# Patient Record
Sex: Female | Born: 1980 | Race: White | Hispanic: No | Marital: Single | Smoking: Never smoker
Health system: Western US, Academic
[De-identification: ages and names within clinical notes are randomized; demographics above are authoritative.]

## PROBLEM LIST (undated history)

## (undated) ENCOUNTER — Ambulatory Visit (HOSPITAL_BASED_OUTPATIENT_CLINIC_OR_DEPARTMENT_OTHER): Payer: Medicaid Other | Admitting: Anesthesiology

## (undated) ENCOUNTER — Ambulatory Visit (AMBULATORY_SURGERY_CENTER): Payer: Self-pay | Admitting: Physical Medicine and Rehab

## (undated) ENCOUNTER — Encounter (HOSPITAL_BASED_OUTPATIENT_CLINIC_OR_DEPARTMENT_OTHER): Payer: Self-pay

## (undated) ENCOUNTER — Inpatient Hospital Stay (HOSPITAL_COMMUNITY): Payer: Self-pay | Admitting: Orthopedics

## (undated) ENCOUNTER — Encounter (HOSPITAL_COMMUNITY): Payer: Self-pay

## (undated) DIAGNOSIS — S42009A Fracture of unspecified part of unspecified clavicle, initial encounter for closed fracture: Secondary | ICD-10-CM

## (undated) DIAGNOSIS — F988 Other specified behavioral and emotional disorders with onset usually occurring in childhood and adolescence: Secondary | ICD-10-CM

## (undated) HISTORY — DX: Fracture of unspecified part of unspecified clavicle, initial encounter for closed fracture: S42.009A

## (undated) HISTORY — PX: SPINE SURGERY: SHX786

## (undated) HISTORY — DX: Other specified behavioral and emotional disorders with onset usually occurring in childhood and adolescence: F98.8

## (undated) SURGERY — PAIN INJECTION, SACROILIAC JOINT
Laterality: Bilateral

## (undated) SURGERY — REMOVAL, HARDWARE, BACK
Anesthesia: General | Site: Spine Lumbar

## (undated) MED ORDER — LACTATED RINGERS IV SOLN
INTRAVENOUS | Status: AC
Start: 2019-10-26 — End: ?

## (undated) MED ORDER — HYDROCODONE-ACETAMINOPHEN 5-325 MG OR TABS
1.00 | ORAL_TABLET | Freq: Four times a day (QID) | ORAL | 0 refills | Status: AC | PRN
Start: 2018-04-14 — End: ?

## (undated) MED ORDER — OXYCODONE HCL 5 MG OR TABS
5.0000 mg | ORAL_TABLET | ORAL | Status: AC | PRN
Start: 2019-11-03 — End: ?

## (undated) MED ORDER — ACETAMINOPHEN 325 MG PO TABS
975.00 mg | ORAL_TABLET | Freq: Once | ORAL | Status: AC
Start: 2018-12-30 — End: 2018-12-30

## (undated) MED ORDER — HYDROMORPHONE HCL 1 MG/ML IJ SOLN
1.0000 mg | INTRAMUSCULAR | Status: AC | PRN
Start: 2019-11-03 — End: 2019-11-03

## (undated) MED ORDER — LIDOCAINE HCL (PF) 1 % IJ SOLN
0.1000 mL | INTRAMUSCULAR | Status: AC | PRN
Start: 2019-10-26 — End: ?

## (undated) MED ORDER — LIDOCAINE HCL (PF) 1 % IJ SOLN
0.10 mL | INTRAMUSCULAR | Status: AC | PRN
Start: 2018-12-30 — End: ?

## (undated) MED ORDER — OXYCODONE-ACETAMINOPHEN 5-325 MG OR TABS
1.00 | ORAL_TABLET | Freq: Four times a day (QID) | ORAL | 0 refills | Status: AC | PRN
Start: 2018-08-16 — End: ?

## (undated) MED ORDER — BUPROPION XL (DAILY) 150 MG OR TB24
ORAL_TABLET | ORAL | 3 refills | Status: AC
Start: 2019-10-24 — End: ?

## (undated) MED ORDER — MUPIROCIN 2 % EX OINT
1.0000 | TOPICAL_OINTMENT | Freq: Two times a day (BID) | CUTANEOUS | 0 refills | Status: AC
Start: 2019-10-31 — End: ?

## (undated) MED ORDER — CHLORHEXIDINE GLUCONATE 4 % EX LIQD (CUSTOM)
CUTANEOUS | 0 refills | Status: AC
Start: 2019-10-31 — End: ?

## (undated) MED ORDER — HYDROMORPHONE HCL 1 MG/ML IJ SOLN
0.5000 mg | INTRAMUSCULAR | Status: AC | PRN
Start: 2019-11-03 — End: 2019-11-03

## (undated) MED ORDER — OXYCODONE HCL 5 MG OR TABS
10.0000 mg | ORAL_TABLET | ORAL | Status: AC | PRN
Start: 2019-11-03 — End: ?

## (undated) MED ORDER — MELOXICAM 15 MG OR TABS
15.00 mg | ORAL_TABLET | Freq: Every day | ORAL | 0 refills | Status: AC
Start: 2018-05-18 — End: ?

## (undated) MED ORDER — TRIAMCINOLONE ACETONIDE 40 MG/ML IJ SUSP
4.00 mg | Freq: Once | INTRAMUSCULAR | Status: AC
Start: 2018-03-28 — End: 2018-03-28

## (undated) MED ORDER — TRAMADOL HCL 50 MG OR TABS
50.00 mg | ORAL_TABLET | Freq: Four times a day (QID) | ORAL | 0 refills | Status: AC | PRN
Start: 2018-04-12 — End: ?

## (undated) MED ORDER — BUPIVACAINE HCL (PF) 0.5 % IJ SOLN
1.00 mL | Freq: Once | INTRAMUSCULAR | Status: AC
Start: 2018-03-28 — End: 2018-03-28

## (undated) MED ORDER — ACETAMINOPHEN 325 MG PO TABS
975.0000 mg | ORAL_TABLET | Freq: Once | ORAL | Status: AC
Start: 2019-10-26 — End: 2019-10-26

## (undated) MED ORDER — TRAMADOL HCL 50 MG OR TABS
50.00 mg | ORAL_TABLET | Freq: Four times a day (QID) | ORAL | 0 refills | Status: AC | PRN
Start: 2018-03-31 — End: ?

## (undated) MED ORDER — OXYCODONE-ACETAMINOPHEN 5-325 MG OR TABS
1.00 | ORAL_TABLET | Freq: Four times a day (QID) | ORAL | 0 refills | Status: AC | PRN
Start: 2018-08-15 — End: ?

---
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
## 2013-04-06 ENCOUNTER — Encounter (INDEPENDENT_AMBULATORY_CARE_PROVIDER_SITE_OTHER): Payer: Self-pay | Admitting: Family Practice

## 2013-04-06 ENCOUNTER — Ambulatory Visit (INDEPENDENT_AMBULATORY_CARE_PROVIDER_SITE_OTHER): Payer: BLUE CROSS/BLUE SHIELD | Admitting: Family Practice

## 2013-04-06 VITALS — BP 112/75 | HR 73 | Temp 97.5°F | Resp 20 | Wt 164.0 lb

## 2013-04-06 MED ORDER — DROSPIRENONE-ETHINYL ESTRADIOL 3-0.02 MG OR TABS
1.0000 | ORAL_TABLET | Freq: Every day | ORAL | Status: DC
Start: 2013-04-06 — End: 2013-04-07

## 2013-04-06 NOTE — Progress Notes (Signed)
Chief Complaint   Patient presents with   . Refill Request     discuss birth control       Subjective:   32 yo female here for refill of OCP currently on yaz.     No blood clotting d/o, no hx of blood clots, no HA, no htn.      Has been on yaz for years, tried to switched but had side effects aware of increased risk of blood clots with yaz.       HCM:  Pap 2 years ago, no abnormals  tdap 2 years ago      PMH  ADD- started on Adderall      Review of Systems -   General - no fevers, chills, unintentional wt loss  Kidney - no changes in urination, blood in urine, pain with urination  Lungs - no cough, shortness of breath, bloody cough  Stomach - no bloody or black stools, abdominal pain, vomiting, diarrhea  Neurologic - no weakness, tingling, problems walking, fainting  Vascular - no leg pain with walking, painful hands/digits  Oral - no bleeding from gums, open sores  Heart - no chest pain, irregular heart beat  Psychiatric - no depression, behavior changes  Muscle - no joint pain, leg swelling  Ear/Nose/Throat - no nose bleeds  Skin - no rashes  Blood - no bleeding, bruising  Endocrine - no feeling hot or cold    No future appointments.    Past Medical History   Diagnosis Date   . Clavicular fracture    . ADD (attention deficit disorder)      History     Social History   . Marital Status: Single     Spouse Name: N/A     Number of Children: N/A   . Years of Education: N/A     Social History Main Topics   . Smoking status: Never Smoker    . Smokeless tobacco: Never Used   . Alcohol Use: Not on file   . Drug Use: Not on file   . Sexual Activity: Not Currently     Partners: Male     Other Topics Concern   . Not on file     Social History Narrative    Single    School fulltime-psychotherapy degree     Tour guide-food and history tours     Hobbies: beach volleyball, horse back riding, exercise      Family History   Problem Relation Age of Onset   . Diabetes Paternal Uncle    . Colon Cancer Neg Hx    . Breast Cancer  Paternal Aunt    . Breast Cancer Mother 28   . Hypertension Neg Hx    . Cholesterol/Lipid Disorder Neg Hx      Current Outpatient Prescriptions   Medication Sig   . drospirenone-ethinyl estradiol (YAZ) 3-0.02 MG per tablet Take 1 tablet by mouth daily.     No current facility-administered medications for this visit.       There is no immunization history on file for this patient.  No Known Allergies    OBJECTIVE  Filed Vitals:    04/06/13 0958   BP: 112/75   Pulse: 73   Temp: 97.5 F (36.4 C)   TempSrc: Oral   Resp: 20   Weight: 74.39 kg (164 lb)     There is no height on file to calculate BMI.  Physical Exam: General Appearance: healthy, alert, no distress, pleasant affect, cooperative.  Heart:  normal rate and regular rhythm, no murmurs, clicks, or gallops.  Lungs: normal respiratory rate and rhythm, lungs clear to auscultation.  Extremities:  no cyanosis, clubbing, or edema.      Makinze was seen today for refill request.    Diagnoses and associated orders for this visit:    Birth control-reviewed increased risk of blood clots with yaz, patient wishes to continue, Pt is a non-smoker, does not have elevated blood pressure, or personal/family history of coagulopathy, and is an appropriate candidate for hormonal contraception. Risks of OCP reviewed with patient - including HTN, stroke, and blood clot.   - Mychart Access Code Procedure  - drospirenone-ethinyl estradiol (YAZ) 3-0.02 MG per tablet; Take 1 tablet by mouth daily.    ADD (attention deficit disorder)-started on adderall, managed by outside dr.           Napoleon Form, MD    Barriers to learning none    Patient verbalizes understanding and agrees to plan

## 2013-04-06 NOTE — Interdisciplinary (Signed)
Pre-visit chart review and huddle completed with staff and physician.    Outstanding labs, imaging and consults reviewed and identified.    Health maintanence issues identified and addressed:    Health Maintenance   Topic Date Due    Imm_td/tdap=>32 Yo  12/29/1991    Cervical Cancer Screening  12/28/2001    Influenza Vaccine  06/07/2013

## 2013-04-07 ENCOUNTER — Encounter (INDEPENDENT_AMBULATORY_CARE_PROVIDER_SITE_OTHER): Payer: Self-pay | Admitting: Family Practice

## 2013-04-07 MED ORDER — DROSPIRENONE-ETHINYL ESTRADIOL 3-0.02 MG OR TABS
1.0000 | ORAL_TABLET | Freq: Every day | ORAL | Status: DC
Start: 2013-04-07 — End: 2015-12-17

## 2013-04-07 NOTE — Telephone Encounter (Signed)
From: [name]  To: Campbell Lerner, MD  Sent: 04/07/2013 10:28 AM PDT  Subject: 1-Non Urgent Medical Advice    Hi,    I do need to have my birth control pill as the generic otherwise it's very expensive. Could you change my prescription to allow for the generic?    Thanks!  Haley Barrett

## 2013-04-07 NOTE — Telephone Encounter (Signed)
Script re-pended with note to Pharmacy to dispense generic.

## 2013-04-12 ENCOUNTER — Encounter (INDEPENDENT_AMBULATORY_CARE_PROVIDER_SITE_OTHER): Payer: Self-pay | Admitting: Family Practice

## 2013-05-24 ENCOUNTER — Encounter (INDEPENDENT_AMBULATORY_CARE_PROVIDER_SITE_OTHER): Payer: BLUE CROSS/BLUE SHIELD | Admitting: Family Medicine

## 2013-06-15 ENCOUNTER — Other Ambulatory Visit: Payer: BLUE CROSS/BLUE SHIELD | Attending: Family Practice | Admitting: Family Medicine

## 2013-06-15 ENCOUNTER — Encounter (INDEPENDENT_AMBULATORY_CARE_PROVIDER_SITE_OTHER): Payer: Self-pay | Admitting: Family Medicine

## 2013-06-15 VITALS — BP 131/78 | HR 71 | Temp 97.3°F | Resp 14 | Ht 69.5 in | Wt 159.8 lb

## 2013-06-15 DIAGNOSIS — Z23 Encounter for immunization: Secondary | ICD-10-CM | POA: Insufficient documentation

## 2013-06-15 DIAGNOSIS — Z124 Encounter for screening for malignant neoplasm of cervix: Secondary | ICD-10-CM | POA: Insufficient documentation

## 2013-06-15 DIAGNOSIS — Z113 Encounter for screening for infections with a predominantly sexual mode of transmission: Secondary | ICD-10-CM | POA: Insufficient documentation

## 2013-06-15 NOTE — Progress Notes (Signed)
 Haley Barrett is a 32 year old female presents for WWE    HPI:  LMP: No LMP recorded. Patient is not currently having periods (Reason: Oral contraception). Spots during placebo week.  Menstrual history includes menarche 12 years. Has been on OCPs since age 65, hasn't had a normal period in several years due to OCPs.  Last pap smear: 2 year(s), and it was normal, she has had no procedures.  She is sexually active.    She is in a heterosexual relationship; with current partner for 2-3 months.  Number of lifetime sexual partners 10-15.    Past history of any  STDs yes - chlamydia--dx'd 1 month ago, she and partner were treated. Present form of contraception is oral contraceptives.    Health Concerns:  Was recently diagnosed with chlamydia about a month ago at [facility], was treated (and so was her partner) but she thinks she had sex earlier than she was told it was OK. Would like to be retested today.     Diet: eats chicken and [place] but mostly vegetarian, plenty of fruits/veggies, avoid sweets/soda/fatty snacks  Exercise: does zumba and weightlifting several times per week    Review of Systems -   Denies fever/sweats/chills, visual changes, headache, unexplained weight changes, chest pain, shortness of breath, nausea/vomiting/diarrhea/constipation, abdominal pain, dysuria, hematuria, vaginal discharge, numbness/paresthesias in limbs, hot flashes, headaches, peripheral edema, personal or family history of blood clots.     I did review available medical, surgical, obstetrical and social history:    Outpatient Prescriptions Prior to Visit   Medication Sig Dispense Refill   . drospirenone -ethinyl estradiol  (YAZ) 3-0.02 MG per tablet Take 1 tablet by mouth daily.  84 tablet  4     No facility-administered medications prior to visit.     History:  Patient Active Problem List   Diagnosis   . ADD (attention deficit disorder)   . Birth control     Current Outpatient Prescriptions on File Prior to Visit   Medication  Sig Dispense Refill   . drospirenone -ethinyl estradiol  (YAZ) 3-0.02 MG per tablet Take 1 tablet by mouth daily.  84 tablet  4     No current facility-administered medications on file prior to visit.     No Known Allergies  Family History   Problem Relation Age of Onset   . Diabetes Paternal Uncle    . Colon Cancer Neg Hx    . Breast Cancer Paternal Aunt    . Breast Cancer Mother 42   . Hypertension Neg Hx    . Cholesterol/Lipid Disorder Neg Hx      History     Social History   . Marital Status: Single     Spouse Name: N/A     Number of Children: N/A   . Years of Education: N/A     Occupational History   . Not on file.     Social History Main Topics   . Smoking status: Never Smoker    . Smokeless tobacco: Never Used   . Alcohol Use: Not on file   . Drug Use: Not on file   . Sexual Activity: Not Currently     Partners: Male     Other Topics Concern   . Not on file     Social History Narrative    Single    School fulltime-psychotherapy degree     Tour guide-food and history tours     Hobbies: beach volleyball, horse back riding, exercise  Objective:    BP 131/78  Pulse 71  Temp(Src) 97.3 F (36.3 C) (Oral)  Resp 14  Ht 5' 9.5" (1.765 m)  Wt 72.485 kg (159 lb 12.8 oz)  BMI 23.27 kg/m2    Body mass index is 23.27 kg/(m^2).    General: well Developed, well nourished female in no acute distress, mood & affect are normal  Head: normocephalic, atraumatic  Eyes: anicteric, conj and lids normal OU  Ears: External Canals normal and TM's normal AU  Nose: Mucus membranes normal  Oro: Uvula midline, tongue, gums, posterior pharynx & tonsils normal  Neck : supple, no anterior or posterior adenopathy, thyroid  exam without nodules or asymmetry, not enlarged  CV: S1S2 rate noted, regular, no murmur, no S3 or S4 gallop noted  Lungs: RR noted, able to speak in complete sentences, no respiratory distress, breath sounds symmetrical and normal bilaterally with good air exchange  Breasts: No Axillary, supra- or infraclavicular  adenopathy bilaterally, breasts, nipples & areolas without dominant masses, nipple discharge or skin changes. UOQ's with fibrocystic features  Abd:  Normoactive bowel sounds, soft, non-tender to palpation, no organomegaly, no guarding, no rebound  Pelvic: pap obtained, cultures done  Vulva/Vagina: normal  Cervix: normal in appearance  Uterus: normal sized  Adnexa: no adnexal masses or tenderness  Rectal Exam: not performed  MSkel:   Back: No spinal tenderness, no flank, or CVA tenderness  Neurological exam reveals normal without focal findings, mental status, speech normal, alert and oriented x iii, PERLA.  Skin: exposed areas free of rash and no significant lesions  Extr: no cyanosis, clubbing, or edema    Assessment/Plan:   Haley Barrett was seen today for physical.    Diagnoses and associated orders for this visit:    Screening for cervical cancer  - Cytopath Gyn (Pap Smear)    Routine screening for STI (sexually transmitted infection)  - Chlamydia/GC PCR, Genital Culturette    Influenza vaccine needed  - Flu Vaccine, IM >=3 Years      Patient was seen by and discussed with attending, Dr. Mirian Barrett    Return to clinic 1 year for [facility] or sooner prn.     Haley Brink, DO   [facility] PGY2

## 2013-06-15 NOTE — Interdisciplinary (Signed)
 Pre-visit chart review and huddle completed with staff and physician.    Outstanding labs, imaging and consults reviewed and identified.    Health maintanence issues identified and addressed:    Health Maintenance   Topic Date Due   . Cervical Cancer Screening  12/28/2001   . Influenza Vaccine  05/08/2013   . Imm_td/tdap=>32 Yo  03/07/2021       Patient denies any previous medication or vaccine reactions. After 10 minutes monitoring from medication administration, patient tolerated Flu vaccine well and left without incident. Vaccine information sheet provided to patient.

## 2013-06-15 NOTE — Progress Notes (Signed)
 Attending Note:    Subjective:  I reviewed the history.  Patient interviewed and examined.  History of present illness (HPI):  Physical     Review of Systems (ROS): As per  the resident's note.  Past Medical, Family, Social History:  As per  the resident's  note.    Objective:   I have examined the patient and I concur with the resident's exam.    Assessment and plan reviewed with the resident physician.  I agree with the resident's plan as documented.    See the resident's note for further details.

## 2013-06-17 NOTE — Procedures (Signed)
 SPECIMENS SUBMITTED:  Cervical (Pap) Smear;Thin-Prep Vial Received    CLINICAL INFORMATION:   No LMP Given; Other Clinical Findings: Screening  FINAL CYTOLOGIC INTERPRETATION:  No Atypical or Malignant Cells    COMMENT:  HPV Subtyping will be performed on excess Thin-Prep collection fluid from  the specimen vial.  Result will be reported in EPIC separately.    SPECIMEN ADEQUACY:  Satisfactory for evaluation  The Pap smear is a screening test with an inherent low error rate. Although  a normal [negative] result is highly predictive of the absence of cervical  cancer and its precursor lesions, it does not exclude the presence of  significant disease. Results must be evaluated within the context of the  individual patient.  CONFIDENTIAL HEALTH INFORMATION: Health Care information is personal and  sensitive information. If it is being faxed to you it is done so under  appropriate authorization from the patient or under circumstances that do  not require patient authorization. You, the recipient, are obligated to  maintain it in a safe, secure and confidential manner. Re-disclosure  without additional patient consent or as permitted by law is prohibited.  Unauthorized re-disclosure or failure to maintain confidentiality could  subject you to penalties described in federal and state law.  If you have  received this report or facsimile in error, please notify the [facility] immediately and destroy the received document(s).    Material reviewed and Interpreted and  Report Electronically Signed by:  Lawernce Presto CT (ASCP)  06/17/13 08:48  Electronic Signature derived from a single  controlled access password

## 2013-07-28 ENCOUNTER — Encounter (INDEPENDENT_AMBULATORY_CARE_PROVIDER_SITE_OTHER): Payer: Self-pay | Admitting: Family Practice

## 2014-03-28 ENCOUNTER — Encounter (INDEPENDENT_AMBULATORY_CARE_PROVIDER_SITE_OTHER): Payer: Self-pay | Admitting: Family Practice

## 2014-03-28 DIAGNOSIS — Z0000 Encounter for general adult medical examination without abnormal findings: Principal | ICD-10-CM

## 2015-12-02 ENCOUNTER — Other Ambulatory Visit: Payer: Self-pay

## 2015-12-05 ENCOUNTER — Telehealth (INDEPENDENT_AMBULATORY_CARE_PROVIDER_SITE_OTHER): Payer: Self-pay

## 2015-12-05 NOTE — Telephone Encounter (Signed)
Verbally confirmed name of Primary Care Provider: No PCP    New Referral request is to what department? Orthopedics  What is the reason for referral request? Right pinky finger broken, [facility], radiology report to be updated by [facility] Synetta Fail(Anita)  Last visit Date: none  Next visit Date: 12/17/15    Patient to establish care with Dr. Threasa BeardsYang on 12/17/15. Please advise.    If patient has not been seen in over a year please secure an appointment before forwarding message to nurse

## 2015-12-06 NOTE — Telephone Encounter (Signed)
[place], can you see if pt. Can be seen sooner for broken finger to be assessed for Referral to Orthopedics.

## 2015-12-10 NOTE — Telephone Encounter (Signed)
Called patient to offer sooner appointment on 12/12/15 at 1:40 PM with Dr. Glade LloydPandey.  Patient was unavailable at the time of call, detailed voice message was left with call back number for scheduling purposes.

## 2015-12-11 NOTE — Telephone Encounter (Signed)
Called patient to inform that sooner appointment on 12/12/15 at 1:40 PM with Dr. Glade LloydPandey was no longer available. Patient was unavailable at the time of call, detailed voice message was left with call back number for any questions she may have. Patient is currently scheduled with Dr. Threasa BeardsYang for 12/17/15 (patient aware).

## 2015-12-12 ENCOUNTER — Ambulatory Visit (INDEPENDENT_AMBULATORY_CARE_PROVIDER_SITE_OTHER): Payer: Medicaid Other | Admitting: Hospitalist

## 2015-12-17 ENCOUNTER — Encounter (INDEPENDENT_AMBULATORY_CARE_PROVIDER_SITE_OTHER): Payer: Self-pay | Admitting: Internal Medicine

## 2015-12-17 ENCOUNTER — Ambulatory Visit (INDEPENDENT_AMBULATORY_CARE_PROVIDER_SITE_OTHER): Payer: Medicaid Other | Admitting: Internal Medicine

## 2015-12-17 ENCOUNTER — Other Ambulatory Visit: Payer: Medicaid Other | Attending: Internal Medicine | Admitting: Internal Medicine

## 2015-12-17 VITALS — BP 121/71 | HR 74 | Temp 97.3°F | Ht 70.0 in | Wt 162.2 lb

## 2015-12-17 DIAGNOSIS — Z113 Encounter for screening for infections with a predominantly sexual mode of transmission: Principal | ICD-10-CM

## 2015-12-17 DIAGNOSIS — S62609A Fracture of unspecified phalanx of unspecified finger, initial encounter for closed fracture: Secondary | ICD-10-CM

## 2015-12-17 DIAGNOSIS — Z0000 Encounter for general adult medical examination without abnormal findings: Secondary | ICD-10-CM

## 2015-12-17 DIAGNOSIS — Z0189 Encounter for other specified special examinations: Secondary | ICD-10-CM

## 2015-12-17 DIAGNOSIS — Z309 Encounter for contraceptive management, unspecified: Secondary | ICD-10-CM

## 2015-12-17 LAB — LIPID(CHOL FRACT) PANEL, BLOOD
Cholesterol: 225 mg/dL — ABNORMAL HIGH (ref <200)
HDL-Cholesterol: 104 mg/dL
LDL-Chol (Calc): 99 mg/dL (ref <160)
Non-HDL Cholesterol: 121 mg/dL
Triglycerides: 112 mg/dL (ref 10–170)

## 2015-12-17 LAB — COMPREHENSIVE METABOLIC PANEL, BLOOD
ALT (SGPT): 30 U/L (ref 0–33)
AST (SGOT): 29 U/L (ref 0–32)
Albumin: 4.4 g/dL (ref 3.5–5.2)
Alkaline Phos: 62 U/L (ref 35–140)
Anion Gap: 19 mmol/L — ABNORMAL HIGH (ref 7–15)
BUN: 13 mg/dL (ref 6–20)
Bicarbonate: 21 mmol/L — ABNORMAL LOW (ref 22–29)
Bilirubin, Tot: 0.28 mg/dL (ref <1.20)
Calcium: 9.4 mg/dL (ref 8.5–10.6)
Chloride: 99 mmol/L (ref 98–107)
Creatinine: 0.83 mg/dL (ref 0.51–0.95)
GFR: >60 mL/min
Glucose: 109 mg/dL — ABNORMAL HIGH (ref 70–99)
Potassium: 3.9 mmol/L (ref 3.5–5.1)
Sodium: 139 mmol/L (ref 136–145)
Total Protein: 7.7 g/dL (ref 6.0–8.0)

## 2015-12-17 LAB — CBC WITH DIFF, BLOOD
ANC-Automated: 4.4 10*3/uL (ref 1.6–7.0)
Abs Eosinophils: 0.1 10*3/uL (ref 0.1–0.5)
Abs Lymphs: 3.1 10*3/uL (ref 0.8–3.1)
Abs Monos: 0.4 10*3/uL (ref 0.2–0.8)
Eosinophils: 1 %
Hct: 41.7 % (ref 34.0–45.0)
Hgb: 13.9 gm/dL (ref 11.2–15.7)
Lymphocytes: 39 %
MCH: 30.5 pg (ref 26.0–32.0)
MCHC: 33.3 % (ref 32.0–36.0)
MCV: 91.4 um3 (ref 79.0–95.0)
MPV: 9.2 fL — ABNORMAL LOW (ref 9.4–12.4)
Monocytes: 5 %
Plt Count: 316 10*3/uL (ref 140–370)
RBC: 4.56 10*6/uL (ref 3.90–5.20)
RDW: 12.4 % (ref 12.0–14.0)
Segs: 55 %
WBC: 8 10*3/uL (ref 4.0–10.0)

## 2015-12-17 LAB — GLYCOSYLATED HGB(A1C), BLOOD: Glyco Hgb (A1C): 5.1 % (ref 4.8–5.9)

## 2015-12-17 MED ORDER — ADDERALL PO
ORAL | Status: DC | PRN
Start: ? — End: 2019-10-26

## 2015-12-17 MED ORDER — AMBIEN PO
ORAL | Status: DC | PRN
Start: ? — End: 2017-03-01

## 2015-12-17 MED ORDER — DROSPIRENONE-ETHINYL ESTRADIOL 3-0.02 MG OR TABS
1.0000 | ORAL_TABLET | Freq: Every day | ORAL | 4 refills | Status: DC
Start: 2015-12-17 — End: 2016-05-05

## 2015-12-17 NOTE — Progress Notes (Signed)
ATTENDING NOTE:    Chief Complaint   Patient presents with   . New Patient   . Fracture     Right pinky       SUBJECTIVE:   I reviewed the history and interviewed the patient.    History of present illness:  Jackelyn HoehnMegan Vandervliet is a 35 year old female who presents to establish care. Recent right pinky finger injury during volleyball with resultant volar place fx involving right 5th middle phalanx.      Review of Systems: As per resident's note.    Past Medical, Family, Social History: Reviewed and updated as per resident note.    OBJECTIVE: BP 121/71  Pulse 74  Temp 97.3 F (36.3 C) (Oral)  Ht 5\' 10"  (1.778 m)  Wt 73.6 kg (162 lb 4 oz)  LMP 12/14/2015 (Approximate)  SpO2 100%  BMI 23.28 kg/m2    I have examined the patient and I concur with the resident's exam.    MEDICAL PLAN of CARE:   Assessment and plan reviewed with the resident physician.  I agree with the resident's plans as documented.    Ortho referral placed.   Screening labs ordered.   Pap due 2019.   Pt to check on Tdap status.     See the resident's note for further details.

## 2015-12-17 NOTE — Progress Notes (Signed)
Chief Complaint   Patient presents with   . New Patient   . Fracture     Right pinky         SUBJECTIVE::Haley Barrett is a 35 year old female with prior history of ADD here to establish care.      Was playing volleyball two weeks ago and injured her right pinky finger. Went to [facility] ED and had x-rays done which showed volar plate fracture with displacement involving the base of the fifth middle phalanx. She was told she would need to follow up with orthopedics and was referred to [place], but had trouble with her insurance getting an appointment, and needed to establish PCP first. Has been wearing splint for past two weeks. Initially, was very bruised, swollen and painful. Has improved over the last two weeks but still slightly discolored and painful.     No other complaints.     She has history of 11 broken bones, mostly from horse riding injuries. She previously was riding horses frequently and now only rides once a week. Currently, has not been riding horses after she fractured her pinky.     She would like STD testing today. Last had unprotected sex in February but has since broken up with that partner. Likes to be tested for STDs prior to getting a new partner. No symptoms. Currently on the pill.     [facility]:  Pap - last pap 2014. No history of abnormal.   tdap - 2012    Patient Active Problem List    Diagnosis Date Noted   . ADD (attention deficit disorder) 04/06/2013   . Birth control 04/06/2013       Past medical history, surgical history, and family history reviewed and updated today as below.  Allergies and medications reviewed and updated as below.    Past Medical History:   Diagnosis Date   . ADD (attention deficit disorder)    . Clavicular fracture        Past Surgical History:   Procedure Laterality Date   . ------------OTHER-------------      humerus ORIF        Family History   Problem Relation Age of Onset   . Diabetes Paternal Uncle    . Breast Cancer Paternal Aunt    . Breast Cancer Mother  61   . Breast Cancer Paternal Grandmother    . Colon Cancer Neg Hx    . Hypertension Neg Hx    . Cholesterol/Lipid Disorder Neg Hx        Social History   Substance Use Topics   . Smoking status: Never Smoker   . Smokeless tobacco: Never Used   . 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    Born in [place] and moved to [place] 6 years ago    Tour guide-food and history tours     Hobbies: beach volleyball, horse back riding, exercise      Obstetric History    G0   P0   T0   P0   A0   TAB0   SAB0   E0   M0   L0        No Known Allergies    Current Outpatient Prescriptions   Medication Sig   .  Amphetamine-Dextroamphetamine (ADDERALL PO) as needed.   . drospirenone-ethinyl estradiol (YAZ) 3-0.02 MG tablet Take 1 tablet by mouth daily.   . Zolpidem Tartrate (AMBIEN PO) as needed.     No current facility-administered medications for this visit.        ROS: Written review of systems reviewed with patient. Positive for findings noted below in bold:  Constitutional: negative for: fatigue, night sweats, weight loss, loss of energy, anorexia, fever.   Eyes: negative for: blurry vision, visual loss, red eyes, eye pain.   ENMT: negative for: hearing loss, ear pain, tinnitus, dental problems, odynophagia, change in voice or hoarseness  CV: negative for: palpitations, chest pain, paroxysmal nocturnal dyspnea, orthopnea, lower extremity edema, pain in calves with walking   Resp: negative for: cough, shortness of breath, pleuritic pain, wheezing, DOE  GI: negative for: heartburn, nausea, vomiting, abdominal pain, constipation, diarrhea, jaundice, change in stool caliber, rectal bleeding or black stools  Endo: negative for: cold intolerance, heat intolerance, polyphagia, polydipsia, polyuria.  GU: negative for: nocturia, frequency, dysuria, urgency, hesitancy,  hematuria, incontinence   Breast/GYN: negative for: vaginal discharge, pain with sex, loss of sexual desire, spotting between periods, excessive menstruation, nipple bleeding or discharge, tenderness in breast, menopausal symptoms  Musculoskeletal: negative for: back pain, muscle pain, joint pain, joint swelling, joint redness   Neuro: negative for: confusion, headaches, seizures, lightheadedness, memory loss, vertigo, paralysis/weakness, clumsiness, tremor, speech impairment.   Psych: negative for: anhedonia, depressed mood, crying spells, anxiety, trouble sleeping, hallucinations, impaired concentration, stress  Allergy/Immun: negative for: stuffy nose and sinuses, itchy/red/tearing eyes  Skin: no new skin rashes or lesions    OBJECTIVE:: BP 121/71  Pulse 74  Temp 97.3 F (36.3 C) (Oral)  Ht 5\' 10"  (1.778 m)  Wt 73.6 kg (162 lb 4 oz)  SpO2 100%  BMI 23.28 kg/m2  Physical Exam:   Gen: NAD, A&Ox3, tan, thin  HEENT: EOMI, PERRL, MMM, OP clear without erythema or exudates, no cervical LAD  CV: RRR, no m/r/g  Pulm: CTAB, no w/r/r  Abd: Soft, ND, NT, +BS, no masses, no organomegaly  Ext: WWP, no edema. Scar on L arm from prior surgery. R pinky finger in splint, TTP along medial edge, bluish discoloration at base of pinky on palmar side  Skin: No obvious lesions, rash  Neuro: CN II-XII grossly intact, strength/sensation grossly intact      Lab and imaging data:  Right hand x-ray 12/02/15 - [facility]  Mild comminuted and mildly displaced volar plate fracture involving the base of the fifth middle phalanx. No additional fracture or subluxation.       A/P: Haley MilletMegan was seen today for new patient, fracture and collect specimen.    Diagnoses and all orders for this visit:    Finger fracture, right, closed, initial encounter - Has mildly displaced volar plate fracture involving base of fifth middle phalanx. Has been improving with splinting/immobilization of finger, but will refer to orthopedics for further  evaluation. Will need repeat x-rays to ensure proper healing.   -     Orthopedics Clinic    Routine screening for STI (sexually transmitted infection)  -     Chlamydia/GC PCR, Urine Roche PCR Collection SystemTube; Future  -     HIV 1/2 Antibody Yellow serum separator tube; Future  -     Syphilis Screen, Blood Yellow serum separator tube; Future    Encounter for routine laboratory testing  -     CBC w/Diff Lavender; Future  -  Comprehensive Metabolic Panel Green Plasma Separator Tube; Future  -     Lipid Panel Green Plasma Separator Tube; Future  -     Glycosylated Hgb(A1C), Blood Lavender; Future    Encounter for contraceptive management, unspecified contraceptive encounter type  -     drospirenone-ethinyl estradiol (YAZ) 3-0.02 MG tablet; Take 1 tablet by mouth daily.      [facility]   -Flu shot: n/a  -Tetanus vaccine: 2012  -Pneumonia vaccine: n/a  -Shingles vaccine:n/a  -Pap smear: due 2019  -Mammogram: n/a  -colon cancer screening: n/a  -DEXA: n/a  -HPV vaccine: n/a  -Lung cancer screening:n/a    Pt was seen and discussed with attending, Dr. Renaldo Reel.  Follow-up in 1 year.    Deno Lunger MD, PGY2

## 2015-12-17 NOTE — Patient Instructions (Signed)
It was a pleasure meeting you today! Please contact me if you have any questions. The clinic number is [phone].    Here are a few of the topics that we discussed:    1. Get labs done  2. Schedule appointment with orthopedics    Please have your labs done on the 4th floor.  The lab is open:   Monday, Tuesday, Friday 7:45-4:45 PM   Wednesday, Thursday 7:30-4:45 PM  *For cholesterol panel, please be fasting (no food at least 8 hours prior, but water is ok).      Radiology is open weekdays until 4:30 PM.    ** If you are concerned about insurance coverage, costs and/or deductible, prior to doing the labs and/or tests please contact your insurance company and/or the lab. If you have high deductible or self-pay, then consider outside [facility] [place] labs like [facility] and [facility] to compare costs.    I look forward to working with you again.    Take care,  Deno LungerJenny Javell Blackburn, MD

## 2015-12-18 LAB — SYPHILIS EIA SCREEN, BLOOD: Syphilis EIA Screen: NEGATIVE

## 2015-12-18 LAB — HIV 1/2 ANTIBODY & P24 ANTIGEN ASSAY, BLOOD: HIV 1/2 Antibody & P24 Antigen Assay: NONREACTIVE

## 2015-12-22 ENCOUNTER — Encounter (INDEPENDENT_AMBULATORY_CARE_PROVIDER_SITE_OTHER): Payer: Self-pay | Admitting: Orthopaedic Surgery

## 2015-12-22 DIAGNOSIS — M79644 Pain in right finger(s): Principal | ICD-10-CM

## 2015-12-23 ENCOUNTER — Ambulatory Visit (INDEPENDENT_AMBULATORY_CARE_PROVIDER_SITE_OTHER): Payer: Medicaid Other | Admitting: Orthopaedic Surgery

## 2015-12-23 ENCOUNTER — Inpatient Hospital Stay (INDEPENDENT_AMBULATORY_CARE_PROVIDER_SITE_OTHER): Admit: 2015-12-23 | Discharge: 2015-12-23 | Disposition: A | Discharge status: Home / Self Care | Payer: Medicaid Other

## 2015-12-23 ENCOUNTER — Encounter (INDEPENDENT_AMBULATORY_CARE_PROVIDER_SITE_OTHER): Payer: Self-pay | Admitting: Orthopaedic Surgery

## 2015-12-23 VITALS — BP 118/76 | HR 72 | Temp 97.5°F

## 2015-12-23 DIAGNOSIS — S62626D Displaced fracture of medial phalanx of right little finger, subsequent encounter for fracture with routine healing: Secondary | ICD-10-CM

## 2015-12-23 DIAGNOSIS — S63259D Unspecified dislocation of unspecified finger, subsequent encounter: Principal | ICD-10-CM

## 2015-12-23 NOTE — Progress Notes (Signed)
Department of Orthopaedic Surgery  Division of Hand and Microvascular Surgery     HISTORY OF PRESENT ILLNESS  Haley Barrett is a 35 year old right-handed  woman who 3 weeks ago was playing volleyball and jammed her right small finger sustaining a PIP joint dislocation.  She was initially seen at [facility].  X-rays demonstrated a volar plate injury off of the proximal volar base of the middle phalanx of the right small finger.  She was splinted.  She is here today for her 1st follow-up and has worn her splint until today. She complains of swelling stiffness and pain.      PAST MEDICAL HISTORY  Patient Active Problem List    Diagnosis Date Noted   . Finger dislocation, subsequent encounter 12/23/2015   . ADD (attention deficit disorder) 04/06/2013   . Birth control 04/06/2013       PAST SURGICAL HISTORY  Past Surgical History:   Procedure Laterality Date   . ------------OTHER-------------  35 yo    humerus ORIF        MEDICATIONS    Amphetamine-Dextroamphetamine (ADDERALL PO) as needed.   drospirenone-ethinyl estradiol (YAZ) 3-0.02 MG tablet Take 1 tablet by mouth daily.   Zolpidem Tartrate (AMBIEN PO) as needed.     ALLERGIES  No Known Allergies    SOCIAL HISTORY  The patient has no children.  She lives alone.  She exercises daily.  She is not on a special diet, does not do drugs and is a nonsmoker.  She drinks alcohol 1-2 times per week.      FAMILY HISTORY  Family History   Problem Relation Age of Onset   . Diabetes Paternal Uncle    . Breast Cancer Paternal Aunt    . Breast Cancer Mother 67   . Breast Cancer Paternal Grandmother    . Colon Cancer Neg Hx    . Hypertension Neg Hx    . Cholesterol/Lipid Disorder Neg Hx         REVIEW OF SYSTEMS  A comprehensive review of systems questionnaire completed by the patient is negative except for the history of present illness.    PHYSICAL EXAMINATION  VITALS: BP 118/76  Pulse 72  Temp 97.5 F (36.4 C)  LMP 12/14/2015 (Approximate), There is no height or  weight on file to calculate BMI., Data Unavailable/10  GENERAL: A+Ox3.  Well appearing and well groomed.   MENTAL STATUS: Pleasant and cooperative.  UPPER EXTREMITY:  The examination is focused on the right small finger.  The right small finger is stiff in extension. It is swollen.  The PIP joint is globally tender.  The PIP joint is stable.  The central slip and terminal tendon as well as FDP are all intact. Skin and neurovascular examinations are grossly intact.    IMAGING STUDIES  Radiographs of the right small finger demonstrate the PIP joint is reduced and that there is a small volar plate avulsion fracture not involving the joint.    IMPRESSION AND PLAN  Three weeks status post right small finger PIP joint dislocation associated with a volar plate avulsion fracture of the proximal base of the middle phalanx.  The bony injury is structurally trivial.  The patient is swollen and stiff.  She was educated with regard to home exercise program and precautions.  She was given a prescription for therapy.  She will continue working on range of motion. She will follow up with her primary care physician.  Unfortunately due to the soft  tissue injury that occurs with PIP joint dislocations, these injuries, though seemingly trivial, can frequently take as long as 6-8 months to be pain-free and fully functional.

## 2015-12-24 ENCOUNTER — Telehealth (INDEPENDENT_AMBULATORY_CARE_PROVIDER_SITE_OTHER): Payer: Self-pay

## 2015-12-24 LAB — CHLAMYDIA/GONORRHEA PCR, URINE
Chlamydia trachomatis PCR, Urine: NEGATIVE
Neisseria gonorrhoeae PCR, Urine: NEGATIVE

## 2015-12-24 NOTE — Telephone Encounter (Signed)
I have attempted to reach the patient and schedule a therapy appointment. I was able to leave a message and provide our main line [phone], so the patient may call us back to schedule at their earliest convenience.

## 2016-01-20 ENCOUNTER — Telehealth (HOSPITAL_BASED_OUTPATIENT_CLINIC_OR_DEPARTMENT_OTHER): Payer: Self-pay

## 2016-01-20 NOTE — Telephone Encounter (Signed)
Left vm for patient to conf appt for OT Therapy tomorrow Tue 01/21/16. Left a detailed msj with appt's date/time/location. If any questions, or need to cxl this apapt to please contact us at ph [phone].

## 2016-01-21 ENCOUNTER — Ambulatory Visit
Admission: RE | Admit: 2016-01-21 | Discharge: 2016-01-21 | Disposition: A | Discharge status: Home / Self Care | Payer: Medicaid Other | Attending: Orthopaedic Surgery | Admitting: Orthopaedic Surgery

## 2016-01-21 DIAGNOSIS — M25641 Stiffness of right hand, not elsewhere classified: Principal | ICD-10-CM | POA: Insufficient documentation

## 2016-01-21 DIAGNOSIS — S63259D Unspecified dislocation of unspecified finger, subsequent encounter: Secondary | ICD-10-CM | POA: Insufficient documentation

## 2016-01-21 NOTE — Interdisciplinary (Signed)
Occupational Therapy Hand Therapy Evaluation        Preferred Language:English    History  History  Start of Care: 01/21/16  Mechanism of Injury : Trauma  Reason for Referral: Evaluate and Treat  History of Current Condition: pt is a RHD 35 y.o. female who sustained a volar plate avulsion fracture R SF while playing volley ball in March 2017. Pt was initially splinted post injury. Pt now with swelling and stiffness R SF. Pt was referred to OT Hand Therapy for eval and ROM  Results of Diagnostic Tests: volar plate avulsion fx base of middle phalanx R SF  Social History: single  Prior Level of Function: Active    Objective  Pain Score (0-10): 3  Pain Location: R SF   Type of Eval: Evaluation- low complexity ([id number])  ADLs: Independent DIfficulty using her R hand to grip and twist objects. Pt also reports catching her R SF while reaching into pant pockets.    Sensation: light touch intact R digits    Edema  R SF PIP=5.5cm/ L SF PIP=5.0cm    AROM  R SF MP= 0-85 PIP= 15-45 DIP=0-25 DPC=4.5cm    Treatment Today   pt seen after eval for MHP; STM; P/AA/ARom ; IP blocking; tendon gliding and soft theraplast exercises. Pt was instructed on a HEP. R SF fitted with a digi sleeve at end of tx session.          Plan  The goals listed below are achievable and realistic within the designated time frame and the treatments listed below, and referred to in the treatment plan, are necessary to achieve these goals. The functional goals were created based on the patient's prior level of function.    Treatment Plan  Diagnosis: s/p PIP dislocation R SF  Assessment: pt with limited ROM R SF due to swelling; joint stiff and intrinsic tightness. Recommend OT Hand Therapy to maxmimize ROM; increase strength and promote full functional use of pt's R dominant hand  Problems: Pain;Decreased ROM;Decreased strength (edema R SF)  Patient Stated Goal: increase ROM and R hand strength  Long Term Goal: pt to report full fucntional use of her R dominant  hand  Projected Completion date: 8 visits  Short Term Goal #1: pt to be independent in a HEP  # Visits: 1-3  Short Term Goal #2: pt to increase active DPC R SF to 1.0cm   # Visits: 5-7  Interventions: HEP instruction;Therapeutic exercises;Functional activities  Duration of Treatment: 6-8 weeks  Patient/Family Teaching: Completed with patient/family/ caregiver  Response to Therapy : Good    Total Treatment Time (min): 45  Total Timed Treatment (min) : 45

## 2016-02-07 ENCOUNTER — Ambulatory Visit
Admission: RE | Admit: 2016-02-07 | Discharge: 2016-02-09 | Disposition: A | Discharge status: Home / Self Care | Payer: Medicaid Other | Attending: Orthopaedic Surgery | Admitting: Orthopaedic Surgery

## 2016-02-07 DIAGNOSIS — M25641 Stiffness of right hand, not elsewhere classified: Principal | ICD-10-CM | POA: Insufficient documentation

## 2016-02-09 NOTE — Interdisciplinary (Signed)
Occupational Therapy Hand Treatment Note        Preferred Language:English    Subjective  Status Since Last Visit: Pt reports she has resumed playing volleyball, is taping her SF to her RF to try to avoid re-injury. Pt also reports continued SF stiffness.  Pain Score (0-10): 3  Pain Location: R SF     Treatment Today     Medi-Cal  Medi-Cal Treatment [x3908]: Initial 30 minutes       Objective     Objective Findings: Paraffin plus MHP R hand. Tendon glides, instrinsic stretching, place and hold, lumbrical press. Joint blocking, A/AA/PROM to facilitate R SF PIP AROM. Post tx, active DPC 3 CM,.     Assessment  Assessment : Improved R SF AROM post treatment, however moderate SF stiffness remains.     Plan  Patient to continue therapy for     ICD-10-CM ICD-9-CM    1. Stiffness of finger joint, right M25.641 719.54      Focus for Next Treatment: Manual therapy;Therapeutic exercises;Skilled ROM;Modalities;HEP instruction  Frequency of Treatment: 1x wk    Total Treatment Time (min): 30  Total Timed Treatment (min) : 30

## 2016-02-14 ENCOUNTER — Telehealth (INDEPENDENT_AMBULATORY_CARE_PROVIDER_SITE_OTHER): Payer: Self-pay | Admitting: Internal Medicine

## 2016-02-14 ENCOUNTER — Ambulatory Visit (HOSPITAL_BASED_OUTPATIENT_CLINIC_OR_DEPARTMENT_OTHER)
Admit: 2016-02-14 | Discharge: 2016-02-15 | Payer: Medicaid Other | Admitting: Rehabilitative and Restorative Service Providers"

## 2016-02-14 DIAGNOSIS — M25641 Stiffness of right hand, not elsewhere classified: Principal | ICD-10-CM

## 2016-02-14 NOTE — Telephone Encounter (Signed)
[facility] Summer for [facility].

## 2016-02-14 NOTE — Telephone Encounter (Signed)
Symptom Triage        Name of PCP Provider: Leane PlattYang, Jenny Zhu   Insurance Coverage Verified: Active  Last office visit: 12/17/2015  Next office visit:  Visit date not found    Who is reporting the symptoms? Patient  What symptom is the patient experiencing? Back pain rates pain (sometimes 8-9/10), currently 3 or 4/10 at this time    Is this a new or ongoing symptom? new  Estimated time since experiencing symptom(s)? At lease 4-5 months    Best way to contact patient: [phone] home and [phone] (mobile)   Alternative communication method: [phone] (mobile)     Has been advised this message with symptoms will be transmitted to triage nurse. And to please let us know if symptoms worsen or change, or proceed to the nearest emergency department or call 911.

## 2016-02-15 NOTE — Interdisciplinary (Signed)
Occupational Therapy Hand Treatment Note        Preferred Language:English    Subjective  Status Since Last Visit: Pt reports pain in her R SF continues to  limit her ability to complete her ROM exercises.   Pain Score (0-10): 4  Pain Location: R SF     Treatment Today     Medi-Cal  Medi-Cal Treatment [x3908]: Initial 30 minutes       Objective     Objective Findings: Paraffin plus MHP R hand. Tendon glides, instrinsic stretching, place and hold, lumbrical press. Joint blocking, A/AA/PROM to facilitate R SF PIP AROM. Post tx AROM R SF MP 0-95*, PIP 5-*60*, DIP 0*-30*.  Post tx active DPC 3 cm, passive DPC 2 cm.    Assessment  Assessment : Moderate R SF stiffness and mild swelling remain, unable to progress PROM 2/2 pain.    Plan  Patient to continue therapy for     ICD-10-CM ICD-9-CM    1. Stiffness of finger joint, right M25.641 719.54      Focus for Next Treatment: Therapeutic exercises;Modalities;Skilled ROM  Frequency of Treatment: 1x wk    Total Treatment Time (min): 30  Total Timed Treatment (min) : 30

## 2016-02-17 NOTE — Telephone Encounter (Signed)
Returned call to pt. Left voice message for patient with a call back number for the clinic.

## 2016-02-18 ENCOUNTER — Ambulatory Visit (HOSPITAL_BASED_OUTPATIENT_CLINIC_OR_DEPARTMENT_OTHER)
Admit: 2016-02-18 | Discharge: 2016-02-18 | Payer: Medicaid Other | Admitting: Rehabilitative and Restorative Service Providers"

## 2016-02-18 DIAGNOSIS — M25641 Stiffness of right hand, not elsewhere classified: Principal | ICD-10-CM

## 2016-02-18 NOTE — Telephone Encounter (Signed)
Second attempt to contact pt. Left voice message for patient with a call back number for the clinic. Informed pt to call if patient would like to schedule an appointment or talk to the triage nurse.

## 2016-02-18 NOTE — Telephone Encounter (Signed)
Appointment noted. Routing to Dr. Joslyn HyPrakash for FYI.

## 2016-02-18 NOTE — Interdisciplinary (Signed)
Occupational Therapy Hand Treatment Note        Preferred Language:English    Subjective  Status Since Last Visit: Pt reports continued pain in her R SF which hinders her ability to complete P/AROM exercises.   Pain Score (0-10): 6  Pain Location: R SF with PIP ROM.    Treatment Today     Medi-Cal  Medi-Cal Treatment [x3908]: Initial 30 minutes       Objective     Objective Findings: Paraffin plus MHP R hand. Tendon glides, instrinsic stretching, place and hold, lumbrical press. Joint blocking, A/AA/PROM to facilitate R SF PIP AROM. Oval 8 size 7 issued for use HS. Post tx AROM R SF MP 0-95*, PIP 5-*65*, DIP 0*-35*.  Post tx active DPC 3 cm, passive DPC 2 cm.    Assessment  Assessment : Slight improvement with PIP/DIP AROM, active DPC remains unchanged. Pain continues to limit her ROM HEP.    Plan  Patient to continue therapy for     ICD-10-CM ICD-9-CM    1. Stiffness of finger joint, right M25.641 719.54      Focus for Next Treatment: Therapeutic exercises;Manual therapy;Skilled ROM;Modalities  Frequency of Treatment: 1x wk    Total Treatment Time (min): 30  Total Timed Treatment (min) : 30

## 2016-02-18 NOTE — Telephone Encounter (Signed)
Pt called in and made an apt with Dr Orvan FalconerPrakash Tues 06/20 at 330 pm

## 2016-02-25 ENCOUNTER — Ambulatory Visit (HOSPITAL_BASED_OUTPATIENT_CLINIC_OR_DEPARTMENT_OTHER)
Admit: 2016-02-25 | Discharge: 2016-02-26 | Payer: Medicaid Other | Admitting: Rehabilitative and Restorative Service Providers"

## 2016-02-25 ENCOUNTER — Ambulatory Visit (INDEPENDENT_AMBULATORY_CARE_PROVIDER_SITE_OTHER): Payer: Medicaid Other | Admitting: Infectious Disease

## 2016-02-25 VITALS — BP 110/64 | HR 58 | Temp 98.9°F | Resp 14 | Ht 70.0 in | Wt 166.0 lb

## 2016-02-25 DIAGNOSIS — M25641 Stiffness of right hand, not elsewhere classified: Principal | ICD-10-CM

## 2016-02-25 DIAGNOSIS — G8929 Other chronic pain: Secondary | ICD-10-CM

## 2016-02-25 DIAGNOSIS — S63259A Unspecified dislocation of unspecified finger, initial encounter: Secondary | ICD-10-CM

## 2016-02-25 DIAGNOSIS — M545 Low back pain, unspecified: Secondary | ICD-10-CM

## 2016-02-25 NOTE — Progress Notes (Signed)
ATTENDING NOTE:    Chief complaint: Back pain    SUBJECTIVE:  I reviewed the history and interviewed the patient.    History of present illness: Haley Barrett is a 35 year old woman with ADD who presents with back pain.    Endorses 6 months of intermittent lower back pain.  Denies any obvious inciting trauma.  Is normally quite active at baseline.  Pain seems to be most localized at SI joints.  Worse after running but sometimes better after activity.  Denies associated rashes, joint pain, morning stiffness, etc.  Has tried ibuprofen PRN and ice packs, which both help.    Review of Systems: As per resident's note.    Past Medical, Family, Social History: Reviewed and updated as per resident note.    OBJECTIVE: BP 110/64 (BP cuff size: Regular)  Pulse 58  Temp 98.9 F (37.2 C) (Oral)  Resp 14  Ht 5\' 10"  (1.778 m)  Wt 75.3 kg (166 lb)  SpO2 100%  BMI 23.82 kg/m2    Well-appearing, non-toxic, NAD  TTP lumbar paraspinal area  Negative bilateral straight leg raise    I have examined the patient and I concur with the resident's exam.    MEDICAL PLAN of CARE:  Assessment and plan reviewed with the resident physician.  I agree with the resident's plans as documented.    #Back pain: Seems most likely musculoskeletal in etiology.  Denies any inciting trauma or radiculopathy.      - X-ray lumbar spine   - PT referral   - Continue NSAIDs  - Alternating ice and heat    See the resident's note for further details.    Gertie BaronJeffrey Flara Storti, MD, MPH  Internal Medicine Attending

## 2016-02-25 NOTE — Progress Notes (Signed)
Chief Complaint   Patient presents with   . Back Pain     x 6 months increasing.         SUBJECTIVE::Haley Barrett is a 35 year old female who presents to the clinic today for ACUTE VISIT    1. Back Pain - ongoing for about 6 months .   Bilateral back pain, worse when standing or more active, laying flat on back makes pain worse  Improved when she lies on her side. Ice makes the pain a little better. Massages helps the same. Advil helps symptoms.   Play voleyball, riding works out and lifts weights once a week  No vision, no joint pains, no rashes, no diarrhea/constipation    Family History   Mother - OA 58(60s)  No autoimmune disorders in the family    ROS    Patient Active Problem List    Diagnosis Date Noted   . Finger dislocation, subsequent encounter 12/23/2015   . ADD (attention deficit disorder) 04/06/2013   . Birth control 04/06/2013       Medications:  Current Outpatient Prescriptions   Medication Sig   . Amphetamine-Dextroamphetamine (ADDERALL PO) as needed.   . drospirenone-ethinyl estradiol (YAZ) 3-0.02 MG tablet Take 1 tablet by mouth daily.   . Zolpidem Tartrate (AMBIEN PO) as needed.     No current facility-administered medications for this visit.        Allergies:  No Known Allergies    Physical Exam:   OBJECTIVE: BP 110/64 (BP cuff size: Regular)  Pulse 58  Temp 98.9 F (37.2 C) (Oral)  Resp 14  Ht 5\' 10"  (1.778 m)  Wt 75.3 kg (166 lb)  SpO2 100%  BMI 23.82 kg/m2     Physical Exam     Gen: well nourished, well groomed F in NAD, AAOX3  Back: pain on flexion midline L2-L4. Otherwise no pain on extension of side flexion. On palpation pain over L2-L4       Assessment/Plan:  Haley Barrett was seen today for back pain.    Diagnoses and all orders for this visit:    Finger dislocation, initial encounter  -     Physical Therapy - Internal ([place]); Future    Chronic midline low back pain without sciatica, some mild paraspinal tendreness bilaterally. Recommend consistent daily exercise, low impact. Ice/heat packs  and 1 week of scheduled antiinflammatories. Will also recommend physical therapy to assist with pain given ongoing for 6 months  -     X-Ray Lumbosacral Spine 2 Or 3 Views; Future  -     Physical Therapy - Internal ([place]); Future    Follow-up if pain does not improve    Pt was seen and discussed with Dr. Lauralyn PrimesJenks, who is in agreement with my plan    Hampton AbbotKatya Shayona Hibbitts M.D  Internal Medicine PGY3  [phone]

## 2016-02-25 NOTE — Patient Instructions (Signed)
1. Use ibuprofen 600mg  3-4 times daily or naproxen 500mg  twice daily for 1 week  2. Use ice packs when sore, heat packs every time and massages is possible  3. Try to keep active, lower impact spots  4. Get xrays of your lower back

## 2016-02-26 ENCOUNTER — Telehealth (INDEPENDENT_AMBULATORY_CARE_PROVIDER_SITE_OTHER): Payer: Self-pay

## 2016-02-26 ENCOUNTER — Telehealth (INDEPENDENT_AMBULATORY_CARE_PROVIDER_SITE_OTHER): Payer: Self-pay | Admitting: Infectious Disease

## 2016-02-26 DIAGNOSIS — S63259D Unspecified dislocation of unspecified finger, subsequent encounter: Secondary | ICD-10-CM

## 2016-02-26 NOTE — Interdisciplinary (Signed)
Occupational Therapy Hand Treatment Note        Preferred Language:English    Subjective  Status Since Last Visit: Pt reports improved ability to stretch her R SF.  Pain Score (0-10): 5  Pain Location: R SF with PIP ROM.    Treatment Today     Medi-Cal  Medi-Cal Treatment [x3908]: Initial 30 minutes       Objective     Objective Findings: MHP R hand. Gentle PROM to R SF PIP with hold, gentle composite SF flexion. Grade I-II PIP mobs. Place and hold, intrinsic stretching, A/AA/PROM to facilitate R SF PIP AROM. Post tx AROM R SF MP 0-95*, PIP 5*-75*, DIP 0*-35*.  Active DPC L SF 2.5 cm, passive DPC 2 cm.    Assessment  Assessment : Improved active and passive DPC of R SF, slight decrease in pain.     Plan  Patient to continue therapy for     ICD-10-CM ICD-9-CM    1. Stiffness of finger joint, right M25.641 719.54      Focus for Next Treatment: Therapeutic exercises;Modalities;Skilled ROM  Frequency of Treatment: 1x wk    Total Treatment Time (min): 30  Total Timed Treatment (min) : 30

## 2016-02-26 NOTE — Telephone Encounter (Signed)
I have attempted to reach the patient and schedule a therapy appointment. I was able to leave a message and provide our main line [phone], so the patient may call us back to schedule at their earliest convenience.

## 2016-02-27 ENCOUNTER — Ambulatory Visit
Admission: RE | Admit: 2016-02-27 | Discharge: 2016-02-27 | Disposition: A | Discharge status: Home / Self Care | Payer: Medicaid Other | Attending: Diagnostic Radiology | Admitting: Diagnostic Radiology

## 2016-02-27 DIAGNOSIS — M47897 Other spondylosis, lumbosacral region: Principal | ICD-10-CM | POA: Insufficient documentation

## 2016-03-03 ENCOUNTER — Ambulatory Visit (HOSPITAL_BASED_OUTPATIENT_CLINIC_OR_DEPARTMENT_OTHER)
Admit: 2016-03-03 | Discharge: 2016-03-04 | Payer: Medicaid Other | Admitting: Rehabilitative and Restorative Service Providers"

## 2016-03-03 DIAGNOSIS — M25641 Stiffness of right hand, not elsewhere classified: Principal | ICD-10-CM

## 2016-03-04 NOTE — Interdisciplinary (Signed)
Occupational Therapy Hand Treatment Note        Preferred Language:English    Subjective  Status Since Last Visit: Pt reports she was out of town this past weekend and has not has a lot of time to complete her HEP, also reports she played one game of volleyball.  Pain Score (0-10): 4  Pain Location: R SF with PIP ROM.    Treatment Today     Medi-Cal  Medi-Cal Treatment [x3908]: Initial 30 minutes  Medi-Cal treatment 15 min addtl [X3910]            : x1       Objective     Objective Findings: Pt fabricated R SF gutter splint to facilitate PIP extension. Pt will wear splint HS. Gentle R SF PIP mobs, PROM MP/IP flexion. Tendon glides, joint blocking, place and hold. Active DPC R SF 2.0cm.     Assessment  R SF active DPC improved by 0.5 cm since last visit, PIP joint remains tender and pt with a 20* PIP extensor lag. Overall functional use of her R hand is improving.     Plan  Patient to continue therapy for     ICD-10-CM ICD-9-CM    1. Stiffness of finger joint, right M25.641 719.54      Focus for Next Treatment: Skilled ROM;Modalities;Therapeutic exercises;Manual therapy  Frequency of Treatment: 1x wk    Total Treatment Time (min): 45  Total Timed Treatment (min) : 45

## 2016-03-12 ENCOUNTER — Ambulatory Visit (HOSPITAL_BASED_OUTPATIENT_CLINIC_OR_DEPARTMENT_OTHER): Payer: Medicaid Other | Admitting: Rehabilitative and Restorative Service Providers"

## 2016-03-12 ENCOUNTER — Ambulatory Visit
Admission: RE | Admit: 2016-03-12 | Discharge: 2016-03-12 | Disposition: A | Discharge status: Home / Self Care | Payer: Medicaid Other | Attending: Orthopaedic Surgery | Admitting: Orthopaedic Surgery

## 2016-03-12 DIAGNOSIS — M25641 Stiffness of right hand, not elsewhere classified: Principal | ICD-10-CM | POA: Insufficient documentation

## 2016-03-12 NOTE — Interdisciplinary (Signed)
Occupational Therapy Hand Treatment Note        Preferred Language:English    Subjective  Status Since Last Visit: Pt reports she has not had a lot of time to complete her exercises, wore her R SF gutter splint 2 nights in the past 10 days. Pt also reports decreased pain in her SF, but continued stiffness.  Pain Score (0-10): 2  Pain Location: R SF with PIP ROM.    Treatment Today     Medi-Cal  Medi-Cal Treatment [x3908]: Initial 30 minutes       Objective     Objective Findings: Paraffin plus MHP R SF. STM to R SF, gentle PIP mobs, tendon glides, hooking, place and hold, joint blocking. Pt issued light resistive foam sponge for light gripping and to assist with A/PROM of her SF. Active DPC R SF 1.75 cm, R SF PIP -20* from full extension.    Assessment  Assessment : Decreased pain and improved tolerance for PROM of her R SF. [facility].     Plan  Patient to continue therapy for     ICD-10-CM ICD-9-CM    1. Stiffness of finger joint, right M25.641 719.54      Focus for Next Treatment: Therapeutic exercises;Modalities;Skilled ROM  Frequency of Treatment: 1x wk    Total Treatment Time (min): 30  Total Timed Treatment (min) : 30

## 2016-03-19 ENCOUNTER — Ambulatory Visit (HOSPITAL_BASED_OUTPATIENT_CLINIC_OR_DEPARTMENT_OTHER): Payer: Medicaid Other | Admitting: Rehabilitative and Restorative Service Providers"

## 2016-03-30 ENCOUNTER — Ambulatory Visit
Admission: RE | Admit: 2016-03-30 | Discharge: 2016-04-01 | Disposition: A | Discharge status: Home / Self Care | Payer: Medicaid Other | Attending: Internal Medicine | Admitting: Internal Medicine

## 2016-03-30 ENCOUNTER — Ambulatory Visit (HOSPITAL_BASED_OUTPATIENT_CLINIC_OR_DEPARTMENT_OTHER)
Admit: 2016-03-30 | Discharge: 2016-03-30 | Payer: Medicaid Other | Admitting: Rehabilitative and Restorative Service Providers"

## 2016-03-30 DIAGNOSIS — M25641 Stiffness of right hand, not elsewhere classified: Principal | ICD-10-CM

## 2016-03-30 DIAGNOSIS — G8929 Other chronic pain: Secondary | ICD-10-CM | POA: Insufficient documentation

## 2016-03-30 DIAGNOSIS — M545 Low back pain, unspecified: Secondary | ICD-10-CM

## 2016-03-30 NOTE — Interdisciplinary (Signed)
Occupational Therapy Hand Treatment Note        Preferred Language:English    Subjective  Status Since Last Visit: Pt reports she continues to have difficulty opening tight jars but feels like her R hand is "almost normal".  Pain Score (0-10): 2  Pain Location: R SF with end range PIP ROM.    Treatment Today     Medi-Cal  Medi-Cal Treatment [x3908]: Initial 30 minutes       Objective     Objective Findings: MHP R hand. PIP mobs R SF to facilitate finger ROM. Intrinsic stretching, place and hold into composite fist, passive R SF into composite flexion.Dynamic R SF PIP extension splint (short small) issued for patient to wear 4x per day, 15-20 minutes per rep. [place] provided to patient to assist with intrinsic stretchting of R SF. R grip strength 55#, L grip strength 70#. Post tx: active DPC 1.5 cm R SF,  PIP AROM 10*-80*, PIP 0-*55*.     Assessment  Assessment : Improved active and passive motion of her R SF, continued mild intrinsic tightness.     Plan  Patient to continue therapy for     ICD-10-CM ICD-9-CM    1. Stiffness of finger joint, right M25.641 719.54      Focus for Next Treatment: Therapeutic exercises  Frequency of Treatment: 1x wk    Total Treatment Time (min): 30  Total Timed Treatment (min) : 30

## 2016-04-01 NOTE — Interdisciplinary (Signed)
Physical Therapy Evaluation      Referring Physician: Fulton Mole           Outpatient      ICD-10-CM ICD-9-CM    1. Chronic midline low back pain without sciatica M54.5 724.2 CONSULT/REFERRAL TO PHYSICAL THERAPY-INTERNAL ([place])    G89.29 338.29 CONSULT/REFERRAL TO PHYSICAL THERAPY-INTERNAL ([place])       Start of Care: 03/30/16  Onset date : > 6 months  Reason for referral: Pain     Preferred Language:English        Assessment   Assessment : Pt is a 35 y/o F diagnosed with lower back pain for 6 months. Pt with x-rays done, see report for details. Pt with decrease ROM in extension. Pt with TTP to B lumbar region. Pt with some tightness to L psoas muscle. Pt with decrease symptoms with lumbar flexion exercises and sacral extension. Pt with negative B SLR and SI special test.  Pt was instructed on a HEP. Pt would benefit from PT to help increase her strength to her core, pelvis and lumbar region to help decrease her symptoms.     Plan  The plan of care was developed in conjunction with the patient's goals. It was reviewed with the patient, including a review of the physical findings, proposed treatment, frequency and duration of treatment sessions, precautions, limitations and expected outcomes. The patient acknowledged understanding of all of the above and agreed to the treatment plan as stated.    Therapy Goals                                                                                        Current Level Goals for Episode of Care    Functional Deficit     Long term Functional Goal  Goal #3 (Long Term) : Pt will be able to stand for > 45 minutes without pain to be able to cook and wash dishes.   No. visits: 10-14  Goal status: New   Impairment #1   Short Term Impairment Goal #1 Custom goal: Pt will be Ind in HEP.   No. visits: 1-3  Goal status: New   Impairment #2   Short Term Impairment Goal #2 Custom goal: Pt will report a decrease in her symptoms by 15%.   No. visits: 3-5  Goal status: New   Functional  Limitation Reporting             Treatment Plan and Rationale  Manual Therapy: to improve joint and soft tissue alignment;to improve joint and soft tissue integrety;to improve joint and soft tissue mobility;to decrease tissue tension and restriction;to decrease pain;to improve segmental joint mobility  Patient Education: to improve compliance with independent home exercise program;to increase knowledge of purpose of home exercise program  Theraputic Exercise: to improve activity tolerance;to increase flexibility;to increase range of motion;to increase strength  Heat/Cold : to relax soft tissue post treatment;to prepare soft tissue for therapeutic intervention;to decrease pain;to decrease spasticity and normalize tone  Treatment Plan  Include in My Healthcare: Myself  Treatment Plan Discussion & Agreement: Patient  Patient/Family Questions: Yes - All questions asked & answered  Patient/Family Teaching: Initiated  Preferred Learning Method: Printed material;Verbal instructions;Demonstration  Focus for Next Treatment: Manual therapy;Patient exercise program instruction;Skilled techniques to improve muscle and tissue flexibility;Skilled techniques to improve range of motion;Strengthening;Therapeutic exercise  OP Treatment Frequency: 1 time per week  OP Treatment Duration: 8 to 12 weeks       Patient History   Medical History  History of presenting condition:   Pt reports she fels it all the time. She says she will sit on ground and will feel a pop in her back. She says standing is the worse. She says she has had low back pain as a teenager. She had some radiating  shooting pain to her L hip region. She denies any numbness, decrease strength or bowel or bladder. She  does not wake up due to pain. She denies having any kids. She had been seen from chiropractor. She does sit ups and plays volleyball for exercises.   Previous treatment for condition: None  Mechanism of Injury: Insidious onset  Diagnostic Tests:  X-Ray  Comments : mild osteoarthropathy L4-S1, minimal degnerative SI  Outpatient Precautions / Contraindications: None  Past Medical History:   Diagnosis Date   . ADD (attention deficit disorder)    . Clavicular fracture      Current Outpatient Prescriptions   Medication Sig   . Amphetamine-Dextroamphetamine (ADDERALL PO) as needed.   . drospirenone-ethinyl estradiol (YAZ) 3-0.02 MG tablet Take 1 tablet by mouth daily.   . Zolpidem Tartrate (AMBIEN PO) as needed.     No current facility-administered medications for this encounter.             Functional History  Prior Level of Function: No deficits         Social History  Living Situation: Lives alone  Occupation: Other (Comment) (pyscotherapist)     Subjective    OUTPATIENT PAIN SCALES    Numeric Pain Rating Scale (must address all rows)  Pain Intensity - rating at present: 1  Pain Intensity - rating at worst : 9  Frequency : Intermittent  Description : Aching;Throbbing                Objective    Lumbar Spine Exam    Lumbar Spine       Most Recent Value    Lumbar Spine Observation    Tenderness  Lumbar spine paraspinals bilaterally, Posterior superior iliac spine bilaterally    Lumbar Spine Flexion    AROM deficit None    Pain Painful at end of range    Lumbar Spine Extension    AROM deficit 25%    Pain  Painful throughout range    Lumbar Spine Bending Right    PROM deficit None    Pain  Pain free motion    Lumbar Spine Bending Left    AROM deficit None    Pain  Painful at end of range [feels it on the L sidce. ]    Lumbar Spine Rotation Right    AROM deficit None    Pain  Painful throughout range, Painful at end of range    Lumbar Spine Rotation Left    AROM deficit None    Pain  Painful at end of range, Painful throughout range    Lumbar Spine Joint Play    Lumbar Spine Repetitive Movements    Lumbar Spine Special Tests Right    Sacroiliac compression - Right Negative    Sacroiliacdistraction - Right Negative    Straight leg raise - Right Negative  Lumbar  Spine Special Tests Left    Sacroiliac compression - Left Negative    Sacroiliacdistraction - Left Negative    Straight leg raise - Left Negative    Straight leg raise - Right Negative                                    Treatment Today          MediCal Evaluation  Medi-Cal Eval Initial 30 minutes (3920): Completed  Medi-Cal eval 15 min addtl (M5784)          : 1          Treatment Time   Total TIMED Treatment  (min): 45  Total Treatment Time (min): 45

## 2016-04-10 ENCOUNTER — Ambulatory Visit
Admission: RE | Admit: 2016-04-10 | Discharge: 2016-04-10 | Disposition: A | Discharge status: Home / Self Care | Payer: Medicaid Other | Attending: Orthopaedic Surgery | Admitting: Orthopaedic Surgery

## 2016-04-10 ENCOUNTER — Ambulatory Visit
Admission: RE | Admit: 2016-04-10 | Discharge: 2016-04-10 | Disposition: A | Discharge status: Home / Self Care | Payer: Medicaid Other | Attending: Internal Medicine | Admitting: Internal Medicine

## 2016-04-10 ENCOUNTER — Ambulatory Visit (HOSPITAL_BASED_OUTPATIENT_CLINIC_OR_DEPARTMENT_OTHER): Payer: Medicaid Other | Admitting: Rehabilitative and Restorative Service Providers"

## 2016-04-10 DIAGNOSIS — G8929 Other chronic pain: Secondary | ICD-10-CM | POA: Insufficient documentation

## 2016-04-10 DIAGNOSIS — M545 Low back pain, unspecified: Secondary | ICD-10-CM

## 2016-04-10 DIAGNOSIS — M25641 Stiffness of right hand, not elsewhere classified: Principal | ICD-10-CM | POA: Insufficient documentation

## 2016-04-10 NOTE — Interdisciplinary (Signed)
Physical Therapy Treatment Note        Referring Physician: Fulton Mole            Interpreter Services - Rehab: In person  Preferred Language:English    Assessment              Assessment : After manual therapy, patient able to gain mobility in lower back when doing quadraped lateral trunk rotations.     Plan  Patient to continue therapy for     ICD-10-CM ICD-9-CM    1. Chronic midline low back pain without sciatica M54.5 724.2     G89.29 338.29      Treatment Plan  Include in My Healthcare: Myself  Treatment Plan Discussion & Agreement: Patient  Patient/Family Questions: Yes - All questions asked & answered  Patient/Family Teaching: Initiated  Preferred Learning Method: Printed material;Verbal instructions;Demonstration  Focus for Next Treatment: Manual therapy;Patient exercise program instruction;Skilled techniques to improve muscle and tissue flexibility;Skilled techniques to improve range of motion;Strengthening;Therapeutic exercise     Outpatient Treatment Plan  OP Treatment Frequency: 1 time per week  OP Treatment Duration: 8 to 12 weeks    Subjective   Subjective  Status since last treatment : Patient stated that she worked out and played volleyball this weekend and that increased her lower back pain.        Pain Assessment  OUTPATIENT PAIN SCALES    Numeric Pain Rating Scale (must address all rows)  Pain Intensity - rating at present: 8  Pain Intensity - rating at worst : 9  Pain Intensity- rating after treatment: 6  Frequency : Intermittent  Description : Aching;Throbbing              Precautions  Precautions/Contraindications  Outpatient Precautions / Contraindications: None             MediCal Treatment completed today  MediCal Treatment initial 30 minutes (Z0017): Completed  MediCAL Treatment today  Manual therapy : Joint mobilization;Soft tissue mobilization    Manual therapy : L SI joint mobe PA grade 4, MFR L erector spinae, L illiosoas release, T9 -L1 PA grade 3  Therapeutic  exercise  : Strengthening exercises;UBE    Therapeutic exercise  : supine H/s stretch, piriformis stretch, opposite rotation arms and legs in prone, quadraped lateral trunk rotations, childs pose stretch,     Treatment Time   Total TIMED Treatment  (min): 30  Total Treatment Time (min): 30

## 2016-04-10 NOTE — Interdisciplinary (Signed)
Occupational Therapy Hand Treatment Note        Preferred Language:English    Subjective  Status Since Last Visit: Pt reports decreased pain in he R RF and is able to sleep without dificulty.  Pain Score (0-10): 2  Pain Location: R SF with end range PIP ROM.    Treatment Today     Medi-Cal  Medi-Cal Treatment [x3908]: Initial 30 minutes       Objective     Objective Findings: Paraffin plus MHP R hand. Grade IV PIP joint mobs, intrinsic stretching, place and hold into composite fist. Yellow power web for light hooking with SF and strengthening of FDS and FDP.  Isometric wrist strengthening with yellow power web. R SF active DPC 1.25 cm, MP 0*-85*, PIP 10*-95*, DIP 0*-55*.    Assessment  Assessment : ImproveD composite flexion of her R SF, mild intrinsic tightnes remains. Anticipate 2 additional visits and then d/c.    Plan  Patient to continue therapy for     ICD-10-CM ICD-9-CM    1. Stiffness of finger joint, right M25.641 719.54      Focus for Next Treatment: Therapeutic exercises;Skilled ROM;Manual therapy  Frequency of Treatment: 1x wk    Total Treatment Time (min): 35  Total Timed Treatment (min) : 35

## 2016-04-17 ENCOUNTER — Ambulatory Visit (HOSPITAL_BASED_OUTPATIENT_CLINIC_OR_DEPARTMENT_OTHER)
Admit: 2016-04-17 | Discharge: 2016-04-17 | Payer: Medicaid Other | Admitting: Rehabilitative and Restorative Service Providers"

## 2016-04-17 DIAGNOSIS — M25641 Stiffness of right hand, not elsewhere classified: Principal | ICD-10-CM

## 2016-04-17 NOTE — Interdisciplinary (Signed)
Occupational Therapy Hand Treatment Note        Preferred Language:English    Subjective  Status Since Last Visit: Pt reports continued overall improvement with her R SF ROM and overall strength.   Pain Score (0-10): 1  Pain Location: R SF with end range PIP ROM.    Treatment Today     Medi-Cal  Medi-Cal Treatment [x3908]: Initial 30 minutes       Objective     Objective Findings: Paraffin plus MHP R hand. Grade IV PIP joint mobs, place and hold, joint blocking, instrinsic stretching. R grip strengthening with deluxe gripper and dowel pick-up. Yellow power web for light strengthening of FDS and FDP. Pt issued medium resistive theraputty for light grip and pinch strengthening. Active DPC R SF 1 cm, AROM R SF MP 0-90*, PIP 10*-95*, DIP 0-60*.     Assessment  Assessment : Pt with excellent improvement with her R SF AROM and functional use. Anticipate one additional visit and then d/c.     Plan  Patient to continue therapy for     ICD-10-CM ICD-9-CM    1. Stiffness of finger joint, right M25.641 719.54      Focus for Next Treatment: Therapeutic exercises;Manual therapy  Frequency of Treatment: 1x wk    Total Treatment Time (min): 30  Total Timed Treatment (min) : 30

## 2016-04-21 ENCOUNTER — Ambulatory Visit (HOSPITAL_BASED_OUTPATIENT_CLINIC_OR_DEPARTMENT_OTHER)
Admit: 2016-04-21 | Discharge: 2016-04-21 | Payer: Medicaid Other | Admitting: Rehabilitative and Restorative Service Providers"

## 2016-04-21 DIAGNOSIS — G8929 Other chronic pain: Principal | ICD-10-CM

## 2016-04-21 DIAGNOSIS — M25641 Stiffness of right hand, not elsewhere classified: Principal | ICD-10-CM

## 2016-04-21 DIAGNOSIS — M545 Low back pain, unspecified: Secondary | ICD-10-CM

## 2016-04-21 NOTE — Interdisciplinary (Signed)
Physical Therapy Treatment Note        Referring Physician: Fulton MoleHill, Deanna L            Interpreter Services - Rehab: In person  Preferred Language:English    Assessment              Assessment : Iproved SI mobility after Rx session and decrease stiffness Pt making good progress with back rehab  Rehab Potential: Good    Plan  Patient to continue therapy for     ICD-10-CM ICD-9-CM    1. Chronic midline low back pain without sciatica M54.5 724.2     G89.29 338.29      Treatment Plan  Include in My Healthcare: Myself  Treatment Plan Discussion & Agreement: Patient  Patient/Family Questions: Yes - All questions asked & answered  Patient/Family Teaching: Initiated  Preferred Learning Method: Printed material;Verbal instructions;Demonstration  Focus for Next Treatment: Manual therapy;Patient exercise program instruction;Skilled techniques to improve muscle and tissue flexibility;Skilled techniques to improve range of motion;Strengthening;Therapeutic exercise     Outpatient Treatment Plan  OP Treatment Frequency: 1 time per week  OP Treatment Duration: 8 to 12 weeks    Subjective   Subjective  Status since last treatment : Pt states playing a lot of volleyball and feeling ok this morning ,just sore       Pain Assessment  OUTPATIENT PAIN SCALES    Numeric Pain Rating Scale (must address all rows)  Pain Intensity - rating at present: 4  Pain Intensity - rating at worst : 9  Pain Intensity- rating after treatment: 2  Frequency : Intermittent  Description : Aching;Throbbing              Precautions  Precautions/Contraindications  Outpatient Precautions / Contraindications: None             MediCal Treatment completed today  MediCal Treatment initial 30 minutes (Z6109(x3908): Completed  MediCAL Treatment today  Manual therapy : Joint mobilization;Soft tissue mobilization    Manual therapy : L SI joint mobe PA grade 4, MFR L erector spinae, L illiosoas release, T9 -L1 PA grade 3  Therapeutic exercise  : Strengthening  exercises;UBE    Therapeutic exercise  : swiss ball with med ball,trunk rotation,bridge,kicks,all x 20 HS str,QL str ITB str knee press ups  with med ball  x 25,heat x 10 min    Treatment Time   Total TIMED Treatment  (min): 30  Total Treatment Time (min): 30

## 2016-04-21 NOTE — Interdisciplinary (Signed)
Occupational Therapy Discharge Summary    Treatment Start Date: 01/21/16  Treatment End Date: 04/21/16  Total Visits: 11       Preferred [place] Today     Medi-Cal  Medi-Cal Treatment [W1027]: Initial 30 minutes       Subjective  Pain Score (0-10): 1  Pain Location: R SF with end range PIP ROM.  Subjective: Pt reports excellent functional use of her R hand and only intermittenet pain.    Objective  ADLs: Independent  Functional Mobility: Independent  Interventions Today : Manual therapy;Modalities;Therapeutic exercises;Skilled ROM;HEP instruction  Paraffin plus MHP R hand. Grade IV PIP joint mobs, tendon glides, place and hold, joint blocking. A/AA/PROM R SF into composite flexion. Review of HEP. R SF AROM MP  0-90*, PIP 5*-95*, DIP 0-75*. R grip strength 55#. Active DPC 1 cm.      Response of Treatment: Excellent    Discharge Summary  Goals Met: All goals met. Pt with excellent improvement with her R SF AROM and R grip strength, also with decreased pain. No further skilled OT needs at this time.   Disposition: Remain in current living situation  Follow-up Care: None recommended  HEP Summary: Independent    Total Treatment Time (min): 30  Total Timed Treatment (min) : 30

## 2016-04-30 ENCOUNTER — Ambulatory Visit (HOSPITAL_BASED_OUTPATIENT_CLINIC_OR_DEPARTMENT_OTHER)
Admit: 2016-04-30 | Discharge: 2016-05-01 | Payer: Medicaid Other | Admitting: Rehabilitative and Restorative Service Providers"

## 2016-04-30 DIAGNOSIS — G8929 Other chronic pain: Principal | ICD-10-CM

## 2016-04-30 DIAGNOSIS — M545 Low back pain, unspecified: Secondary | ICD-10-CM

## 2016-05-01 NOTE — Interdisciplinary (Signed)
Physical Therapy Treatment Note        Referring Physician: Fulton MoleHill, Deanna L               Preferred Language:English    Assessment              Assessment : Pt returns reporting a decrease in symptoms. Pt with equal leg length with minimal TTP to L psoas and L lumbar region. Pt with no pain during treatment. Pt needs to continue builiding strength to core, lumbar and SI region to help decrease her pain. Pt improving with treatment.   Rehab Potential: Good    Plan  Patient to continue therapy for     ICD-10-CM ICD-9-CM    1. Chronic midline low back pain without sciatica M54.5 724.2     G89.29 338.29      Treatment Plan  Include in My Healthcare: Myself  Treatment Plan Discussion & Agreement: Patient  Patient/Family Questions: Yes - All questions asked & answered  Patient/Family Teaching: Ongoing  Preferred Learning Method: Printed material;Verbal instructions;Demonstration  Focus for Next Treatment: Manual therapy;Patient exercise program instruction;Skilled techniques to improve muscle and tissue flexibility;Skilled techniques to improve range of motion;Strengthening;Therapeutic exercise     Outpatient Treatment Plan  OP Treatment Frequency: 1 time per week  OP Treatment Duration: 8 to 12 weeks    Subjective   Subjective  Status since last treatment : Pt reports that overall better, stiff sometime. She played volleyball this weekend , was pretty good afterwards.        Pain Assessment  OUTPATIENT PAIN SCALES    Numeric Pain Rating Scale (must address all rows)  Pain Intensity - rating at present: 0              Precautions       Objective           MediCal Treatment completed today  MediCal Treatment initial 30 minutes (Z6109(x3908): Completed  MediCAL Treatment today  Therapeutic exercise  : Strengthening exercises;UBE    Therapeutic exercise  : L psoas release, L hip flexor stretch, single leg bridge 3x10, hip ER isometrics x 30, supine hip extension with swiss ball w/ SLR x 20, prone hip extension x  20.,     Treatment Time   Total TIMED Treatment  (min): 30  Total Treatment Time (min): 30

## 2016-05-04 ENCOUNTER — Ambulatory Visit (HOSPITAL_BASED_OUTPATIENT_CLINIC_OR_DEPARTMENT_OTHER)
Admit: 2016-05-04 | Discharge: 2016-05-04 | Payer: Medicaid Other | Admitting: Rehabilitative and Restorative Service Providers"

## 2016-05-04 ENCOUNTER — Other Ambulatory Visit (INDEPENDENT_AMBULATORY_CARE_PROVIDER_SITE_OTHER): Payer: Self-pay | Admitting: Internal Medicine

## 2016-05-04 DIAGNOSIS — M545 Low back pain, unspecified: Secondary | ICD-10-CM

## 2016-05-04 DIAGNOSIS — Z309 Encounter for contraceptive management, unspecified: Principal | ICD-10-CM

## 2016-05-04 DIAGNOSIS — G8929 Other chronic pain: Principal | ICD-10-CM

## 2016-05-04 NOTE — Interdisciplinary (Signed)
Physical Therapy Treatment Note        Referring Physician: Fulton MoleHill, Deanna L               Preferred Language:English    Assessment              Assessment : Pt working out at gym with no increased back pain only soreness. Pt progressing well with back rehab, and good understanding of HEP and how to manage back pain flare ups.   Rehab Potential: Good    Plan  Patient to continue therapy for     ICD-10-CM ICD-9-CM    1. Chronic midline low back pain without sciatica M54.5 724.2     G89.29 338.29      Treatment Plan  Include in My Healthcare: Myself  Treatment Plan Discussion & Agreement: Patient  Patient/Family Questions: Yes - All questions asked & answered  Patient/Family Teaching: Ongoing  Preferred Learning Method: Printed material;Verbal instructions;Demonstration  Focus for Next Treatment: Manual therapy;Patient exercise program instruction;Skilled techniques to improve muscle and tissue flexibility;Skilled techniques to improve range of motion;Strengthening;Therapeutic exercise     Outpatient Treatment Plan  OP Treatment Frequency: 1 time per week  OP Treatment Duration: 8 to 12 weeks    Subjective   Subjective  Status since last treatment : Pt reports feeling better, does not hurt when she walks her dog and is able to play voleyball. Does not feel pain, just soreness.        Pain Assessment  OUTPATIENT PAIN SCALES    Numeric Pain Rating Scale (must address all rows)  Pain Intensity - rating at worst : 9  Pain Intensity- rating after treatment: 2  Frequency : Intermittent  Description : Aching;Throbbing                   MediCal Treatment completed today  MediCal Treatment initial 30 minutes (Z6109(x3908): Completed  MediCAL Treatment today  Manual therapy : Joint mobilization;Soft tissue mobilization    Manual therapy : L SI joint mobe PA grade 4, MFR L erector spinae, L illiosoas release, T9 -L1 PA grade 3  Therapeutic exercise  : Strengthening exercises;UBE    Therapeutic exercise  :single leg  bridge 3x10, hip ER isometrics x 30, supine hip extension with swiss ball w/ SLR x 20, prone hip extension x 20., HS stretch, piriformis stretch, quad bird dog, side/prone planks X 30 sec, childs pose,     Treatment Time   Total TIMED Treatment  (min): 30  Total Treatment Time (min): 30

## 2016-05-04 NOTE — Telephone Encounter (Signed)
Prescription Refill Request     Incoming call from patient requesting refill   Name of PCP Provider: Leane PlattYang, Jenny Zhu   Last office visit: 12/17/2015  Next office visit: Visit date not found    Has encounter regarding same issue ?   No    (If yes, please document in previous encounter and close this encounter).      Requested Medications: (remove non-applicable meds)    Current Outpatient Prescriptions   Medication Sig Dispense Refill    drospirenone-ethinyl estradiol (YAZ) 3-0.02 MG tablet Take 1 tablet by mouth daily. 84 tablet 4       Send to:    [id number]- [place], [place] - [address]  [address]  [place] [id number]  Phone: [phone] Fax: [phone]      Has been advised this message will be transmitted to office and if any issues can expect a response within the next 24-72 hours, otherwise pharmacy should be contacting patient when refills are ready.

## 2016-05-05 MED ORDER — DROSPIRENONE-ETHINYL ESTRADIOL 3-0.02 MG OR TABS
1.0000 | ORAL_TABLET | Freq: Every day | ORAL | 4 refills | Status: DC
Start: 2016-05-05 — End: 2017-02-16

## 2016-05-05 NOTE — Telephone Encounter (Signed)
refilled 

## 2016-05-05 NOTE — Telephone Encounter (Signed)
Patient calling to check status of message. Please advise

## 2016-05-05 NOTE — Telephone Encounter (Signed)
Routing to Dr. Threasa BeardsYang and cross cover Dr. Waylan Bogahun for review.   Lab Results   Component Value Date    NA 139 12/17/2015    K 3.9 12/17/2015    CL 99 12/17/2015    BICARB 21 12/17/2015    BUN 13 12/17/2015    CREAT 0.83 12/17/2015    GLU 109 12/17/2015    Aten 9.4 12/17/2015

## 2016-05-06 NOTE — Telephone Encounter (Signed)
Pt. Advised via MyChart.

## 2016-05-12 ENCOUNTER — Ambulatory Visit
Admission: RE | Admit: 2016-05-12 | Discharge: 2016-05-12 | Disposition: A | Discharge status: Home / Self Care | Payer: Medicaid Other | Attending: Internal Medicine | Admitting: Internal Medicine

## 2016-05-12 DIAGNOSIS — M545 Low back pain: Principal | ICD-10-CM | POA: Insufficient documentation

## 2016-05-12 DIAGNOSIS — G8929 Other chronic pain: Secondary | ICD-10-CM | POA: Insufficient documentation

## 2016-05-12 NOTE — Interdisciplinary (Signed)
Physical Therapy Treatment Note        Referring Physician: Fulton MoleHill, Deanna L            Interpreter Services - Rehab: In person  Preferred Language:English    Assessment              Assessment : Pt states back feeling a little stiff 2/2 camping and not stretching as much  Rehab Potential: Good    Plan  Patient to continue therapy for     ICD-10-CM ICD-9-CM    1. Chronic midline low back pain without sciatica M54.5 724.2     G89.29 338.29      Treatment Plan  Include in My Healthcare: Myself  Treatment Plan Discussion & Agreement: Patient  Patient/Family Questions: Yes - All questions asked & answered  Patient/Family Teaching: Ongoing  Preferred Learning Method: Printed material;Verbal instructions;Demonstration  Focus for Next Treatment: Manual therapy;Patient exercise program instruction;Skilled techniques to improve muscle and tissue flexibility;Skilled techniques to improve range of motion;Strengthening;Therapeutic exercise     Outpatient Treatment Plan  OP Treatment Frequency: 1 time per week  OP Treatment Duration: 8 to 12 weeks    Subjective   Subjective  Status since last treatment : Pt states minimal pain only stiffness       Pain Assessment  OUTPATIENT PAIN SCALES    Numeric Pain Rating Scale (must address all rows)  Pain Intensity - rating at present: 3  Pain Intensity - rating at worst : 9  Pain Intensity- rating after treatment: 1  Frequency : Intermittent  Description : Aching;Throbbing              Precautions  Precautions/Contraindications  Outpatient Precautions / Contraindications: None             MediCal Treatment completed today  MediCal Treatment initial 30 minutes (W1191(x3908): Completed  MediCal Treatment Add 15 min minutes (Y7829(x3910): 1  MediCAL Treatment today  Manual therapy : Joint mobilization;Soft tissue mobilization    Manual therapy : L SI joint mobe PA grade 4, MFR L erector spinae, L illiosoas release, T9 -L1 PA grade 3  Therapeutic exercise  : Strengthening exercises;UBE   Therapeutic exercise  : L psoas release, L hip flexor stretch, single leg bridge 3x10, hip ER isometrics x 30, supine hip extension with swiss ball w/ SLR x 20, prone hip extension x 20.,     Treatment Time   Total TIMED Treatment  (min): 45  Total Treatment Time (min): 45

## 2016-05-22 ENCOUNTER — Ambulatory Visit (HOSPITAL_BASED_OUTPATIENT_CLINIC_OR_DEPARTMENT_OTHER)
Admit: 2016-05-22 | Discharge: 2016-05-25 | Payer: Medicaid Other | Admitting: Rehabilitative and Restorative Service Providers"

## 2016-05-22 DIAGNOSIS — M545 Low back pain, unspecified: Secondary | ICD-10-CM

## 2016-05-22 DIAGNOSIS — G8929 Other chronic pain: Principal | ICD-10-CM

## 2016-05-25 NOTE — Interdisciplinary (Signed)
Physical Therapy Treatment Note        Referring Physician: Fulton MoleHill, Deanna L               Preferred Language:English    Assessment              Assessment : Pt reporting that overall is she better with less debiliating pain. Pt with equal length leg, no pelvic rotation. Pt is still experiencing some pain/discomfort. Pt able to perform exercises without an increase in pain with treatment. Pt needs to continue building strength and stability to core, lumbar and pelvis region.   Rehab Potential: Good    Plan  Patient to continue therapy for     ICD-10-CM ICD-9-CM    1. Chronic midline low back pain without sciatica M54.5 724.2     G89.29 338.29      Treatment Plan  Include in My Healthcare: Myself  Treatment Plan Discussion & Agreement: Patient  Patient/Family Questions: Yes - All questions asked & answered  Patient/Family Teaching: Ongoing  Preferred Learning Method: Printed material;Verbal instructions;Demonstration  Focus for Next Treatment: Manual therapy;Patient exercise program instruction;Skilled techniques to improve muscle and tissue flexibility;Skilled techniques to improve range of motion;Strengthening;Therapeutic exercise     Outpatient Treatment Plan  OP Treatment Frequency: 1 time per week  OP Treatment Duration: 8 to 12 weeks    Subjective   Subjective  Status since last treatment : Pt reports that she still feels it but less than it was. She says at least 50% improvement. She says her pain comes and goes. She says she is not in so much pain. She can walk her dog with less pain.        Pain Assessment  OUTPATIENT PAIN SCALES    Numeric Pain Rating Scale (must address all rows)  Pain Intensity - rating at present: 3              Precautions       Objective           MediCal Treatment completed today  MediCal Treatment initial 30 minutes (N8295(x3908): Completed  MediCAL Treatment today  Therapeutic exercise  : Lumbar stabilization exercises;Strengthening exercises    Therapeutic exercise  : hip  ER/IR isometrics, bridges 4x10, prone hip extension x 20 each leg, prone hip abduction with hip extension x30, supine B hip extension with swiss ball w/ SLR x 20, Qped bird dog hip extension and shoulder flexion x 20    Treatment Time   Total TIMED Treatment  (min): 30  Total Treatment Time (min): 30

## 2016-06-18 ENCOUNTER — Telehealth (INDEPENDENT_AMBULATORY_CARE_PROVIDER_SITE_OTHER): Payer: Self-pay | Admitting: Internal Medicine

## 2016-06-18 DIAGNOSIS — M545 Low back pain, unspecified: Secondary | ICD-10-CM

## 2016-06-18 DIAGNOSIS — G8929 Other chronic pain: Principal | ICD-10-CM

## 2016-06-18 NOTE — Telephone Encounter (Signed)
Returned call to pt. Pt states she started birthcontrol while on antibiotics.   States has been taking birthcontrol since 35 yrs old.   Started taking abx for skin issue about 6 weeks ago.   States rash looks like little pimples.  States has been taking benadryl at night, antihistamine in the morning. States does seem to help.   Pt scheduled with Dr. Raoul PitchSierra on 10/19    rash on chest    Protocol Used: Rash or Redness - Widespread    Positive Triage Questions:  * Severe itching  * Mild widespread rash    Care Advice Discussed:  * Reassurance      - There are many causes of widespread rashes and most of the time they are not serious. Common causes include viral illness (e.g., cold viruses) and allergic reactions (to a food, medicine, or environmental exposure).      - Here is some care advice that should help.  * For Itchy Rashes      - Wash the skin once with gentle non-perfumed soap to remove any irritants. Rinse the soap off thoroughly.      - You may also take an oatmeal (Aveeno) bath or take an antihistamine medication by mouth to help reduce the itching.  * Oral Antihistamine Medication for Itching      - Take an antihistamine like diphenhydramine (Benadryl) for widespread rashes that itch. The adult dosage of Benadryl is 25-50 mg by mouth 4 times daily.      - An over-the-counter antihistamine that causes less sleepiness is loratadine (e.g., Alavert or Claritin).      - Read the package instructions thoroughly on all medications that you take.  * Reasons To Call Back      - You become worse

## 2016-06-18 NOTE — Telephone Encounter (Signed)
Symptom Triage          Name of PCP Provider: Leane PlattYang, Jenny Zhu   Insurance Coverage Verified: Active  Last office visit: 02/25/16  Next office visit:  none    Who is reporting the symptoms? Patient  What symptom is the patient experiencing? breaking out all over chest, upper part of back, like a rash, painful, itchy. Denies fever.    Is this a new or ongoing symptom? new  Estimated time since experiencing symptom(s)? About two months    Best way to contact patient: [phone] home   Alternative communication method: [phone] home     Has been advised this message with symptoms will be transmitted to triage nurse.

## 2016-06-18 NOTE — Telephone Encounter (Signed)
Patient is calling to have more visits with PT. Pt states it is for the same reason for her back. Please advise.

## 2016-06-19 NOTE — Telephone Encounter (Signed)
tee'd up re-certification of Physical Therapy for chronic low back pain.    Routing to Dr. Threasa BeardsYang for review.

## 2016-06-22 NOTE — Telephone Encounter (Signed)
Noted. Thanks.

## 2016-06-25 ENCOUNTER — Ambulatory Visit (INDEPENDENT_AMBULATORY_CARE_PROVIDER_SITE_OTHER): Payer: Medicaid Other | Admitting: Internal Medicine

## 2016-06-25 VITALS — BP 105/72 | HR 60 | Temp 97.8°F | Ht 70.0 in | Wt 158.0 lb

## 2016-06-25 DIAGNOSIS — R21 Rash and other nonspecific skin eruption: Principal | ICD-10-CM

## 2016-06-25 NOTE — Progress Notes (Signed)
ID/CC:     Chief Complaint   Patient presents with   . Rash     back and chest    . Referral/authorization     PT       SUBJECTIVE: Haley Barrett is a 35 year old female here for regular acute visit.   Has been taking oral contraceptives for years, started initially for acne.  A few months ago, started taking minocycline.  Has had rash on chest, neck, arms, upper back.  Can be itchy, sometimes even painful.  Gets cystic acne at times, has large lesion on L side of neck.  Minocycline prescribed at outside clinic.  Pt plays beach volleyball.  Advised pt of photosensitivity reaction with this antibiotic class.  OCP was doing a moderately good job of controlling patient's acne.    Patient Active Problem List    Diagnosis Date Noted   . Finger dislocation, subsequent encounter 12/23/2015   . ADD (attention deficit disorder) 04/06/2013   . Birth control 04/06/2013       Past Medical History:   Diagnosis Date   . ADD (attention deficit disorder)    . Clavicular fracture        Social History:  Social History   Substance Use Topics   . Smoking status: Never Smoker   . Smokeless tobacco: Never Used   . Alcohol use Yes      Comment: socially, couple drinks/week      Family History   Problem Relation Age of Onset   . Diabetes Paternal Uncle    . Breast Cancer Paternal Aunt    . Breast Cancer Mother 960   . Breast Cancer Paternal Grandmother    . Colon Cancer Neg Hx    . Hypertension Neg Hx    . Cholesterol/Lipid Disorder Neg Hx      Past Surgical History:   Procedure Laterality Date   . ------------OTHER-------------  35 yo    humerus ORIF      Allergies:  No Known Allergies    Current Outpatient Prescriptions   Medication Sig   . Amphetamine-Dextroamphetamine (ADDERALL PO) as needed.   . drospirenone-ethinyl estradiol (YAZ) 3-0.02 MG tablet Take 1 tablet by mouth daily.   . Zolpidem Tartrate (AMBIEN PO) as needed.     No current facility-administered medications for this visit.      ROS:    Constitutional: negative for fever,  chills  CV: negative for chest pain, palpitations  Respiratory: negative for SOB  GI: negative for nausea  Skin: rash in sun exposed areas    Labs and chart reviewed.    Physical Exam:  Gen: no apparent distress  OBJECTIVE:: BP 105/72 (BP Location: Left arm, BP Patient Position: Sitting, BP cuff size: Regular)  Pulse 60  Temp 97.8 F (36.6 C) (Oral)  Ht 5\' 10"  (1.778 m)  Wt 71.7 kg (158 lb)  LMP 06/07/2016 (Approximate)  SpO2 99%  BMI 22.67 kg/m2  General Appearance: healthy, alert, no distress, pleasant affect, cooperative.  Neck: subcutaneous mass/LN, mildly tender to palpation, anterior cervical chain on L  Skin: maculopapular rash on chest, back, neck.     A/P:  Haley MilletMegan was seen today for rash and referral/authorization.    Diagnoses and all orders for this visit:    Rash  -     CBC w/Auto Diff Lavender; Future    Swollen area on neck may be acne lesion vs resolving lymphadenopathy.  Will check CBC.  Pt agrees to stop antibiotic altogether, will  continue OCP.    Patient Instructions   Stop minocycline - your rash is likely from photosensitvity (sun exposure while on this medication)  Continue birth control pill  Labs today - CBC - if abnormal we will contact you  If the bumps on your neck and chest don't go away within the next few weeks, let us know  Use sunscreen regularly      Follow-up as scheduled  Barriers to learning assessed: None.  Patient verbalizes understanding and is agreeable to above plan.

## 2016-06-25 NOTE — Patient Instructions (Signed)
Stop minocycline - your rash is likely from photosensitvity (sun exposure while on this medication)  Continue birth control pill  Labs today - CBC - if abnormal we will contact you  If the bumps on your neck and chest don't go away within the next few weeks, let us know  Use sunscreen regularly

## 2016-07-03 ENCOUNTER — Telehealth (INDEPENDENT_AMBULATORY_CARE_PROVIDER_SITE_OTHER): Payer: Self-pay

## 2016-07-03 NOTE — Telephone Encounter (Signed)
I have attempted to reach the patient and schedule a therapy appointment. I was able to leave a message and provide our main line [phone], so the patient may call us back to schedule at their earliest convenience.       Thank you,  Rehabilitation Services

## 2016-08-06 ENCOUNTER — Ambulatory Visit (HOSPITAL_BASED_OUTPATIENT_CLINIC_OR_DEPARTMENT_OTHER): Payer: Medicaid Other | Admitting: Rehabilitative and Restorative Service Providers"

## 2016-08-06 ENCOUNTER — Telehealth: Payer: Self-pay

## 2016-08-06 NOTE — Telephone Encounter (Signed)
cxl appt for PT Therapy for today per pt's request, she's not feeling well. R/s for 12/14. Informed therapist.

## 2016-08-20 ENCOUNTER — Ambulatory Visit
Admission: RE | Admit: 2016-08-20 | Discharge: 2016-08-21 | Disposition: A | Discharge status: Home / Self Care | Payer: Medicaid Other | Attending: Internal Medicine | Admitting: Internal Medicine

## 2016-08-20 DIAGNOSIS — G8929 Other chronic pain: Secondary | ICD-10-CM | POA: Insufficient documentation

## 2016-08-20 DIAGNOSIS — M545 Low back pain, unspecified: Secondary | ICD-10-CM

## 2016-08-21 NOTE — Interdisciplinary (Signed)
Physical Therapy Progress Assessment Note    Referring Physician: Leane PlattYang, Jenny Zhu               Preferred Language:English    Plan  Patient to continue therapy for     ICD-10-CM ICD-9-CM    1. Chronic midline low back pain without sciatica M54.5 724.2 Physical Therapy - Internal ([place])    G89.29 338.29 Physical Therapy - Internal ()          Therapy Goals                                                                                        Current Level Goals for Episode of Care    Functional Deficit     Long term Functional Goal  Goal (Long Term) : Pt will be able to stand for > 45 minutes without pain to be able to cook and wash dishes.   No. visits: 10-14  Goal status: Continue   Impairment #1   Short Term Goal #1 Custom goal: Pt will be Ind in HEP.   No. visits: 1-3  Goal status: Continue   Impairment #2   Short Term Goal #2 Custom goal: Pt will report a decrease in her symptoms by 15%.   No. visits: 3-5  Goal status: Continue   Impairment #3   Short Term Goal #3     Impairment #4   Short Term Goal #4     Functional Limitation Reporting                                                      PT Progress Report       08/21/16 1500    Assessment     Assessment  Pt returns to PT after getting another referral for back. She returns with an increase in pain. She has been ill and has not been doing HEP. Pt presenting with TTP to lower lumbar region and PSIS. Pt with negative SI compression/distraction and SLR. Pt with equal leg length. Pt was encouraged to get back into doing her HEP. Pt may benefit from PT to help decrease her pain.     Rehab Potential Good      08/21/16 1500    Outpatient Treatment Plan    OP Treatment Frequency 1 time per week    OP Treatment Duration 12 weeks    OP Status of treatment Patient evaluated and will benefit from ongoing skilled therapy      08/21/16 1500    Treatment Plan Discussion    Treatment Plan Discussion & Agreement Patient    Patient/Family Questions Yes -  All questions asked & answered      08/21/16 1500    Patient/Family Education    Learner(s) Patient    Patient/family training in appropriate therapeutic interventions Initiated    Education Topic(s) Exercise Program      08/21/16 1500    Subjective    Status since last treatment  Pt reports her  symptoms changed a bit, feels more and sometimes getting into her B hip,  L is worse. She can't think of anything that might have triggered pain. She says walking increase her pain. She takes ibuprofen.  She hasn't been doing her HEP.       08/21/16 1500    Outpatient Pain Assessment    OP Pain Numeric Pain Rating Scale      08/21/16 1500    Numeric Pain Rating Scale     Pain Intensity - rating at present 7      08/21/16 1500    Treatment provided today    Payor  MediCal      08/21/16 1500    MediCal Treatment completed today    MediCal Treatment initial 30 minutes (x3908) Completed    MediCal Treatment Add 15 min minutes (Z6109(x3910) 1      08/21/16 1500    MediCAL Treatment today    Therapeutic exercise   Lumbar stabilization exercises;Strengthening exercises      Therapeutic exercise   hip ER/IR isometrics, bridges 4x10, prone hip extension x 20 each leg, prone hip abduction with hip extension x30, supine B hip extension with swiss ball w/ SLR x 20, Qped bird dog hip extension and shoulder flexion x 20      08/21/16 1500    Focus for Next Treatment    Focus for Next Treatment Manual therapy;Patient exercise program instruction;Skilled techniques to improve muscle and tissue flexibility;Skilled techniques to improve range of motion;Strengthening;Therapeutic exercise      08/21/16 1500    Treatment Time     Total TIMED Treatment  (min) 45    Total Treatment Time (min) 45            PT Discharge with Treatment Report       08/21/16 1500    Progress report date    Progress report date 08/20/16      08/21/16 1500    Episode Information    Type of Progress Report  Update of Treatment Plan and Goals    Has there been a change in  Diagnosis No    Has there been a change in support system  No      08/21/16 1500    Outpatient Progress report    Patient attendance Good, patient has attended at least 75% of scheduled appointments    Patient compliance with therapy program Good    Symptom progression  Worsened    Response to therapy Good

## 2016-09-11 ENCOUNTER — Ambulatory Visit (HOSPITAL_BASED_OUTPATIENT_CLINIC_OR_DEPARTMENT_OTHER): Payer: Medicaid Other | Admitting: Rehabilitative and Restorative Service Providers"

## 2016-09-15 ENCOUNTER — Ambulatory Visit (HOSPITAL_BASED_OUTPATIENT_CLINIC_OR_DEPARTMENT_OTHER): Payer: Medicaid Other | Admitting: Rehabilitative and Restorative Service Providers"

## 2016-09-18 ENCOUNTER — Telehealth (HOSPITAL_BASED_OUTPATIENT_CLINIC_OR_DEPARTMENT_OTHER): Payer: Self-pay | Admitting: Rehabilitative and Restorative Service Providers"

## 2016-09-18 ENCOUNTER — Ambulatory Visit
Admission: RE | Admit: 2016-09-18 | Discharge: 2016-09-18 | Disposition: A | Discharge status: Home / Self Care | Payer: Medicaid Other | Attending: Internal Medicine | Admitting: Internal Medicine

## 2016-09-18 DIAGNOSIS — G8929 Other chronic pain: Secondary | ICD-10-CM | POA: Insufficient documentation

## 2016-09-18 DIAGNOSIS — M545 Low back pain, unspecified: Secondary | ICD-10-CM

## 2016-09-18 NOTE — Telephone Encounter (Signed)
Pt aware unable to schedule f/u PT appt, insurance auth expires tomorrow 1/13.     Msg sent to Rehab Auth Coordinators to extend [place], cc'd PT provider.     Pt aware we will call her when ready to schedule and to f/u with Rehab if she does not receive a call.

## 2016-09-18 NOTE — Interdisciplinary (Signed)
Physical Therapy Progress Assessment Note    Referring Physician: Beckey RutterLunde, Ottar Viker            Interpreter Services - Rehab: In person  Preferred Language:English    Plan  Patient to continue therapy for     ICD-10-CM ICD-9-CM    1. Chronic midline low back pain without sciatica M54.5 724.2     G89.29 338.29           Therapy Goals                                                                                        Current Level Goals for Episode of Care    Functional Deficit     Long term Functional Goal      Impairment #1   Short Term Goal #1     Impairment #2   Short Term Goal #2     Impairment #3   Short Term Goal #3     Impairment #4   Short Term Goal #4     Functional Limitation Reporting                                                      PT Progress Report       09/18/16 1400    Assessment     Assessment  Pt performed all lumbar stabs with #6 med ball and no C/O increase low back pain    Rehab Potential Good      09/18/16 1400    Outpatient Treatment Plan    OP Treatment Frequency 1 time per week    OP Treatment Duration 12 weeks    OP Status of treatment Patient evaluated and will benefit from ongoing skilled therapy      09/18/16 1400    Treatment Plan Discussion    Treatment Plan Discussion & Agreement Patient    Patient/Family Questions Yes - All questions asked & answered      09/18/16 1400    Patient/Family Education    Learner(s) Patient      09/18/16 1400    Subjective    Status since last treatment  Pt states she has new job and has been siotting a lot      09/18/16 1400    Outpatient Pain Assessment    OP Pain Numeric Pain Rating Scale      09/18/16 1400    Numeric Pain Rating Scale     Pain Intensity - rating at present 4    Pain Intensity - rating at worst  9    Pain Intensity- rating after treatment 1    Frequency  Intermittent    Description  Aching;Throbbing      09/18/16 1400    Treatment provided today    Payor  MediCal      09/18/16 1400    MediCal Treatment completed  today    MediCal Treatment initial 30 minutes (V4098(x3908) Completed    MediCal Treatment Add 15 min minutes (  J1914) 1      09/18/16 1400    MediCAL Treatment today    Therapeutic exercise   Lumbar stabilization exercises;Strengthening exercises      Therapeutic exercise   hip ER/IR isometrics, bridges 4x10, prone hip extension x 20 each leg, prone hip abduction with hip extension x30, supine B hip extension with swiss ball w/ SLR x 20,LTR,bridge,alt kicks, Qped bird dog hip extension and shoulder x 20,heat x 5 min      09/18/16 1400    Focus for Next Treatment    Focus for Next Treatment Manual therapy;Patient exercise program instruction;Skilled techniques to improve muscle and tissue flexibility;Skilled techniques to improve range of motion;Strengthening;Therapeutic exercise      09/18/16 1400    Treatment Time     Total TIMED Treatment  (min) 45    Total Treatment Time (min) 45

## 2016-09-22 ENCOUNTER — Telehealth (HOSPITAL_BASED_OUTPATIENT_CLINIC_OR_DEPARTMENT_OTHER): Payer: Self-pay | Admitting: Rehabilitative and Restorative Service Providers"

## 2016-09-22 NOTE — Telephone Encounter (Signed)
PLEASE BOOK WITH JOHN FOR NEXT VISIT. LAST VISIT WITH JOHN WAS ON 08/20/16    I have attempted to reach the patient and schedule a therapy appointment. I was able to leave a message and provide our main line [phone], so the patient may call us back to schedule at their earliest convenience.      Thank you,  Rehabilitation Services

## 2016-10-02 ENCOUNTER — Ambulatory Visit (HOSPITAL_BASED_OUTPATIENT_CLINIC_OR_DEPARTMENT_OTHER): Payer: Medicaid Other | Admitting: Rehabilitative and Restorative Service Providers"

## 2016-10-05 ENCOUNTER — Ambulatory Visit (HOSPITAL_BASED_OUTPATIENT_CLINIC_OR_DEPARTMENT_OTHER): Payer: Medicaid Other | Admitting: Rehabilitative and Restorative Service Providers"

## 2016-10-09 ENCOUNTER — Ambulatory Visit
Admission: RE | Admit: 2016-10-09 | Discharge: 2016-10-09 | Disposition: A | Discharge status: Home / Self Care | Payer: Medicaid Other | Attending: Internal Medicine | Admitting: Internal Medicine

## 2016-10-09 DIAGNOSIS — M545 Low back pain: Principal | ICD-10-CM | POA: Insufficient documentation

## 2016-10-09 DIAGNOSIS — G8929 Other chronic pain: Secondary | ICD-10-CM | POA: Insufficient documentation

## 2016-10-09 NOTE — Interdisciplinary (Signed)
Physical Therapy Daily Follow up Note    Referring Physician: Beckey RutterLunde, Ottar Viker               Preferred Language:English    Plan  Patient to continue therapy for     ICD-10-CM ICD-9-CM    1. Chronic midline low back pain without sciatica M54.5 724.2     G89.29 338.29                                      PT Progress Report       10/09/16 1100    Assessment     Assessment  Pt returns with overall decrease in pain but pain is still there. Pt is having pain and TTP to L IT band. Pt with some TTP to L PSIS region. Pt with equal leg length. Pt was encouraged to use stationary bike to help decrease tightness to IT band. Pt needs to continue building strength and stability to lumbar and pelvic region.     Rehab Potential Good      10/09/16 1400    Outpatient Treatment Plan    OP Treatment Frequency 1 time per week    OP Treatment Duration 12 weeks    OP Status of treatment Patient evaluated and will benefit from ongoing skilled therapy      10/09/16 1400    Treatment Plan Discussion    Treatment Plan Discussion & Agreement Patient    Patient/Family Questions Yes - All questions asked & answered      10/09/16 1100    Patient/Family Education    Learner(s) Patient    Patient/family training in appropriate therapeutic interventions Ongoing    Education Topic(s) Exercise Program      10/09/16 1100    Subjective    Status since last treatment  Pt reports it;'s ache pain in teh morning. She says she has been getting massages. She began trying to run. She says it's still there. She pain is now localized more to the L PSIS.       10/09/16 1100    Outpatient Pain Assessment    OP Pain Numeric Pain Rating Scale      10/09/16 1100    Numeric Pain Rating Scale     Pain Intensity - rating at present 4      10/09/16 1100    Treatment provided today    Payor  MediCal      10/09/16 1400    Treatment provided today    Payor  MediCal      10/09/16 1100    MediCal Treatment completed today    MediCal Treatment initial 30  minutes (Z6109(x3908) Completed    MediCal Treatment Add 15 min minutes (x3910) 1      10/09/16 1400    MediCal Treatment completed today    MediCal Treatment initial 30 minutes (U0454(x3908) Completed      10/09/16 1100    MediCAL Treatment today    Therapeutic exercise   Lumbar stabilization exercises;Strengthening exercises      10/09/16 1400    MediCAL Treatment today    Therapeutic exercise   Flexibility exercises;Patient education;Strengthening exercises      Therapeutic exercise   long axis distraction to L LE, massage stick to L IT band, IT band stretch, prone hip extension 3x10, upright bike x 10 minutes       10/09/16 1400    Focus  for Next Treatment    Focus for Next Treatment Manual therapy;Patient exercise program instruction;Skilled techniques to improve muscle and tissue flexibility;Skilled techniques to improve range of motion;Strengthening;Therapeutic exercise      10/09/16 1400    Treatment Time     Total TIMED Treatment  (min) 45    Total Treatment Time (min) 45

## 2016-10-13 ENCOUNTER — Ambulatory Visit (HOSPITAL_BASED_OUTPATIENT_CLINIC_OR_DEPARTMENT_OTHER)
Admit: 2016-10-13 | Discharge: 2016-10-14 | Payer: Medicaid Other | Admitting: Rehabilitative and Restorative Service Providers"

## 2016-10-13 DIAGNOSIS — M545 Low back pain, unspecified: Secondary | ICD-10-CM

## 2016-10-13 DIAGNOSIS — G8929 Other chronic pain: Principal | ICD-10-CM

## 2016-10-14 NOTE — Interdisciplinary (Signed)
Physical Therapy Daily Follow up Note    Referring Physician: Beckey RutterLunde, Ottar Viker               Preferred Language:English    Plan  Patient to continue therapy for     ICD-10-CM ICD-9-CM    1. Chronic midline low back pain without sciatica M54.5 724.2     G89.29 338.29                                                      PT Progress Report       10/14/16 1500    Assessment     Assessment  Pt with minimal pain to her lumbar region. Pt with some continued TTP and tightness to her L IT band. Pt was able to perform new exercises without an increase in pain. Pt was instructed to add new exercises only if she does not develop any increase in back pain. Pt with good understanding of instructions.     Rehab Potential Good      10/14/16 1500    Outpatient Treatment Plan    OP Treatment Frequency 1 time per week    OP Treatment Duration 12 weeks    OP Status of treatment Patient evaluated and will benefit from ongoing skilled therapy      10/14/16 1500    Treatment Plan Discussion    Treatment Plan Discussion & Agreement Patient    Patient/Family Questions Yes - All questions asked & answered      10/14/16 1500    Patient/Family Education    Learner(s) Patient    Patient/family training in appropriate therapeutic interventions Ongoing    Education Topic(s) Exercise Program      10/14/16 1500    Subjective    Status since last treatment  Pt reports she hasn't been feeling her back pain as much the last couple of days.       10/14/16 1500    Outpatient Pain Assessment    OP Pain Numeric Pain Rating Scale      10/14/16 1500    Treatment provided today    Payor  MediCal      10/14/16 1500    MediCal Treatment completed today    MediCal Treatment initial 30 minutes (Z6109(x3908) Completed      10/14/16 1500    MediCAL Treatment today    Therapeutic exercise   Flexibility exercises;Patient education;Strengthening exercises      Therapeutic exercise   massage stick to L IT band, IT band stretch, rolling from supine<>prone,  prone over swiss ball lumbar extension 3x10, prone over swiss ball hip extension 3x10      10/14/16 1500    Focus for Next Treatment    Focus for Next Treatment Manual therapy;Patient exercise program instruction;Skilled techniques to improve muscle and tissue flexibility;Skilled techniques to improve range of motion;Strengthening;Therapeutic exercise      10/14/16 1500    Treatment Time     Total TIMED Treatment  (min) 30    Total Treatment Time (min) 30

## 2016-10-27 ENCOUNTER — Ambulatory Visit (HOSPITAL_BASED_OUTPATIENT_CLINIC_OR_DEPARTMENT_OTHER)
Admit: 2016-10-27 | Discharge: 2016-10-28 | Payer: Medicaid Other | Admitting: Rehabilitative and Restorative Service Providers"

## 2016-10-27 DIAGNOSIS — M545 Low back pain, unspecified: Secondary | ICD-10-CM

## 2016-10-27 DIAGNOSIS — G8929 Other chronic pain: Principal | ICD-10-CM

## 2016-10-28 NOTE — Interdisciplinary (Signed)
Physical Therapy Daily Follow up Note    Referring Physician: Beckey RutterLunde, Ottar Viker               Preferred Language:English    Plan  Patient to continue therapy for     ICD-10-CM ICD-9-CM    1. Chronic midline low back pain without sciatica M54.5 724.2     G89.29 338.29                      PT Progress Report       10/28/16 0800    Assessment     Assessment  Pt returns with an increase in pain due to playing volleyball for a long period of time. Pt with continued TTP to L IT band. Pt was able to perform exercises without an increase in pain and pt reported feeling better after treatment. Pt was instructed to avoid playing volleyball for long period of time.     Rehab Potential Good      10/28/16 0800    Outpatient Treatment Plan    OP Treatment Frequency 1 time per week    OP Treatment Duration 12 weeks    OP Status of treatment Patient evaluated and will benefit from ongoing skilled therapy      10/28/16 0800    Treatment Plan Discussion    Treatment Plan Discussion & Agreement Patient    Patient/Family Questions Yes - All questions asked & answered      10/28/16 0800    Subjective    Status since last treatment  Pt reports a couple of saturday ago, played a lot of volleyball and aggravated her back. She says she did not have an increase in pain after last visit.       10/28/16 0800    Outpatient Pain Assessment    OP Pain Numeric Pain Rating Scale      10/28/16 0800    Treatment provided today    Payor  MediCal      10/28/16 0800    MediCal Treatment completed today    MediCal Treatment initial 30 minutes (Z6109(x3908) Completed      10/28/16 0800    MediCAL Treatment today    Therapeutic exercise   Flexibility exercises;Patient education;Strengthening exercises      Therapeutic exercise   LTR w/ swiss ball, bridge with swiss ball 3x10, rolling supine<>sit 2x10, massage stick to L IT band, IT band stretching, bird dog 3x10, prone over swiss ball B hip extension 3x10, prone over swiss ball trunk extension  3x10, TRX B shoulder extension for core activation 3x10,       10/28/16 0800    Focus for Next Treatment    Focus for Next Treatment Manual therapy;Patient exercise program instruction;Skilled techniques to improve muscle and tissue flexibility;Skilled techniques to improve range of motion;Strengthening;Therapeutic exercise      10/28/16 0800    Treatment Time     Total TIMED Treatment  (min) 30    Total Treatment Time (min) 30

## 2016-11-03 ENCOUNTER — Ambulatory Visit (HOSPITAL_BASED_OUTPATIENT_CLINIC_OR_DEPARTMENT_OTHER)
Admit: 2016-11-03 | Discharge: 2016-11-04 | Payer: Medicaid Other | Admitting: Rehabilitative and Restorative Service Providers"

## 2016-11-03 DIAGNOSIS — G8929 Other chronic pain: Principal | ICD-10-CM

## 2016-11-03 DIAGNOSIS — M545 Low back pain, unspecified: Secondary | ICD-10-CM

## 2016-11-04 NOTE — Interdisciplinary (Signed)
Physical Therapy Daily Follow up Note    Referring Physician: Beckey RutterLunde, Ottar Viker               Preferred Language:English    Plan  Patient to continue therapy for     ICD-10-CM ICD-9-CM    1. Chronic midline low back pain without sciatica M54.5 724.2     G89.29 338.29                     PT Progress Report       11/04/16 1400    Assessment     Assessment  Pt with continued pain to her lumbar region. Pt was reminded on the importance of not over doing it with playing volleyball. Pt with a slight leg length discrepancy corrected with long axis distraction. Pt with no increase in pain during treatment      11/04/16 1500    Outpatient Treatment Plan    OP Treatment Frequency 1 time per week    OP Treatment Duration 12 weeks    OP Status of treatment Patient evaluated and will benefit from ongoing skilled therapy      11/04/16 1500    Treatment Plan Discussion    Treatment Plan Discussion & Agreement Patient    Patient/Family Questions Yes - All questions asked & answered      11/04/16 1400    Subjective    Status since last treatment  Pt reports she played 6 volleyball games. She still continues to have back pain.       11/04/16 1500    Treatment provided today    Payor  MediCal      11/04/16 1500    MediCal Treatment completed today    MediCal Treatment initial 30 minutes (Z6109(x3908) Completed      11/04/16 1500    MediCAL Treatment today      Manual therapy  long axis distraction to R LE    Therapeutic exercise   Flexibility exercises;Patient education;Strengthening exercises      Therapeutic exercise   STM to L IT band, power tower B shoulder flexion 13 degrees core stabilization 3x10, bird dog 3 x10, prone over swiss ball B hip extension  3x10, anti rotation L65lbs 3x10    Hot/Cold Packs Moist hot pack application      11/04/16 1500    Focus for Next Treatment    Focus for Next Treatment Manual therapy;Patient exercise program instruction;Skilled techniques to improve muscle and tissue  flexibility;Skilled techniques to improve range of motion;Strengthening;Therapeutic exercise      11/04/16 1500    Treatment Time     Total TIMED Treatment  (min) 30    Total Treatment Time (min) 45

## 2016-11-24 ENCOUNTER — Telehealth: Payer: Self-pay

## 2016-11-24 NOTE — Telephone Encounter (Signed)
Patient calling in requesting if Jonny RuizJohn can pend an order for additional visits [facility]. Patients referral expires on 11/28/16.    Routing to [employer].

## 2016-11-25 ENCOUNTER — Encounter (INDEPENDENT_AMBULATORY_CARE_PROVIDER_SITE_OTHER): Payer: Self-pay | Admitting: Internal Medicine

## 2016-11-25 DIAGNOSIS — M545 Low back pain: Principal | ICD-10-CM

## 2016-11-25 DIAGNOSIS — G8929 Other chronic pain: Principal | ICD-10-CM

## 2016-11-26 NOTE — Progress Notes (Signed)
Cont Physical Therapy

## 2016-11-28 ENCOUNTER — Telehealth (HOSPITAL_BASED_OUTPATIENT_CLINIC_OR_DEPARTMENT_OTHER): Payer: Self-pay

## 2016-11-28 NOTE — Telephone Encounter (Signed)
I have attempted to reach the patient and schedule a therapy appointment. I was able to leave a message and provide our main line [phone], so the patient may call us back to schedule at their earliest convenience.

## 2016-12-29 ENCOUNTER — Ambulatory Visit
Admission: RE | Admit: 2016-12-29 | Discharge: 2016-12-31 | Disposition: A | Discharge status: Home / Self Care | Payer: Medicaid Other | Attending: Internal Medicine | Admitting: Internal Medicine

## 2016-12-29 DIAGNOSIS — G8929 Other chronic pain: Secondary | ICD-10-CM | POA: Insufficient documentation

## 2016-12-29 DIAGNOSIS — M545 Low back pain: Principal | ICD-10-CM | POA: Insufficient documentation

## 2016-12-29 NOTE — Interdisciplinary (Signed)
Physical Therapy Progress Note    Referring Physician: Tera Mater           Preferred [place]    Patient to continue therapy for     ICD-10-CM ICD-9-CM    1. Chronic bilateral low back pain without sciatica M54.5 724.2     G89.29 338.29              SUBJECTIVE AND OBJECTIVE       12/29/16 1500       Subjective    Status since last treatment  Pt reports she has not been keeping up with her HEP other than stretching while playing volleyball due to being on vacation for the past couple of weeks. Pt reports she has been able to play volleyball with less aggravation of symptoms and has been stretching more between games. Pt reports pain in her left hip around greater trochanter.        12/29/16 1500       Outpatient Pain Assessment    OP Pain Numeric Pain Rating Scale       12/29/16 1500       Numeric Pain Rating Scale     Pain Intensity - rating at present 0     Pain Intensity - rating at worst  9     Frequency  Intermittent     Description  Aching;Throbbing                                             ASSESSMENT AND PLAN       12/29/16 1500       Assessment     Assessment  Pt presents with increased symptoms during lumbar extension. Pt c/o pain in her left hip/low back during treatment today and had difficulty doing march test. Pt presented with posteriorly rotated left innominate during examination and was corrected with MET and isometric adductions. Pt felt a decrease in symptoms with march test after treatment. Pt also with   TTP to IT band.  Pt was instructed to start her HEP again. Pt will benefit from PT to help decrease symptoms and improve functional activity tolerance. Pt's treatment was limited due to patient having to leave early. Continue with previous goals.        12/29/16 1500       Treatment provided today    Payor  MediCal       12/29/16 1500       MediCal Treatment completed today    MediCal Treatment initial 30 minutes (T5176) Completed       12/29/16 1500       MediCAL Treatment today    Manual therapy  Soft tissue mobilization;Muscle energy techniques       12/29/16 1500       Treatment Time     Total TIMED Treatment  (min) 30     Total Treatment Time (min) 30

## 2017-01-05 ENCOUNTER — Ambulatory Visit
Admission: RE | Admit: 2017-01-05 | Discharge: 2017-01-05 | Disposition: A | Discharge status: Home / Self Care | Payer: Medicaid Other | Attending: Internal Medicine | Admitting: Internal Medicine

## 2017-01-05 DIAGNOSIS — M545 Low back pain: Principal | ICD-10-CM | POA: Insufficient documentation

## 2017-01-05 DIAGNOSIS — G8929 Other chronic pain: Secondary | ICD-10-CM | POA: Insufficient documentation

## 2017-01-05 NOTE — Interdisciplinary (Signed)
Physical Therapy Daily Follow up Note    Referring Physician: Tera Mater           Preferred [place]    Patient to continue therapy for     ICD-10-CM ICD-9-CM    1. Chronic bilateral low back pain without sciatica M54.5 724.2     G89.29 338.29              SUBJECTIVE AND OBJECTIVE       01/05/17 1300       Subjective    Status since last treatment  Pt states L lateral hip is bothering her more than low back today,Pt states intermitten compliance with HEP,and feels  like yoga irritated back this morning       01/05/17 1300       Outpatient Pain Assessment    OP Pain Numeric Pain Rating Scale       01/05/17 1300       Numeric Pain Rating Scale     Pain Intensity - rating at present 0     Pain Intensity - rating at worst  9     Pain Intensity- rating after treatment 1     Frequency  Intermittent     Description  Aching;Throbbing       01/05/17 1300       Objective Findings    Objective Findings L up slip ASIS                                             ASSESSMENT AND PLAN       01/05/17 1300       Assessment     Assessment  Pt cont with intermitten pain of zero at rest to max pain with unexpected movements,slight relief after Rx session and heat x 10 min     Rehab Potential Good       01/05/17 1300       Outpatient Treatment Plan    OP Treatment Frequency 1 time per week     OP Treatment Duration 12 weeks     OP Status of treatment Patient evaluated and will benefit from ongoing skilled therapy       01/05/17 1300       Focus for Next Treatment    Focus for Next Treatment Manual therapy;Patient exercise program instruction;Skilled techniques to improve muscle and tissue flexibility;Skilled techniques to improve range of motion;Strengthening;Therapeutic exercise       01/05/17 1300       Treatment Plan Discussion    Treatment Plan Discussion & Agreement Patient     Patient/Family Questions Yes - All questions asked & answered       01/05/17 1300       Treatment provided today    Payor   MediCal       01/05/17 1300       MediCal Treatment completed today    MediCal Treatment initial 30 minutes (C5852) Completed       01/05/17 1300       MediCAL Treatment today    Therapeutic exercise   Flexibility exercises;Patient education;Strengthening exercises       Therapeutic exercise   SB LTR,bridge,kicks,curls,HS str Piriformis str QL str,hip flexore str MET ASIS.hear x 10 min       01/05/17 1300       Treatment Time     Total  TIMED Treatment  (min) 30     Total Treatment Time (min) 30

## 2017-01-08 ENCOUNTER — Telehealth (HOSPITAL_BASED_OUTPATIENT_CLINIC_OR_DEPARTMENT_OTHER): Payer: Self-pay

## 2017-01-08 NOTE — Telephone Encounter (Signed)
Called to cancel PT appointment on 5/8 due to the strike.  LM on phone number [phone] using rescheduling script with call center number to call and reschedule

## 2017-01-12 ENCOUNTER — Ambulatory Visit (HOSPITAL_BASED_OUTPATIENT_CLINIC_OR_DEPARTMENT_OTHER): Payer: Medicaid Other | Admitting: Rehabilitative and Restorative Service Providers"

## 2017-01-19 ENCOUNTER — Ambulatory Visit (HOSPITAL_BASED_OUTPATIENT_CLINIC_OR_DEPARTMENT_OTHER)
Admit: 2017-01-19 | Discharge: 2017-01-20 | Payer: Medicaid Other | Admitting: Rehabilitative and Restorative Service Providers"

## 2017-01-19 DIAGNOSIS — M545 Low back pain: Principal | ICD-10-CM

## 2017-01-19 DIAGNOSIS — G8929 Other chronic pain: Principal | ICD-10-CM

## 2017-01-19 NOTE — Interdisciplinary (Signed)
Physical Therapy Daily Follow up Note    Referring Physician: Tera Mater           Preferred [place]    Patient to continue therapy for     ICD-10-CM ICD-9-CM    1. Chronic midline low back pain without sciatica M54.5 724.2     G89.29 338.29              SUBJECTIVE AND OBJECTIVE       01/19/17 1500       Subjective    Status since last treatment  Pt reports her overall pain is better especially in the mornings but still gets pain throughout the day. Pt states her pain is worst while playing volleyball. Pt reports she has cut back to 2x/wk. Pt states she has been doing her stretches regularly but has not been doing her strengthening exercises.        01/19/17 1500       Outpatient Pain Assessment    OP Pain Numeric Pain Rating Scale       01/19/17 1500       Numeric Pain Rating Scale     Pain Intensity - rating at present 0     Pain Intensity - rating at worst  4     Frequency  Intermittent     Description  Aching;Throbbing     Location  Left low back                                             ASSESSMENT AND PLAN       01/19/17 1500       Assessment     Assessment  Pt presents with left innominate post rotation. Pt felt better with MET and isometric adduction. Pt reported pain on her left with PA's to L5 and sacrum but felt no pain after grade 1 PA's. Pt was educated on continuing her stretching and strengthening for her core and low back.Pt will benefit from PT to improve strength of core/ low back to improve functional acitivty tolerance.        01/19/17 1500       Treatment provided today    Payor  MediCal       01/19/17 1500       MediCal Treatment completed today    MediCal Treatment initial 30 minutes (x3908) Completed     MediCal Treatment Add 15 min minutes (Y8502) 1       01/19/17 1500       MediCAL Treatment today      Manual therapy  Roller to It band, MET 6x6secs, iso add 4x4secs, long axis distraction, PA's L5/sacrum     Therapeutic exercise   Strengthening exercises          Therapeutic exercise   Dead bugs 3x10, bridge 3x10, single leg bridge 3x10, biodex stepper x9mns.        01/19/17 1500       Treatment Time     Total TIMED Treatment  (min) 45     Total Treatment Time (min) 45

## 2017-01-26 ENCOUNTER — Ambulatory Visit (HOSPITAL_BASED_OUTPATIENT_CLINIC_OR_DEPARTMENT_OTHER)
Admit: 2017-01-26 | Discharge: 2017-01-26 | Payer: Medicaid Other | Admitting: Rehabilitative and Restorative Service Providers"

## 2017-01-26 DIAGNOSIS — M545 Low back pain: Principal | ICD-10-CM

## 2017-01-26 DIAGNOSIS — G8929 Other chronic pain: Principal | ICD-10-CM

## 2017-01-26 NOTE — Interdisciplinary (Signed)
Physical Therapy Daily Follow up Note    Referring Physician: Tera Mater           Preferred [place]    Patient to continue therapy for     ICD-10-CM ICD-9-CM    1. Chronic midline low back pain without sciatica M54.5 724.2     G89.29 338.29              SUBJECTIVE AND OBJECTIVE       01/26/17 1300       Subjective    Status since last treatment  Pt reports compliance with HEP and feeling a little less pain in low back       01/26/17 1300       Outpatient Pain Assessment    OP Pain Numeric Pain Rating Scale       01/26/17 1300       Numeric Pain Rating Scale     Pain Intensity - rating at present 4     Pain Intensity - rating at worst  4     Pain Intensity- rating after treatment 1     Frequency  Intermittent     Description  Aching;Throbbing     Location  Left low back       01/26/17 1300       Objective Findings    Objective Findings L up slip ASIS                                             ASSESSMENT AND PLAN       01/26/17 1300       Assessment     Assessment  Pt performed all therex and stretches with moderate L hip relief ,no SI pain only lateral L hip discomfort     Rehab Potential Good       01/26/17 1300       Outpatient Treatment Plan    OP Treatment Frequency 1 time per week     OP Treatment Duration 12 weeks     OP Status of treatment Patient evaluated and will benefit from ongoing skilled therapy       01/26/17 1300       Focus for Next Treatment    Focus for Next Treatment Manual therapy;Patient exercise program instruction;Skilled techniques to improve muscle and tissue flexibility;Skilled techniques to improve range of motion;Strengthening;Therapeutic exercise       01/26/17 1300       Treatment Plan Discussion    Treatment Plan Discussion & Agreement Patient     Patient/Family Questions Yes - All questions asked & answered       01/26/17 1300       Treatment provided today    Payor  MediCal       01/26/17 1300       MediCal Treatment completed today    MediCal  Treatment initial 30 minutes (x3908) Completed       01/26/17 1300       MediCAL Treatment today    Manual therapy  Soft tissue mobilization;Muscle energy techniques       Manual therapy  MET L Up slip ,roller ITB     Therapeutic exercise   Strengthening exercises       Therapeutic exercise   SB  with 7lbs med ball lumbar stabs,LTR,bridge,alt kicks,crutches,long axis str       01/26/17  1300       Treatment Time     Total TIMED Treatment  (min) 30     Total Treatment Time (min) 30

## 2017-02-02 ENCOUNTER — Ambulatory Visit (HOSPITAL_BASED_OUTPATIENT_CLINIC_OR_DEPARTMENT_OTHER)
Admit: 2017-02-02 | Discharge: 2017-02-02 | Payer: Medicaid Other | Admitting: Rehabilitative and Restorative Service Providers"

## 2017-02-02 DIAGNOSIS — M545 Low back pain, unspecified: Secondary | ICD-10-CM

## 2017-02-02 DIAGNOSIS — G8929 Other chronic pain: Principal | ICD-10-CM

## 2017-02-02 NOTE — Interdisciplinary (Signed)
Physical Therapy Daily Follow up Note    Referring Physician: Tera Mater           Preferred [place]    Patient to continue therapy for     ICD-10-CM ICD-9-CM    1. Chronic midline low back pain without sciatica M54.5 724.2     G89.29 338.29              SUBJECTIVE AND OBJECTIVE       02/02/17 1400       Subjective    Status since last treatment  Pt states feeling a little stiff 2/2 playing volleyball yesterday       02/02/17 1400       Outpatient Pain Assessment    OP Pain Numeric Pain Rating Scale       02/02/17 1400       Numeric Pain Rating Scale     Pain Intensity - rating at present 4     Pain Intensity - rating at worst  4     Pain Intensity- rating after treatment 1     Frequency  Intermittent     Description  Aching;Throbbing     Location  Left low back       02/02/17 1400       Objective Findings    Objective Findings ASIS even today                                             ASSESSMENT AND PLAN       02/02/17 1400       Assessment     Assessment  Pt cont with lumbar stabs and core strengthening ,no need for MET today,decrease back stiffnes after Rx session     Rehab Potential Good       02/02/17 1400       Outpatient Treatment Plan    OP Treatment Frequency 1 time per week     OP Treatment Duration 12 weeks     OP Status of treatment Patient evaluated and will benefit from ongoing skilled therapy       02/02/17 1400       Focus for Next Treatment    Focus for Next Treatment Manual therapy;Patient exercise program instruction;Skilled techniques to improve muscle and tissue flexibility;Skilled techniques to improve range of motion;Strengthening;Therapeutic exercise       02/02/17 1400       Treatment Plan Discussion    Treatment Plan Discussion & Agreement Patient     Patient/Family Questions Yes - All questions asked & answered       02/02/17 1400       Treatment provided today    Payor  MediCal       02/02/17 1400       MediCal Treatment completed today    MediCal  Treatment initial 30 minutes (U2025) Completed       02/02/17 1400       MediCAL Treatment today    Therapeutic exercise   Strengthening exercises       Therapeutic exercise   SB  with 7lbs med ball lumbar stabs,LTR,bridge,alt kicks,crutches,long axis str,all therex per last session       02/02/17 1400       Treatment Time

## 2017-02-16 ENCOUNTER — Ambulatory Visit
Admission: RE | Admit: 2017-02-16 | Discharge: 2017-02-16 | Disposition: A | Discharge status: Home / Self Care | Payer: Medicaid Other | Attending: Internal Medicine | Admitting: Internal Medicine

## 2017-02-16 ENCOUNTER — Other Ambulatory Visit (INDEPENDENT_AMBULATORY_CARE_PROVIDER_SITE_OTHER): Payer: Self-pay | Admitting: Internal Medicine

## 2017-02-16 DIAGNOSIS — IMO0001 Reserved for inherently not codable concepts without codable children: Principal | ICD-10-CM

## 2017-02-16 DIAGNOSIS — M545 Low back pain, unspecified: Secondary | ICD-10-CM

## 2017-02-16 DIAGNOSIS — G8929 Other chronic pain: Secondary | ICD-10-CM | POA: Insufficient documentation

## 2017-02-16 NOTE — Interdisciplinary (Signed)
Physical Therapy Daily Follow up Note    Referring Physician: Beckey RutterLunde, Ottar Viker           Preferred Language:English    Patient to continue therapy for     ICD-10-CM ICD-9-CM    1. Chronic midline low back pain without sciatica M54.5 724.2     G89.29 338.29              SUBJECTIVE AND OBJECTIVE       02/16/17 1300       Subjective    Status since last treatment  pt states back stiff 2/2 long flight over weekend but back felt better after playing volleyball        02/16/17 1300       Outpatient Pain Assessment    OP Pain Numeric Pain Rating Scale       02/16/17 1300       Numeric Pain Rating Scale     Pain Intensity - rating at present 4     Pain Intensity - rating at worst  4     Pain Intensity- rating after treatment 1     Frequency  Intermittent     Description  Aching;Throbbing     Location  Left low back                                             ASSESSMENT AND PLAN       02/16/17 1300       Assessment     Assessment  Pt performed all lumbar stabs and core threx with out pain increase  and improved lumbar/QL mobility Pt progerssing well with back rehab     Rehab Potential Good       02/16/17 1300       Outpatient Treatment Plan    OP Treatment Frequency 1 time per week     OP Treatment Duration 12 weeks     OP Status of treatment Patient evaluated and will benefit from ongoing skilled therapy       02/16/17 1300       Focus for Next Treatment    Focus for Next Treatment Manual therapy;Patient exercise program instruction;Skilled techniques to improve muscle and tissue flexibility;Skilled techniques to improve range of motion;Strengthening;Therapeutic exercise       02/16/17 1300       Treatment Plan Discussion    Treatment Plan Discussion & Agreement Patient     Patient/Family Questions Yes - All questions asked & answered       02/16/17 1300       Treatment provided today    Payor  MediCal       02/16/17 1300       MediCal Treatment completed today    MediCal Treatment initial 30 minutes  (Z6109(x3908) Completed       02/16/17 1300       MediCAL Treatment today    Therapeutic exercise   Strengthening exercises       Therapeutic exercise   SB  with 7lbs med ball lumbar stabs,LTR,bridge,alt kicks,crutches,long axis str,prone alt UE/LE ex t x 30 prone hip ext x 10 B,quad trunk rotation x 20       02/16/17 1300       Treatment Time     Total TIMED Treatment  (min) 30     Total Treatment Time (min) 30

## 2017-02-17 MED ORDER — DROSPIRENONE-ETHINYL ESTRADIOL 3-0.02 MG OR TABS
1.0000 | ORAL_TABLET | Freq: Every day | ORAL | 1 refills | Status: DC
Start: 2017-02-17 — End: 2017-07-13

## 2017-02-17 NOTE — Telephone Encounter (Signed)
Haley Barrett FileShannon Christine Swedeen, CPhT  ([facility])      Oral Contraceptive Refill Protocol    Last visit: 06/25/16 rash     Last pertinent visit: 06/25/16  Next f/u appt due:  none  Next scheduled appointment: 02/23/17 f/u    Per ov 06/25/16  Has been taking oral contraceptives for years, started initially for acne  Swollen area on neck may be acne lesion vs resolving lymphadenopathy.  Will check CBC.  Pt agrees to stop antibiotic altogether, will continue OCP.  Continue birth control pill    Needs labs   LABS required:  (Q year Lipid Panel - if hx HLD ONLY)    Lab Results   Component Value Date    CHOL 225 (H) 12/17/2015    TRIG 112 12/17/2015    HDL 104 12/17/2015    NHDLV 121 12/17/2015    LDLCALC 99 12/17/2015          Monitoring required:   (Q year BP)    (Q year pap smear - varies by age, see below)    (BP range: Systolic=90-150  ZOXWRUEAV=40-98Diastolic=50-90)  Blood Pressure   06/25/16 105/72   02/25/16 110/64   12/23/15 118/76       (Pap smear guidelines):   Age < 21 --  none required   Age > 21 --  Q year until three normal, then Q 3 years until age 36   Age > 1165  --  none required    Last pap smear: 06/15/13   Result:  normal  Next due per HM:  06/15/16

## 2017-02-17 NOTE — Telephone Encounter (Signed)
From: Haley Barrett  To: Beckey RutterLunde, Ottar Viker, MD  Sent: 02/16/2017 11:18 PM PDT  Subject: Medication Renewal Request    Original authorizing provider: Beckey Rutterttar Viker Lunde, MD    Haley HoehnMegan Barrett would like a refill of the following medications:  drospirenone-ethinyl estradiol (YAZ) 3-0.02 MG tablet Haley Barrett[Ottar Faythe DingwallViker Lunde, MD]    Preferred pharmacy: [id number] - [place], [place] - [address]    Comment:

## 2017-02-23 ENCOUNTER — Encounter (INDEPENDENT_AMBULATORY_CARE_PROVIDER_SITE_OTHER): Payer: Medicaid Other | Admitting: Internal Medicine

## 2017-03-01 ENCOUNTER — Ambulatory Visit (INDEPENDENT_AMBULATORY_CARE_PROVIDER_SITE_OTHER): Payer: Medicaid Other | Admitting: Internal Medicine

## 2017-03-01 ENCOUNTER — Encounter (INDEPENDENT_AMBULATORY_CARE_PROVIDER_SITE_OTHER): Payer: Self-pay | Admitting: Internal Medicine

## 2017-03-01 ENCOUNTER — Telehealth (INDEPENDENT_AMBULATORY_CARE_PROVIDER_SITE_OTHER): Payer: Self-pay | Admitting: Internal Medicine

## 2017-03-01 VITALS — BP 114/76 | HR 66 | Temp 97.9°F | Resp 14 | Ht 70.0 in | Wt 167.0 lb

## 2017-03-01 DIAGNOSIS — Z309 Encounter for contraceptive management, unspecified: Secondary | ICD-10-CM

## 2017-03-01 DIAGNOSIS — F988 Other specified behavioral and emotional disorders with onset usually occurring in childhood and adolescence: Secondary | ICD-10-CM

## 2017-03-01 DIAGNOSIS — M545 Low back pain: Principal | ICD-10-CM

## 2017-03-01 DIAGNOSIS — G4700 Insomnia, unspecified: Secondary | ICD-10-CM

## 2017-03-01 DIAGNOSIS — G8929 Other chronic pain: Principal | ICD-10-CM

## 2017-03-01 DIAGNOSIS — Z0000 Encounter for general adult medical examination without abnormal findings: Secondary | ICD-10-CM

## 2017-03-01 DIAGNOSIS — Z113 Encounter for screening for infections with a predominantly sexual mode of transmission: Secondary | ICD-10-CM

## 2017-03-01 MED ORDER — ZOLPIDEM TARTRATE 10 MG OR TABS
10.0000 mg | ORAL_TABLET | Freq: Every evening | ORAL | 0 refills | Status: DC | PRN
Start: 2017-03-01 — End: 2018-05-17

## 2017-03-01 NOTE — Telephone Encounter (Signed)
Candace from [facility] calling to get verbal authorization for zolpidem (AMBIEN) 10 MG tablet. Please call [phone] to authorize. Please advise.

## 2017-03-01 NOTE — Progress Notes (Signed)
Internal Medicine Clinic - Progress Note    Chief Complaint   Patient presents with    Physical    Back Pain     chronic lower back pain     Referral/authorization     Physical Therapy for lower back pain        HPI: Haley Barrett is a 36 year old female with history of ADD, insomnia and lower back pain who presents for a physical examination.     Patient states that her health has been overall "good" throughout the year. She reports worsening acne for which she sees a dermatologist. She had good relief with minocycline but was developing a sun related rash as she frequently plays beach volleyball. She states that she has been getting comedones on her chest, arms and face. This same issue happened with her mother until she went through menopause. She is currently taking an ocp which helps somewhat with her symptoms.     Has chronic left sided lower back pain. Worsened with activities like volleyball. She states that Pt has helped in the past. No numbness, weakness or shooting pains. She states that sometimes it feels like her skin on her hip is burning, but that lasts only seconds.     Seeing an outside psychiatrist for her ADD. She takes aderoll as needed only needs it 1-2x a week. Symptoms are well controlled currently. Would like to get her medications through her current psychiatrist.     Insomnia: takes a quarter of a pill nightly of ambien. States that she needs it to fall asleep. If she does not take it she will take 45 minutes to fall asleep. No weird dreams, no sleep walking, no nightmares. Tries her best to use good sleep hyegine. Used benadryl in the past with no effect. Is open to seeing a sleep specialist in the future as an alterative to [place].     Has a new sexual partner. No dysuria, fever or discharge. Has not used condoms during intercourse. Would like to be screened for STIs.       Past Medical History:   Diagnosis Date    ADD (attention deficit disorder)     Clavicular fracture        Past  Surgical History:   Procedure Laterality Date    ------------OTHER-------------  36 yo    humerus ORIF        Current Outpatient Prescriptions   Medication Sig    Amphetamine-Dextroamphetamine (ADDERALL PO) as needed.    drospirenone-ethinyl estradiol (YAZ) 3-0.02 MG tablet Take 1 tablet by mouth daily.    Zolpidem Tartrate (AMBIEN PO) as needed.     No current facility-administered medications for this visit.        No Known Allergies    ROS:      General: No fever, chills, night sweats or weight loss  Eyes:No vision changes   ENT: No hearing changes, sore throat  Cardiovascular: No chest pain or palpitations  Pulmonary: No shortness of breath, cough,  Gastrointestinal: No nausea, vomiting, diarrhea, constipation, abdominal pain  Genitourinary: No dysuria or hematuria   Neurological: No headache or confusion    Physical Exam:  BP 114/76 (BP Location: Left arm, BP Patient Position: Sitting, BP cuff size: Regular)   Pulse 66   Temp 97.9 F (36.6 C) (Oral)   Resp 14   Ht _0  (1.778 m)   Wt 75.8 kg (167 lb)   SpO2 99%   BMI 23.96 kg/m2  Gen: Alert, oriented, well appearing, in NAD  HEENT: MMM, EOMI, PERRL, oropharynx clear  Neck: supple, no thyromegaly  Cardiac: regular rate and rhythm, no M/R/G  Respiratory: CTAB, no wheezes  Abdomen: Soft, NTND, NABS, no rebound or guarding, no masses  Lymph: No cervical, submandibular lymphadenopathy  Skin: No rashes or lesions  Extremities: warm and well perfused, no edema. No back tenderness on palpation, no bony deformations along spine, ROM intact, straight leg test negative bilaterally.     Labs:    Lab Results   Component Value Date    WBC 8.0 12/17/2015    RBC 4.56 12/17/2015    HGB 13.9 12/17/2015    HCT 41.7 12/17/2015    MCV 91.4 12/17/2015    MCHC 33.3 12/17/2015    RDW 12.4 12/17/2015    PLT 316 12/17/2015    MPV 9.2 (L) 12/17/2015       Lab Results   Component Value Date    NA 139 12/17/2015    K 3.9 12/17/2015    CL 99 12/17/2015    BICARB 21 (L) 12/17/2015     BUN 13 12/17/2015    CREAT 0.83 12/17/2015    GLU 109 (H) 12/17/2015    Ravine 9.4 12/17/2015       Lab Results   Component Value Date    AST 29 12/17/2015    ALT 30 12/17/2015    ALK 62 12/17/2015    TP 7.7 12/17/2015    ALB 4.4 12/17/2015    TBILI 0.28 12/17/2015       No results found for: INR, PTT      A/P: Haley Barrett is a 36 year old female with history of ADD, insomnia and lower back pain who presents for a physical examination.     Chronic left-sided low back pain without sciatica: Patient has a chronic lower back pain that is worsened with exercise. She has good relief with tylenol and Aleve but is careful to not use it to regularly. Will refer to PT and continue symptomatic treatment  -     Physical Therapy - Internal ([place]); Future  - Tylenol, NSAIDs, Ice and heat packs as needed    Insomnia, unspecified type: Patient primarily has difficulty falling asleep but is able to stay asleep through the night. Discussed risks and benefits fo ambien for long term use and patient encouraged to try other sleep aids. Offered patient referral to sleep medicine and she will evaluate how she feels over the next month. Will refill for one month and have her try melatonin and other sleep  practices.   -     zolpidem (AMBIEN) 10 MG tablet; Take 1 tablet (10 mg) by mouth nightly as needed for Insomnia.  - Melatonin OTC nightly  - Counseled about good sleep practices    Attention deficit disorder, unspecified hyperactivity presence: managed by outside psychiatrist. Symptoms well controlled with medications. Only needs to take occassionally    Routine screening for STI (sexually transmitted infection): Denies any dysuria, increased frequency or discharge. Will order screening tests  -     HIV-Antibody Rapid Test, Blood Lavender & Yellow Serum Seperator Tube; Future  -     Chlamydia/GC PCR, Urine Roche PCR Collection SystemTube; Future    Encounter for contraceptive management, unspecified type: Patient to continue OCP for  birth control as well as for management of her acne. Encouraged her to continue dermatology recommended topical treatment for her acne.     Laboratory exam ordered as  part of routine general medical examination: History of elevated cholesterol levels. Order routine screening to continue monitoring. Counseled on healthy diet and lifestyle.   -     Lipid Panel Green Plasma Separator Tube; Future    Patient is due for her PAP exam but requests a female provider. Will have her return in 1 month to reestablish care.     General Health Maintenance    Follow-up in 4 weeks to establish with new provider and for PAP exam.    Pt was seen and discussed with Dr. Penelope Galas, who is in agreement with my plan    Lauro Franklin, MD  PGY 1, Internal Medicine Resident  Pager: [phone]

## 2017-03-01 NOTE — Progress Notes (Signed)
ATTENDING NOTE    Subjective:   I discussed the case with the resident at the time of the clinical session.  I reviewed the entire history.  History of present illness (HPI):  Back pain and insomnia      Objective:   BP 114/76 (BP Location: Left arm, BP Patient Position: Sitting, BP cuff size: Regular)   Pulse 66   Temp 97.9 F (36.6 C) (Oral)   Resp 14   Ht 5\' 10"  (1.778 m)   Wt 75.8 kg (167 lb)   SpO2 99%   BMI 23.96 kg/m2     I saw and examined the patient and reviewed the resident's examination findings. The test results were reviewed with the resident.       Medical Plan of Care:   Assessment and Plan reviewed, discussed and finalized with the resident.   Chronic low back pain.  Cont Physical Therapy and cont NSAID's as needed  Insomnia.  Counseled about sleep hygiene.  And sparingly use of Ambien.    I agree with the resident's plan as documented.      ICD-10-CM ICD-9-CM    1. Chronic left-sided low back pain without sciatica M54.5 724.2 Physical Therapy - Internal ([place])    G89.29 338.29    2. Insomnia, unspecified type G47.00 780.52 zolpidem (AMBIEN) 10 MG tablet   3. Attention deficit disorder, unspecified hyperactivity presence F98.8 314.00    4. Routine screening for STI (sexually transmitted infection) Z11.3 V74.5 HIV-Antibody Rapid Test, Blood Lavender & Yellow Serum Seperator Tube      Chlamydia/GC PCR, Urine Roche PCR Collection SystemTube   5. Encounter for contraceptive management, unspecified type Z30.9 V25.9    6. Laboratory exam ordered as part of routine general medical examination Z00.00 V72.62 Lipid Panel Green Plasma Separator Tube      Co-morbid medical problem was taken into account as part of the thought process of making a medical decision regarding the active/acute issue(s).    See the resident physician's note for further details.

## 2017-03-01 NOTE — Patient Instructions (Addendum)
1. Please have labs drawn on the 4th floor prior to leaving  2. I have referred you to physical therapy. Please call to schedule an appointment.   3. When scheduling an appointment request a "new patient slot" with the provider of your choice.   4. If you would like a referral to women's medicine please call the clinic and I am happy to put in the referral.       Pain medication:  Ibuprofen 400 - 600  mg three times daily with food:    Or   Naproxen 250 - 500 mg twice daily with food       Stop Ibuprofen / Naproxen if acid reflux or stomach pain.      Tylenol/Acetaminophen around 325 - 500 or 1000 mg three times daily for pain as needed      May combine the two pain medications.    Max daily dose of Tylenol/Acetaminophen is 3000 mg per 24 hours        Clarification:  NSAID's (all types such as Motrin, Advil, Naproxen, Ibuprofen and others)      There are many simple steps you can take to help you fall asleep faster and stay asleep. Here are some tips:   1) Minimize noise with earplugs and minimize light with window blinds, heavy curtains, or an eye mask. Do not turn on bright lights if you need to get up at night. Use a small night-light instead.   2) Avoid large meals within two hours of bedtime. If you are hungry, a glass of milk or a light snack is a good choice. Milk contains the amino acid L-tryptophan, which has been shown in research to help people go to sleep.   3) Get aerobic exercise during the day to reduce stress hormones, but avoid anything too strenuous within three hours of bedtime. Regular exercise may promote deeper sleep.   4) Go to bed at a regular time and avoid napping late in the afternoon. If you need to nap, take a brief nap for 10-15 minutes about eight hours after you awake.   5) Stop working at any task an hour before bedtime to calm mental activity.   6) At bedtime, keep your mind off worries or things that upset you; avoid discussing emotional issues in bed.   7) Consider having pets  stay outside of your sleeping area. Having a pet in bed with you may cause you to wake if you have allergies or if the pet moves around on the bed.   8) Make sure your bedroom is well ventilated and a comfortable temperature (below 52F and above 60F).   9) Keep your bedroom for sex and sleeping. If you can't sleep or if you wake up in the middle of the night, go into another room and read a book or watch television until you feel sleepy.   10) Learn a relaxation technique such as progressive muscle relaxation and practice it in bed.   11) Nicotine is a stimulant and should be avoided particularly near bedtime and upon night awakenings. Stimulants may interfere with sleep.   12) Do NOT look at the clock during night time     Caffeine should be discontinued at least eight or more hours before bedtime. Caffeine is a stimulant that is present in coffee, cola, tea, chocolate, and various over-the-counter medications. Consider gradually reducing the amount of caffeine you consume to avoid withdrawal symptoms like headaches.   Adapted from https://www.west.net/   Additional  tips for difficulty sleeping:   Go to bed every night at same time.   Do wind down activities every night: reading relaxing book, magazine, light snack, warm bath.   Avoid reading and watching TV in the bedroom.   Get up at same time every day.   Make sure your bed and pillows are comfortable.   If you are still having trouble using all of these measures please set up a follow-up appointment to additional strategiese discuss.

## 2017-03-02 ENCOUNTER — Encounter (INDEPENDENT_AMBULATORY_CARE_PROVIDER_SITE_OTHER): Payer: Self-pay

## 2017-03-22 ENCOUNTER — Other Ambulatory Visit: Payer: Medicaid Other | Attending: Internal Medicine

## 2017-03-22 DIAGNOSIS — Z113 Encounter for screening for infections with a predominantly sexual mode of transmission: Principal | ICD-10-CM | POA: Insufficient documentation

## 2017-03-22 DIAGNOSIS — Z0000 Encounter for general adult medical examination without abnormal findings: Secondary | ICD-10-CM | POA: Insufficient documentation

## 2017-03-22 LAB — LIPID(CHOL FRACT) PANEL, BLOOD
Cholesterol: 211 mg/dL (ref <200)
HDL-Cholesterol: 119 mg/dL
LDL-Chol (Calc): 78 mg/dL (ref <160)
Non-HDL Cholesterol: 92 mg/dL
Triglycerides: 70 mg/dL (ref 10–170)

## 2017-03-22 LAB — HIV-ANTIBODY RAPID TEST, BLOOD: HIV 1/2 Rapid Antibody Test: NONREACTIVE

## 2017-03-22 NOTE — Interdisciplinary (Signed)
Blood drawn from left arm with 21 gauge needle. 3 tubes taken.   Patient identity authenticated by [facility].

## 2017-03-23 LAB — CHLAMYDIA/GONORRHEA PCR, URINE
Chlamydia trachomatis PCR, Urine: NOT DETECTED
Neisseria gonorrhoeae PCR, Urine: NOT DETECTED

## 2017-06-04 ENCOUNTER — Telehealth (INDEPENDENT_AMBULATORY_CARE_PROVIDER_SITE_OTHER): Payer: Self-pay | Admitting: Internal Medicine

## 2017-06-04 ENCOUNTER — Encounter (INDEPENDENT_AMBULATORY_CARE_PROVIDER_SITE_OTHER): Payer: Self-pay | Admitting: Internal Medicine

## 2017-06-04 DIAGNOSIS — M545 Low back pain: Principal | ICD-10-CM

## 2017-06-04 DIAGNOSIS — G8929 Other chronic pain: Principal | ICD-10-CM

## 2017-06-04 NOTE — Telephone Encounter (Signed)
A user error has taken place: encounter opened in error, closed for administrative reasons.

## 2017-06-04 NOTE — Telephone Encounter (Signed)
Patient called requesting referral order below for continuation of treatment. Please advise.    Referral Request    Name of PCP Provider: Leane Platt (no longer with [facility])  Insurance Coverage Verified: yes  Who is requesting the referral? Patient    What type of referral is being requested? Physical therapy  Reason for request? Patient completed previous PT order and need continuation of treatment.   Previous order: Physical Therapy - Internal ([place]) (Order # [id number]) on 03/01/2017    Has this issue been discussed with provider?   Last office visit: 03/01/2017 with Dr. Zenda Alpers  Next office visit: Visit date not found   Best way to contact patient:  [phone]   Alternative communication method:     Full name of external provider or vendor:  internal  Patient's insurance is accepted? yes    Has been advised this message will be transmitted to office and can expect a response within the next 24-72 hours.

## 2017-06-07 NOTE — Telephone Encounter (Signed)
Order signed.

## 2017-06-07 NOTE — Telephone Encounter (Signed)
Pended.

## 2017-06-07 NOTE — Telephone Encounter (Signed)
Would you be able to extend PT for pt? Or do you advise to place another referral ? Pls let me know

## 2017-06-09 ENCOUNTER — Telehealth (HOSPITAL_BASED_OUTPATIENT_CLINIC_OR_DEPARTMENT_OTHER): Payer: Self-pay

## 2017-06-09 NOTE — Telephone Encounter (Signed)
I have attempted to reach the patient and schedule a therapy appointment. I was able to leave a message and provide our main line [phone], so the patient may call us back to schedule at their earliest convenience.

## 2017-06-18 ENCOUNTER — Encounter (INDEPENDENT_AMBULATORY_CARE_PROVIDER_SITE_OTHER): Payer: Medicaid Other | Admitting: Internal Medicine

## 2017-07-09 ENCOUNTER — Ambulatory Visit (INDEPENDENT_AMBULATORY_CARE_PROVIDER_SITE_OTHER): Payer: Medicaid Other | Admitting: Rehabilitative and Restorative Service Providers"

## 2017-07-09 DIAGNOSIS — M545 Low back pain, unspecified: Secondary | ICD-10-CM

## 2017-07-09 DIAGNOSIS — G8929 Other chronic pain: Secondary | ICD-10-CM

## 2017-07-12 NOTE — Interdisciplinary (Signed)
Referring Physician Lunde, Manley Mason Viker    Diagnosis     ICD-10-CM ICD-9-CM    1. Low back pain potentially associated with radiculopathy M54.5 724.2    2. Chronic bilateral low back pain without sciatica M54.5 724.2     G89.29 338.29        Preferred Language:English                        Past Medical History:   Diagnosis Date    ADD (attention deficit disorder)     Clavicular fracture       Past Surgical History:   Procedure Laterality Date    ------------OTHER-------------  36 yo    humerus ORIF              Physical Therapy Evaluation       07/09/17 1500 07/12/17 1300    Pain Assessment    Pain Asssessment Tool Numeric Pain Rating Scale Numeric Pain Rating Scale      07/09/17 1500 07/12/17 1300    Numeric Pain Rating Scale     Pain Intensity - rating at present 1 1    Pain Intensity - rating at worst  9 9    Pain Intensity- rating after treatment 1 1    Frequency  Constant Constant    Description  Sharp;Aching;Radiating;Other (comment)   stiffness, shooting Sharp;Aching;Radiating;Other (comment)   stiffness, shooting    Location  L low back, lateral hip radiating to L thigh L low back, lateral hip radiating to L thigh    Pain Description --       07/09/17 1500 07/12/17 1300    Objective    Objective Findings Observation: hypertrophy lumbar paraspinals L>R, long torso. Alignment: fw head and shoulders, lumbar extension, increased extension moment L4-5, L anterior innominate. Lumbar Movement: (+) pain with EXT, L SB. MMT: Low Abs <1/5 with proper palpable muscle contraction. Special Tests: (+) pain with standing march test, L SLS. Observation: hypertrophy lumbar paraspinals L>R, long torso. Alignment: fw head and shoulders, lumbar extension, increased extension moment L4-5, L anterior innominate. Lumbar Movement: (+) pain with EXT, L SB. MMT: Low Abs <1/5 with proper palpable muscle contraction. Special Tests: (+) pain with standing march test, L SLS.                 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 2017 x-ray indicated mild osteoarthropathy L4-S1, minimal degeneration SI and pubic symphysis. Sx's related to mechanical low back pain, overdominance of lumbar extensors vs lower abdominals with functional movements (reaching forward, extension, SLS). Pt benefits from PT to  help her manage her sx's through proper movement and exercise. 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 2017 x-ray indicated mild osteoarthropathy L4-S1, minimal degeneration SI and pubic symphysis. Sx's related to mechanical low back pain, overdominance of lumbar extensors vs lower abdominals with functional movements (reaching forward, extension, SLS). Pt benefits from PT to help her manage her sx's through proper movement and exercise.    Rehab Potential Good Good      07/09/17 1500 07/09/17 1600    Functional Limitation Reporting     Rationale Clinical judgement     Assessment: Back Index - Current functional level impairment 20 to 39 20 to 39    Visit type Initial ongoing     Impairment Category Mobility     Mobility: Walking and Moving Around Current Status (Q6761) CJ     Mobility: Walking and Moving Around Goal Status [phone]) [facility]       07/12/17  1300       Functional Limitation Reporting     Rationale Clinical judgement     Assessment: Back Index - Current functional level impairment 20 to 39     Visit type Initial ongoing     Impairment Category Mobility     Mobility: Walking and Moving Around Current Status (O6712) CJ     Mobility: Walking and Moving Around Goal Status [phone]) [facility]       07/09/17 1500 07/12/17 1300    Patient stated Goal    Patient stated goal painfree and return to work, leisure and ADL's without deficits painfree and return to work, leisure and ADL's without deficits      07/09/17 1500 07/12/17 [place]    Treatment Plan Discussion & Agreement Patient Patient    Patient/Family Questions Yes - All questions asked & answered Yes - All questions asked & answered      07/09/17 1500 07/12/17 1300    MediCal Treatment completed today    MediCal Treatment initial 30 minutes ([id number]) Completed Completed    MediCal Treatment Add 15 min minutes (X8338) 2 2      07/09/17 1500 07/12/17 1300    MediCAL Treatment today    Manual therapy  Mobilization grade/direction;Joint mobilization;Myofascial release;Muscle energy techniques Mobilization grade/direction;Joint mobilization;Myofascial release;Muscle energy techniques    Mobilization grade/direction Other (comment)   L lat glide w/active L SB 3 reps Other (comment)   L lat glide w/active L SB 3 reps    Neuromuscular re-education  Patient education;Postural control activities in relation to gravity and surfaces Patient education;Postural control activities in relation to gravity and surfaces      Neuromuscular re-education   low ab iso, SIJ ant innominate MET low ab iso, SIJ ant innominate MET      07/09/17 1500 07/12/17 1300    Treatment Time     Total TIMED Treatment  (min) 30 30    Total Treatment Time (min) 60 60          The physical therapist of record is endorsed by evaluating physical therapist.

## 2017-07-13 ENCOUNTER — Encounter (INDEPENDENT_AMBULATORY_CARE_PROVIDER_SITE_OTHER): Payer: Self-pay | Admitting: Student in an Organized Health Care Education/Training Program

## 2017-07-13 ENCOUNTER — Other Ambulatory Visit
Payer: Medicaid Other | Attending: Internal Medicine | Admitting: Student in an Organized Health Care Education/Training Program

## 2017-07-13 VITALS — BP 97/63 | HR 65 | Temp 97.1°F | Resp 18 | Ht 70.0 in | Wt 163.0 lb

## 2017-07-13 DIAGNOSIS — Z0000 Encounter for general adult medical examination without abnormal findings: Secondary | ICD-10-CM

## 2017-07-13 DIAGNOSIS — Z789 Other specified health status: Secondary | ICD-10-CM

## 2017-07-13 DIAGNOSIS — Z124 Encounter for screening for malignant neoplasm of cervix: Principal | ICD-10-CM | POA: Insufficient documentation

## 2017-07-13 DIAGNOSIS — Z3041 Encounter for surveillance of contraceptive pills: Secondary | ICD-10-CM

## 2017-07-13 DIAGNOSIS — G8929 Other chronic pain: Secondary | ICD-10-CM

## 2017-07-13 DIAGNOSIS — M545 Low back pain: Secondary | ICD-10-CM

## 2017-07-13 DIAGNOSIS — Z01419 Encounter for gynecological examination (general) (routine) without abnormal findings: Secondary | ICD-10-CM

## 2017-07-13 DIAGNOSIS — Z1389 Encounter for screening for other disorder: Secondary | ICD-10-CM

## 2017-07-13 DIAGNOSIS — Z1339 Encounter for screening examination for other mental health and behavioral disorders: Secondary | ICD-10-CM

## 2017-07-13 DIAGNOSIS — G4700 Insomnia, unspecified: Secondary | ICD-10-CM

## 2017-07-13 MED ORDER — DROSPIRENONE-ETHINYL ESTRADIOL 3-0.02 MG OR TABS
1.0000 | ORAL_TABLET | Freq: Every day | ORAL | 3 refills | Status: DC
Start: 2017-07-13 — End: 2018-07-19

## 2017-07-13 NOTE — Progress Notes (Signed)
ATTENDING NOTE:    Chief Complaint   Patient presents with    Gyn Exam       SUBJECTIVE:  I reviewed the history, chart, and resident's note and agree with the documentation, with the following additions/changes:    History of present illness (HPI):  Haley Barrett is a 36 year old female who presents for gyn exam.  No complaints, no concerns.    STI testing few months ago was negative, no new partners.    Chronic LBP, doing PT and really helping.    OBJECTIVE: I have seen and examined the patient and I concur with the resident's exam.     MEDICAL PLAN of CARE:   Assessment and plan reviewed with the resident physician, and I agree with the resident's plan as documented.  See the resident's note for further details.    Annual exam today.

## 2017-07-13 NOTE — Patient Instructions (Addendum)
It was a pleasure seeing you today! Please contact me if you have any questions. The clinic number is [phone]. After hours contact number is [phone].    Here are a few of the topics that we discussed:    - we will do the pap smear this time! Your next one, if it is normal, will be in 5 years!  - please let me know if there is anything we can do; I will be taking over as your primary care physician since Dr. Threasa BeardsYang has graduated! I can easily be reached via MyChart for any concerns.   - if you continue to have issues with getting the Yaz covered, please give us a call; it should be covered by [name] and we could sign any paperwork as needed     Please have your labs done on the 4th floor.    ** If you are concerned about insurance coverage, costs and/or deductible, prior to doing the labs and/or tests please contact your insurance company and/or the lab. If you have high deductible or self-pay, then consider outside [place] labs like [facility] and [facility] to compare costs.    I look forward to working with you again.    Take care,  Pilar GrammesAnna Brycen Bean, MD

## 2017-07-13 NOTE — Progress Notes (Signed)
Chief Complaint   Patient presents with    Gyn Exam     SUBJECTIVE::Haley Barrett is a 36 year old female with a hx of ADD, insomnia and LBP presents for cervical cancer screening.     Her last cervical cancer screen was 4 years prior, which was normal. She noted she previously received screening yearly and has never had abnormal results. Has had two sexual partners in last four years. Has been screened for gc/chlamydia 03/2017 without abnormalities.    Denies pruritis, vaginal discharge, or vaginal discomfort. Does not have periods as she takes her birth control continuously. Feels that it controls her acne well. Has been using same birth control for the past 10 years.     ROS:    Constitutional: negative for fever, chills, weight loss or weight gain  CV: negative for chest pain, palpitations, LEE  Respiratory: negative for SOB, DOE, cough  GI: negative for nausea, vomiting, abdominal pain    Patient Active Problem List   Diagnosis    ADD (attention deficit disorder)    Birth control    Finger dislocation, subsequent encounter     Past medical history and family history reviewed and updated today as below.    Allergies and medications reviewed and updated as below.    Past Medical History:   Diagnosis Date    ADD (attention deficit disorder)     Clavicular fracture        Social History   Substance Use Topics    Smoking status: Never Smoker    Smokeless tobacco: Never Used    Alcohol use Yes      Comment: socially, couple drinks/week      Social History     Social History Narrative    Single    Psychotherapist - just beginning, in [employer]. [place]    [place] 6 [place] ago    Tour guide-food and history tours     Hobbies: beach volleyball, horse back riding, exercise      No Known Allergies    Current Outpatient Prescriptions   Medication Sig    Amphetamine-Dextroamphetamine (ADDERALL PO) as needed.    drospirenone-ethinyl estradiol (YAZ) 3-0.02  MG tablet Take 1 tablet by mouth daily.    zolpidem (AMBIEN) 10 MG tablet Take 1 tablet (10 mg) by mouth nightly as needed for Insomnia.     No current facility-administered medications for this visit.      OBJECTIVE:: BP 97/63 (BP Location: Left arm, BP Patient Position: Sitting, BP cuff size: Regular)   Pulse 65   Temp 97.1 F (36.2 C) (Oral)   Resp 18   Ht 5\' 10"  (1.778 m)   Wt 73.9 kg (163 lb)   SpO2 99%   Breastfeeding? No   BMI 23.39 kg/m2   Gen: alert, no acute distress  ENMT: OP clear without erythema or exudates  Neck: supple, no LAD  Heart:  RRR, no murmurs, rubs, or gallups  Lungs: clear to auscultation, normal respiratory effort  Abdomen: soft, non-tender.  Normoactive bowel sounds.  No masses or hepatosplenomegaly.  Genital: scant white watery discharge. Pink and healthy vaginal mucosa. No friability of the cervix.   M/S:  no edema.  Normal gait.    Labs and chart reviewed.    A/P: Ms. Haley Barrett is a 36 yr old woman w/ previous negative pap screens and negative HPV in 2014 presents for cervical cancer screening.     #  Cervical cancer screening - no history of abnormal pap smears. Last pap in 2014 normal with negative HPV. Will continue pap smears every 5 years pending negative HPV and no atypical cells.   -     Cytopath Gyn (Pap Smear)  -     Reflex to HPV     #Uses birth control - uses Yaz for past 10 years. Continuous and avoids menstrual cycles. Insurance has not been covering prescription; will likely need prior authorization as it is not on formulary, but appropriate to continue as has been patients medication for past 10 years.   -     drospirenone-ethinyl estradiol (YAZ) 3-0.02 MG tablet; Take 1 tablet by mouth daily.    #Insomnia - chronic per patient. Takes 1/4 of zolpidem prn when difficulty initiating sleep. Does not get refilled through psychiatrist.    -Will f/u dose and CURES check    Follow-up prn.  Patient verbalizes understanding and is agreeable to above plan.

## 2017-07-16 ENCOUNTER — Ambulatory Visit (INDEPENDENT_AMBULATORY_CARE_PROVIDER_SITE_OTHER): Payer: Medicaid Other | Admitting: Rehabilitative and Restorative Service Providers"

## 2017-07-16 DIAGNOSIS — G8929 Other chronic pain: Secondary | ICD-10-CM

## 2017-07-16 DIAGNOSIS — M545 Low back pain, unspecified: Secondary | ICD-10-CM

## 2017-07-16 NOTE — Interdisciplinary (Signed)
Physical Therapy Daily Follow up Note    Referring Physician: Beckey RutterLunde, Ottar Viker           Preferred Language:English    Patient to continue therapy for     ICD-10-CM ICD-9-CM    1. Low back pain potentially associated with radiculopathy M54.5 724.2    2. Chronic left-sided low back pain without sciatica M54.5 724.2     G89.29 338.29              SUBJECTIVE AND OBJECTIVE       07/16/17 1400       Subjective    Status since last treatment  Pt reports feeling much better since last PT session. Compliance with low ab iso with functional tasks and volleyball has reduced pain.       07/16/17 1400       Pain Assessment    Pain Asssessment Tool Numeric Pain Rating Scale       07/16/17 1400       Numeric Pain Rating Scale     Pain Intensity - rating at present 1     Pain Intensity - rating at worst  7     Frequency  Constant     Description  Sharp;Aching;Radiating;Other (comment)     Location  L low back, lateral hip radiating to L thigh       07/16/17 1400       Pain Characteristics    Relieving Factors - better with --   posterior pelvic tilt, flat lumbar spine       07/16/17 1400       Objective Findings    Objective Findings Stiff and short lats. L shoulder hypomobility due to old clavicular fracture. EO iso with cuing. Cues for PPT.                                           ASSESSMENT AND PLAN       07/16/17 1400       Assessment     Assessment  Pt is gaining better understanding of PT DX (lumbar extension) that increases sx's. SIJ good with HEP.     Rehab Potential Good       07/16/17 1400       Outpatient Treatment Plan    OP Treatment Frequency 1 time per week     OP Treatment Duration 11 weeks       07/16/17 1400       Focus for Next Treatment    Focus for Next Treatment --   assess L sh contributing factors to L SIJ pn       07/16/17 1400       Treatment Plan Discussion    Treatment Plan Discussion & Agreement Patient     Patient/Family Questions Yes - All questions asked & answered       07/16/17  1400       Treatment provided today    Payor  MediCal       07/16/17 1400       MediCal Treatment completed today    MediCal Treatment initial 30 minutes (x3908) Completed       07/16/17 1400       MediCAL Treatment today    Neuromuscular re-education  Patient education;Postural control activities in relation to gravity and surfaces       Neuromuscular re-education  EO iso w/PPT supine H-L & wall, Active lat str (L sh pain)     Therapeutic exercise   Patient education;Spinal stabilization exercises;Flexibility exercises;Lumbar stabilization exercises       Therapeutic exercise   HEP review and progression       07/16/17 1400       Treatment Time     Total TIMED Treatment  (min) 30     Total Treatment Time (min) 30

## 2017-07-17 LAB — HPV HIGH RISK DNA PROBE, FEMALE
HPV High Risk Genotype 16: NOT DETECTED
HPV High Risk Genotype 18: NOT DETECTED
HPV Other High Risk Genotypes (Not type 16 or 18): NOT DETECTED

## 2017-07-19 ENCOUNTER — Telehealth (INDEPENDENT_AMBULATORY_CARE_PROVIDER_SITE_OTHER): Payer: Self-pay | Admitting: Student in an Organized Health Care Education/Training Program

## 2017-07-19 NOTE — Telephone Encounter (Signed)
Please let Haley Barrett know that she was HPV negative and had a normal pap smear - she will not need a repeat until 2023.

## 2017-07-23 ENCOUNTER — Ambulatory Visit (INDEPENDENT_AMBULATORY_CARE_PROVIDER_SITE_OTHER): Payer: Medicaid Other | Admitting: Rehabilitative and Restorative Service Providers"

## 2017-07-23 DIAGNOSIS — M545 Low back pain, unspecified: Secondary | ICD-10-CM

## 2017-07-23 DIAGNOSIS — G8929 Other chronic pain: Principal | ICD-10-CM

## 2017-07-23 NOTE — Interdisciplinary (Signed)
Physical Therapy Daily Follow up Note    Referring Physician: Beckey RutterLunde, Ottar Viker           Preferred Language:English    Patient to continue therapy for     ICD-10-CM ICD-9-CM    1. Chronic left-sided low back pain without sciatica M54.5 724.2     G89.29 338.29    2. Low back pain potentially associated with radiculopathy M54.5 724.2    3. Chronic bilateral low back pain without sciatica M54.5 724.2     G89.29 338.29    4. Chronic midline low back pain without sciatica M54.5 724.2     G89.29 338.29              SUBJECTIVE AND OBJECTIVE       07/23/17 1600       Subjective    Status since last treatment  "I feel better than I have in a while." Continues low ab isometric with ADL's. H/O L shoulder instability, pain and weakness. Pain to low back with standing.       07/23/17 1600       Pain Assessment    Pain Asssessment Tool Numeric Pain Rating Scale       07/23/17 1600       Numeric Pain Rating Scale     Pain Intensity - rating at present 4     Location  L low back, L shoulder, radiating sx's to lateral thigh       07/23/17 1600       Objective Findings    Objective Findings L humeral excessive inferior glide with OH reaching, ABD/FLEX, pain! Scapular winging with return > initiation of overhead movement. Tight to upper arm. MFR to scap-thoracic muscles.                                           ASSESSMENT AND PLAN       07/23/17 1600       Assessment     Assessment  L shoulder instability and disuse affects myofascial plains related to L SIJ pain. Need to treat shoulder to affect L SIJ pain and radiculopathy.     Rehab Potential Good       07/23/17 1600       Outpatient Treatment Plan    OP Treatment Frequency 1 time per week     OP Treatment Duration 4 weeks       07/23/17 1600       Focus for Next Treatment    Focus for Next Treatment --   Address scap winging, gentle progression of movement L sh       07/23/17 1600       Treatment provided today    Payor  Medicare/Private       07/23/17 1600       Therapeutic Procedures    Manual therapy (97140)  Myofascial release   scap-thoracic L        Total TIMED Treatment (min)  5     Neuromuscular re-education (30865(97112)  Patient education;Postural control activities in relation to gravity and surgaces;Trunk alighment/stability activities        Total TIMED Treatment (min)  20     Therapeutic exercise  (97110)  Closed kinetic chain training;Core stabilization exercises;Home Exercise Program (HEP) demonstration and performance;Isometric exercises;Patient education;Spinal stabilization exercises        Total TIMED Treatment (min)  5

## 2017-07-28 ENCOUNTER — Ambulatory Visit (INDEPENDENT_AMBULATORY_CARE_PROVIDER_SITE_OTHER): Payer: Medicaid Other | Admitting: Rehabilitative and Restorative Service Providers"

## 2017-07-28 DIAGNOSIS — M545 Low back pain, unspecified: Secondary | ICD-10-CM

## 2017-07-28 DIAGNOSIS — G8929 Other chronic pain: Principal | ICD-10-CM

## 2017-07-28 NOTE — Interdisciplinary (Signed)
Physical Therapy Daily Follow up Note    Referring Physician: Beckey RutterLunde, Ottar Viker           Preferred Language:English    Patient to continue therapy for     ICD-10-CM ICD-9-CM    1. Chronic left-sided low back pain without sciatica M54.5 724.2     G89.29 338.29    2. Low back pain potentially associated with radiculopathy M54.5 724.2    3. Chronic bilateral low back pain without sciatica M54.5 724.2     G89.29 338.29    4. Chronic midline low back pain without sciatica M54.5 724.2     G89.29 338.29              SUBJECTIVE AND OBJECTIVE       07/28/17 1100       Subjective    Status since last treatment  20' late but accommodated by PT. Pt reports feeling better since she started PT. Able to work out without pain.       07/28/17 1100       Pain Assessment    Pain Asssessment Tool Numeric Pain Rating Scale       07/28/17 1100       Numeric Pain Rating Scale     Pain Intensity - rating at present 3     Location  L SIJ, shoulder, hip                                           ASSESSMENT AND PLAN       07/28/17 1100       Assessment     Assessment  MFR restrictions to L HF and parascapular muscles is limiting functional movement. L trunk and hip weakness noted.     Rehab Potential Good       07/28/17 1100       Patient/Family Education    Learner(s) Patient     Patient/family training in appropriate therapeutic interventions Teaching reinforced;Verbalized understanding;Needs reinforcement     Education Topic(s) Exercise Program;Activity restrictions and precautions       07/28/17 1100       Treatment provided today    Payor  MediCal       07/28/17 1100       MediCal Treatment completed today    MediCal Treatment initial 30 minutes (x3908) Completed       07/28/17 1100       MediCAL Treatment today    Manual therapy  Myofascial release       Manual therapy  L HF, Parascapular     Neuromuscular re-education  Patient education;Isolated movement/techniques to gain selective motor control;Postural control  activities in relation to gravity and surfaces;Trunk alignment/stability activities       Neuromuscular re-education   R SKTC w/L HS, Prone Hip Rot, Scapular Depress/Diagonal down + back     Therapeutic exercise   Core stabilization exercises;Patient education;Spinal stabilization exercises       Therapeutic exercise   HEP progression       07/28/17 1100       Treatment Time     Total TIMED Treatment  (min) 30     Total Treatment Time (min) 30

## 2017-08-02 ENCOUNTER — Telehealth (INDEPENDENT_AMBULATORY_CARE_PROVIDER_SITE_OTHER): Payer: Self-pay | Admitting: Student in an Organized Health Care Education/Training Program

## 2017-08-02 DIAGNOSIS — M545 Low back pain: Principal | ICD-10-CM

## 2017-08-02 DIAGNOSIS — G8929 Other chronic pain: Principal | ICD-10-CM

## 2017-08-02 NOTE — Telephone Encounter (Signed)
Referral Request    Name of PCP Provider: Cyndia Diverstashchanka, Anna pcp  Insurance Coverage Verified: Verified- Active and in network  Who is requesting the referral? [facility] [place] call center   What type of referral is being requested? Physical Therapy  Reason for request? Continuing therapy   Has this issue been discussed with provider? yes    Last office visit: 07/28/2017   Next office visit: N/A  Best way to contact patient: [phone] home   Alternative communication method:  work    Has been advised this message will be transmitted to office and can expect a response within the next 24-72 hours.

## 2017-08-02 NOTE — Telephone Encounter (Signed)
Incorrect routing of pool.This is a Resident patient.

## 2017-08-04 NOTE — Telephone Encounter (Signed)
Pended.

## 2017-08-04 NOTE — Telephone Encounter (Signed)
signed

## 2017-08-13 ENCOUNTER — Ambulatory Visit (INDEPENDENT_AMBULATORY_CARE_PROVIDER_SITE_OTHER): Payer: Medicaid Other | Admitting: Rehabilitative and Restorative Service Providers"

## 2017-08-13 DIAGNOSIS — M545 Low back pain, unspecified: Secondary | ICD-10-CM

## 2017-08-13 DIAGNOSIS — G8929 Other chronic pain: Secondary | ICD-10-CM

## 2017-08-13 NOTE — Interdisciplinary (Signed)
Physical Therapy Daily Follow up Note    Referring Physician: Tera Mater           Preferred [place]    Patient to continue therapy for     ICD-10-CM ICD-9-CM    1. Low back pain potentially associated with radiculopathy M54.5 724.2    2. Chronic left-sided low back pain without sciatica M54.5 724.2     G89.29 338.29    3. Chronic bilateral low back pain without sciatica M54.5 724.2     G89.29 338.29    4. Chronic midline low back pain without sciatica M54.5 724.2     G89.29 338.29              SUBJECTIVE AND OBJECTIVE       08/13/17 1400       Patient stated Goal    Patient stated goal painfree and return to work, leisure and ADL's without deficits       08/13/17 1400       Subjective    Status since last treatment  Pt reports feeling increased pain to back after ambulating down back drop at beach then playing volleyball. Pt non-compliant with HEP. Unable to describe pain well.       08/13/17 1400       Pain Assessment    Pain Asssessment Tool Numeric Pain Rating Scale       08/13/17 1400       Numeric Pain Rating Scale     Pain Intensity - rating at present 6     Pain Intensity - rating at worst  7     Frequency  Constant     Description  Sharp;Aching;Radiating;Other (comment)     Location  L SIJ with radiating sx's to anterolateral hip, shoulder, hip, mid-thoracic sx's.       08/13/17 1400       Pain Characteristics    Exacerbating Factors Time of day - worse in the morning     Exacerbating Factors Activity - worse while   standing;twisting;walking on uneven surfaces;walking on level surfaces;driving;changing positions;weight bearing;sitting     Exacerbating Factors Position - worse with   extension       08/13/17 1400       Objective Findings    Objective Findings Restrictions noted to L HF, parascapular muscles, thoracic lower paraspinals. Reviewed HEP and positioning to alleviate sx's.                                           ASSESSMENT AND PLAN       08/13/17 1400       Assessment     Assessment  Pt's lack of improvement due to continuing movements that aggravate sx's without compliance with HEP. Pt requires constant reminders to maintain spinal neutral.  Weak and long scapular adductors causes poor posture. Did not progress HEP due to non-compliance.       08/13/17 1400       Outpatient Treatment Plan    OP Treatment Frequency 1 time per week     OP Treatment Duration 7 weeks       08/13/17 1400       Focus for Next Treatment    Focus for Next Treatment --   Review HEP, progress low abs, scap stab ER, prone scap pinch       08/13/17 1400  Treatment Plan Discussion    Treatment Plan Discussion & Agreement Patient     Patient/Family Questions Yes - All questions asked & answered       08/13/17 1400       Patient/Family Education    Learner(s) Patient     Patient/family training in appropriate therapeutic interventions Ongoing;Teaching reinforced;Needs reinforcement       08/13/17 1400       GOAL 1 (Short Term)    Impairment Education     Education To eliminate or reduce symptoms, patient able to return demonstrate the use of correct body mechanics and posture independently when performing     Custom goal Pt will be Ind in HEP.      No. visits 3-5     Goal status Continue       08/13/17 1400       GOAL 2 (Short Term)     Impairment  Activity tolerance     Activity tolerance Patient able to stand without return of pain or symptoms for     Custom goal to wash dishes, perform work duties     No. visits 5-7     Goal status Partially Met       08/13/17 1400       GOAL 3 (Short Term)     Impairment  Activity tolerance     Custom goal Pt will be able to sit/drive for 60' to perform work duties without deficits or pain     No. visits 5-7     Goal status Partially Met       08/13/17 1400       GOAL 4 (Short Term)     Impairment  Functional Mobility     Custom goal Pt will demo proper squat in flat lumbar spine to aide with sit-stand, digging volleyball with proper mechanics and no pain       No. visits 7-10     Goal status Continue       08/13/17 1400       Goal  (Long Term)     Functional deficit summary Self-Manage Sx's     Goal (Long Term)  Pt will be able to manage sx's independently through HEP     No. visits 10-14     Goal status Continue       08/13/17 1400       Treatment provided today    Payor  MediCal       08/13/17 1400       MediCal Treatment completed today    MediCal Treatment initial 30 minutes (x3908) Completed     MediCal Treatment Add 15 min minutes (B1517) 2       08/13/17 1400       MediCAL Treatment today    Manual therapy  Myofascial release       Manual therapy  L HF, parascapular muscles, low t/s paraspinals     Neuromuscular re-education  Patient education;Isolated movement/techniques to gain selective motor control;Postural control activities in relation to gravity and surfaces;Trunk alignment/stability activities       Neuromuscular re-education   R SKTC w/L HS, Prone scap stab/w/ER, Prone Hip Rot, Scapular Depress/Diagonal down + back     Therapeutic exercise   Core stabilization exercises;Patient education;Spinal stabilization exercises       Therapeutic exercise   HEP review     Electrical stimulation, unattended   Interferential   L SIJ       08/13/17 1400  Treatment Time     Total TIMED Treatment  (min) 60     Total Treatment Time (min) 70

## 2017-08-19 ENCOUNTER — Telehealth (INDEPENDENT_AMBULATORY_CARE_PROVIDER_SITE_OTHER): Payer: Self-pay

## 2017-08-19 ENCOUNTER — Ambulatory Visit (INDEPENDENT_AMBULATORY_CARE_PROVIDER_SITE_OTHER): Payer: Medicaid Other | Admitting: Rehabilitative and Restorative Service Providers"

## 2017-08-19 NOTE — Telephone Encounter (Signed)
cxl appt for PT Therapy for today 12/13 (appt at 10:30 a.m.) per pt's VM left on 12/12 @ 9:15 p.m. Informed therapist.

## 2017-08-27 ENCOUNTER — Ambulatory Visit (INDEPENDENT_AMBULATORY_CARE_PROVIDER_SITE_OTHER): Payer: Medicaid Other | Admitting: Rehabilitative and Restorative Service Providers"

## 2017-08-27 DIAGNOSIS — G8929 Other chronic pain: Principal | ICD-10-CM

## 2017-08-27 DIAGNOSIS — M545 Low back pain, unspecified: Secondary | ICD-10-CM

## 2017-08-27 NOTE — Interdisciplinary (Signed)
Physical Therapy Daily Follow up Note    Referring Physician: Beckey RutterLunde, Ottar Viker           Preferred Language:English    Patient to continue therapy for     ICD-10-CM ICD-9-CM    1. Chronic left-sided low back pain without sciatica M54.5 724.2     G89.29 338.29    2. Low back pain potentially associated with radiculopathy M54.5 724.2    3. Chronic bilateral low back pain without sciatica M54.5 724.2     G89.29 338.29    4. Chronic midline low back pain without sciatica M54.5 724.2     G89.29 338.29        SUBJECTIVE AND OBJECTIVE     Row Name 08/27/17 1400          Patient stated Goal    Patient stated goal  painfree and return to work, leisure and ADL's without deficits     Row Name 08/27/17 1400          Subjective    Status since last treatment   Pt reports semi-compliance with HEP. Pt has taken a jog with decreased pain. Pt was ok with volleyball.     Row Name 08/27/17 1400          Pain Assessment    Pain Asssessment Tool  Numeric Pain Rating Scale     Row Name 08/27/17 1400          Numeric Pain Rating Scale     Pain Intensity - rating at present  6     Pain Intensity - rating at worst   8     Frequency   Constant     Description   Sharp;Aching;Radiating;Other (comment)     Location   L back feels locked up     Row Name 08/27/17 1400          Pain Characteristics    Exacerbating Factors Time of day - worse  in the morning     Exacerbating Factors Activity - worse while    standing;twisting;walking on uneven surfaces;walking on level surfaces;driving;changing positions;weight bearing;sitting     Exacerbating Factors Position - worse with    extension     Row Name 08/27/17 1400          Objective Findings    Objective Findings  Pelvic rotation with BKFO.                                     ASSESSMENT AND PLAN     Row Name 08/27/17 1400          Assessment     Assessment   Cuing to correct low ab stabilization with exercises. Pt improving lumbopelvic stabilization.     Row Name 08/27/17 1400           Outpatient Treatment Plan    OP Treatment Frequency  1 time per week     OP Treatment Duration  6 weeks     Row Name 08/27/17 1400          Focus for Next Treatment    Focus for Next Treatment  -- review HEP, posture at wall     Row Name 08/27/17 1400          Treatment Plan Discussion    Treatment Plan Discussion & Agreement  Patient     Patient/Family Questions  Yes - All questions asked &  answered     Row Name 08/27/17 1400          Patient/Family Education    Learner(s)  Patient     Patient/family training in appropriate therapeutic interventions  Teaching reinforced;Needs reinforcement;Able to return demonstrate teaching;Verbalized understanding     Row Name 08/27/17 1400          Treatment provided today    Payor   MediCal     Row Name 08/27/17 1400          Therapeutic Procedures    Neuromuscular re-education [phone](97112)   --       Neuromuscular re-education    --     Row Name 08/27/17 1400          MediCal Treatment completed today    MediCal Treatment initial 30 minutes (x3908)  Completed     MediCal Treatment Add 15 min minutes (U0454(x3910)  1     Row Name 08/27/17 1400          MediCAL Treatment today    Neuromuscular re-education   Isolated movement/techniques to gain selective motor control;Postural control activities in relation to gravity and surfaces;Trunk alignment/stability activities;Patient education       Neuromuscular re-education    low abs BKFO's, HS, prone low abs hip rot, prone scap pinch, prone scap stab, posture re-ed     Therapeutic exercise    Core stabilization exercises;Patient education;Spinal stabilization exercises       Therapeutic exercise    HEP, POC reiterated!!!     Row Name 08/27/17 1400          Treatment Time     Total TIMED Treatment  (min)  45     Total Treatment Time (min)  50

## 2017-09-10 ENCOUNTER — Ambulatory Visit (INDEPENDENT_AMBULATORY_CARE_PROVIDER_SITE_OTHER): Payer: Medicaid Other | Admitting: Rehabilitative and Restorative Service Providers"

## 2017-09-10 DIAGNOSIS — M545 Low back pain, unspecified: Secondary | ICD-10-CM

## 2017-09-10 DIAGNOSIS — G8929 Other chronic pain: Principal | ICD-10-CM

## 2017-09-10 NOTE — Interdisciplinary (Signed)
Physical Therapy Daily Follow up Note    Referring Physician: Beckey Rutter           Preferred Language:English    Patient to continue therapy for     ICD-10-CM ICD-9-CM    1. Chronic left-sided low back pain without sciatica M54.5 724.2     G89.29 338.29    2. Chronic bilateral low back pain without sciatica M54.5 724.2     G89.29 338.29    3. Chronic midline low back pain without sciatica M54.5 724.2     G89.29 338.29    4. Low back pain potentially associated with radiculopathy M54.5 724.2        SUBJECTIVE AND OBJECTIVE     Row Name 09/10/17 1400          Patient stated Goal    Patient stated goal  painfree and return to work, leisure and ADL's without deficits     Row Name 09/10/17 1400          Subjective    Status since last treatment   Compliant with HEP. Pt feels pretty good this date.      Row Name 09/10/17 1400          Pain Assessment    Pain Asssessment Tool  Numeric Pain Rating Scale     Row Name 09/10/17 1400          Numeric Pain Rating Scale     Pain Intensity - rating at present  3     Pain Intensity - rating at worst   8     Frequency   Constant     Description   Sharp;Aching;Radiating;Other (comment)     Location   L back feels locked up     Row Name 09/10/17 1400          Pain Characteristics    Exacerbating Factors Time of day - worse  in the morning     Exacerbating Factors Activity - worse while    standing;twisting;walking on uneven surfaces;walking on level surfaces;driving;changing positions;weight bearing;sitting     Exacerbating Factors Position - worse with    extension     Exacerbating Factors Other (comments)  Aggravating Factors: standing, upon waking in A.M., hip flexor compensation, digging in volleyball     Relieving Factors - better with  ice     Impact on function - interferes with  self care activities;mobility;work duties;leisure activities     Row Name 09/10/17 1400          Objective Findings    Objective Findings  Able to keep level pelvis with supine  low abs. Better recruitment of scapular stabilizers.                                     ASSESSMENT AND PLAN     Row Name 09/10/17 1400          Assessment     Assessment   Pt is demo better understanding of lumbar neutral with flat spine with lower abdominal recuitment. Pt benefits from strict strengthening in lumbar neutral in varying positions. Encourage scapular mobility and L shoulder ROM gently to decrease locking up of L SIJ.     Rehab Potential  Good     Row Name 09/10/17 1400          Outpatient Treatment Plan    OP Treatment Frequency  1 time per  week     OP Treatment Duration  5 weeks     Row Name 09/10/17 1400          Focus for Next Treatment    Focus for Next Treatment  -- review scap stab/MT L1/W, bridge roll, towel wipes <>v^     Row Name 09/10/17 1400          Treatment Plan Discussion    Treatment Plan Discussion & Agreement  Patient     Patient/Family Questions  Yes - All questions asked & answered     Row Name 09/10/17 1400          Patient/Family Education    Learner(s)  Patient     Patient/family training in appropriate therapeutic interventions  Ongoing;Teaching reinforced;Able to return demonstrate teaching;Verbalized understanding;Written Home exercise program/instructions provided     Row Name 09/10/17 1400          Treatment provided today    Payor   MediCal     Row Name 09/10/17 1400          MediCal Treatment completed today    MediCal Treatment initial 30 minutes [phone]([id number])  Completed     Row Name 09/10/17 1400          MediCAL Treatment today    Manual therapy   Myofascial release       Manual therapy   L scapulothoracic     Neuromuscular re-education   --       Neuromuscular re-education    --     Therapeutic exercise    Core stabilization exercises;Isometric exercises;Lumbar stabilization exercises;Patient education;Spinal stabilization exercises       Therapeutic exercise    low abs BKFO's, HS, prone scap pinch/W/MT L1, prone scap stab w+w/o ER, posture re-ed, HEP     Row Name 09/10/17  1400          Treatment Time     Total TIMED Treatment  (min)  30     Total Treatment Time (min)  30

## 2017-09-17 ENCOUNTER — Ambulatory Visit (INDEPENDENT_AMBULATORY_CARE_PROVIDER_SITE_OTHER): Payer: Medicaid Other | Admitting: Rehabilitative and Restorative Service Providers"

## 2017-09-17 DIAGNOSIS — M545 Low back pain, unspecified: Secondary | ICD-10-CM

## 2017-09-17 DIAGNOSIS — G8929 Other chronic pain: Principal | ICD-10-CM

## 2017-09-20 NOTE — Interdisciplinary (Signed)
Physical Therapy Daily Follow up Note    Referring Physician: Tera Mater           Preferred [place]    Patient to continue therapy for     ICD-10-CM ICD-9-CM    1. Chronic left-sided low back pain without sciatica M54.5 724.2     G89.29 338.29    2. Chronic bilateral low back pain without sciatica M54.5 724.2     G89.29 338.29    3. Chronic midline low back pain without sciatica M54.5 724.2     G89.29 338.29    4. Low back pain potentially associated with radiculopathy M54.5 724.2        [place] Name 09/20/17 0800          Patient stated Goal    Patient stated goal  painfree and return to work, leisure and ADL's without deficits     Row Name 09/20/17 0800          Subjective    Status since last treatment   Pt reports she has been feeling increased clicking and pain to L side of low back. Has been prepping for apt move. Lots of bending over and lifting. Pt also sick and has been doing HEP.     Row Name 09/20/17 0800          Pain Assessment    Pain Asssessment Tool  Numeric Pain Rating Scale     Row Name 09/20/17 0800          Numeric Pain Rating Scale     Pain Intensity - rating at present  3     Pain Intensity - rating at worst   8     Frequency   Constant     Description   Sharp;Aching;Radiating;Other (comment)     Location   L back feels locked up     [place] Name 09/20/17 0800          Pain Characteristics    Exacerbating Factors Time of day - worse  in the morning     Exacerbating Factors Activity - worse while    standing;twisting;walking on uneven surfaces;walking on level surfaces;driving;changing positions;weight bearing;sitting     Exacerbating Factors Position - worse with    extension     Row Name 09/20/17 0800          Objective Findings    Objective Findings  L anterior innominate which decreased with MET and L Leg pull. Painful and hypomobile with L L4-5 PA's. STM to [place] Name  09/20/17 0800          Assessment     Assessment   Pt presents with guarding to L low back. Unable to address shoulder hypomobility due to pain complaints. Better alignment with treatment. Good neutral spine in flat position. Pt still benefits from mobility to L shoulder to decrease L guarding.      Rehab Potential  Good     Row Name 09/20/17 0800          Outpatient Treatment Plan    OP Treatment Frequency  1 time per week     OP Treatment Duration  4 weeks     Row  Name 09/20/17 0800          Focus for Next Treatment    Focus for Next Treatment  -- scap stab, MT L1-2/W, Towel wipes     Row Name 09/20/17 0800          Treatment Plan Discussion    Treatment Plan Discussion & Agreement  Patient     Patient/Family Questions  Yes - All questions asked & answered     [place] Name 09/20/17 0800          Treatment provided today    [place] Name 09/20/17 0800          MediCal Treatment completed today    MediCal Treatment initial 30 minutes ([id number])  Completed     Row Name 09/20/17 0800          MediCAL Treatment today    Manual therapy   Myofascial release;Joint mobilization       Manual therapy   L SIJ ant MET, Leg pull, MFR L QL     Therapeutic exercise    Core stabilization exercises;Isometric exercises;Lumbar stabilization exercises;Patient education;Spinal stabilization exercises;Strengthening exercises       Therapeutic exercise    hip hikes, hip rot, bridge roll up and down, low abs BKFO's, HS, prone scap pinch/W/MT L1, prone scap stab w+w/o ER, posture re-ed, HEP     Row Name 09/20/17 0800          Treatment Time     Total TIMED Treatment  (min)  30     Total Treatment Time (min)  30

## 2017-09-21 ENCOUNTER — Ambulatory Visit (INDEPENDENT_AMBULATORY_CARE_PROVIDER_SITE_OTHER): Payer: Medicaid Other | Admitting: Rehabilitative and Restorative Service Providers"

## 2017-09-21 DIAGNOSIS — M545 Low back pain, unspecified: Secondary | ICD-10-CM

## 2017-09-21 DIAGNOSIS — G8929 Other chronic pain: Secondary | ICD-10-CM

## 2017-09-21 NOTE — Interdisciplinary (Signed)
Physical Therapy Daily Follow up Note    Referring Physician: Beckey RutterLunde, Ottar Viker           Preferred Language:English    Patient to continue therapy for     ICD-10-CM ICD-9-CM    1. Chronic left-sided low back pain without sciatica M54.5 724.2     G89.29 338.29    2. Chronic bilateral low back pain without sciatica M54.5 724.2     G89.29 338.29    3. Chronic midline low back pain without sciatica M54.5 724.2     G89.29 338.29    4. Low back pain potentially associated with radiculopathy M54.5 724.2        SUBJECTIVE AND OBJECTIVE     Row Name 09/21/17 1300          Patient stated Goal    Patient stated goal  painfree and return to work, leisure and ADL's without deficits     Row Name 09/21/17 1300          Subjective    Status since last treatment   Pt reports tailbone pain but not as much to L low back and hip. Pt does not know if she can attribute it to moving boxes. "I feel more and more how when I move my left arm up, I feel more pain to my back."     Row Name 09/21/17 1300          Pain Assessment    Pain Asssessment Tool  Numeric Pain Rating Scale     Row Name 09/21/17 1300          Numeric Pain Rating Scale     Pain Intensity - rating at present  6     Pain Intensity - rating at worst   8     Frequency   Constant     Description   Sharp;Aching;Radiating;Other (comment)     Location   tailbone     Row Name 09/21/17 1300          Objective Findings    Objective Findings  Progressed ROM with WB through L UE. POsture corrections. SIJ aligned. Slight hypomobility to L4-S1 with PA's L. POstural cues.                                     ASSESSMENT AND PLAN     Row Name 09/21/17 1300          Assessment     Assessment   Pt is progressing with postural correction in standing. thoracolumbar fascia L affected by self-restricted ROM to L shoulder. Pt's SIJ was aligned and pt not as painful this date. Continue to progress ROM to L shoulder with WB exercises. Pt benefits from core in lumbar neutral to flat  position.     Row Name 09/21/17 1300          Outpatient Treatment Plan    OP Treatment Frequency  1 time per week     OP Treatment Duration  3 weeks     Row Name 09/21/17 1300          Focus for Next Treatment    Focus for Next Treatment  -- Abd Wall wipes, UE Lavon PaganiniWallslides     Row Name 09/21/17 1300          Treatment Plan Discussion    Treatment Plan Discussion & Agreement  Patient  Patient/Family Questions  Yes - All questions asked & answered     Row Name 09/21/17 1300          Patient/Family Education    Learner(s)  Patient     Patient/family training in appropriate therapeutic interventions  Ongoing;Teaching reinforced;Able to return demonstrate teaching;Verbalized understanding;Written Home exercise program/instructions provided     Row Name 09/21/17 1300          Treatment provided today    Payor   MediCal     Row Name 09/21/17 1300          MediCal Treatment completed today    MediCal Treatment initial 30 minutes [phone])  Completed     Row Name 09/21/17 1300          MediCAL Treatment today    Manual therapy   Myofascial release;Joint mobilization       Manual therapy   L PA L4-S1, MFR L QL     Therapeutic exercise    Core stabilization exercises;Isometric exercises;Lumbar stabilization exercises;Patient education;Spinal stabilization exercises;Strengthening exercises;Assisted exercises with tactile cues/facilitation to increase muscle recruitment;Range of motion exercises       Therapeutic exercise    hip hikes, prone MT L1-2, prone scap stab w+w/o ER, No $, Towel wipes: table, wall, posture re-ed, HEP     Row Name 09/21/17 1300          Treatment Time     Total TIMED Treatment  (min)  30     Total Treatment Time (min)  30

## 2017-10-01 ENCOUNTER — Ambulatory Visit (INDEPENDENT_AMBULATORY_CARE_PROVIDER_SITE_OTHER): Payer: Medicaid Other | Admitting: Rehabilitative and Restorative Service Providers"

## 2017-10-01 DIAGNOSIS — M545 Low back pain, unspecified: Secondary | ICD-10-CM

## 2017-10-01 DIAGNOSIS — G8929 Other chronic pain: Principal | ICD-10-CM

## 2017-10-01 NOTE — Interdisciplinary (Signed)
Physical Therapy Progress Assessment Note    Referring Physician: Lunde, Ottar Viker           Preferred Language:English    Patient to continue therapy for     ICD-10-CM ICD-9-CM    1. Chronic left-sided low back pain without sciatica M54.5 724.2     G89.29 338.29    2. Chronic bilateral low back pain without sciatica M54.5 724.2     G89.29 338.29    3. Chronic midline low back pain without sciatica M54.5 724.2     G89.29 338.29    4. Low back pain potentially associated with radiculopathy M54.5 724.2        SUBJECTIVE AND OBJECTIVE     Row Name 10/01/17 1400          Patient stated Goal    Patient stated goal  painfree and return to work, leisure and ADL's without deficits     Row Name 10/01/17 1400          Subjective    Status since last treatment   70% Functional Improvement with PT treatment, as pt reports the following: decreased pain, able to sleep better, managing sx's better, getting out of bed. Functional Deficits: volleyball, standing. Pt feels as though she would benefit from more PT treatment to help her progress to full independence with HEP and sx management. Pt is afraid that when she returns to normal PA level, her pain will return. Pt played volleyball and had residual R glut med soreness but usual L sided LBP, SIJ and back pain was reduced. L shoulder movement increases and reproduces sx's to L SIJ. Pt asks that PT address this issue. Overall, pt notes she feels better since starting PT.     Row Name 10/01/17 1400          Pain Assessment    Pain Asssessment Tool  Numeric Pain Rating Scale     Row Name 10/01/17 1400          Numeric Pain Rating Scale     Pain Intensity - rating at present  3     Pain Intensity - rating at worst   9     Frequency   Constant     Description   Sharp;Aching;Radiating;Other (comment)     Location   L SIJ     Row Name 10/01/17 1400          Pain Characteristics    Exacerbating Factors Time of day - worse  in the morning     Exacerbating Factors  Activity - worse while    standing;twisting;walking on uneven surfaces;walking on level surfaces;driving;changing positions;weight bearing;sitting     Exacerbating Factors Position - worse with    extension     Exacerbating Factors Other (comments)  Aggravating Factors: standing, upon waking in A.M., hip flexor compensation, digging in volleyball     Relieving Factors - better with  ice     Row Name 10/01/17 1400          Objective Findings    Objective Findings  alignment: L SIJ discrepancy but able to fix this with SIJ MET. Lumbar Movement Tests: (+) pain reproduction with Extension > return from forward flexion, Sidebending L, Rotation L. Special Tests: (+) pain with Standing March Test (previously, painful with SLS). SLS Trendelenburg B. Overhead reaching: L SIJ pain reproduced, painful and movement impairments into excessive humeral inferior glide noted. Pt self limits to shoulder height reaching L due to instability and pain.             PT Tool Box     Row Name 10/01/17 1400          Modified Oswestry Disability Questionnaire    Back Index Total Score  40     Assessment: Back Index - Current functional level impairment  40 to 59                                 ASSESSMENT AND PLAN     Row Name 10/01/17 1400          Assessment     Assessment   Ms. Beier has made functional gains with PT treatment, as she demonstrates better ability to attain neutral lumbar spine in flat posture to reduce sx's, is more compliant with HEP and has a better understanding of how to manage sx's. However, pt presents with deficits of strength and lumbar neutral with movement in standing CKC loading and normal physical activity level. Pt also presents with L shoulder hypomobility due to h/o inferior GHJ instability, subsequent guarding and disuse to L UE, and L thoracolumbar myofascial restrictions that reproduce L SIJ with overhead reaching. As a result, pt's rehab would be more effective if, L shoulder was addressed because of its  affect on L SIJ pain. Pt remains a strong candidate for skilled PT treatment for a few more sessions to progress strength, L shoulder mobility, body mechanics and wean to HEP.      Rehab Potential  Good     Row Name 10/01/17 1400          Outpatient Treatment Plan    OP Treatment Frequency  1 time per week     OP Treatment Duration  2 weeks     Row Name 10/01/17 1400          Focus for Next Treatment    Focus for Next Treatment  -- ABD wall wipes, UE Wallslides, Doggy     Row Name 10/01/17 1400          Treatment Plan Discussion    Treatment Plan Discussion & Agreement  Patient     Patient/Family Questions  Yes - All questions asked & answered     Row Name 10/01/17 1400          Patient/Family Education    Learner(s)  Patient     Patient/family training in appropriate therapeutic interventions  Ongoing;Teaching reinforced;Able to return demonstrate teaching;Verbalized understanding;Needs reinforcement;Written Home exercise program/instructions provided     Row Name 10/01/17 1400          Functional Limitation Reporting     Assessment: Back Index - Current functional level impairment  40 to 59     Row Name 10/01/17 1400          Treatment provided today    Payor   MediCal     Row Name 10/01/17 1400          MediCAL Treatment today    Therapeutic exercise    Core stabilization exercises;Isometric exercises;Lumbar stabilization exercises;Patient education;Spinal stabilization exercises;Strengthening exercises;Assisted exercises with tactile cues/facilitation to increase muscle recruitment;Range of motion exercises;Closed kinetic chain training       Therapeutic exercise    HEP, hip hikes w/flat spine, SIJ MET, Low ab HS, POC/PN     Row Name 10/01/17 1400          Treatment Time     Total TIMED Treatment  (min)  30       Total Treatment Time (min)  30

## 2017-10-04 ENCOUNTER — Encounter (INDEPENDENT_AMBULATORY_CARE_PROVIDER_SITE_OTHER): Payer: Self-pay | Admitting: Rehabilitative and Restorative Service Providers"

## 2017-10-04 DIAGNOSIS — M7918 Myalgia, other site: Secondary | ICD-10-CM

## 2017-10-04 DIAGNOSIS — M545 Low back pain: Principal | ICD-10-CM

## 2017-10-04 DIAGNOSIS — M25612 Stiffness of left shoulder, not elsewhere classified: Secondary | ICD-10-CM

## 2017-10-04 DIAGNOSIS — G8929 Other chronic pain: Principal | ICD-10-CM

## 2017-10-04 DIAGNOSIS — M5416 Radiculopathy, lumbar region: Secondary | ICD-10-CM

## 2017-10-08 ENCOUNTER — Ambulatory Visit (INDEPENDENT_AMBULATORY_CARE_PROVIDER_SITE_OTHER): Payer: Medicaid Other | Admitting: Rehabilitative and Restorative Service Providers"

## 2017-10-08 ENCOUNTER — Encounter (INDEPENDENT_AMBULATORY_CARE_PROVIDER_SITE_OTHER): Payer: Self-pay | Admitting: Rehabilitative and Restorative Service Providers"

## 2017-10-08 DIAGNOSIS — M7918 Myalgia, other site: Secondary | ICD-10-CM

## 2017-10-08 DIAGNOSIS — M5416 Radiculopathy, lumbar region: Principal | ICD-10-CM

## 2017-10-08 DIAGNOSIS — M25612 Stiffness of left shoulder, not elsewhere classified: Secondary | ICD-10-CM

## 2017-10-08 NOTE — Interdisciplinary (Signed)
Physical Therapy Daily Follow up Note    Referring Physician: Beckey Rutter           Preferred Language:English    Patient to continue therapy for     ICD-10-CM ICD-9-CM    1. Lumbar radiculopathy M54.16 724.4    2. Shoulder stiffness, left M25.612 719.51    3. Myofascial pain on left side M79.18 729.1        SUBJECTIVE AND OBJECTIVE     Row Name 10/08/17 1300          Patient stated Goal    Patient stated goal  painfree and return to work, leisure and ADL's without deficits     Row Name 10/08/17 1300          Subjective    Status since last treatment   When pt looks down and moves shoulders, she has pain to L hip. SIJ has not been painful. Pt feels L shoulder is locking up back and causing pain.     Row Name 10/08/17 1300          Pain Assessment    Pain Asssessment Tool  Numeric Pain Rating Scale     Row Name 10/08/17 1300          Numeric Pain Rating Scale     Pain Intensity - rating at present  3     Pain Intensity - rating at worst   9     Frequency   Constant     Description   Aching;Other (comment) stiffness     Location   L SIJ < hip, shoulder stiffness     Row Name 10/08/17 1300          Pain Characteristics    Exacerbating Factors Time of day - worse  in the morning     Exacerbating Factors Activity - worse while    standing;twisting;walking on uneven surfaces;walking on level surfaces;driving;changing positions;weight bearing;sitting     Exacerbating Factors Position - worse with    extension     Relieving Factors - better with  ice     Row Name 10/08/17 1300          Objective Findings    Objective Findings  L SIJ anterior. Level SIJ with L leg pull. Restrictions noted to thoracolumbar myofascial planes, L scapulothoracic area, subscapularis. Improved scapular mobility that was painfree after manual therapy.                                     ASSESSMENT AND PLAN     Row Name 10/08/17 1300          Assessment     Assessment   Pt presents with myofascial restrictions to L SIJ as a  result of disuse of L shoulder with overhead mobility. Pt gained more active assistive mobility to shoulder after treatment.     Rehab Potential  Good     Row Name 10/08/17 1300          Outpatient Treatment Plan    OP Treatment Frequency  1 time per week     OP Treatment Duration  1 week     Row Name 10/08/17 1300          Focus for Next Treatment    Focus for Next Treatment  -- Rev HEP, [employer], Scap [place]     Row Name 10/08/17 1300  Treatment Plan Discussion    Treatment Plan Discussion & Agreement  Patient     Patient/Family Questions  Yes - All questions asked & answered     Row Name 10/08/17 1300          Patient/Family Education    Learner(s)  Patient     Patient/family training in appropriate therapeutic interventions  Ongoing;Teaching reinforced;Able to return demonstrate teaching;Verbalized understanding;Needs reinforcement;Written Home exercise program/instructions provided     Written Home exercise program/instructions provided  -- View at "[email or web address]" using code: [email or web address]     Row Name 10/08/17 1300          Treatment provided today    Payor   MediCal     Row Name 10/08/17 1300          MediCal Treatment completed today    MediCal Treatment initial 30 minutes [phone])  Completed     Row Name 10/08/17 1300          MediCAL Treatment today    Manual therapy   Myofascial release;Patient education       Manual therapy   MFR: T-L, subscap, parascap     Therapeutic exercise    Isometric exercises;Patient education;Lumbar stabilization exercises;Range of motion exercises;Assisted exercises with tactile cues/facilitation to increase muscle recruitment       Therapeutic exercise    HEP, S-L Scap x3, ER 90 abd, UE wallslide with forearms     Hot/Cold Packs  Ice pack application     Row Name 10/08/17 1300          Treatment Time     Total TIMED Treatment  (min)  30     Total Treatment Time (min)  40

## 2017-10-15 ENCOUNTER — Ambulatory Visit (INDEPENDENT_AMBULATORY_CARE_PROVIDER_SITE_OTHER): Payer: Medicaid Other | Admitting: Rehabilitative and Restorative Service Providers"

## 2017-10-15 DIAGNOSIS — M25612 Stiffness of left shoulder, not elsewhere classified: Secondary | ICD-10-CM

## 2017-10-15 DIAGNOSIS — M5416 Radiculopathy, lumbar region: Secondary | ICD-10-CM

## 2017-10-15 DIAGNOSIS — M7918 Myalgia, other site: Secondary | ICD-10-CM

## 2017-10-15 NOTE — Interdisciplinary (Signed)
Physical Therapy Daily Follow up Note    Referring Physician: Beckey RutterLunde, Ottar Viker           Preferred Language:English    Patient to continue therapy for     ICD-10-CM ICD-9-CM    1. Lumbar radiculopathy M54.16 724.4    2. Shoulder stiffness, left M25.612 719.51    3. Myofascial pain on left side M79.18 729.1        SUBJECTIVE AND OBJECTIVE     Row Name 10/15/17 1300          Patient stated Goal    Patient stated goal  painfree and return to work, leisure and ADL's without deficits     Row Name 10/15/17 1300          Subjective    Status since last treatment   L abdominal pain that feels like muscle. Non-compliant with HEP. Does sometimes has pain to pelvic floor.     Row Name 10/15/17 1300          Pain Assessment    Pain Asssessment Tool  Numeric Pain Rating Scale     Row Name 10/15/17 1300          Numeric Pain Rating Scale     Pain Intensity - rating at present  3     Pain Intensity - rating at worst   9     Frequency   Constant     Description   Aching     Location   L SIJ < hip, shoulder stiffness, L hip flexor > R.     Row Name 10/15/17 1300          Pain Characteristics    Exacerbating Factors Time of day - worse  in the morning     Exacerbating Factors Activity - worse while    standing;twisting;walking on uneven surfaces;walking on level surfaces;driving;changing positions;weight bearing;sitting     Exacerbating Factors Position - worse with    extension     Relieving Factors - better with  ice     Row Name 10/15/17 1300          Objective Findings    Objective Findings  MFR to psoas, iliacus, L subscap, thoracolumbar. Able to inhibit lumbar paraspinals with hip extensor training.                                     ASSESSMENT AND PLAN     Row Name 10/15/17 1300          Assessment     Assessment   Pt demo hip flexor spasming and overuse that is causing anterior shear forces at lumbar spine and bringing her into excessive extension which causes increased pain.     Rehab Potential  Good      Row Name 10/15/17 1300          Outpatient Treatment Plan    OP Treatment Frequency  1 time per week     OP Treatment Duration  7 weeks     Row Name 10/15/17 1300          Focus for Next Treatment    Focus for Next Treatment  -- rev HEP, quad rock back, scap wallslide     Row Name 10/15/17 1300          Treatment Plan Discussion    Treatment Plan Discussion & Agreement  Patient  Patient/Family Questions  Yes - All questions asked & answered     Row Name 10/15/17 1300          Patient/Family Education    Learner(s)  Patient     Patient/family training in appropriate therapeutic interventions  Ongoing;Teaching reinforced;Able to return demonstrate teaching;Verbalized understanding;Needs reinforcement;Written Home exercise program/instructions provided     Patient/family training comments  View at "[email or web address]" using code: [id number]     Row Name 10/15/17 1300          Treatment provided today    Payor   MediCal     Row Name 10/15/17 1300          MediCal Treatment completed today    MediCal Treatment initial 30 minutes [phone])  Completed     Row Name 10/15/17 1300          MediCAL Treatment today    Manual therapy   Myofascial release;Patient education       Manual therapy   B psoas, iliacus, subscap, T-L fascia     Therapeutic exercise    Open kinetic chain training;Patient education;Strengthening exercises       Therapeutic exercise    Prone and over table hip ext, glut max. Pt Ed/POC/importance of HEP.     Hot/Cold Packs  Ice pack application     Row Name 10/15/17 1300          Treatment Time     Total TIMED Treatment  (min)  30     Total Treatment Time (min)  40

## 2017-10-27 ENCOUNTER — Ambulatory Visit (INDEPENDENT_AMBULATORY_CARE_PROVIDER_SITE_OTHER): Payer: Medicaid Other | Admitting: Rehabilitative and Restorative Service Providers"

## 2017-10-27 DIAGNOSIS — M5416 Radiculopathy, lumbar region: Secondary | ICD-10-CM

## 2017-10-27 DIAGNOSIS — M25612 Stiffness of left shoulder, not elsewhere classified: Secondary | ICD-10-CM

## 2017-10-27 DIAGNOSIS — M7918 Myalgia, other site: Secondary | ICD-10-CM

## 2017-10-28 NOTE — Interdisciplinary (Signed)
Physical Therapy Daily Follow up Note    Referring Physician: Tera Mater           Preferred [place]    Patient to continue therapy for     ICD-10-CM ICD-9-CM    1. Lumbar radiculopathy M54.16 724.4    2. Shoulder stiffness, left M25.612 719.51    3. Myofascial pain on left side M79.18 729.1        SUBJECTIVE AND OBJECTIVE     Row Name 10/27/17 0900          Patient stated Goal    Patient stated goal  painfree and return to work, leisure and ADL's without deficits     Row Name 10/27/17 0900          Subjective    Status since last treatment   Did a back bend in yoga and then felt L LBP with radiating sx's to lateral thigh. Attempted old and current HEP to help alleviate sx's. Feels like she needs help with this.     [place] Name 10/27/17 0900          Pain Assessment    Pain Asssessment Tool  Numeric Pain Rating Scale     Row Name 10/27/17 0900          Numeric Pain Rating Scale     Pain Intensity - rating at present  2     Pain Intensity - rating at worst   9     Frequency   Constant     Description   Aching     Location   L SIJ radiating along L4 lateral thigh, shoulder stiffness     Row Name 10/27/17 0900          Pain Characteristics    Exacerbating Factors Time of day - worse  in the morning     Exacerbating Factors Activity - worse while    standing;twisting;walking on uneven surfaces;walking on level surfaces;driving;changing positions;weight bearing;sitting     Exacerbating Factors Position - worse with    extension     Relieving Factors - better with  ice     Row Name 10/27/17 0900          Objective Findings    Objective Findings  L SIJ anterior. Decreased with self-MET. L shoulder ER @ 90 abd is limited. Shoulder abduction stretching to anterior shoulder, slightly decreased ROM. Facilitated supraspinatus and with ER carefully.                                     ASSESSMENT AND PLAN     Row Name 10/27/17 0900          Assessment     Assessment   Despite pt describing an  increase in her sx's with yoga pose into lumbar extension. Pt was able to manage her sx's relatively well. However, she was performing incorrect SIJ MET which is why pain did not decrease. Pt is appropriate for PT to address L shoulder hypomobility which is affecting L low back pain myofascially, HEP for pt's self-management of sx's. Pt will be weaned from PT as she is able to manage sx's independently.     Rehab Potential  Good     Row Name 10/27/17 0900          Outpatient  Treatment Plan    OP Treatment Frequency  1 time per week     OP Treatment Duration  6 weeks pt will be weaned from [place] Name 10/27/17 0900          Focus for Next Treatment    Focus for Next Treatment  -- Quad rock back w/LT, scap wallslides     Row Name 10/27/17 0900          Treatment Plan Discussion    Treatment Plan Discussion & Agreement  Patient     Patient/Family Questions  Yes - All questions asked & answered     [place] Name 10/27/17 0900          Patient/Family Education    Learner(s)  Patient     Patient/family training in appropriate therapeutic interventions  Ongoing;Teaching reinforced;Able to return demonstrate teaching;Verbalized understanding;Written Home exercise program/instructions provided     [place] Name 10/27/17 0900          Treatment provided today    [place] Name 10/27/17 0900          MediCal Treatment completed today    MediCal Treatment initial 30 minutes [phone])  Completed     Row Name 10/27/17 0900          MediCAL Treatment today    Therapeutic exercise    Assisted exercises with tactile cues/facilitation to increase muscle recruitment;Isometric exercises;Patient education       Therapeutic exercise    L SIJ MET supine/sit/stand, Low Abs HS, HEP/pain management review, Sh ER at 90 abd at wall, Supraspinatus facilitation sitting with ER.     [place] Name 10/27/17 0900          Treatment Time     Total TIMED Treatment  (min)  30     Total Treatment Time (min)  30

## 2017-11-01 ENCOUNTER — Telehealth (INDEPENDENT_AMBULATORY_CARE_PROVIDER_SITE_OTHER): Payer: Self-pay | Admitting: Student in an Organized Health Care Education/Training Program

## 2017-11-01 NOTE — Telephone Encounter (Signed)
PA request for Haley Barrett  has been placed in queue for processing. Please allow up to 72 business hours for initiation. If this is an urgent request due to immediate therapy please re-route as high priority.     Thank you so much!     [facility]

## 2017-11-01 NOTE — Telephone Encounter (Signed)
Prior auth requested for Gianvi 3-0.02MG   TABS

## 2017-11-04 NOTE — Telephone Encounter (Signed)
PAR Submitted for GIANVI on 11/04/17  via CMM Key # [id number]    Pt paid for feb ocp pak.     Insurance Plan MOLINA  BIN [id number]  PCN ADV  Group HNET  RX ID [id number]     Diagnosis Uses birth control (Z30.9)  Justification: Plan wants 3/0.03mg  (generic yasmin) - pt has been stable on current dose for over 1 year.     Will monitor status,   M. Moses MannersMegan Brymer, CPhT  ([facility])  Phone: [phone]  Ext: [phone]

## 2017-11-05 NOTE — Telephone Encounter (Signed)
PAR APPROVED for LORYNA OCP        Valid until 11/05/2027  Pa # [id number]     Pharmacy has been notified and will contact patient when ready to be picked up.     No further action needed, closing encounter.     Thank You!  M. Moses MannersMegan Brymer, CPhT  ([facility])  Phone: [phone]  Ext: [phone]

## 2017-11-10 ENCOUNTER — Telehealth: Payer: Self-pay

## 2017-11-10 ENCOUNTER — Ambulatory Visit (INDEPENDENT_AMBULATORY_CARE_PROVIDER_SITE_OTHER): Payer: Medicaid Other | Admitting: Rehabilitative and Restorative Service Providers"

## 2017-11-10 DIAGNOSIS — M5416 Radiculopathy, lumbar region: Principal | ICD-10-CM

## 2017-11-10 DIAGNOSIS — M7918 Myalgia, other site: Secondary | ICD-10-CM

## 2017-11-10 DIAGNOSIS — M25612 Stiffness of left shoulder, not elsewhere classified: Secondary | ICD-10-CM

## 2017-11-10 NOTE — Telephone Encounter (Signed)
Patient is calling in and would like to know if referral date an be extended to complete the rest of her authorized visits. Please advise.

## 2017-11-10 NOTE — Interdisciplinary (Signed)
Physical Therapy Daily Follow up Note    Referring Physician: Beckey Rutter           Preferred Language:English    Patient to continue therapy for     ICD-10-CM ICD-9-CM    1. Lumbar radiculopathy M54.16 724.4    2. Shoulder stiffness, left M25.612 719.51    3. Myofascial pain on left side M79.18 729.1        SUBJECTIVE AND OBJECTIVE     Row Name 11/10/17 1500          Patient stated Goal    Patient stated goal  painfree and return to work, leisure and ADL's without deficits     Row Name 11/10/17 1500          Subjective    Status since last treatment   pt reports compliance with HEP. L shoulder is much better and has no L SIJ pain. L lateral hip pain is persistent this time.     Row Name 11/10/17 1500          Pain Assessment    Pain Asssessment Tool  Numeric Pain Rating Scale     Row Name 11/10/17 1500          Numeric Pain Rating Scale     Pain Intensity - rating at present  2     Pain Intensity - rating at worst   9     Frequency   Constant     Description   Aching     Location   L lateral hip     Row Name 11/10/17 1500          Pain Characteristics    Exacerbating Factors Time of day - worse  in the morning     Exacerbating Factors Activity - worse while    standing;twisting;walking on uneven surfaces;walking on level surfaces;driving;changing positions;weight bearing;sitting     Exacerbating Factors Position - worse with    extension     Relieving Factors - better with  ice     Row Name 11/10/17 1500          Objective Findings    Objective Findings  L glut med weak and painful.                                     ASSESSMENT AND PLAN     Row Name 11/10/17 1500          Assessment     Assessment   Pt has been managing sx's well with HEP. L shoulder is moving better and allowing less compression to L SIJ. However, L glut med inhibition is now more apparent. Pt benefits from PT to address this deficit and continue to progress pt to HEP independently by weaning from PT.     Rehab Potential   Good     Row Name 11/10/17 1500          Outpatient Treatment Plan    OP Treatment Frequency  -- 1x in 2 wks     OP Treatment Duration  4 weeks wean from PT     Row Name 11/10/17 1500          Focus for Next Treatment    Focus for Next Treatment  -- Scap Wallslides w/lift-off, SLS rot, plank w/clams     Row Name 11/10/17 1500  Treatment Plan Discussion    Treatment Plan Discussion & Agreement  Patient     Patient/Family Questions  Yes - All questions asked & answered     Row Name 11/10/17 1500          Patient/Family Education    Learner(s)  Patient     Patient/family training in appropriate therapeutic interventions  Ongoing;Teaching reinforced     Row Name 11/10/17 1500          Treatment provided today    Payor   MediCal     Row Name 11/10/17 1500          Therapeutic Procedures      Therapeutic exercise    --     Row Name 11/10/17 1500          MediCal Treatment completed today    MediCal Treatment initial 30 minutes (x3908)  Completed     MediCal Treatment Add 15 min minutes ([id number](x3910)  1     Row Name 11/10/17 1500          MediCAL Treatment today    Manual therapy   Myofascial release;Deep tissue mobilization       Manual therapy   L glut med, facilitation x2, k-tape abdomen     Therapeutic exercise    Assisted exercises with tactile cues/facilitation to increase muscle recruitment;Isometric exercises;Range of motion exercises;Patient education;Strengthening exercises       Therapeutic exercise    supra w/ER, Towel slides at wall: flex, abd. hip hike, PPT w/hip hike     Row Name 11/10/17 1500          Treatment Time     Total TIMED Treatment  (min)  45     Total Treatment Time (min)  45

## 2017-11-12 ENCOUNTER — Telehealth (INDEPENDENT_AMBULATORY_CARE_PROVIDER_SITE_OTHER): Payer: Self-pay | Admitting: Internal Medicine

## 2017-11-12 ENCOUNTER — Encounter (INDEPENDENT_AMBULATORY_CARE_PROVIDER_SITE_OTHER): Payer: Self-pay | Admitting: Rehabilitative and Restorative Service Providers"

## 2017-11-12 DIAGNOSIS — M25612 Stiffness of left shoulder, not elsewhere classified: Secondary | ICD-10-CM

## 2017-11-12 DIAGNOSIS — M5416 Radiculopathy, lumbar region: Principal | ICD-10-CM

## 2017-11-12 NOTE — Telephone Encounter (Signed)
Nurse will tee up new Physical Therapy order

## 2017-11-12 NOTE — Telephone Encounter (Signed)
-----   Message from Reather ConverseAudrine Alanna Felix Yu, PT sent at 11/12/2017  4:59 PM PST -----  Regarding: pended order  Dr. Elenora GammaLunde,    [facility]eg[facility] order expired and I pended a new one for just 2 more visits. She is doing great and just needs a little bit of a hand hold to get her on her way.    Some MD's have been having trouble viewing and signing our PT orders in Epic. It think it is a glitch that is in the process of getting resolved. If this is the case with [facility], could you please create one?    Thanks for your help!  Wendi SnipesAudrine

## 2017-12-02 ENCOUNTER — Encounter (INDEPENDENT_AMBULATORY_CARE_PROVIDER_SITE_OTHER): Payer: Self-pay | Admitting: Rehabilitative and Restorative Service Providers"

## 2017-12-05 ENCOUNTER — Encounter (INDEPENDENT_AMBULATORY_CARE_PROVIDER_SITE_OTHER): Payer: Self-pay | Admitting: Student in an Organized Health Care Education/Training Program

## 2017-12-05 DIAGNOSIS — M545 Low back pain, unspecified: Secondary | ICD-10-CM

## 2017-12-05 DIAGNOSIS — G8929 Other chronic pain: Principal | ICD-10-CM

## 2017-12-07 ENCOUNTER — Telehealth (HOSPITAL_BASED_OUTPATIENT_CLINIC_OR_DEPARTMENT_OTHER): Payer: Self-pay

## 2017-12-07 NOTE — Telephone Encounter (Signed)
I have attempted to reach the patient and schedule a therapy appointment. I was able to leave a message and provide our main line [phone], so the patient may call us back to schedule at their earliest convenience.

## 2017-12-07 NOTE — Telephone Encounter (Signed)
From: Jackelyn HoehnMegan Vaquerano  To: Pilar GrammesAstashchanka, Cailie Bosshart, MD  Sent: 12/05/2017 2:17 PM PDT  Subject: 3-Referral Request Status    Hi there,    I guess they tried to give me a few more PT sessions, but didn't extend the date. I was waiting to hear on the status.     Thanks!  [name]

## 2017-12-10 ENCOUNTER — Ambulatory Visit (INDEPENDENT_AMBULATORY_CARE_PROVIDER_SITE_OTHER): Payer: Medicaid Other | Admitting: Rehabilitative and Restorative Service Providers"

## 2017-12-10 DIAGNOSIS — M545 Low back pain, unspecified: Secondary | ICD-10-CM

## 2017-12-10 DIAGNOSIS — R2689 Other abnormalities of gait and mobility: Secondary | ICD-10-CM

## 2017-12-10 DIAGNOSIS — M25552 Pain in left hip: Principal | ICD-10-CM

## 2017-12-10 DIAGNOSIS — G8929 Other chronic pain: Secondary | ICD-10-CM

## 2017-12-10 DIAGNOSIS — M25612 Stiffness of left shoulder, not elsewhere classified: Secondary | ICD-10-CM

## 2017-12-10 NOTE — Interdisciplinary (Signed)
Physical Therapy Progress Assessment Note    Referring Physician: Jennings Books           Preferred [place]    Patient to continue therapy for     ICD-10-CM ICD-9-CM    1. Left hip pain M25.552 719.45    2. Chronic bilateral low back pain without sciatica M54.5 724.2     G89.29 338.29    3. Balance problem R26.89 781.99    4. Shoulder stiffness, left M25.612 719.51        SUBJECTIVE AND OBJECTIVE     Row Name 12/10/17 1300          Patient stated Goal    Patient stated goal  painfree and return to work, leisure and ADL's without deficits     Row Name 12/10/17 1300          Subjective    Status since last treatment   Sacral sx's with looking down at neck in morning. this is primary concern this date. Able to use L shoulder more. L hip pain. R ankle injury is limiting standing     Row Name 12/10/17 1300          Pain Assessment    Pain Asssessment Tool  Numeric Pain Rating Scale     Row Name 12/10/17 1300          Numeric Pain Rating Scale     Pain Intensity - rating at present  3     Pain Intensity - rating at worst   9     Frequency   Constant     Description   Aching     Location   sacral pain, L posterolateral hip.     [place] Name 12/10/17 1300          Pain Characteristics    Exacerbating Factors Time of day - worse  in the morning     Exacerbating Factors Activity - worse while    standing;twisting;walking on uneven surfaces;walking on level surfaces;driving;changing positions;weight bearing;sitting     Exacerbating Factors Position - worse with    extension     Relieving Factors - better with  ice     Row Name 12/10/17 1300 12/10/17 1400       Objective Findings    Objective Findings  (L)(+) hip impingement. Glut Med 4+/5 B. SLS L glut med weakness functionally. Slump Sit (+) for reproduction of sx's to sacrum. Myofascial restrictions noted to posterior trunk.  --                                    ASSESSMENT AND PLAN     Row Name 12/10/17 1300          Assessment     Assessment    Pt presents with signs and sx's mimicking hip impingement L. Functional glut med weakness may also be driving these sx's. Overall, pt is managing pain better. However, she continues to require guidance from PT to address functional deficits. These include: body mechanics with reaching, balance issues, trunk stability, myofascial concerns. PT unable to rule out dural component to pain with positive slump sit test (previously, negative). Pt is a strong candidate for skilled PT.     Rehab Potential  Good     Row Name 12/10/17 1300          Outpatient Treatment Plan    OP Treatment Frequency  1  time per week     OP Treatment Duration  4 weeks     Row Name 12/10/17 1300          Focus for Next Treatment    Focus for Next Treatment  -- ankle driver, L doggy, scap wallslides w/lift, plank clam     Row Name 12/10/17 1300          Treatment Plan Discussion    Treatment Plan Discussion & Agreement  Patient     Patient/Family Questions  Yes - All questions asked & answered     [place] Name 12/10/17 1300          Patient/Family Education    Learner(s)  Patient     Patient/family training in appropriate therapeutic interventions  Ongoing;Needs reinforcement;Written Home exercise program/instructions provided     Patient/family training comments  hip hikes, slump sit with neck movements, ankle driver XXX     [place] Name 12/10/17 1300          Functional Limitation Reporting     Rationale  Clinical judgement     Assessment: Back Index - Current functional level impairment  20 to 39     Visit type  Progress report     Impairment Category  Mobility     Row Name 12/10/17 1300          GOAL 1 (Short Term)    Impairment  Education     Education  To eliminate or reduce symptoms, patient able to return demonstrate the use of correct body mechanics and posture independently when performing with reaching, lifting, exercise and sports     No. visits  3-5     Goal status  Continue;Partially Met     Row Name 12/10/17 1300          GOAL 2 (Short  Term)     Impairment   Activity tolerance     Activity tolerance  Patient able to stand without return of pain or symptoms for     Custom goal  to wash dishes, perform work duties     No. visits  5-7     Goal status  Partially Met;Continue     Row Name 12/10/17 1300          GOAL 3 (Short Term)     Impairment   Activity tolerance     Custom goal  Pt will be able to sit/drive for 60' to perform work duties without deficits or pain     No. visits  5-7     Goal status  Partially Met;Continue     Row Name 12/10/17 1300          GOAL 4 (Short Term)     Impairment   Functional Mobility     Custom goal  Pt will demo proper squat in flat lumbar spine to aide with sit-stand, digging volleyball with proper mechanics and no pain     No. visits  7-10     Goal status  Partially Met;Continue     Row Name 12/10/17 1300          Goal  (Long Term)     Functional deficit summary  Self-Manage Sx's     Goal (Long Term)   Pt will be able to manage sx's independently through HEP     No. visits  10-14     Goal status  Partially Met;Continue     Row Name 12/10/17 1300  Progress report date    Progress report date  12/10/17     Row Name 12/10/17 1300          Episode Information    Type of Progress Report   Update of medical/functional status     Has there been a change in Diagnosis  Yes     Has there been a change in support system   No     Row Name 12/10/17 1300          Outpatient Progress report    Patient attendance  Good, patient has attended at least 75% of scheduled appointments     Patient compliance with therapy program  Good     Symptom progression   Improved     Response to therapy  Good     Row Name 12/10/17 1400          MediCal Treatment completed today    MediCal Treatment initial 30 minutes (x3908)  Completed     Row Name 12/10/17 1400          MediCAL Treatment today    Manual therapy   Patient education       Manual therapy   L glut med fac     Neuromuscular re-education   Isolated movement/techniques to gain  selective motor control;Postural control activities in relation to gravity and surfaces;Trunk alignment/stability activities;Patient education       Neuromuscular re-education    hip hike L stance, ankle driver x3 XXX, HEP     Row Name 12/10/17 1300          Treatment Time     Total TIMED Treatment  (min)  30     Total Treatment Time (min)  30

## 2017-12-13 ENCOUNTER — Encounter (INDEPENDENT_AMBULATORY_CARE_PROVIDER_SITE_OTHER): Payer: Self-pay | Admitting: Rehabilitative and Restorative Service Providers"

## 2017-12-13 ENCOUNTER — Encounter (INDEPENDENT_AMBULATORY_CARE_PROVIDER_SITE_OTHER): Payer: Self-pay | Admitting: Internal Medicine

## 2017-12-13 DIAGNOSIS — M25612 Stiffness of left shoulder, not elsewhere classified: Secondary | ICD-10-CM

## 2017-12-13 DIAGNOSIS — M25552 Pain in left hip: Secondary | ICD-10-CM

## 2017-12-13 DIAGNOSIS — R2689 Other abnormalities of gait and mobility: Secondary | ICD-10-CM

## 2017-12-14 ENCOUNTER — Telehealth (HOSPITAL_BASED_OUTPATIENT_CLINIC_OR_DEPARTMENT_OTHER): Payer: Self-pay

## 2017-12-14 NOTE — Telephone Encounter (Signed)
I have attempted to reach the patient and schedule a therapy appointment. I was able to leave a message and provide our main line [phone], so the patient may call us back to schedule at their earliest convenience.       Thank you,  Rehabilitation Services

## 2017-12-24 ENCOUNTER — Ambulatory Visit (INDEPENDENT_AMBULATORY_CARE_PROVIDER_SITE_OTHER): Payer: Medicaid Other | Admitting: Rehabilitative and Restorative Service Providers"

## 2017-12-24 DIAGNOSIS — G8929 Other chronic pain: Principal | ICD-10-CM

## 2017-12-24 DIAGNOSIS — M545 Low back pain: Principal | ICD-10-CM

## 2017-12-24 DIAGNOSIS — R2689 Other abnormalities of gait and mobility: Secondary | ICD-10-CM

## 2017-12-24 DIAGNOSIS — M25612 Stiffness of left shoulder, not elsewhere classified: Secondary | ICD-10-CM

## 2017-12-24 DIAGNOSIS — M7918 Myalgia, other site: Secondary | ICD-10-CM

## 2017-12-24 DIAGNOSIS — M5416 Radiculopathy, lumbar region: Secondary | ICD-10-CM

## 2017-12-24 DIAGNOSIS — M25552 Pain in left hip: Secondary | ICD-10-CM

## 2017-12-24 NOTE — Interdisciplinary (Signed)
Physical Therapy Daily Follow up Note    Referring Physician: Lysle PearlJenks, Jeffrey Daniel           Preferred Language:English    Patient to continue therapy for     ICD-10-CM ICD-9-CM    1. Chronic bilateral low back pain without sciatica M54.5 724.2     G89.29 338.29    2. Left hip pain M25.552 719.45    3. Balance problem R26.89 781.99    4. Shoulder stiffness, left M25.612 719.51    5. Lumbar radiculopathy M54.16 724.4    6. Myofascial pain on left side M79.18 729.1        SUBJECTIVE AND OBJECTIVE     Row Name 12/24/17 1300          Patient stated Goal    Patient stated goal  painfree and return to work, leisure and ADL's without deficits     Row Name 12/24/17 1300          Subjective    Status since last treatment   Pt wore SIJ belt and wrapped ankle while playing volleyball. L shoulder has been "popping out" when she performs L S-L to sit transfer in bed upon waking.     Row Name 12/24/17 1300          Pain Assessment    Pain Asssessment Tool  Numeric Pain Rating Scale     Row Name 12/24/17 1300          Numeric Pain Rating Scale     Pain Intensity - rating at present  2     Pain Intensity - rating at worst   9     Frequency   Constant     Description   Aching     Location   low back pain, L posterolateral hip.     Row Name 12/24/17 1300          Pain Characteristics    Exacerbating Factors Time of day - worse  in the morning     Exacerbating Factors Activity - worse while    standing;twisting;walking on uneven surfaces;walking on level surfaces;driving;changing positions;weight bearing;sitting     Exacerbating Factors Position - worse with    extension     Exacerbating Factors Other (comments)  Aggravating Factors: standing, upon waking in A.M., hip flexor compensation, digging in volleyball     Relieving Factors - better with  ice     Impact on function - interferes with  self care activities;mobility;work duties;leisure activities     Row Name 12/24/17 1300          Objective Findings     Objective Findings  Lumbar Movement: Extension, L rotation and SB. Pain decreased with mobilizations. Focused on core stabilization. UE weighbearing exercises within pt's ability.                                     ASSESSMENT AND PLAN     Row Name 12/24/17 1300          Assessment     Assessment   Pt had decreased pain with lumbar movements after mobilizations. Benefits from core stabilization and L shoulder/trunk stability to aide with bed transfers and shoulder instability.     Rehab Potential  Good     Row Name 12/24/17 1300          Outpatient Treatment Plan    OP Treatment Frequency  1 time per week     OP Treatment Duration  3 weeks     Row Name 12/24/17 1300          Focus for Next Treatment    Focus for Next Treatment  -- 1/2 side plank, ER w/trunk rot     Row Name 12/24/17 1300          Treatment Plan Discussion    Treatment Plan Discussion & Agreement  Patient     Patient/Family Questions  Yes - All questions asked & answered     Row Name 12/24/17 1300          Patient/Family Education    Learner(s)  Patient     Patient/family training in appropriate therapeutic interventions  Ongoing;Written Home exercise program/instructions provided     Patient/family training comments  View at "[email or web address]" using code: [facility]     Row Name 12/24/17 1300          MediCal Treatment completed today    MediCal Treatment initial 30 minutes [phone])  Completed     Row Name 12/24/17 1300          MediCAL Treatment today    Manual therapy   Joint mobilization       Manual therapy   R S-L Lower lumbar rotation mobs     Neuromuscular re-education   Isolated movement/techniques to gain selective motor control;Postural control activities in relation to gravity and surfaces;Trunk alignment/stability activities;Patient education       Neuromuscular re-education    low abs HS, BKFO     Therapeutic exercise    Assisted exercises with tactile cues/facilitation to increase muscle recruitment;Closed kinetic chain  training;Patient education;Strengthening exercises       Therapeutic exercise    UE Wallslides, Wall Push-Ups, Quad Rot, HEP     Row Name 12/24/17 1300          Treatment Time     Total TIMED Treatment  (min)  30     Total Treatment Time (min)  30

## 2018-01-03 ENCOUNTER — Ambulatory Visit (INDEPENDENT_AMBULATORY_CARE_PROVIDER_SITE_OTHER): Payer: Medicaid Other | Admitting: Rehabilitative and Restorative Service Providers"

## 2018-01-03 DIAGNOSIS — M5416 Radiculopathy, lumbar region: Secondary | ICD-10-CM

## 2018-01-03 DIAGNOSIS — M25552 Pain in left hip: Secondary | ICD-10-CM

## 2018-01-03 DIAGNOSIS — M545 Low back pain, unspecified: Secondary | ICD-10-CM

## 2018-01-03 DIAGNOSIS — R2689 Other abnormalities of gait and mobility: Secondary | ICD-10-CM

## 2018-01-03 DIAGNOSIS — M7918 Myalgia, other site: Secondary | ICD-10-CM

## 2018-01-03 DIAGNOSIS — M25612 Stiffness of left shoulder, not elsewhere classified: Secondary | ICD-10-CM

## 2018-01-03 DIAGNOSIS — G8929 Other chronic pain: Secondary | ICD-10-CM

## 2018-01-03 NOTE — Interdisciplinary (Signed)
Physical Therapy Daily Follow up Note    Referring Physician: Lysle Pearl           Preferred Language:English    Patient to continue therapy for     ICD-10-CM ICD-9-CM    1. Chronic bilateral low back pain without sciatica M54.5 724.2     G89.29 338.29    2. Left hip pain M25.552 719.45    3. Balance problem R26.89 781.99    4. Shoulder stiffness, left M25.612 719.51    5. Lumbar radiculopathy M54.16 724.4    6. Myofascial pain on left side M79.18 729.1        SUBJECTIVE AND OBJECTIVE     Row Name 01/03/18 1400          Patient stated Goal    Patient stated goal  painfree and return to work, leisure and ADL's without deficits     Row Name 01/03/18 1400          Subjective    Status since last treatment   Pt unable to manage sx's. "I feel like its getting worse." Pt admits to non-compliance with HEP.     Row Name 01/03/18 1400          Pain Assessment    Pain Asssessment Tool  Numeric Pain Rating Scale     Row Name 01/03/18 1400          Numeric Pain Rating Scale     Pain Intensity - rating at present  7     Pain Intensity - rating at worst   9     Frequency   Constant     Description   Aching     Location   low back pain, L posterolateral hip.     Row Name 01/03/18 1400          Pain Characteristics    Exacerbating Factors Time of day - worse  in the morning     Exacerbating Factors Activity - worse while    standing;twisting;walking on uneven surfaces;walking on level surfaces;driving;changing positions;weight bearing;sitting     Exacerbating Factors Position - worse with    extension     Relieving Factors - better with  ice     Impact on function - interferes with  self care activities;mobility;work duties;leisure activities     Row Name 01/03/18 1400          Objective Findings    Objective Findings  Reviewed HEP and reprinted an HEP for pt.                                     ASSESSMENT AND PLAN     Row Name 01/03/18 1400          Assessment     Assessment   Pt presents with  inconsistencies with HEP and correct performance with HEP. PT has reviewed this with pt multiple times over the course of PT treatment. When pt was consistent with HEP, pt's sx's were manageable. However, as pt had mentioned at last appt that she was stressed out with work. PT referred pt back to MD for pain management options.     Rehab Potential  Good     Row Name 01/03/18 1400          Outpatient Treatment Plan    OP Treatment Frequency  1 time per week     OP Treatment Duration  2 weeks     Row Name 01/03/18 1400          Focus for Next Treatment    Focus for Next Treatment  -- Refer pt to MD. review HEP. 1/2 side plank, ER w/trunk rot.     Row Name 01/03/18 1400          Treatment Plan Discussion    Treatment Plan Discussion & Agreement  Patient     Patient/Family Questions  Yes - All questions asked & answered     Row Name 01/03/18 1400          Patient/Family Education    Learner(s)  Patient     Patient/family training in appropriate therapeutic interventions  Teaching reinforced;Written Home exercise program/instructions provided     Patient/family training comments  View at "[email or web address]" using code: [email or web address]     Row Name 01/03/18 1400          Outpatient Therapy Overview    Follow up recommendations  Follow up with MD regarding pain management options     Row Name 01/03/18 1600          MediCal Treatment completed today    MediCal Treatment initial 30 minutes ([id number])  Completed     MediCal Treatment Add 15 min minutes ([id number])  1     Row Name 01/03/18 1600          MediCAL Treatment today    Neuromuscular re-education   Isolated movement/techniques to gain selective motor control;Postural control activities in relation to gravity and surfaces;Trunk alignment/stability activities;Patient education       Neuromuscular re-education    low abs HS, BKFO, alternating UE's     Therapeutic exercise    Assisted exercises with tactile cues/facilitation to increase muscle recruitment;Closed kinetic chain  training;Patient education;Strengthening exercises       Therapeutic exercise    over table traction, lumbar traction over chair, lat stretch doorframe, hip hikes, HEP     Row Name 01/03/18 1400          Treatment Time     Total TIMED Treatment  (min)  30     Total Treatment Time (min)  30

## 2018-01-04 ENCOUNTER — Telehealth (INDEPENDENT_AMBULATORY_CARE_PROVIDER_SITE_OTHER): Payer: Self-pay | Admitting: Student in an Organized Health Care Education/Training Program

## 2018-01-04 NOTE — Telephone Encounter (Signed)
Symptom Call        FYI- Routing to Triage    Next office visit:  02/16/2018    What symptom is the patient experiencing?    Patient is scheduled on 5/6 with resident to discuss ongoing back pain, denies any severe pain.  Per patient she currently has back pain however requested to be scheduled on 5/6. Agent routing as an FYI per protocol.     Name of PCP Provider: [name]   Insurance Coverage Verified: Active- in network  Last office visit: 07/13/2017    Who is reporting the symptoms? Incoming call from patient    Is this a new or ongoing symptom? ongoing  Estimated time since experiencing symptom(s)? Ongoing     Best way to contact patient: [phone] (mobile)   Alternative communication method: [phone] (mobile)       Is the call received after 3 PM? No

## 2018-01-10 ENCOUNTER — Encounter (INDEPENDENT_AMBULATORY_CARE_PROVIDER_SITE_OTHER): Payer: Self-pay | Admitting: Rehabilitative and Restorative Service Providers"

## 2018-01-10 ENCOUNTER — Inpatient Hospital Stay (INDEPENDENT_AMBULATORY_CARE_PROVIDER_SITE_OTHER)
Admit: 2018-01-10 | Discharge: 2018-01-10 | Disposition: A | Discharge status: Home Health | Payer: Medicaid Other | Attending: Internal Medicine | Admitting: Internal Medicine

## 2018-01-10 ENCOUNTER — Encounter (INDEPENDENT_AMBULATORY_CARE_PROVIDER_SITE_OTHER): Payer: Self-pay | Admitting: Student in an Organized Health Care Education/Training Program

## 2018-01-10 ENCOUNTER — Ambulatory Visit (INDEPENDENT_AMBULATORY_CARE_PROVIDER_SITE_OTHER): Payer: Medicaid Other | Admitting: Rehabilitative and Restorative Service Providers"

## 2018-01-10 ENCOUNTER — Ambulatory Visit (INDEPENDENT_AMBULATORY_CARE_PROVIDER_SITE_OTHER): Payer: Medicaid Other | Admitting: Student in an Organized Health Care Education/Training Program

## 2018-01-10 VITALS — BP 108/71 | HR 88 | Temp 97.0°F | Resp 16 | Ht 70.0 in | Wt 163.0 lb

## 2018-01-10 DIAGNOSIS — M47818 Spondylosis without myelopathy or radiculopathy, sacral and sacrococcygeal region: Secondary | ICD-10-CM

## 2018-01-10 DIAGNOSIS — M4696 Unspecified inflammatory spondylopathy, lumbar region: Secondary | ICD-10-CM

## 2018-01-10 DIAGNOSIS — M4698 Unspecified inflammatory spondylopathy, sacral and sacrococcygeal region: Secondary | ICD-10-CM

## 2018-01-10 DIAGNOSIS — M545 Low back pain: Secondary | ICD-10-CM

## 2018-01-10 DIAGNOSIS — M47819 Spondylosis without myelopathy or radiculopathy, site unspecified: Secondary | ICD-10-CM

## 2018-01-10 DIAGNOSIS — G8929 Other chronic pain: Secondary | ICD-10-CM

## 2018-01-10 NOTE — Progress Notes (Signed)
ATTENDING NOTE:    Chief Complaint   Patient presents with    Follow Up     discuss pt       SUBJECTIVE:  I reviewed the history, chart, and resident's note and agree with the documentation, with the following additions/changes:    History of present illness (HPI):  Haley Barrett is a 37 year old female who presents for low back pain.  Chronic, known degenerative changes on prior imaging.  Doing PT, massages, etc.  Very active in general.  NSAIDs help, decreasing activity helps.    OBJECTIVE: I have personally interviewed and examined the patient and I concur with the resident's exam.     MEDICAL PLAN of CARE:   Assessment and plan reviewed with the resident physician, and I agree with the resident's plan as documented from today's visit.  See the resident's note for further details.    Continue with conservative tx, may benefit from pain med eval, may benefit from SI joint or facet joint injections.

## 2018-01-10 NOTE — Patient Instructions (Addendum)
You can take up to  of ibuprofen twice per day if you are having a lot of pain. I would not do this on a regular basis, bit it is ok to do from time to time.     Please call to make an appointment with Dr. Verlin Grills with orthopedics     Please go to xray to get xrays performed.

## 2018-01-10 NOTE — Progress Notes (Signed)
Internal Medicine Acute Visit Clinic Note    Chief Complaint: options for LBP treatment     History of Present Illness:  Haley Barrett is a 37 year old female with LBP x2 years who presents to discuss further options for LBP treatment     LBP x2 years. Location is in paraspinal area bilaterally and down into glutes. Also with pain radiating over coccyx. Has done PT for 1 year. Switched her mattress and it didn't help.       Plays volleyball. Gets worse when she plays. Really frustrated that she is limited in her athletics by her back      [place] horses. Broke collarbone and humerus. No vertebral fractures       Review of Systems   Constitution: Negative for chills and fever.   HENT: Negative for hearing loss and sore throat.    Eyes: Negative for blurred vision and double vision.   Cardiovascular: Negative for chest pain, dyspnea on exertion, leg swelling and near-syncope.   Respiratory: Negative for cough and shortness of breath.    Hematologic/Lymphatic: Negative for adenopathy.   Skin: Negative for rash and suspicious lesions.   Musculoskeletal: as above   Gastrointestinal: Negative for abdominal pain, diarrhea, hematochezia and jaundice.   Genitourinary: Negative for dysuria and hematuria.   Neurological: Negative for focal weakness and numbness.   Psychiatric/Behavioral: Negative for depression. The patient does not have insomnia.    Allergic/Immunologic: Negative for environmental allergies and persistent infections.       Past Medical/Surgical/Family History   Past medical/surgical and family history have been reviewed and updated in the chart.     Past Medical History:   Diagnosis Date    ADD (attention deficit disorder)     Clavicular fracture        Past Surgical History:   Procedure Laterality Date    ------------OTHER-------------  37 yo    humerus ORIF        Family History   Problem Relation Name Age of Onset    Diabetes Paternal Uncle      Breast Cancer Paternal 6      Breast Cancer Mother  51     Breast Cancer Paternal Grandmother      Colon Cancer Neg Hx      Hypertension Neg Hx      Cholesterol/Lipid Disorder Neg Hx         Social History:   Social History     Socioeconomic History    Marital status: Single     Spouse name: Not on file    Number of children: Not on file    Years of education: Not on file    Highest education level: Not on file   Occupational History    Not on file   Tobacco Use    Smoking status: Never Smoker    Smokeless tobacco: Never Used   Substance and Sexual Activity    Alcohol use: Yes     Comment: socially, couple drinks/week     Drug use: Yes     Types: Marijuana     Comment: rare marijuana    Sexual activity: Not Currently     Partners: Male     Birth control/protection: Pill   Social Activities of Daily Living Present    Not on file   Social History Narrative    Single    Psychotherapist - just beginning, in Biomedical engineer. Went to school in [place].    Born in [place]  and moved to [place] 6 years ago    Tour guide-food and history tours     Hobbies: beach volleyball, horse back riding, exercise        Allergies:   No Known Allergies    Medications:   Current Outpatient Medications on File Prior to Visit   Medication Sig Dispense Refill    Amphetamine-Dextroamphetamine (ADDERALL PO) as needed.      drospirenone-ethinyl estradiol (YAZ) 3-0.02 MG tablet Take 1 tablet by mouth daily. 84 tablet 3    zolpidem (AMBIEN) 10 MG tablet Take 1 tablet (10 mg) by mouth nightly as needed for Insomnia. 30 tablet 0     No current facility-administered medications on file prior to visit.        Physical Examination:  BP 108/71 (BP Location: Left arm, BP Patient Position: Sitting, BP cuff size: Regular)    Pulse 88    Temp 97 F (36.1 C) (Oral)    Resp 16    Ht '5\' 10"'$  (1.778 m)    Wt 73.9 kg (163 lb)    SpO2 97%    BMI 23.39 kg/m      General: wdwn. Fit.   HEENT: EOMI, anicteric sclera.   Lungs: non labored on room air. Normal chest excursion   Abdomen: Soft,  non  distended.  Extremities:  WWP. No edema   Back: limited range of motion with extension and side bending due to pain. Can bend all the way forward without any issue. 5/5 strength with hip flexion, knee extension/flexion. Straight leg negative. Log roll negative. No pain at IT band or lateral hip   Skin:  No rashes or lesions.  Psych: normal affect and mood       Labs:   I have reviewed the labs for this encounter and they are presented below:      CBC:  Lab Results   Component Value Date    WBC 8.0 12/17/2015    HGB 13.9 12/17/2015    HCT 41.7 12/17/2015    PLT 316 12/17/2015     CHEM:  Lab Results   Component Value Date    NA 139 12/17/2015    K 3.9 12/17/2015    CL 99 12/17/2015    BICARB 21 (L) 12/17/2015    BUN 13 12/17/2015    CREAT 0.83 12/17/2015    GLU 109 (H) 12/17/2015    [place] 9.4 12/17/2015     COAG:  No results found for: PT, PTT, INR  LFTs:  Lab Results   Component Value Date    AST 29 12/17/2015    ALT 30 12/17/2015    ALK 62 12/17/2015    TP 7.7 12/17/2015    ALB 4.4 12/17/2015    TBILI 0.28 12/17/2015           Relevant imaging:   L spine xrays 2018   No acute osseous abnormality of the lumbar spine.    Mild facet osteoarthropathy from L4-S1. No evidence of instability.    Minimal degenerative changes of the sacroiliac joints and pubic symphysis.    ASSESSMENT AND PLAN:   Haley Barrett is a 37 year old female with chronic LBP who presents for further options regarding her pain     #Low back pain:  #SI joint arthritis   #Facet arthropathy   Has degenerative changes seen on xray, but I'm not convinced that these completely explain her pain. She responds well to NSAIDs, which suggests an inflammatory process. Not a lot  of radicular pain, so not sure if ESI injections would provide significant benefit. PT has been somewhat effective, so I encouraged her to continue with this approach. We discussed using NSAIDs PRN as she has been hesitant to do so. We discussed the side effects of NSAIDs, but I stated that  occasional use is well tolerated. Will also make referral to orthopedcs to better elucidate the etiology of her pain and see whether her condition would be amenable to injections. Will also repeat L spine films as it has been some time since her last imaging   -L spine films   - orthopedics referral with Dr. Radene Knee        Return to clinic as previously scheduled       Patient seen and plan discussed with Dr. Berdine Addison       Dr. Dereck Ligas, MD   PGY3, Internal Medicine

## 2018-01-28 ENCOUNTER — Encounter (HOSPITAL_BASED_OUTPATIENT_CLINIC_OR_DEPARTMENT_OTHER): Payer: Self-pay | Admitting: Orthopaedic Surgery

## 2018-01-28 ENCOUNTER — Ambulatory Visit: Payer: Medicaid Other | Attending: Internal Medicine | Admitting: Orthopaedic Surgery

## 2018-01-28 VITALS — BP 112/75 | HR 60 | Temp 97.8°F

## 2018-01-28 DIAGNOSIS — M539 Dorsopathy, unspecified: Secondary | ICD-10-CM | POA: Insufficient documentation

## 2018-01-28 DIAGNOSIS — M47816 Spondylosis without myelopathy or radiculopathy, lumbar region: Secondary | ICD-10-CM | POA: Insufficient documentation

## 2018-01-28 DIAGNOSIS — F988 Other specified behavioral and emotional disorders with onset usually occurring in childhood and adolescence: Secondary | ICD-10-CM | POA: Insufficient documentation

## 2018-01-28 DIAGNOSIS — M4698 Unspecified inflammatory spondylopathy, sacral and sacrococcygeal region: Secondary | ICD-10-CM | POA: Insufficient documentation

## 2018-01-28 MED ORDER — ADVIL PO
ORAL | Status: DC | PRN
Start: ? — End: 2018-03-02

## 2018-01-28 NOTE — Progress Notes (Signed)
Attending Note:   Dr. [name] requests Orthopaedic Surgical consultation for the patient Haley Barrett for problems related to the   Chief Complaint   Patient presents with    New Patient    Low Back Pain    Buttocks Pain     bilat side    Leg Pain     side of left leg        My report follows:    Subjective:   I reviewed the history and medical record, received and reviewed the H&P intake form, and interviewed and examined the patient.    History of present illness (HPI): 37 year old female here for Orthopaedic Surgical evaluation to treat her pain and functional impairment.    She reports pain 3-10/10. She reports pain ongoing for almost 2 years now, has been going to several sessions of PT at [place] for 18 mos. PTs give her a clean 'bill of health' and identify no further tx issues. She is active with her home exercise program. Has been taking ibuprofen chronically at various doses. No doses seem to have an effect. She's tried acupuncture. Pain is about the left more than right side, around the facet region of L5/S1. It can radiate down across the sacral region. Pain prevents her from doing much more than walking her dog 30 minutes BID. She will feel ok during the walk but will be in increased pain afterwards. Sleeping is fine but uncomfortable.    Per PCP note of 01-10-18: "LBP x2 years. Location is in paraspinal area bilaterally and down into glutes. Also with pain radiating over coccyx. Has done PT for 1 year. Switched her mattress and it didn't help.       Plays volleyball. Gets worse when she plays. Really frustrated that she is limited in her athletics by her back      R[place] horses. Broke collarbone and humerus. No vertebral fractures"    I reviewed the past medical history/problem list, allergies, medications, family history and social history from the electronic medical record, and on the intake paperwork:    Patient Active Problem List   Diagnosis    ADD (attention deficit disorder)    Birth  control    Finger dislocation, subsequent encounter     No Known Allergies  Current Outpatient Medications   Medication Sig Dispense Refill    Amphetamine-Dextroamphetamine (ADDERALL PO) as needed.      drospirenone-ethinyl estradiol (YAZ) 3-0.02 MG tablet Take 1 tablet by mouth daily. 84 tablet 3    Ibuprofen (ADVIL PO) as needed.      zolpidem (AMBIEN) 10 MG tablet Take 1 tablet (10 mg) by mouth nightly as needed for Insomnia. 30 tablet 0     No current facility-administered medications for this visit.      Family History   Problem Relation Name Age of Onset    Diabetes Paternal Uncle      Breast Cancer Paternal Aunt      Breast Cancer Mother  73    Breast Cancer Paternal Grandmother      Colon Cancer Neg Hx      Hypertension Neg Hx      Cholesterol/Lipid Disorder Neg Hx     No hereditary or high-risk disease identified otherwise.    Social History     Tobacco Use    Smoking status: Never Smoker    Smokeless tobacco: Never Used   Substance Use Topics    Alcohol use: Yes     Comment:  socially, couple drinks/week     Drug use: Yes     Types: Marijuana     Comment: rare marijuana       Review of Systems: as indicated in the history of present illness and per the intake form. All others reviewed and were negative.    Objective:   Vitals pain 3-10  BP 112/75 (BP Location: Right arm, BP Patient Position: Sitting, BP cuff size: Regular)    Pulse 60    Temp 97.8 F (36.6 C) (Oral)   Gen WNWD, Pulm No SOB, Abd no discomfort, CV 1+ pulses  MS cooperative and pleasant  HEENT pupils not pinprick  Neuro normal babinski, symmetric normal DTRs, no clonus on ankle jerk, full strength hf/ke/df/ehl  FXN walks on tip toes  Spine forward flexion to 180  degrees,   facet loading maneuvers painful   MSK  hip ROM painless, SLR 90 degrees  Left IT band minimally tender (she uses foam roller)  Left hip abd strength 5/5    Studies        X-RAY LUMBOSACRAL SPINE 2 OR 3 VIEWS; X-RAY HIPS BILATERAL WITH PELVIS 2  VIEWS    CLINICAL HISTORY:  History of lumbosacral spine degenerative change.    TECHNIQUE:  Two views of the lumbar spine, five views of the bilateral hips.    COMPARISON:  Lumbar spine radiograph 02/27/2016.    FINDINGS:  5 lumbar type vertebral bodies are identified. Vertebral body height is maintained, no evidence of fracture. There is grade 1 anterolisthesis of L4 on L5, grade 1 retrolisthesis of L5 on S1. Alignment is otherwise normal. Disc spaces are preserved. Mild lower lumbar facet arthropathy. Hip joint space is preserved bilaterally. Sacroiliac joints are unremarkable. Mild pubic symphysis osteoarthrosis. Osseous mineralization and regional soft tissues are within normal limits    Signed by: Leonia Reader 01/10/2018 14:08:51   Impression     IMPRESSION:  No acute osseous abnormality.     Assessment and plan:     Chronic back pain despite conservative care x 2 years    This patient does not have a condition amenable to Orthopaedic surgery at this time. In the meanwhile, here are management suggestions we discussed:    Medication management - continue current plan, consider topical patches    1. MRI LS SPINE    2. Trial of topical patches:    Lidocaine patches at [facility] for example      Heat wraps at [employer] or by [employer]          3. Follow up with Dr Cherly Hensen for MRI imaging review, treatment planning    More than half of the 30 minute clinic appointment time was spent face-to-face with the patient to provide education, counseling and coordination of care. We discussed issues as described above. All of the patient's questions were answered to their satisfaction.    Sherrie George, M.D., Ph.D.  Clinical Professor, [facility] Department of Orthopaedic Surgery  Chief, Physical Medicine and Rehabilitation   [place] [employer] medical consultant  MLB [facility] medical consultant    [email or web address]    CC: [name]

## 2018-01-28 NOTE — Patient Instructions (Addendum)
It was a pleasure seeing you today! You were seen today by Dr. Cherly Hensen. Today, we discussed:     1. MRI LS SPINE    2. Trial of topical patches:    Lidocaine patches at [facility] for example      Heat wraps at [employer] or by [employer]          3. Follow up with Dr Cherly Hensen for MRI imaging review, treatment planning      Take care!  Sherrie George, MD PhD

## 2018-02-16 ENCOUNTER — Encounter (INDEPENDENT_AMBULATORY_CARE_PROVIDER_SITE_OTHER): Payer: Medicaid Other | Admitting: Student in an Organized Health Care Education/Training Program

## 2018-02-23 ENCOUNTER — Telehealth (HOSPITAL_BASED_OUTPATIENT_CLINIC_OR_DEPARTMENT_OTHER): Payer: Self-pay | Admitting: Orthopaedic Surgery

## 2018-02-23 NOTE — Telephone Encounter (Signed)
-----   Message from Golda AcreAaron Espinoza sent at 02/23/2018  9:29 AM PDT -----  Regarding: Authorization Denied  Contact: [phone]  Dr. Cherly Hensenhang:    [name] has denied the request for the MRI LUMBAR SPINE W/O CONTRAST CPT [id number] to be performed on DOS 02/28/2018. Authorization has been denied due to no physical therapy foe 4-6 weeks in the last six months. Case is set to close 03/01/2018.    To overturn this decision, either yourself; a nurse practitioner, or physician's assistant may call [name] at [phone] and select option# 1. Please use the following information to access the peer to peer. [name]([place], [place] MRN: [id number] TRACKING#: [id number] DOB: [birth year])    Thank You,  Berenice BoutonAaron E.  Patient Access Dept.  [phone] ph

## 2018-02-23 NOTE — Telephone Encounter (Signed)
Please share last clinic note with insurance, that she "has been going to several sessions of PT at [facility] for 18 mos."    Clinic notes in Epic also do show many PT sessions: continuously from 01/03/2018 - 06/2017, and 02/2017 - 06/2016

## 2018-02-25 ENCOUNTER — Telehealth (HOSPITAL_BASED_OUTPATIENT_CLINIC_OR_DEPARTMENT_OTHER): Payer: Self-pay | Admitting: Orthopaedic Surgery

## 2018-02-25 NOTE — Telephone Encounter (Signed)
Message received. No phone number or auth # available to view in epic mobile now. Will check back later.

## 2018-02-25 NOTE — Telephone Encounter (Signed)
Spoke to Blue AshAaron from physician access. He has sent PT documentation and states that [name] has continued to deny MRI and needs to have Dr. Cherly Hensenhang call. They will not accept RN or him to check with MD if the notes were received by reviewer.

## 2018-02-25 NOTE — Telephone Encounter (Signed)
Patient calling states her insurance has denied the MRI that Dr. Cherly Hensenhang ordered. Patient is wondering if Dr. Cherly Hensenhang can call the insurance or do anything to help get MRI approved. (see previous telephone encounter)

## 2018-02-25 NOTE — Telephone Encounter (Signed)
Pt states MRI was cancel because Dr. Cherly Hensenhang need it to do peer to peer. Asking for help in this matter, please see previous message.

## 2018-02-26 ENCOUNTER — Encounter (HOSPITAL_BASED_OUTPATIENT_CLINIC_OR_DEPARTMENT_OTHER): Payer: Self-pay | Admitting: Orthopaedic Surgery

## 2018-02-26 NOTE — Progress Notes (Signed)
I called for insurance authorization today. They are out of the office, however I left a message and took them up on their offer for urgent authorization. My instructions were to fax the new information/request to [phone].    Clinic notes in Epic document many PT sessions: continuously from 01/03/2018 - 06/2017, and 02/2017 - 06/2016    I left member's name/DOB/MRN, tracking #) on the [facility].

## 2018-02-28 ENCOUNTER — Ambulatory Visit (HOSPITAL_BASED_OUTPATIENT_CLINIC_OR_DEPARTMENT_OTHER): Payer: Medicaid Other

## 2018-02-28 NOTE — Telephone Encounter (Signed)
To overturn this decision, either yourself; a nurse practitioner, or physician's assistant may call [name] at [phone] and select option# 1. Please use the following information to access the peer to peer. Haley Barrett(Redlich, [place] MRN: [id number] TRACKING#: [id number] DOB: November 19, 1980)    Will send to provider for peer to peer.

## 2018-03-01 ENCOUNTER — Ambulatory Visit (HOSPITAL_BASED_OUTPATIENT_CLINIC_OR_DEPARTMENT_OTHER): Payer: Medicaid Other | Admitting: Orthopaedic Surgery

## 2018-03-01 NOTE — Progress Notes (Signed)
Haley Barrett is a 37 year old female here for annual health maintenance visit. Patient has one issue to address at today's visit:    ADD:  On Adderall for 5 years, helps with concentration  Not losing weight, sleeping okay.    Insomnia:  Ambien 5mg  daily    Gets both Adderall and Ambien from psychiatrist.    Chronic LBP that has flared up over the past few weeks.  No acute trauma.  Has tried physical therapy x two years with only minimal improvement in pain.  Pain more left lower lumbar area that sometimes radiates to left leg.  No weakness.  But pain is limiting her usual activities.  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  No B/B dysfunction.  Being seen by PMR who have ordered a MRI Back.         Current problem list/history reviewed and updated as follows:  Patient Active Problem List   Diagnosis    ADD (attention deficit disorder)    Birth control    Finger dislocation, subsequent encounter         Past Surgical History:   Procedure Laterality Date    ------------OTHER-------------  37 yo    humerus ORIF        GYN history:   Last Pap 07/13/2017,  normal. With negative HPV testing  She has nohistory of abnormal Pap  Menarche: age 89yo  80P0  OCP's on and off since 37 yo, on for acne reasons  Menstrual history: regular  No LMP recorded. (Menstrual status: Oral contraception).  FH:  Mother with breast cancer  Diagnosed in early 5560's      Current Outpatient Medications   Medication Sig    Amphetamine-Dextroamphetamine (ADDERALL PO) as needed.    drospirenone-ethinyl estradiol (YAZ) 3-0.02 MG tablet Take 1 tablet by mouth daily.    Ibuprofen (ADVIL PO) as needed.    zolpidem (AMBIEN) 10 MG tablet Take 1 tablet (10 mg) by mouth nightly as needed for Insomnia.     No current facility-administered medications for this visit.      Drug allergies: Patient has no known allergies.    Family and social history reviewed and updated as follows:  Social History     Tobacco Use    Smoking status: Never Smoker       Smokeless tobacco: Never Used   Substance Use Topics    Alcohol use: Yes     Comment: socially, couple drinks/week     Drug use: Yes     Types: Marijuana     Comment: rare marijuana     Social History     Social History Narrative    Single    Psychotherapist - just beginning, in [employer]. [place]    [place]    Tour guide-food and history tours     Hobbies: beach volleyball, horse back riding, exercise      Family History   Problem Relation Name Age of Onset    Diabetes Paternal Uncle      Breast Cancer Paternal Aunt      Breast Cancer Mother  360    Breast Cancer Paternal Grandmother      Colon Cancer Neg Hx      Hypertension Neg Hx      Cholesterol/Lipid Disorder Neg Hx         HEALTH MAINTENANCE:   Lipid profile:  Lab Results  Component Value Date    CHOL 211 03/22/2017    HDL 119 03/22/2017    LDLCALC 78 03/22/2017    TRIG 70 03/22/2017     Mammogram: not indicated at this time, would start screening at 37yo or earlier if abnormal signs/symptoms   Bone Density: DEXA not indicated  Colon cancer screening: colonoscopy not indicated  Tetanus booster: had Tdap when she was 37yo  Pneumovax: not indicated  Chickenpox/Varicella vaccine: had chickenpox  Influenza: 2014    ROS:12 system: Negative except for what is bolded  Constitutional: negative for: fatigue, night sweats, weight loss, malaise, anorexia, fever.  Eyes: negative for:  blurry vision, red eyes, eye pain.  Ears, Nose, Mouth, Throat: negative for: ear pain, tinnitus, dental problems, odynophagia  CV: negative for:  palpitations, chest pain, orthopnea, lower extremity edema  Resp: negative for:  cough, shortness of breath, pleuritic pain, wheezing, dyspnea on exertion.  GI: negative for: vomiting, abdominal pain, constipation, diarrhea, jaundice, change in stool caliber or rectal bleeding  GU: negative for: nocturia, dysuria, frequency, hesitancy, hematuria, incontinence,    Musculoskeletal: negative for: back pain, muscle pain, joint swelling, joint pain, joint redness  Neuro: negative for: confusion, headaches, seizures, vertigo, weakness  Psych: negative for: anhedonia, depressed mood, anxiety, impaired concentration.  Endo: negative for: cold intolerance, heat intolerance, polyphagia, polydipsia, polyuria.  Allergy/Immun: negative for: hay fever.  GYN: negative for: vaginal discharge, spotting between periods, excessive menstruation, nipple bleeding or discharge, tenderness in breast.    PEX: Blood pressure 110/70, pulse 72, temperature 98.5 F (36.9 C), temperature source Temporal Artery, resp. rate 16, height 5\' 10"  (1.778 m), weight 74.6 kg (164 lb 6.4 oz), SpO2 100 %, not currently breastfeeding. Body mass index is 23.59 kg/m.    GENERAL APPEARANCE: alert, no distress, cooperative.    EYES:  conjunctivae and corneas clear. PERRL, EOM's intact.   MOUTH: normal.    NECK:  Neck supple. No adenopathy, thyroid symmetric, normal size.  HEART:  Normal rate and regular rhythm, no murmurs, clicks, or gallops. JVP normal    LUNGS: clear to auscultation and percussion, no chest deformities noted.   BREASTS:  inspection negative. No nipple discharge or  Bleeding. No suspicious masses. No palpable axillary or supraclavicular adenopathy.    ABDOMEN: BS normal.  Abdomen soft, non-tender.  No masses or organomegaly.    EXTREMITIES:  no edema and distal pulses normal.  SKIN:  Skin color, texture, turgor normal. No rashes or lesions. Well healed scar over left shoulder  NEURO: Gait normal. Reflexes normal and symmetric. Sensation and strength grossly normal.  MUSCULOSKELETAL: Back with tenderness over left paraspinal area.                                 A/P:  Haley Barrett is a 37 year old female here for annual health maintenance examination.    Health Maintenance Discussed    Provided healthy diet, physical activity, calcium, vitamin D and safety recommendations (see patient instructions);  discussed age and gender appropriate self-exams as indicated by patient's age/ personal/ family health history and standard guidelines.  Particularly reviewed self-breast exams with patient.      Chronic bilateral low back pain, with sciatica presence unspecified  -     cyclobenzaprine (FLEXERIL) 10 MG tablet; Take 1 tablet (10 mg) by mouth every 8 hours as needed for Muscle Spasms.  -     naproxen (NAPROSYN) 500 MG tablet; Take  1 tablet (500 mg) by mouth 2 times daily as needed (pain). Take with meals.  - Warm compresses  - Follow up with PMR  - Patient waiting for insurance approval for MRI back    ADD:  Responds well to Adderall.  Seen by psychiatry for this.    Insomnia:  Ambien 5mg  qhs.  Seen by psychiatry for this.      RV one year for annual or as needed for routine or acute issues    Barriers to Learning assessed: none. Patient verbalizes understanding of teaching and instructions.    Wyvonnia Lora, MD  Internal Medicine   Attending Physician

## 2018-03-01 NOTE — Telephone Encounter (Signed)
[facility] and denial upheld from the notes. Will check with provider iof he called this week for peer to peer.

## 2018-03-01 NOTE — Telephone Encounter (Signed)
Phone call made - see 02-26-18 epic note.

## 2018-03-01 NOTE — Telephone Encounter (Signed)
Pt is calling in regards to the last encounter , she would like to know if the MRI has been approved or if the peer to peer had been done, Pt would like a statues update

## 2018-03-02 ENCOUNTER — Encounter

## 2018-03-02 ENCOUNTER — Ambulatory Visit (INDEPENDENT_AMBULATORY_CARE_PROVIDER_SITE_OTHER): Payer: Medicaid Other | Admitting: Internal Medicine

## 2018-03-02 ENCOUNTER — Encounter (INDEPENDENT_AMBULATORY_CARE_PROVIDER_SITE_OTHER): Payer: Self-pay | Admitting: Internal Medicine

## 2018-03-02 ENCOUNTER — Telehealth (HOSPITAL_BASED_OUTPATIENT_CLINIC_OR_DEPARTMENT_OTHER): Payer: Self-pay

## 2018-03-02 VITALS — BP 110/70 | HR 72 | Temp 98.5°F | Resp 16 | Ht 70.0 in | Wt 164.4 lb

## 2018-03-02 DIAGNOSIS — M545 Low back pain: Secondary | ICD-10-CM

## 2018-03-02 DIAGNOSIS — Z0000 Encounter for general adult medical examination without abnormal findings: Principal | ICD-10-CM

## 2018-03-02 DIAGNOSIS — G4700 Insomnia, unspecified: Secondary | ICD-10-CM

## 2018-03-02 DIAGNOSIS — G8929 Other chronic pain: Secondary | ICD-10-CM

## 2018-03-02 DIAGNOSIS — F988 Other specified behavioral and emotional disorders with onset usually occurring in childhood and adolescence: Secondary | ICD-10-CM

## 2018-03-02 MED ORDER — CYCLOBENZAPRINE HCL 10 MG OR TABS
10.0000 mg | ORAL_TABLET | Freq: Three times a day (TID) | ORAL | 0 refills | Status: DC | PRN
Start: 2018-03-02 — End: 2018-05-17

## 2018-03-02 MED ORDER — NAPROXEN 500 MG OR TABS
500.0000 mg | ORAL_TABLET | Freq: Two times a day (BID) | ORAL | 0 refills | Status: DC | PRN
Start: 2018-03-02 — End: 2018-05-03

## 2018-03-02 NOTE — Telephone Encounter (Signed)
[facility] for asisstance to see if MRI was authorized. Dr. Cherly Hensenhang (per Epic notes 02/26/18) left message with [name] and had PT notes faxed to their office.

## 2018-03-02 NOTE — Patient Instructions (Signed)
Health Maintenance Recommendations:    1. Bone Health:  It is important to maintain or increase your calcium intake to 1200-1500 mg per day for healthy bones. This can be done with calcium supplements or by increasing the amount of calcium in your diet (see below). Calcium supplements (calcium carbonate or calcium citrate) should be taken in divided doses (eg, once in morning and evening). The daily intake recommendations given above always apply to elemental calcium. Use caution when reading the labels of calcium supplements and be sure to note the amount of elemental calcium contained per serving, as many products give the calcium content per two pills. Calcium carbonate is effective and is the least expensive form of calcium. It is best absorbed with a low-iron meal (such as breakfast). Calcium carbonate is not absorbed well in people who also take a specific medication for gastroesophageal reflux (called a proton pump inhibitor or H2 blocker). Calcium citrate is absorbed best when it is taken on an empty stomach. Supplemental vitamin D3 is also recommended at 800 to 1000 IU per day. Weight bearing exercise also contributes to good bone health.      2. Healthy Diet and Lifestyle:  Follow the American Heart Association recommendations for healthy diet and lifestyle to reduce cardiovascular risk (described in more detail below).There is an excellent website which is free and allows you to tailor a diet and exercise program to your needs:    www.sparkpeople.com     3. Safety and Injury Prevention:     A.  Wear your seat belt when you are in the car   B.  Do not drink and drive; always identify a designated driver   C.  Wear sunscreen when outdoors to prevent skin cancer   D.  In your residence, be sure you have functioning smoke and carbon monoxide         detectors    4. Self-breast exams:  Conduct breast self-exams monthly; if still menstruating, perform this exam right after your period    5.  The following  immunizations have been ordered:  Immunizations are up-to-date    Lower Back Pain:    Medications:  Naprosyn 500mg  twice a day as needed for pain  Flexeril 10mg  every 8 hours as needed for muscle spasms      Details on calcium-rich foods, healthier lifestyle and and physical activity are listed below:  Calcium-Containing Foods:  Calcium is in a wide variety of foods, but some foods have much more calcium than others. The Food Group with the most calcium is the milk, yogurt and cheese group in the Food Guide Pyramid.    Non-fat Milk 1 cup         300 mg calcium   Reduced fat Milk 1 cup  300 mg calcium   Nonfat Yogurt 1 cup  490 mg calcium    Swiss cheese 1 oz.   270 mg calcium   Mozzarella, part skim 1 oz. 210 mg calcium   American Cheese 1 oz. 140 mg calcium    Cottage Cheese 1 cup,  160 mg calcium   Parmesan cheese,  2 T      140 mg calcium    Pudding, prepared  1/2 cup 150 mg calcium  Frozen yogurt  1 cup,   200 mg calcium   Ice Cream, light 1/2 cup,  200 mg calcium    There are many sources of calcium from the other food groups.    Black Beans 1 cup  120 mg calcium   Navy Beans 1 cup  130 mg calcium    Fortified Cereal 1 cup  300 mg calcium    Soybeans, cooked  1 cup 180 mg calcium    Spinach, cooked  1/2 cup 130 mg calcium   Bok Choy 1/2 cup     80 mg calcium    Kale, cooked 1/2 cup    90 mg calcium    Corn Tortilla 1, 6 inch     50 mg calcium   Greens, mustard  1/2 cup 100 mg calcium   Fortified [place] Juice 1 cup 300 mg calcium    Canned Salmon 3 oz  180 mg calcium  Fortified Cereal  1 cup 300 mg calcium  Waffle, fortified 1  150 mg calcium  Soy milk, fortified 1 cup 400 mg calcium  Greens, mustard 1/2 cup 100 mg calcium  Tofu  1 cup,      40 mg calcium  Almonds 2 oz.   150 mg calcium  Oysters 3 oz.     80 mg calcium    The [employer] of [address] of [employer] / The [employer] of [address] / The [employer] of [address] of [employer] / [place].  [employer] & [employer] / The [place] of [place] Dept of [employer] /[place] Osteoporosis Coalition     American Heart Association  Diet and Lifestyle Goals for Cardiovascular Disease Risk Reduction:  o  Consume an overall healthy diet.  o  Aim for a healthy body weight. A healthy body weight is currently defined as a body mass index (BMI) of 18.5 to 24.9 kg/m2. Overweight is a BMI between 25 and 29.9 kg/m2, and obesity is a BMI 30 kg/m2. Today your Body mass index is 23.59 kg/m.[place] Kitchen  o  Aim for recommended levels of low-density lipoprotein (LDL) cholesterol, high-density lipoprotein (HDL) cholesterol, and triglycerides.  o  Aim for a normal blood pressure.  o  Aim for a normal blood glucose (blood sugar) level.  o  Be physically active.The AHA recommends that all adults accumulate 30 minutes of physical activity most days of the week. Additional benefits will likely be derived if activity levels exceed this minimum recommendation. At least 60 minutes of physical activity most days of the week is recommended for adults who are attempting to lose weight or maintain weight loss  o  Avoid use of and exposure to tobacco products.    Practical Tips to Implement the American Heart Association Diet and Lifestyle Recommendations:     Lifestyle  o  Know your caloric needs to achieve and maintain a healthy weight. For most people, reductions of [phone] calories/day will produce a recommended weight loss of 1-2 pounds per week. A low calorie diet for most women is 1000-1200 calories per day and 1200-1600 calories per day for most men.  o   Know the calorie content of the foods and beverages you consume.  o  Track your weight, physical activity, and calorie intake.  o  Prepare and eat smaller portions.  o  Track and, when possible, decrease screen time (eg, watching television, surfing [employer], playing computer games).  o   Incorporate physical movement into habitual activities.  o  Do not  smoke or use tobacco products.  o  If you consume alcohol, do so in moderation (equivalent of no more than 1 drink in women or 2 drinks in men per day).    Food  choices and preparation  o  Use the nutrition facts panel and ingredients list when choosing foods to buy.  o  Eat fresh, frozen, and canned vegetables and fruits without high-calorie sauces and added salt and sugars.  o  Replace high-calorie foods with fruits and vegetables.  o  Increase fiber intake by eating beans (legumes), whole-grain products, fruits, and vegetables.  o  Use liquid vegetable oils in place of solid fats.  o   Limit beverages and foods high in added sugars. Common forms of added sugars are sucrose, glucose, fructose, maltose, dextrose, corn syrups,concentrated fruit juice, and honey.  o  Choose foods made with whole grains. Common forms of whole grains are whole wheat, oats/oatmeal, rye, barley, corn, popcorn, brown rice, wild rice, buckwheat, triticale, bulgur (cracked wheat), millet, quinoa, and sorghum.  o  Cut back on pastries and high-calorie bakery products (eg, muffins, doughnuts).  o  Select milk and dairy products that are either fat free or low fat.  o  Reduce salt intake http://Leipsic.net/ the sodium content of similar products (eg, different brands of tomato sauce) and choosing products with less salt; choosing versions of processed foods, including cereals and baked goods, that are reduced in salt; and limiting condiments (eg, soy sauce, ketchup).  o  Use lean cuts of meat and remove skin from poultry before eating.  o  Limit processed meats that are high in saturated fat and sodium.  o  Grill, bake, or broil fish, meat, and poultry.  o  Incorporate vegetable-based meat substitutes into favorite recipes.  o  Consume whole vegetables and fruits in place of juices.

## 2018-03-03 ENCOUNTER — Telehealth (HOSPITAL_BASED_OUTPATIENT_CLINIC_OR_DEPARTMENT_OTHER): Payer: Self-pay | Admitting: Orthopaedic Surgery

## 2018-03-03 NOTE — Telephone Encounter (Signed)
Pt calling to request MRI status be change to STAT. She is no able to get appt any time soon.  MRI Lumbar Spine W/O Contrast [[id number]][[id number]] (Order [id number]        Best # [phone]

## 2018-03-03 NOTE — Telephone Encounter (Signed)
Routed msg to Dr Cherly Hensenhang to consider MRI as STAT, waiting on reply.

## 2018-03-04 DIAGNOSIS — M545 Low back pain, unspecified: Secondary | ICD-10-CM | POA: Insufficient documentation

## 2018-03-04 DIAGNOSIS — G8929 Other chronic pain: Secondary | ICD-10-CM

## 2018-03-07 NOTE — Telephone Encounter (Signed)
Noted. Left [facility] for [facility]. Done

## 2018-03-22 ENCOUNTER — Encounter (HOSPITAL_BASED_OUTPATIENT_CLINIC_OR_DEPARTMENT_OTHER): Payer: Self-pay | Admitting: Orthopaedic Surgery

## 2018-03-22 ENCOUNTER — Ambulatory Visit: Payer: Medicaid Other | Attending: Orthopaedic Surgery | Admitting: Orthopaedic Surgery

## 2018-03-22 ENCOUNTER — Other Ambulatory Visit: Payer: Self-pay

## 2018-03-22 ENCOUNTER — Encounter

## 2018-03-22 VITALS — BP 140/75 | HR 69 | Temp 97.0°F

## 2018-03-22 DIAGNOSIS — M539 Dorsopathy, unspecified: Secondary | ICD-10-CM | POA: Insufficient documentation

## 2018-03-22 NOTE — Patient Instructions (Addendum)
It was a pleasure seeing you today! You were seen today by Dr. Cherly Hensenhang. Today, we discussed:     1. Continue PT, bring MRI LS SPINE report with you to share    2. Continue advil, discontinue if you have GI upset/heartburn issues    3. Consider occasional steroid injections into the lumbar facets (for back pain) or left L4/5 epidural space (for sciatic leg pain)    4. Lumbar facet syndrome information    [email or web address]        Take care!  Haley Georgeouglas Merl Guardino, MD PhD

## 2018-03-22 NOTE — Progress Notes (Signed)
ORTHOPAEDIC PHYSICAL MEDICINE ESTABLISHED PATIENT VISIT   PCP: [name]  Reason For Visit: had concerns including Pain and Results (MRI).    History Of Present Illness: 37 year old female here to follow-up on 2 years of left low back pain, worse since April 2019. Patient rates her pain as 4.     Last Clinic Visit: 01/28/18 for pain "about the left more than right side, around the facet region of L5/S1. It can radiate down across the sacral region", DDX chronic nonspecific back pain. PLAN: topical patches, heat wraps, MRI LS SPINE    Pain continues, as a reminder: limiting volleyball 3-4 times a week, walking a dog. Feels like an old lady. Issues sitting on a barstool without a back rest. Hard to get in/out of cars. Today she describes pain also radiating down to the left calf, which is different than her description to me and her PCP previously. She clarifies that for a few weeks she had nagging left lateral thigh pain that she would knead/massage/lightly punch. She has had that intermittently in the past. MEDS advil 600 mg daily, seeing chiropractor who is treating low back and neck. Pain today mostly about facet joint region bilaterally (waist and sacral region).    Outside MRI LS SPINE taken due to unable to schedule at [facility].    Imaging review done of literature. Facet findings (bright on T2) not consistent with gout tophi (dark on T2)      Family hx of gout    Current Outpatient Medications on File Prior to Visit   Medication Sig    Amphetamine-Dextroamphetamine (ADDERALL PO) as needed.    cyclobenzaprine (FLEXERIL) 10 MG tablet Take 1 tablet (10 mg) by mouth every 8 hours as needed for Muscle Spasms.    drospirenone-ethinyl estradiol (YAZ) 3-0.02 MG tablet Take 1 tablet by mouth daily.    naproxen (NAPROSYN) 500 MG tablet Take 1 tablet (500 mg) by mouth 2 times daily as needed (pain). Take with meals.    zolpidem (AMBIEN) 10 MG tablet Take 1 tablet (10 mg) by mouth nightly as needed for Insomnia.      No current facility-administered medications on file prior to visit.        Vitals: BP 140/75    Pulse 69    Temp 97 F (36.1 C) (Oral)    LMP  (LMP Unknown)    BMI: There is no height or weight on file to calculate BMI. Pain: 4  Physical Examination:  General: Well appearing and well groomed.   Mental Status: Pleasant and cooperative.     IMAGING STUDIES: see above    Reviewed pertinent information provided by patient/provider.    Impression: back pain with some left radicular symptoms. MRI LS SPINE with facet arthropathy seen at L4/5 and left L4/5 foraminal stenosis.    Plan: The recommended plan is for     1. Continue PT, bring MRI LS SPINE report with you to share    2. Continue advil, discontinue if you have GI upset/heartburn issues    3. Consider occasional steroid injections into the lumbar facets (for back pain) or left L4/5 epidural space (for sciatic leg pain)    4. Lumbar facet syndrome information    [email or web address]    I described her imaging and we viewed internet diagrams, talked about overall options, activity levels, chiro and injections. I spoke about the procedural details of a facet vs epidural injection with xray guidance. I advised her against surgery when she asked.  We spent 25 minutes with this patient. More than 50 percent of this time was spent discussing the diagnosis and the treatment plan. All questions were answered and Haley HoehnMegan Rede understood and was satisfied with this plan.    Followup: Return to clinic PRN weeks. The patient is encouraged to call us with any questions or problems in the interim. Our contact numbers were given to the patient.      Sherrie Georgeouglas Lynniah Janoski, M.D., Ph.D.  Clinical Professor, [place] Department of Orthopaedic Surgery  Chief, Physical Medicine and Rehabilitation   [place] [employer] Team medical consultant  MLB [facility] [place] [facility] medical consultant    [email or web address]

## 2018-03-28 ENCOUNTER — Telehealth (HOSPITAL_BASED_OUTPATIENT_CLINIC_OR_DEPARTMENT_OTHER): Payer: Self-pay | Admitting: Orthopaedic Surgery

## 2018-03-28 ENCOUNTER — Encounter (HOSPITAL_BASED_OUTPATIENT_CLINIC_OR_DEPARTMENT_OTHER): Payer: Self-pay | Admitting: Orthopaedic Surgery

## 2018-03-28 DIAGNOSIS — M47816 Spondylosis without myelopathy or radiculopathy, lumbar region: Secondary | ICD-10-CM

## 2018-03-28 NOTE — Progress Notes (Signed)
Facet blocks ordered

## 2018-03-28 NOTE — Telephone Encounter (Signed)
Bilateral lumbar facet injections at l4/5 and L5/S1 ordered.    Please call the number listed below to schedule your injection procedure at the location of your choice [facility] or [facility]. Appointments are scheduled upon insurance approval of the procedure, if required. Please note that some insurances take up to 2 weeks for approval.    Locations:    [facility]   (Dr. Cherly Hensenhang: Monday PMs, Wednesday AMs)  [facility], lower level  [address]   [place] [id number]    [email or web address]    Scheduling Line: [phone], option 0       [facility]  (Dr. Cherly Hensenhang Friday AMs)  Main Operating Room, 2nd floor  [address]  [place], [place] [id number]  [email or web address]    Scheduling Line: [phone]

## 2018-03-28 NOTE — Telephone Encounter (Signed)
Haley Barrett  requested callback for advice on:  Pt is calling in because she would like to proceed with the ESI injection on spine , please create order so she can get that scheduled , please call and advice       Treating Physician:Dr. Cherly Hensenhang       LOV notes/plan: 03/22/2018  Followup: Return to clinic PRN weeks. The patient is encouraged to call us with any questions or problems in the interim. Our contact numbers were given to the patient.          Best number to be reached at:  [phone]          Route to RN pool if requesting clinical advice, route to MA if administrative advice

## 2018-03-29 ENCOUNTER — Telehealth (INDEPENDENT_AMBULATORY_CARE_PROVIDER_SITE_OTHER): Payer: Self-pay | Admitting: Student in an Organized Health Care Education/Training Program

## 2018-03-29 NOTE — Telephone Encounter (Signed)
Symptom Call          Next office visit:  Visit date not found  List the date of PCP first available:     What symptom is the patient experiencing? Patient called stating she has severe lower back pain and has been seen and scheduled for injections. Patient states that she is taking naproxen (NAPROSYN) 500 MG tablet (Order# [id number]) and its not helping as much. Patient states that injections for pain are taking long to schedule and doesn't want to be waiting with the pain.           Name of PCP Provider: Pilar GrammesAstashchanka, Anna   Insurance Coverage Verified: Active- in network  Last office visit: Visit date not found    Who is reporting the symptoms? Incoming call from patient    Is this a new or ongoing symptom? ongoing  Estimated time since experiencing symptom(s)?     Best way to contact patient: [phone] Home   Alternative communication method: . Home   @HOMEPHONE @  @WORKPHONE @

## 2018-03-29 NOTE — Telephone Encounter (Signed)
Routing to [place] appt coming up. Just an [place]

## 2018-03-29 NOTE — Telephone Encounter (Signed)
MD ACTION REQUESTED: NO/FYI only     RN ACTION: Appt with Dr. Vickii ChafeFairfield on Thu per pt's request- unable to come in Wed    Decision Support tool used: [place]    DATE: 03/29/2018   TIME: 10:32 AM  Name of PCP Provider: Pilar GrammesAstashchanka, Anna    Direct Transfer:  Spoke to pt  Informed that this call is being recorded for quality assurance purposes  PATIENT NAME: Haley Barrett  Verified DOB    Reason for call/CC: appt with PCP office; cannot get in ortho/injection for "weeks"  CC: low back pain  4/10 at rest; gets worse, shooting pain with certain movements/activity  Onset: chronic; Followed by Ortho  MRI denied by insurance  Takes a few week before getting in ortho/pain mngt/injection (waiting for auth from insurance)    Associated symptoms: radiating down to left leg, stops at knee area  Does anything make it worse? movements   Have you tried anything to relieve your symptoms? Chiro, acupuncture; naproxen, flexeril (does not like side effects; takes it at bed time PRN); heating pad  Do you feel this issue can wait for you to see your PCP or do you want to be seen sooner by any provider?  Thu appt    [place] General Risk Score 0    Pertinent PMHx:   Patient Active Problem List   Diagnosis    ADD (attention deficit disorder)    Birth control    Finger dislocation, subsequent encounter    Chronic bilateral low back pain, with sciatica presence unspecified    Lumbar spondylosis     LMP: a few days ago; on birth control; denies chance of pregnancy; not sexually active    Reviewed: med list, active med problems, health risks, chronic illness; relevant imaging  DENIES: dysuria, incontinence of bowel and bladder, numbness to buttock area, immobility, fever, chills, weakness, dizziness, diff breathing, SOB, chest pain, syncope/presyncope    Appt Scheduled (arrive 15 min early/provided address info): thu with Dr. Vickii ChafeFairfield     Future Appointments   Date Time Provider Department Center   03/31/2018  2:40 PM Rosana Fret[place]field, Bradley Scot,  MD [facility]     Advise/Plan:   Home care: increase hydration; continue home measures  OTC meds as directed; do not take more than recommended dose/24h: Rx as listed in Epic    ER Precautions reviewed: Seek care immediately if:  You have severe pain.  You have sudden stiffness and heaviness on both buttocks down to both legs.  You have numbness or weakness in one leg, or pain in both legs.  You have numbness in your genital area or across your lower back.  You cannot control your urine or bowel movements.    Patient verbalizes understanding and agrees with plan of care.  RN Encouraged patient to call back PRN or if symptoms worsen or persist.   RN will forward information to Dr. Pilar GrammesAstashchanka, Anna. /Dr. Patrick [place]/Resident Pool    RN confirmed pt's pharmacy and allergies -NOT APPLICABLE   [id number] [place] - [address]  [address]  [place] [id number]  Phone: [phone] Fax: [phone]    [id number] [address], [place] - [id number] [facility] [address]  [address] [place] [address]  [place] [id number]  Phone: [phone] Fax: [phone]     Last visit in this department Visit date not found  Next visit in this department Visit date not found

## 2018-03-30 NOTE — Telephone Encounter (Signed)
L/m regarding inj info and phone # for Dr Cherly Hensenhang to set up epidual injections.Matter resolved

## 2018-03-30 NOTE — Progress Notes (Signed)
Chief Complaint   Patient presents with    Low Back Pain       SUBJECTIVE::Haley Barrett is a 37 year old female here for acute visit.    #Chronic Low back pain  4/10 at rest, exacerbated by movement with shooting pains radiating down L leg.  Followed by ortho (seen 7/16), but cannot get in for an injection (awaiting insurance auth).  Insurance denied MRI, but paid out of pocket.  States it showed "bulging disc."  Has been using Naproxen 500mg  BID w/advil occasionally on top.  Has been seeing PT for 2 years but needs renewed referral.  Also seeing chiropracter.  Accupuncture was not helpful.  Has tried flexeril but it is very sedating.  Pain is now keeping her from sleeping and she has R sided pain as well.  Shooting pain down L side with throbbing pain in L foot. Denies urinary/fecal incontinence/retention or saddle anethesia.  No fevers/chills.    ROS:  Constitutional: negative for fever, chills, weight loss or weight gain  CV: negative for chest pain, palpitations, LEE  Respiratory: negative for SOB, DOE, cough  GI: negative for nausea, vomiting, abdominal pain    Patient Active Problem List   Diagnosis    ADD (attention deficit disorder)    Birth control    Finger dislocation, subsequent encounter    Chronic bilateral low back pain, with sciatica presence unspecified    Lumbar spondylosis       Past medical history and family history reviewed and updated today as below.    Allergies and medications reviewed and updated as below.    Past Medical History:   Diagnosis Date    ADD (attention deficit disorder)     Clavicular fracture        Social History     Tobacco Use    Smoking status: Never Smoker    Smokeless tobacco: Never Used   Substance Use Topics    Alcohol use: Yes     Comment: socially, couple drinks/week     Drug use: Yes     Types: Marijuana     Comment: rare marijuana     Social History     Social History Narrative    Single    Psychotherapist - just beginning, in [employer]. [place]    Tour guide-food and history tours     Hobbies: beach volleyball, horse back riding, exercise        No Known Allergies    Current Outpatient Medications   Medication Sig    Amphetamine-Dextroamphetamine (ADDERALL PO) as needed.    cyclobenzaprine (FLEXERIL) 10 MG tablet Take 1 tablet (10 mg) by mouth every 8 hours as needed for Muscle Spasms.    diclofenac (VOLTAREN) 1 % gel Apply 3 g topically 4 times daily.    drospirenone-ethinyl estradiol (YAZ) 3-0.02 MG tablet Take 1 tablet by mouth daily.    lidocaine (LIDODERM) 5 % patch Apply 1 patch topically every 24 hours. Leave patch on for 12 hours, then remove for 12 hours.    methylPREDNISolone (MEDROL DOSEPACK) 4 MG tablet Take as directed on package    naproxen (NAPROSYN) 500 MG tablet Take 1 tablet (500 mg) by mouth 2 times daily as needed (pain). Take with meals.    traMADol (ULTRAM) 50 MG tablet Take 1 tablet (50 mg) by mouth every 6 hours as needed for Moderate Pain (  Pain Score 4-6).    zolpidem (AMBIEN) 10 MG tablet Take 1 tablet (10 mg) by mouth nightly as needed for Insomnia.     No current facility-administered medications for this visit.        OBJECTIVE:: BP 106/73 (BP Location: Right arm, BP Patient Position: Sitting, BP cuff size: Regular)    Pulse 68    Temp 98.2 F (36.8 C) (Temporal)    Ht 5\' 10"  (1.778 m)    Wt 76.2 kg (168 lb)    LMP  (LMP Unknown)    BMI 24.11 kg/m    Gen: alert, appears uncomfortable, tearful  ENMT: OP clear without erythema or exudates  Neck: supple, no LAD  Heart:  RRR, no murmurs, rubs, or gallups  Lungs: clear to auscultation, normal respiratory effort  Abdomen: soft, non-tender.  Normoactive bowel sounds.  No masses or hepatosplenomegaly.  M/S:  No spinal or paraspinal tenderness, pain exacerbated by striaghtening her legs and lying flat.  Negative SLR bilaterally.  No edema. Antalgic gait.    Labs and chart reviewed.    A/P:  Haley Barrett  was seen today for low back pain.    Diagnoses and all orders for this visit:    Back pain, unspecified back location, unspecified back pain laterality, unspecified chronicity  Patient with now subacute worsening of chronic low back pain, and is now intolerable.  She has tried multiple modalities for pain control with little relief.  Has ESI planned, but awaiting insurance authorization.  Will try multiple modalities to improve pain to bridge until her ESI; including toradol injection in office (which provided significant relief), topical lidocaine and voltarin, medrol dose pack, and short course of tramdol.  Will obtain BMP to monitor kidney function with high doses of NSAIDS.  -     lidocaine (LIDODERM) 5 % patch; Apply 1 patch topically every 24 hours. Leave patch on for 12 hours, then remove for 12 hours.  -     diclofenac (VOLTAREN) 1 % gel; Apply 3 g topically 4 times daily.  -     Consult to [facility] - Internal; Future  -     methylPREDNISolone (MEDROL DOSEPACK) 4 MG tablet; Take as directed on package  -     traMADol (ULTRAM) 50 MG tablet; Take 1 tablet (50 mg) by mouth every 6 hours as needed for Moderate Pain (Pain Score 4-6).  -     ketorolac (TORADOL) injection 30 mg  -     CURES Review Documentation - I Reviewed CURES    Medication monitoring encounter  -     Basic Metabolic Panel, Blood Green Plasma Separator Tube; Future        Follow-up prn.  Patient verbalizes understanding and is agreeable to above plan.  Pt seen with Dr. Ileene Rubens

## 2018-03-31 ENCOUNTER — Encounter (INDEPENDENT_AMBULATORY_CARE_PROVIDER_SITE_OTHER): Payer: Self-pay | Admitting: Internal Medicine

## 2018-03-31 ENCOUNTER — Ambulatory Visit (INDEPENDENT_AMBULATORY_CARE_PROVIDER_SITE_OTHER): Payer: Medicaid Other | Admitting: Internal Medicine

## 2018-03-31 VITALS — BP 106/73 | HR 68 | Temp 98.2°F | Ht 70.0 in | Wt 168.0 lb

## 2018-03-31 DIAGNOSIS — Z5181 Encounter for therapeutic drug level monitoring: Secondary | ICD-10-CM

## 2018-03-31 DIAGNOSIS — G8929 Other chronic pain: Secondary | ICD-10-CM

## 2018-03-31 DIAGNOSIS — M549 Dorsalgia, unspecified: Secondary | ICD-10-CM

## 2018-03-31 DIAGNOSIS — M5440 Lumbago with sciatica, unspecified side: Secondary | ICD-10-CM

## 2018-03-31 MED ORDER — DICLOFENAC SODIUM 1 % EX GEL
3.00 g | Freq: Four times a day (QID) | TRANSDERMAL | 3 refills | Status: DC
Start: 2018-03-31 — End: 2018-07-11

## 2018-03-31 MED ORDER — KETOROLAC TROMETHAMINE 30 MG/ML IJ SOLN
30.00 mg | Freq: Once | INTRAMUSCULAR | Status: AC
Start: 2018-03-31 — End: 2018-03-31
  Administered 2018-03-31: 30 mg via INTRAMUSCULAR

## 2018-03-31 MED ORDER — TRAMADOL HCL 50 MG OR TABS
50.00 mg | ORAL_TABLET | Freq: Four times a day (QID) | ORAL | 0 refills | Status: DC | PRN
Start: 2018-03-31 — End: 2018-04-14

## 2018-03-31 MED ORDER — LIDOCAINE 5 % EX PTCH
1.0000 | MEDICATED_PATCH | CUTANEOUS | 0 refills | Status: DC
Start: 2018-03-31 — End: 2018-10-31

## 2018-03-31 MED ORDER — METHYLPREDNISOLONE 4 MG OR KIT
ORAL_TABLET | ORAL | 0 refills | Status: DC
Start: 2018-03-31 — End: 2018-04-14

## 2018-03-31 NOTE — Progress Notes (Signed)
Collaborative Care Encounter 03/31/2018   Length of Session 0   Unit of Consultation Individual   Mode of Consultation In office consult requested by medical provider   Medical Concerns Chronic Pain   Goal of Consult (Intake) Referral   Plan of Action PCP intervention only   What providers collaborated with [facility] clinician during encounter? Primary Care Physician- Resident   How was the visit initiated? T-CARE screen of PCP patient panel   What providers will be involved in patient follow-up? Primary Care Physician- Resident   Mode used to collaborate about patient Spoke in person with PCP and/or other health provider(s) about treatment plan   Some recent data might be hidden       Behavioral health provider spoke with Dr. Vickii ChafeFairfield about pt during T-CARE. Pt identified as potential candidate for collaborative care services due to chronic pain. Behavioral health provider was not utilized.

## 2018-03-31 NOTE — Patient Instructions (Signed)
Thank you for coming in today.    We have given you an injection of Toradol to help with your back pain.  Please also try the medrol dose pack (take as directed on package insert).  You may also use Tramadol as needed until your steroid injection.  I also wrote for lidocaine patch and topical voltarin to help with pain relief.  I renewed your PT referral.    Please go to the lab to get your blood work drawn as well so we can keep an eye on your kidney function.

## 2018-03-31 NOTE — Progress Notes (Signed)
Attending Note:  Chief Complaint   Patient presents with    Low Back Pain      Subjective:  I reviewed the history.  Patient interviewed and examined.   Jackelyn HoehnMegan Herrle is a 37 year old female is here for chronic LBP.    She is awaiting for authorization for CSI.  Unfortunately, still with significant LBP as per Dr. Vickii ChafeFairfield.  She has exhausted all conserv treatment options.  Review of Systems - 12 step ROS reviewed, and negative except as noted in HPI and PMHx.    Past Medical, Family, Social History:  As per  the resident's  Note and EPIC.  Past Medical History:   Diagnosis Date    ADD (attention deficit disorder)     Clavicular fracture      Objective:   I have examined the patient and I concur with the resident's exam.  Recent labs and imaging as well as other data reviewed.    Assessment/Plan:    Assessment and plan reviewed with the resident physician.  I agree with the resident's plan as documented.    See the resident's note for further details.    I ran the patient's name and date-of-birth through the [facility] [employer] Prescription Drug Monitoring Program CURES website which will list controlled drug prescriptions filled at [facility] pharmac[facility] in the past 12 months. The information in the patient's CURES report is consistent with their self-report and shows no evidence of medication abuse or doctor shopping.     Follow up as per resident.    Patient Instruction:  See Patient Education/Instruction section.      Barriers to Learning assessed: none. All of the patients questions were answered to patient's satisfaction. Patient verbalizes understanding of teaching and instructions and is agreeable to above plan.

## 2018-04-01 ENCOUNTER — Encounter (INDEPENDENT_AMBULATORY_CARE_PROVIDER_SITE_OTHER): Payer: Self-pay | Admitting: Internal Medicine

## 2018-04-01 ENCOUNTER — Telehealth (INDEPENDENT_AMBULATORY_CARE_PROVIDER_SITE_OTHER): Payer: Self-pay | Admitting: Student in an Organized Health Care Education/Training Program

## 2018-04-01 DIAGNOSIS — G8929 Other chronic pain: Principal | ICD-10-CM

## 2018-04-01 DIAGNOSIS — M5440 Lumbago with sciatica, unspecified side: Principal | ICD-10-CM

## 2018-04-01 MED ORDER — HYDROCODONE-ACETAMINOPHEN 5-325 MG OR TABS
1.0000 | ORAL_TABLET | Freq: Four times a day (QID) | ORAL | 0 refills | Status: DC | PRN
Start: 2018-04-01 — End: 2018-04-01

## 2018-04-01 MED ORDER — HYDROCODONE-ACETAMINOPHEN 5-325 MG OR TABS
1.0000 | ORAL_TABLET | Freq: Four times a day (QID) | ORAL | 0 refills | Status: DC | PRN
Start: 2018-04-01 — End: 2018-04-14

## 2018-04-01 NOTE — Telephone Encounter (Signed)
Thank you, yes this is OK, thanks, As

## 2018-04-01 NOTE — Telephone Encounter (Signed)
Will forward message to Dr. Ileene RubensSani who saw Haley Barrett last for her back pain.     CURES report ran for tramadol was non-concerning, and seems ok to do very short course of norco, pending review by attending physician.

## 2018-04-01 NOTE — Telephone Encounter (Signed)
Symptom Call        Next office visit:  Not scheduled    What symptom is the patient experiencing? MED REACTION  Pt started TRAMADOL last night and it made her sick to her stomach.     pts mychart msg:      04/01/18 9:14 AM   Hi,     I will try calling the office later today but I tried taking a Tramadol last night and though it helped with pain it made me so nauseous that I was pretty miserable and couldn't move much without being afraid I might vomit. I really wouldn't want to take one again. Do you think we could try a codeine derivate med? I know I have taken those in the past after my wisdom teeth and such and they have worked well and not made me sick like the Tramadol did.     Thanks,   Haley Barrett       Name of PCP Provider: Pilar GrammesAstashchanka, Anna  Insurance Coverage Verified: Active- in network  Last office visit: 03/31/18    Who is reporting the symptoms? Incoming call from patient    Is this a new or ongoing symptom? New  Estimated time since experiencing symptom(s)? yesterday    Best way to contact patient: [phone] (mobile)

## 2018-04-01 NOTE — Telephone Encounter (Signed)
MD ACTION REQUESTED: YES, please review and advise.     RN ACTION: Pt is requesting a change in medication of Tramadol because last night after she took med she had an upset stomach with nausea and vomiting. Per pt she feels better this morning but will like a change in the medications.     Per pt back is feeling a little better       Decision Support tool used: Adult Telephone Protocols 4th Edition; Janee Mornhompson    RN confirmed pt's pharmacy and allergies   [id number] [place] - [address]  [address] [place] [address]  [place] [id number]  Phone: [phone] Fax: [phone]       No future appointments.    Date: 04/01/2018   Time: 12:49 PM   Name of PCP Provider: Pilar GrammesAstashchanka, Haley     Direct call transfer to Triage RN  Patient name: Haley Barrett 37 year old   Verified DOB  Pt advised all calls are recorded for quality assurance.      Reason for call: Reaction to Tramadol   CC: upset stomach, nausea, vomiting  Onset: last night   Associated symptoms: none   Does anything make it worse? no   Have you tried anything to relieve your symptoms? Feels better today   Do you feel this issue can wait for you to see your PCP or do you want to be seen sooner by any provider?  Pt would like a message sent to provider       DENIES: fever, chills, nausea or vomiting, weakness, dizziness, diff breathing, diff swallowing, SOB, chest pain, diarrhea, back/flank pain, hematuria, hematemesis, hematochezia, or pain/pressure in lower abdomen or pelvic area.     [place] General Risk Score 0    Pertinent PMHx:   Patient Active Problem List   Diagnosis    ADD (attention deficit disorder)    Birth control    Finger dislocation, subsequent encounter    Chronic bilateral low back pain, with sciatica presence unspecified    Lumbar spondylosis       ER Precautions reviewed: Chest pain, sob, severe headache, numbness or weakness on one side, feeling faint/dizziness, confusion, slurred speech.     Patient verbalizes understanding  and agrees with plan of care.  RN Encouraged patient to call back PRN or if symptoms worsen or persist.   RN will forward information to Dr. Pilar GrammesAstashchanka, Haley.     Last visit in this department Visit date not found  Next visit in this department Visit date not found

## 2018-04-01 NOTE — Telephone Encounter (Signed)
Reviewed security print, changing to e-prescribe, multiple times, keeps changing to [employer] print. Needed to change characters in note to pharmacy:   "  Please let her know there can be some cross reaction in terms of side effects, so she could have nausea with this med as well, too." thanks, AS

## 2018-04-05 ENCOUNTER — Ambulatory Visit: Payer: Medicaid Other | Attending: Internal Medicine | Admitting: Rehabilitative and Restorative Service Providers"

## 2018-04-05 DIAGNOSIS — M549 Dorsalgia, unspecified: Secondary | ICD-10-CM | POA: Insufficient documentation

## 2018-04-05 NOTE — Interdisciplinary (Signed)
Physical Therapy Evaluation    Referring Physician Sani, Armelia    Diagnosis     ICD-10-CM ICD-9-CM    1. Back pain, unspecified back location, unspecified back pain laterality, unspecified chronicity M54.9 724.5        Preferred Language:English    Start of Service  Start of Care: 04/05/18  Reason for referral: Pain;Postural dysfunction;Strength impairment;Activity tolerance limitation                   Past Medical History:   Diagnosis Date    ADD (attention deficit disorder)     Clavicular fracture       Past Surgical History:   Procedure Laterality Date    ------------OTHER-------------  37 yo    humerus ORIF        Physical Therapy Evaluation     Row Name 04/05/18 1109          Medical History    History of presenting condition  Pt reports chronic low back pain  over 2 years ago. PT sessions helped, then play 12 games of volleyball beach 2 man.  On prednisone, ibuprofen injection, and 500 mg naproxen BID.  3 months ago exacerbated symptoms.  Chiropractic care.  History of horse back riding 1x week but history for  39-26 yo.        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     Work modifications  None     Functional Mobility Assistance Needs  None-Independent with mobility     General ADL/Self-Care Assistance Needs  None- Independent with ADLs and self care     Row Name 04/05/18 1109          Social History    Living Situation  Lives alone     Home Environment  Apartment 2nd floor elevator and stairs     Row Name 04/05/18 1109          Pain Assessment    Pain  Asssessment Tool  Numeric Pain Rating Scale     Row Name 04/05/18 1109          Numeric Pain Rating Scale     Pain Intensity - rating at present  4     Pain Intensity - rating at worst   10     Frequency   Constant with vary intensity     Description   Dull;Aching;Sharp;Radiating     Location   radiating leg L, aching low back            PT Tool Box     Row Name 04/05/18 1200          Modified Oswestry Disability Questionnaire    Back Index Total Score  19 38%     Assessment: Back Index - Current functional level impairment  20 to 39        Observations:    Standing: R handed  Used  to putting hands behind back shifting weight back and forth    Squat:    Gait:  Increased trunk motion and sway, wider stance and increased  Frontal plane motion "wiggle:    Pelvic alignment:    SLB: L 30 sec R 30 sec pain in glut and low back; Dynaimc balance: impaired lumbo pelvic differentiation     AROM Trunk   Flexion WNL   Extension WNL    Left Right   Rotation WNL WNL   Sidebend WNL WNL     Strength Left Right   Gluteus Max 5/5 5/5   Gluteus Med 4-/5 4+/5   Hip Flexors 4/5 4/5   Abdominal Strength:  Sahrman 2 b      PAIVM: pain with rebound and increased tone in lumbar paraspinals    Lumbar Spine & SI Joint Special Tests   Right Left   SIJ Compression/Distraction positive positive   Sacral Thrust positive positive   Gaenslen's positive positive   Thigh Thrust  negative negative         -     Row Name 04/05/18 1200          Patient/Family Education    Learner(s)  Patient     Patient/family training in appropriate therapeutic interventions  Initiated     Education Topic(s)  Exercise Program     Row Name 04/05/18 1200          Assessment     Assessment   Haley Barrett is a 37 yo female with chronic back history secondary to horse back riding and high level sport Midwife).  She presents today with lumbopelvic instability and impaired lumbo pelvic hip differentiation with functional activity including steps, squatting, and ambulation.  NM recruitment is impaired and paraspinals are overactive rather than TrA and deep stabilizers in back.  Pt will benefit from skilled PT to address impairments, improve functional mobility, and optimize movement strategies to avoid exacerbation of symptoms.        Rehab Potential  Excellent     Row Name 04/05/18 1200          Functional Limitation Reporting     Assessment: Back Index - Current functional level impairment  20 to 52     Row Name 04/05/18 1200          Patient stated Goal    Patient stated goal  pain free return to sport beach volley ball     Row Name 04/05/18 1200          Planned Therapy Interventions and Rationale    Manual Therapy  to decrease pain;to improve segmental joint mobility;to improve joint and soft tissue alignment;to decrease tissue tension and restriction     Neuromuscular Re-Education  to improve posture;to improve kinesthetic awareness;to improve postural control;to improve safety during dynamic activities;to improve balance;to improve coordination     Patient Education  to minimize re-injury;to increase safety during dynamic activities;to increase independence in functional activities     Therapeutic Activities  to improve ability to change body position;to improve functional mobility;to restore functional performance using graded activities;to improve transfers between surfaces     Theraputic Exercise  to increase strength;to increase range of motion;to improve posture;to increase flexibility;to improve activity tolerance     Row Name 04/05/18 1200          Treatment Plan Disussion    Include in My Healthcare  Myself;My healthcare team     Treatment Plan Discussion & Agreement  Patient  Patient/Family Questions  Yes - All questions asked & answered     Row Name 04/05/18 1200          Outpatient Therapy Overview    Start of Care  04/05/18     Row Name 04/05/18 1200          MediCal Evaluation    Medi-Cal Eval Initial 30 minutes (3920)  Completed     Medi-Cal eval 15 min addtl  [phone]([id number])            2     Row Name 04/05/18 1200          MediCAL Treatment today    Neuromuscular re-education   Trunk alignment/stability activities       Neuromuscular re-education    Trunk stabilization and TrA recruitment training   Treatment today:   1) Pelvic Tilt while pushing wall with arm overhead 5sec hold 10x  2) Pelvic Tilt while pulling theraband to 90 deg/ceiling 5 sec hold 10x  3) Pelvic Tilt while pulling theraband to 90 deg/ceiling with a march 4x on each leg then break 5 sec   4) Kegel throughout the day        Row Name 04/05/18 1200          Treatment Time     Total TIMED Treatment  (min)  60     Total Treatment Time (min)  60             The physical therapist of record is endorsed by evaluating physical therapist.

## 2018-04-06 ENCOUNTER — Encounter (HOSPITAL_BASED_OUTPATIENT_CLINIC_OR_DEPARTMENT_OTHER): Payer: Self-pay | Admitting: Hospital

## 2018-04-07 ENCOUNTER — Telehealth (HOSPITAL_BASED_OUTPATIENT_CLINIC_OR_DEPARTMENT_OTHER): Payer: Self-pay | Admitting: Orthopaedic Surgery

## 2018-04-07 NOTE — Telephone Encounter (Signed)
Pt calling to check status of ESI.   Advised pt it was denied by her insurance per Anise SalvoIesha S.  [place] # [phone]   pt will call her insurance    Ref #  [id number]

## 2018-04-07 NOTE — Telephone Encounter (Signed)
Lafonda MossesDiana calling from [place]. She is specifying that if referral order can be re sent with: clinical notes indicating pt is still currently doing PT(physical therapy) minimum notes need to indicate has completed PT 10-12 sessions for 8wks. Stating those specifics will be able to [facility], last referral did not indicate that. Please call pt to update.    If any questions please call [name] [phone]

## 2018-04-08 ENCOUNTER — Ambulatory Visit: Payer: Medicaid Other | Attending: Internal Medicine | Admitting: Rehabilitative and Restorative Service Providers"

## 2018-04-08 ENCOUNTER — Ambulatory Visit (HOSPITAL_BASED_OUTPATIENT_CLINIC_OR_DEPARTMENT_OTHER): Payer: Medicaid Other | Admitting: Rehabilitative and Restorative Service Providers"

## 2018-04-08 DIAGNOSIS — M549 Dorsalgia, unspecified: Principal | ICD-10-CM | POA: Insufficient documentation

## 2018-04-08 NOTE — Interdisciplinary (Addendum)
Physical Therapy Daily Follow up Note    Referring Physician: Iona BeardSani, Armelia           Preferred Language:English    Patient to continue therapy for     ICD-10-CM ICD-9-CM    1. Back pain, unspecified back location, unspecified back pain laterality, unspecified chronicity M54.9 724.5        SUBJECTIVE AND OBJECTIVE     Row Name 04/08/18 1400          Patient stated Goal    Patient stated goal  pain free return to sport beach volley ball     Row Name 04/08/18 1400          Subjective    Status since last treatment   Pt reports low back continues to be painful. No changes in symptoms since previous visit. Has some pain in upper back doing exercises. Notices radiating pain down L LE when jumping and noted R sided LBP that started ~1 mo ago that is bothering patient more today. Also has some plantarfascia throbbing pain at night.     Row Name 04/08/18 1400          Pain Assessment    Pain Asssessment Tool  Numeric Pain Rating Scale     Row Name 04/08/18 1400          Numeric Pain Rating Scale     Pain Intensity - rating at present  7     Pain Intensity - rating at worst   10     Frequency   Constant     Description   Dull;Aching;Sharp;Radiating     Location   radiating leg L, aching low back     Row Name 04/08/18 1400          Pain Characteristics    Exacerbating Factors Time of day - worse  in the morning     Exacerbating Factors Activity - worse while    walking on level surfaces;walking up/down elevation;driving;sitting;bending;lifting;changing positions     Relieving Factors - better with  medication;changing positions     Impact on function - interferes with  leisure activities;mobility;sleep         Objective:  Hip alignment: WNL                            ASSESSMENT AND PLAN     Row Name 04/08/18 1400          Assessment     Assessment   Pt presents with increased LBP today with no noticeable improvements in symptoms. Exercise progression had to be regressed due to pain and patient in tears.  Tolerated manual therapy with moderate muscle guarding throughout lumbar and thoracic paraspinals.  Focused abdominal strengthening with noted muscle spasm on R lumbar paraspinals.  Patient to see physician on Monday for pain management consult.    FOR NEXT VISIT, perform manual therapy to decrease muscle guarding and pain if needed, and progress lumbopelvic strengthening if able.      Rehab Potential  Excellent     Row Name 04/08/18 1400          Outpatient Treatment Plan    OP Treatment Frequency  2 times per week     OP Treatment Duration  12 weeks     OP Status of treatment  Patient evaluated and will benefit from ongoing skilled therapy     Row Name 04/08/18 1400  Focus for Next Treatment    Focus for Next Treatment  Therapeutic exercise;Therapeutic activities;Neuromuscular re-education;Manual therapy     Row Name 04/08/18 1400          Treatment Plan Discussion    Treatment Plan Discussion & Agreement  Patient     Patient/Family Questions  Yes - All questions asked & answered     Row Name 04/08/18 1400          Patient/Family Education    Learner(s)  Patient     Patient/family training in appropriate therapeutic interventions  Ongoing     Education Topic(s)  Exercise Program     Row Name 04/08/18 1400          MediCal Treatment completed today    MediCal Treatment initial 30 minutes [phone])  Completed     Row Name 04/08/18 1400          MediCAL Treatment today    Manual therapy   Other (comment)       Manual therapy   MFR B glut/piriformis with ER/IR, STM lumbar and thoracic paraspinals, gr II PAs L1-L5     Therapeutic exercise    Other (comment)       Therapeutic exercise    LTR x20, TA bracing supine march x15 B, palloff press 5# x15B     Row Name 04/08/18 1400          Treatment Time     Total TIMED Treatment  (min)  30     Total Treatment Time (min)  30         I supervised the therapy treatment of  Haley Barrett  provided by myself and therapy student. I have reviewed the note and agree with the  documented findings and care.     [address] [place], [place]  04/08/2018 3:22 PM

## 2018-04-08 NOTE — Telephone Encounter (Signed)
Patient is calling to check status of states ESI injections.

## 2018-04-11 ENCOUNTER — Telehealth (INDEPENDENT_AMBULATORY_CARE_PROVIDER_SITE_OTHER): Payer: Self-pay | Admitting: Internal Medicine

## 2018-04-11 ENCOUNTER — Ambulatory Visit (HOSPITAL_BASED_OUTPATIENT_CLINIC_OR_DEPARTMENT_OTHER): Payer: Medicaid Other | Admitting: Rehabilitative and Restorative Service Providers"

## 2018-04-11 DIAGNOSIS — G8929 Other chronic pain: Secondary | ICD-10-CM

## 2018-04-11 DIAGNOSIS — M549 Dorsalgia, unspecified: Secondary | ICD-10-CM

## 2018-04-11 NOTE — Telephone Encounter (Addendum)
Patient has completed more than 12 sessions over 8 weeks since 2017 and still continues therapy. Faxed notes as requested however I received a fax back stating it was out of the 5 day turnaround time once denied. Was told my options now are to go through a peer to peer or an appeal which could take up to 30-45 days. Peer to peer scheduled 04/21/18 between 12:30-1pm.

## 2018-04-11 NOTE — Telephone Encounter (Signed)
Controlled substances are not currently included in Pharmacy Refill Clinic protocol. Re-routing to appropriate staff for processing. Thank you.

## 2018-04-11 NOTE — Telephone Encounter (Signed)
Prescription Refill Request       Name of PCP Provider: Pilar Grammesstashchanka, Anna   Last office visit: 03/31/2018  Next office visit: Visit date not found    Allergies   Allergen Reactions   . Tramadol Nausea and Vomiting       Requested Medication/s:     HYDROcodone-acetaminophen (NORCO) 5-325 MG tablet       Patient has made an attempt to refill with pharmacy? NO    Send to:     [id number] [place] [facility] [address]  [address] [place] [address]  [place] [id number]  Phone: [phone] Fax: [phone]      Has been advised this message will be transmitted to office and if any issues can expect a response within the next 24-72 hours, otherwise pharmacy should be contacting patient when refill is processed.

## 2018-04-11 NOTE — Interdisciplinary (Signed)
Physical Therapy Daily Follow up Note    Referring Physician: Iona BeardSani, Armelia           Preferred Language:English    Patient to continue therapy for     ICD-10-CM ICD-9-CM    1. Back pain, unspecified back location, unspecified back pain laterality, unspecified chronicity M54.9 724.5    2. Chronic bilateral low back pain without sciatica M54.5 724.2     G89.29 338.29        SUBJECTIVE AND OBJECTIVE     Row Name 04/11/18 1500          Patient stated Goal    Patient stated goal  pain free return to sport beach volley ball     Row Name 04/11/18 1500          Subjective    Status since last treatment   Pt reports taking a pill before she came in today.        Row Name 04/11/18 1500          Pain Assessment    Pain Asssessment Tool  Numeric Pain Rating Scale     Row Name 04/11/18 1500          Numeric Pain Rating Scale     Pain Intensity - rating at present  4     Pain Intensity - rating at worst   7     Frequency   Constant     Description   Dull;Aching;Sharp;Radiating     Location   radiating leg L, aching low back     Row Name 04/11/18 1500          Pain Characteristics    Exacerbating Factors Time of day - worse  in the morning     Exacerbating Factors Activity - worse while    walking on level surfaces;walking up/down elevation;driving;sitting;bending;lifting;changing positions     Relieving Factors - better with  medication;changing positions     Impact on function - interferes with  leisure activities;mobility;sleep                                     ASSESSMENT AND PLAN     Row Name 04/11/18 1500          Assessment     Assessment   Patient practice rudimentary TA activation exercises in supine today. Loss of lumbo pelvic proprioception is noted with stabilization exercises. Patient to continue progression of trunk stabilization with supervision.      Rehab Potential  Excellent     Row Name 04/11/18 1500          Outpatient Treatment Plan    OP Treatment Frequency  2 times per week     OP  Treatment Duration  12 weeks     Row Name 04/11/18 1500          Focus for Next Treatment    Focus for Next Treatment  Therapeutic exercise;Therapeutic activities;Neuromuscular re-education;Manual therapy     Row Name 04/11/18 1500          Treatment Plan Discussion    Treatment Plan Discussion & Agreement  Patient     Patient/Family Questions  Yes - All questions asked & answered     Row Name 04/11/18 1500          Patient/Family Education    Learner(s)  Patient     Patient/family training in appropriate therapeutic interventions  Ongoing     Education Topic(s)  Exercise Program     Row Name 04/11/18 1500          MediCAL Treatment today    Therapeutic exercise    Core stabilization exercises       Therapeutic exercise    pelvic tilt 2x10; pelvic tilt march 2x10; swiss ball curl with iso shoulder extension 3x10; iso trunk LE resisted rot training 4 bouts; supine pallof press green TB legs on SB 2x10     Row Name 04/11/18 1500          Treatment Time     Total TIMED Treatment  (min)  30     Total Treatment Time (min)  30

## 2018-04-12 NOTE — Telephone Encounter (Signed)
Pended order for tramadol, routing to PCP  Medication Refill Request routed to Dr Margit BandaAstashchanka    Medication Requested:Tramadol 50mg   Date  last refilled:03/31/18  Amount refilled: 30      Refills:0  Last office visit with PCP: Visit date not found  Last office visit this clinic: 03/31/2018  Next office visit with PCP: Visit date not found   (should be every 3 months for opiates)  Next office visit this clinic: 04/14/2018    Last CURES report documented (should be every 4 months): 04/01/18  Controlled Substance agreement signed? no  Last urine drug screen (within 1 year):    No results found for: AMPCLASS, BARBCLASS, BENZYLCGNN, BENZDIAZCL, METHADONE, OPIATESCL, OXY, PHENCYCLDN, TETCANNABIN, UDI      Resident providers: Please document if medication approved and route to Attending Provider to e-prescribe to Pharmacy.

## 2018-04-12 NOTE — Telephone Encounter (Signed)
Would we be able to clarify with patient whether she wants the tramadol refilled? Her last message indicated that she was nauseated secondary to this medication.

## 2018-04-14 ENCOUNTER — Encounter (INDEPENDENT_AMBULATORY_CARE_PROVIDER_SITE_OTHER): Payer: Self-pay | Admitting: Student in an Organized Health Care Education/Training Program

## 2018-04-14 ENCOUNTER — Ambulatory Visit (INDEPENDENT_AMBULATORY_CARE_PROVIDER_SITE_OTHER): Payer: Medicaid Other | Admitting: Student in an Organized Health Care Education/Training Program

## 2018-04-14 VITALS — BP 125/77 | HR 68 | Temp 98.1°F | Ht 70.0 in | Wt 168.2 lb

## 2018-04-14 DIAGNOSIS — R030 Elevated blood-pressure reading, without diagnosis of hypertension: Secondary | ICD-10-CM

## 2018-04-14 DIAGNOSIS — M5440 Lumbago with sciatica, unspecified side: Secondary | ICD-10-CM

## 2018-04-14 DIAGNOSIS — G8929 Other chronic pain: Secondary | ICD-10-CM

## 2018-04-14 MED ORDER — KETOROLAC TROMETHAMINE 30 MG/ML IJ SOLN
30.00 mg | Freq: Once | INTRAMUSCULAR | Status: AC
Start: 2018-04-14 — End: 2018-04-14
  Administered 2018-04-14 (×2): 30 mg via INTRAMUSCULAR

## 2018-04-14 MED ORDER — HYDROCODONE-ACETAMINOPHEN 5-325 MG OR TABS
1.00 | ORAL_TABLET | Freq: Three times a day (TID) | ORAL | 0 refills | Status: DC | PRN
Start: 2018-04-14 — End: 2018-06-02

## 2018-04-14 MED ORDER — KETOROLAC TROMETHAMINE 30 MG/ML IJ SOLN
30.00 mg | Freq: Once | INTRAMUSCULAR | 0 refills | Status: AC
Start: 2018-04-14 — End: 2018-04-14

## 2018-04-14 NOTE — Patient Instructions (Addendum)
It was a pleasure seeing you today! Please contact me if you have any questions. The clinic number is [phone]. After hours contact number is [phone].    Here are a few of the topics that we discussed:    - for your pain, we will do another shot of ketorolac in your right side  - for your pain, we will do another short term supply of Norco until you can get your injections     Please have your labs done on the 4th floor.    ** If you are concerned about insurance coverage, costs and/or deductible, prior to doing the labs and/or tests please contact your insurance company and/or the lab. If you have high deductible or self-pay, then consider outside [place] labs like [facility] and [facility] to compare costs.    I look forward to working with you again.    Take care,  Pilar GrammesAnna Astashchanka, MD

## 2018-04-14 NOTE — Progress Notes (Signed)
Chief Complaint   Patient presents with   . Medication Review     SUBJECTIVE::Haley Barrett is a 37 year old female here for acute visit.    Ms. Haley Barrett presents for her lower back pain. She last visited 7/25 with Dr. Vickii Chafe and Dr. Ileene Rubens, at which time she was given a ketorolac IM injection as well as a tramadol prescription. Patient got severely ill and overly sedated with tramadol and was given a short course of Norco 5-325 (10 pills.)    Since that time, Ms. Haley Barrett reports taking the Norco with improvement in her pain. She's been taking about 1/2 a pill and reports a significant reduction in pain, from 10-> 3-4. The ketorolac injection helped for multiple days. She continues to do physical therapy 2x a week, although she has had difficulty with performing exercises due to the severe pain. She continues to utilize topical lidocaine patches, taking ibuprofen around the clock, and intermittently using flexeril with minimal improvement. She is not able to play volleyball or exercise. She is having issues with [facility] approving steroid shots.     Patient very upset over the limitation of her exercise and the severity of her pain. She reports shooting pain, particularly over L side, and constant soreness of the lower back.     ROS:    Constitutional: negative for fever, chills, weight loss or weight gain  CV: negative for chest pain, palpitations, LEE  Respiratory: negative for SOB, DOE, cough  GI: negative for nausea, vomiting, abdominal pain    Patient Active Problem List   Diagnosis   . ADD (attention deficit disorder)   . Birth control   . Finger dislocation, subsequent encounter   . Chronic bilateral low back pain, with sciatica presence unspecified   . Lumbar spondylosis     Past medical history and family history reviewed and updated today as below.    Allergies and medications reviewed and updated as below.    Past Medical History:   Diagnosis Date   . ADD (attention deficit disorder)    . Clavicular fracture        Social History     Tobacco Use   . Smoking status: Never Smoker   . Smokeless tobacco: Never Used   Substance Use Topics   . Alcohol use: Yes     Comment: socially, couple drinks/week    . Drug use: Yes     Types: Marijuana     Comment: rare marijuana     Social History     Social History Narrative    Single    Psychotherapist - just beginning, in [employer]. Went to school in [place].    Born in [place] and moved to [place] 6 years ago    Tour guide-food and history tours     Hobbies: beach volleyball, horse back riding, exercise      Allergies   Allergen Reactions   . Tramadol Nausea and Vomiting     Current Outpatient Medications   Medication Sig   . Amphetamine-Dextroamphetamine (ADDERALL PO) as needed.   . cyclobenzaprine (FLEXERIL) 10 MG tablet Take 1 tablet (10 mg) by mouth every 8 hours as needed for Muscle Spasms.   . diclofenac (VOLTAREN) 1 % gel Apply 3 g topically 4 times daily.   . drospirenone-ethinyl estradiol (YAZ) 3-0.02 MG tablet Take 1 tablet by mouth daily.   Marland Kitchen HYDROcodone-acetaminophen (NORCO) 5-325 MG tablet Take 1 tablet by mouth every 8 hours as needed for Moderate Pain (  Pain Score 4-6).   Marland Kitchen. ketorolac (TORADOL) 30 MG/ML injection 1 mL (30 mg) by IntraMUSCULAR route once for 1 dose.   . lidocaine (LIDODERM) 5 % patch Apply 1 patch topically every 24 hours. Leave patch on for 12 hours, then remove for 12 hours.   . naproxen (NAPROSYN) 500 MG tablet Take 1 tablet (500 mg) by mouth 2 times daily as needed (pain). Take with meals.   Marland Kitchen. zolpidem (AMBIEN) 10 MG tablet Take 1 tablet (10 mg) by mouth nightly as needed for Insomnia.     No current facility-administered medications for this visit.      OBJECTIVE:: BP 125/77 (BP Location: Right arm, BP Patient Position: Sitting, BP cuff size: Regular)   Pulse 68   Temp 98.1 F (36.7 C) (Temporal)   Ht 5\' 10"  (1.778 m)   Wt 76.3 kg (168 lb 3.2 oz)   SpO2 98%   BMI 24.13 kg/m    Gen: alert, uncomfortable and intermittently tearful    ENMT: OP clear without erythema or exudates  Heart:  RRR, no murmurs, rubs, or gallups  Lungs: clear to auscultation, normal respiratory effort  Abdomen: soft, non-tender.  Normoactive bowel sounds.  No masses or hepatosplenomegaly.  M/S:  Significant pain with movement. No spinal/paraspinal tenderness. No edema.      Labs and chart reviewed.    A/P:  [name] was seen today for medication review.    Diagnoses and all orders for this visit:    #Chronic low back pain with sciatica, sciatica laterality unspecified, unspecified back pain laterality - patient without improving pain. Seen by ortho and attempting multi-modal pain control methods. Will do short bridge of Norco for the next few weeks while intraarticular glucocorticoid shots get approved. Discuss potential size effects of opiates, including sedation, constipation, and hyperalgesia with chronic use. Emphasized importance of continued topical as well as anti-inflammatory medication use.   -     ketorolac (TORADOL) 30 MG/ML injection; 1 mL (30 mg) by IntraMUSCULAR route once for 1 dose.  -     HYDROcodone-acetaminophen (NORCO) 5-325 MG tablet; Take 1 tablet by mouth every 8 hours as needed for Moderate Pain (Pain Score 4-6).  -     ketorolac (TORADOL) injection 30 mg    Follow-up prn.  Patient verbalizes understanding and is agreeable to above plan.

## 2018-04-14 NOTE — Progress Notes (Signed)
Attending Note:  Chief Complaint   Patient presents with   . Medication Review      Subjective:  I reviewed the history.  Patient interviewed and examined.   Jackelyn HoehnMegan Lapenna is a 37 year old female is here for FU of severe lumbar radiculopathy.  LCV, did get IM toradol with great relief for a few days.  Did not like the medrol dosepak.   She is doing PT, and trying to get approved facet injections.  Rest as hx per the resident.  Review of Systems - 12 step ROS reviewed, and negative except as noted in HPI and PMHx.    Past Medical, Family, Social History:  As per  the resident's  Note and EPIC.  Past Medical History:   Diagnosis Date   . ADD (attention deficit disorder)    . Clavicular fracture      Objective:   I have examined the patient and I concur with the resident's exam.  Recent labs and imaging as well as other data reviewed.    Assessment/Plan:    Assessment and plan reviewed with the resident physician.  I agree with the resident's plan as documented.    See the resident's note for further details.      Follow up as per resident.    Patient Instruction:  See Patient Education/Instruction section.      Barriers to Learning assessed: none. All of the patients questions were answered to patient's satisfaction. Patient verbalizes understanding of teaching and instructions and is agreeable to above plan.

## 2018-04-14 NOTE — Interdisciplinary (Signed)
Toradol administered.  Patient confirmed allergies,  name and date of birth.  Patient tolerated very well.  Patient educated on s/s of allergic reaction.  Patient advised to wait in clinic lobby 15 mins for s/s of adverse reaction and alert clinic staff immediately.  Patient states has received this injection before with no adverse reactions/able to drive ok and unable to wait due to being late for another appointment.   Patient advised to call with any questions/concerns.  Patient verbalize good understanding.

## 2018-04-14 NOTE — Telephone Encounter (Signed)
Re-routing to resident pool for f/u.

## 2018-04-14 NOTE — Telephone Encounter (Signed)
Request is for norco. Filled by Dr. Ileene RubensSani today. Closing.

## 2018-04-15 ENCOUNTER — Ambulatory Visit (HOSPITAL_BASED_OUTPATIENT_CLINIC_OR_DEPARTMENT_OTHER): Payer: Medicaid Other | Admitting: Rehabilitative and Restorative Service Providers"

## 2018-04-15 DIAGNOSIS — M549 Dorsalgia, unspecified: Principal | ICD-10-CM

## 2018-04-15 DIAGNOSIS — G8929 Other chronic pain: Secondary | ICD-10-CM

## 2018-04-15 DIAGNOSIS — M545 Low back pain: Secondary | ICD-10-CM

## 2018-04-15 DIAGNOSIS — M25552 Pain in left hip: Secondary | ICD-10-CM

## 2018-04-15 NOTE — Interdisciplinary (Signed)
Physical Therapy Daily Follow up Note    Referring Physician: Iona Beard           Preferred Language:English    Patient to continue therapy for     ICD-10-CM ICD-9-CM    1. Back pain, unspecified back location, unspecified back pain laterality, unspecified chronicity M54.9 724.5    2. Chronic bilateral low back pain without sciatica M54.5 724.2     G89.29 338.29    3. Left hip pain M25.552 719.45        SUBJECTIVE AND OBJECTIVE     Row Name 04/15/18 1400          Patient stated Goal    Patient stated goal  pain free return to sport beach volley ball     Row Name 04/15/18 1400          Subjective    Status since last treatment   Pt reports her pain has been controlled with pain injection and norco refill. She is having trouble with recruitment of abs.      Row Name 04/15/18 1400          Pain Assessment    Pain Asssessment Tool  Numeric Pain Rating Scale     Row Name 04/15/18 1400          Numeric Pain Rating Scale     Pain Intensity - rating at present  0     Pain Intensity - rating at worst   4     Frequency   Constant     Description   Dull;Aching;Sharp;Radiating     Pain Description  premedicated           knee drive with reversal-pain  Knee drive with reversal with push on UE -pain  Compression of pelvis knee drive with reversal-no pain                ASSESSMENT AND PLAN     Row Name 04/15/18 1400          Assessment     Assessment   Patient responded well to treatment today. Co-contraction closed chain of UE utilized to activate core was advantageous as patient has notable neuromuscular deficit of TA activation.  Functional exercises performed in standing as key limitation is in weightbearing. SIJ belt recommended. Advised to bring in it for next visit. Patient to reduce frequency of physical therapy to 1x week to increase carry over.      Rehab Potential  Excellent     Row Name 04/15/18 1400          Focus for Next Treatment    Focus for Next Treatment  Therapeutic exercise;Therapeutic  activities;Neuromuscular re-education;Manual therapy     Row Name 04/15/18 1400          Patient/Family Education    Learner(s)  Patient     Patient/family training in appropriate therapeutic interventions  Ongoing     Education Topic(s)  Mobility;Exercise Program     Row Name 04/15/18 1400          MediCal Treatment completed today    MediCal Treatment initial 30 minutes [phone])  Completed     Row Name 04/15/18 1400          MediCAL Treatment today    Therapeutic exercise    Core stabilization exercises       Therapeutic exercise    pelvic tilt 2x10; pelvic tilt march 2x10; swiss ball curl with iso shoulder extension 3x10; swiss ball curl trunk  rotation for core activation 2x10     Row Name 04/15/18 1400          Treatment Time     Total TIMED Treatment  (min)  30     Total Treatment Time (min)  30

## 2018-04-18 ENCOUNTER — Ambulatory Visit (HOSPITAL_BASED_OUTPATIENT_CLINIC_OR_DEPARTMENT_OTHER): Payer: Medicaid Other | Admitting: Rehabilitative and Restorative Service Providers"

## 2018-04-18 ENCOUNTER — Encounter (HOSPITAL_BASED_OUTPATIENT_CLINIC_OR_DEPARTMENT_OTHER): Payer: Self-pay | Admitting: Orthopaedic Surgery

## 2018-04-18 NOTE — Progress Notes (Signed)
Ongoing difficulties reaching [employer] insurance peer-to-peer reviewers. They are not in the office today (Monday 04-18-18) in order to conduct business.    Per communications, they require further documentation to evidence participation with physical therapy. They want documentation of 12 PT sessions over 8 weeks. She has surpassed this requirement with many more PT sessions this past year and a half.    I saw the patient for the first time 01-28-18. At that time, she had two years of pain and 18 months of physical therapy at [facility]. [place] Epic notes of PT [facility](Audrine Yu) show multiple visits dated from 07-09-17 to 01-03-18 (6 months). Prior to that [employer] [facility] notes of PT (Noe Madrigal/John Larios) show multiple visits each month from to 08-21-16 to 02-16-17 (7 months). Prior to that [employer] Epic notes of PT (John Alarios/Noe Madrigal) show visits from to 04-10-16 to  05-25-16 (6 weeks).    The patient has additionally done PT at  with Judie PetitM. Jonathon Jordanbispo from 04-05-2018 to 04-14-2018.    We can FAX PT notes for review if so desired.

## 2018-04-21 ENCOUNTER — Encounter (HOSPITAL_BASED_OUTPATIENT_CLINIC_OR_DEPARTMENT_OTHER): Payer: Self-pay | Admitting: Orthopaedic Surgery

## 2018-04-21 ENCOUNTER — Ambulatory Visit (HOSPITAL_BASED_OUTPATIENT_CLINIC_OR_DEPARTMENT_OTHER): Payer: Medicaid Other | Admitting: Rehabilitative and Restorative Service Providers"

## 2018-04-21 DIAGNOSIS — M545 Low back pain: Secondary | ICD-10-CM

## 2018-04-21 DIAGNOSIS — M549 Dorsalgia, unspecified: Principal | ICD-10-CM

## 2018-04-21 DIAGNOSIS — G8929 Other chronic pain: Secondary | ICD-10-CM

## 2018-04-21 NOTE — Progress Notes (Signed)
Spoke with peer to peer regarding PT as documented in epic notes, approval obtained

## 2018-04-21 NOTE — Telephone Encounter (Signed)
After peer to peer discussion request for injection overturned & approved. Updated auth/cert. Routing to [facility] schedulers to call patient for an appt. Thank you!

## 2018-04-21 NOTE — Interdisciplinary (Signed)
Physical Therapy Daily Follow up Note    Referring Physician: Iona BeardSani, Armelia           Preferred Language:English    Patient to continue therapy for     ICD-10-CM ICD-9-CM    1. Back pain, unspecified back location, unspecified back pain laterality, unspecified chronicity M54.9 724.5    2. Chronic bilateral low back pain without sciatica M54.5 724.2     G89.29 338.29        SUBJECTIVE AND OBJECTIVE     Row Name 04/21/18 1100          Patient stated Goal    Patient stated goal  pain free return to sport beach volley ball     Row Name 04/21/18 1100          Subjective    Status since last treatment   Pt reports that she is ok today. Walking a little better but feels like she is being stabbed by ice picks.      Row Name 04/21/18 1100          Pain Assessment    Pain Asssessment Tool  Numeric Pain Rating Scale     Row Name 04/21/18 1100          Numeric Pain Rating Scale     Pain Intensity - rating at present  0     Pain Intensity - rating at worst   7     Frequency   Constant     Description   Dull;Aching;Sharp;Radiating     Pain Description  premedicated                                     ASSESSMENT AND PLAN     Row Name 04/21/18 1100          Assessment     Assessment   Patient responded well to trunk stabilization with program.  Patient displayed better efficient activation with SIJ belt. Patient advised to use it more for walking. If tolerated.  Patient's pain reaction was heightened initially with start of all exercises but with education of of pain response, patient was more at ease with progression and pain reduced with continuation of repetitions. Cueing need to ensure TA trunk activation. Progress as tolerated.      Rehab Potential  Excellent     Row Name 04/21/18 1100          Focus for Next Treatment    Focus for Next Treatment  Therapeutic exercise;Therapeutic activities;Neuromuscular re-education;Manual therapy     Row Name 04/21/18 1100          Patient/Family Education    Learner(s)   Patient     Patient/family training in appropriate therapeutic interventions  Ongoing     Education Topic(s)  Mobility;Exercise Program     Row Name 04/21/18 1100          MediCal Treatment completed today    MediCal Treatment initial 30 minutes [phone])  Completed     Row Name 04/21/18 1100          MediCAL Treatment today    Therapeutic exercise    Core stabilization exercises       Therapeutic exercise        Row Name 04/21/18 1100 pelvic tilt 2x10; swiss ball curl 3x10; pallof press with swiss ball 2x10 blue TB; bridging with band 2x10; swiss ball curl trunk rotation for  core activation 3x10; clams blue TB x10 no band x10; standing march iso shoulder extension at bar 4x8 use SIJ belt; leg press 3x10 3 bands use SIJ belt            Treatment Time     Total TIMED Treatment  (min)  30     Total Treatment Time (min)  30

## 2018-04-22 ENCOUNTER — Other Ambulatory Visit (HOSPITAL_BASED_OUTPATIENT_CLINIC_OR_DEPARTMENT_OTHER): Payer: Self-pay | Admitting: Orthopaedic Surgery

## 2018-04-22 DIAGNOSIS — R52 Pain, unspecified: Principal | ICD-10-CM

## 2018-04-25 ENCOUNTER — Ambulatory Visit: Payer: Medicaid Other

## 2018-04-25 ENCOUNTER — Ambulatory Visit
Admission: RE | Admit: 2018-04-25 | Discharge: 2018-04-25 | Disposition: A | Discharge status: Home / Self Care | Payer: Medicaid Other | Attending: Orthopaedic Surgery | Admitting: Orthopaedic Surgery

## 2018-04-25 ENCOUNTER — Encounter (HOSPITAL_BASED_OUTPATIENT_CLINIC_OR_DEPARTMENT_OTHER): Payer: Self-pay | Admitting: Hospital

## 2018-04-25 ENCOUNTER — Encounter (HOSPITAL_BASED_OUTPATIENT_CLINIC_OR_DEPARTMENT_OTHER): Admission: RE | Disposition: A | Discharge status: Home Health | Payer: Self-pay | Attending: Orthopaedic Surgery

## 2018-04-25 DIAGNOSIS — R52 Pain, unspecified: Secondary | ICD-10-CM

## 2018-04-25 DIAGNOSIS — M47817 Spondylosis without myelopathy or radiculopathy, lumbosacral region: Principal | ICD-10-CM | POA: Insufficient documentation

## 2018-04-25 DIAGNOSIS — F988 Other specified behavioral and emotional disorders with onset usually occurring in childhood and adolescence: Secondary | ICD-10-CM | POA: Insufficient documentation

## 2018-04-25 DIAGNOSIS — Z885 Allergy status to narcotic agent status: Secondary | ICD-10-CM | POA: Insufficient documentation

## 2018-04-25 DIAGNOSIS — M47816 Spondylosis without myelopathy or radiculopathy, lumbar region: Secondary | ICD-10-CM

## 2018-04-25 SURGERY — PAIN INJECTION, PARAVERT FACET LUMBAR OR SACRAL 1ST
Anesthesia: Local | Laterality: Bilateral

## 2018-04-25 MED ORDER — TRIAMCINOLONE ACETONIDE 40 MG/ML IJ SUSP
INTRAMUSCULAR | Status: DC
Start: 2018-04-25 — End: 2018-04-25
  Filled 2018-04-25: qty 1

## 2018-04-25 MED ORDER — BUPIVACAINE HCL (PF) 0.5 % IJ SOLN
INTRAMUSCULAR | Status: DC
Start: 2018-04-25 — End: 2018-04-25
  Filled 2018-04-25: qty 10

## 2018-04-25 MED ORDER — BUPIVACAINE HCL (PF) 0.5 % IJ SOLN
INTRAMUSCULAR | Status: DC
Start: 2018-04-25 — End: 2018-04-25
  Filled 2018-04-25: qty 30

## 2018-04-25 SURGICAL SUPPLY — 8 items
APPLICATOR CHLORAPREP 3ML, CLEAR (Misc Medical Supply) ×3 IMPLANT
GLOVE BIOGEL PI ULTRATOUCH SIZE 7 (Gloves/gowns) IMPLANT
GLOVE BIOGEL PI ULTRATOUCH SIZE 7.5 (Gloves/gowns) IMPLANT
GLOVE BIOGEL PI ULTRATOUCH SIZE 8 (Gloves/gowns)
MARKER SECURELINE SURG SKIN (Misc Medical Supply) ×3 IMPLANT
NEEDLE SPINE QUINCKE 23G X 3.5" (Needles/punch/cannula/biopsy) IMPLANT
NEEDLE SPINE QUINCKE 25G X 3.5" (Needles/punch/cannula/biopsy) IMPLANT
TRAY SINGLE SHOT EPIDURAL (Procedure Packs/kits) ×3 IMPLANT

## 2018-04-25 NOTE — Discharge Instructions (Signed)
Follow up with Dr Cherly Hensenhang    Patient verbalized understanding of below instructions    Procedure Done Today: bilateral lumbar 4/5  & Lumbar 5/sacral1 medial branch block    Post-Procedure Instructions   Take it easy for several hours after the injection until you are confident that you are not having any side effects.  You may shower today.  Do not submerge the injection site in a pool, bath, hot-tub, Jacuzzi, etc. for 24 hours after the injection.  There may be some soreness at the injection site.  Local application of ice may help to reduce soreness, and also you may take Tylenol for any local discomfort.  Patients may return to their normal activities the day after the procedure, including returning to work.  The steroids begin their maximal effect about 24-72 hours after the injection.  Overall most patients experience a noticeable improvement in their pain and quality of life, over the ensuing weeks to months.     Medication Side Effects   Temporary facial flushing with hot flashes, local whitening of the skin at the injection site, and in women trace menstrual bleeding are some possible side effects but very rare.     Injection Complications   A post-injection headache that is worse with sitting/standing up is a rare possibility. If this occurs, spend most of your time lying flat and drink plenty of fluids.    Although the procedure is generally safe and complications are rare, for a few days after injection, you should be observant for any of the following serious signs:    . Fever with temperature over 101.5 F, chills, night sweats  . New onset of severe sharp pain  . Persistent redness, swelling, discharge at the injection site  . Any new weakness or numbness in the arms or legs    These symptoms would require immediate evaluation in a hospital emergency room.  (Our service is available 24 hours per day to talk to should you have these symptoms.)    After 4:30pm Mon-Fri, or on weekends and holidays you may  call the [facility] at [phone] and ask for the Orthopedic resident physician on call to be contacted.  Otherwise, concerns and questions can be directed to the [facility] during business hours on Monday thru Friday.  [facility] of [facility]  [address], [address]  [place], [place] [id number]  Tel: [phone] Clinic & [place]  Tel: [phone] Procedure in [place]    Please record your average daily pain scores and bring  to your next spine center appointment. It will help to evaluate the treatment:   0= No Pain  10=Severe Pain    Call 911 IN CASE OF EMERGENCY

## 2018-04-25 NOTE — Op Note (Signed)
PREPROCEDURE DIAGNOSIS:  Low back pain, spondylosis without myelopathy in the lumbar spine, lumbosacral facet joint arthropathy, lumbago.  POSTPROCEDURE DIAGNOSIS:  Low back pain, spondylosis without myelopathy in the lumbar spine, lumbosacral facet joint arthropathy, lumbago.  PROCEDURE:  1) Lumbosacral facet joint injection, 2) medial branch nerve block 3) Fluoroscopy for needle guidance.  PHYSICIAN/STAFF:  Rayetta Humphrey. Antrone Walla, M.D., Ph.D.  ANESTHESIA:  Local   INDICATIONS:  pain (see EPIC notes for details). I reviewed the available electronic medical records. X-ray guided palpatory examination was done to identify painful spinal levels.    DESCRIPTION OF PROCEDURE: Bilateral L4/5 and l5/S1 Lumbosacral facet joint injections with steroid and medial branch nerve block using fluoroscopic guidance    Procedure Note:  The risks (including but not limited to infection, pain, and paralysis), benefits and alternatives were described. After questions were answered, informed consent was obtained, and the patient was placed in the prone position on a fluoroscopy table. The correct side of the procedure was marked on the patient. Physiologic monitors were attached. Xray palpatory exam was conducted to identify painful facet joint levels. The back was prepped in sterile fashion. Time out protocol was called. LOCALIZATION TIME OUT:  An additional intraoperative timeout, specifically to confirm accurate localization, was conducted by the attending physician. Documentation of localization timeout was documented in [facility].     Using xray guidance a 27G one inch needle was used to infiltrate the superficial soft tissues at each level above the facet joints with ~51ml of lidocaine 1%.    Under fluoroscopic and contact guidance, a 25G, 3.5 inch spinal needle was advanced to the right L5-S1 facet joint. This elicited pain in the patient's usual distribution.     After negative aspiration for blood, part of a mix of 1 ml Kenalog 40 and 1.5 ml  bupivicaine 0.5% was instilled slowly. The needle was retracted and guided to the paravertebral facet capsule nerve at the base of the superior articular process of S1. A similar portion of the mixture was instilled slowly. The needle was then removed and reinserted using fluoroscopic and contact guidance, advanced towards the right L4/L5 facet joint. The joint and medial branch nerve was infiltrated in a similar fashion. Then the needle was removed.    A similar process was then used with the remainder of the mixture to infiltrate the left L5/S1 and L4/L5 facet joint and medial branch nerves.    There was no evidence of procedural complications. The patient tolerated the procedure well, and was transported (due to OR protocol) to the recovery area for observation in stable condition without evidence of subjective or objective motor loss. The patient was discharged with written instructions.    Radiation dose summary information details are provided in the transmitted radiology files.      Plan of Care:  Patient advised to contact the spine center and go to the local emergency room for serious complications. Patient to follow up with the spine center in 1-2 weeks to evaluate suitability for radiofrequency ablation procedures.    Reference:  An update of the effectiveness of therapeutic lumbar facet joint interventions. [employer] FJ, GaltManchikanti L, Gila CrossingDatta S, AkronSehgal N, SligoGeffert S, RadcliffeOnyewu O, Zhu J, Coubarous S, Mount JacksonHameed M, Ward ArizonaP, [place] Creek[place]arma M, TrentonHameed H, Thomasene LotSingh V, Boswell MV. Pain Physician. 2012 Nov-Dec;15(6):E909-53. Review.

## 2018-04-25 NOTE — H&P (Signed)
Ambulatory Surgery/Invasive Procedure History and Physical    Primary Care Physician Pilar GrammesAstashchanka, Anna    Chief Complaint: No chief complaint on file.       37 year old female     Past Medical History:   Diagnosis Date   . ADD (attention deficit disorder)    . Clavicular fracture        Allergies   Allergen Reactions   . Tramadol Nausea and Vomiting       Current Facility-Administered Medications   Medication Dose Route Frequency Provider Last Rate Last Dose   . bupivacaine 0.5 % PF injection  - ADS OVERRIDE            . bupivacaine 0.5 % PF injection  - ADS OVERRIDE                I have reviewed the patient's medications.         04/25/18  1610   BP: 139/78   Pulse: 84   Resp: 14   Temp: 98.9 F (37.2 C)   SpO2: 98%         Physical Exam     Mental status: Can this patient make their own healthcare decisions?  Yes    Chest:  Breaths easily     Heart:  RRR    Abdomen:  Soft    Pain Management Needs/Options discussed.    No Advanced Directives.      Resuscitative Status:  Full Code, Full Care    Diagnosis:    ICD-10-CM ICD-9-CM    1. Lumbar spondylosis M47.816 721.3 Discharge [facility] from [facility]      Glucose (POC)      INR & Protime (POCT)      Nursing Misc Order: Verify Consent for Procedure      Oxygen Non-Protocol      Prothrombin Time, Blood Blue      VITAL SIGNS    Added automatically from request for surgery [id number]   2. Pain R52 780.96    3. Lumbar spondylosis M47.816 721.3 Discharge Patient from [facility]      Glucose (POC)      INR & Protime (POCT)      Nursing Misc Order: Verify Consent for Procedure      Oxygen Non-Protocol      Prothrombin Time, Blood Blue      VITAL SIGNS       Procedure:bilateral l4/5 and L5/S1 lumbar face and medial branch blocks using xray guidance    This is the first injection of this type.     Discussed Risks, Benefits, and Alternatives to procedure.  Questions answered.  Patient voiced understanding and wished to proceed. Consent Signed.    See  procedure note of same date.

## 2018-04-28 ENCOUNTER — Telehealth (HOSPITAL_BASED_OUTPATIENT_CLINIC_OR_DEPARTMENT_OTHER): Payer: Self-pay | Admitting: Orthopaedic Surgery

## 2018-04-28 NOTE — Telephone Encounter (Signed)
Pt calling to schedule follow up visit with Dr.chang this agent confuse with notes of plan of care   Plan of Care:  Patient advised to contact the spine center and go to the local emergency room for serious complications. Patient to follow up with the spine center in 1-2 weeks to evaluate suitability for radiofrequency ablation procedures.    Please advise should pt schedule with Dr.chang? Or where?    Pt asking for a call back.

## 2018-04-28 NOTE — Telephone Encounter (Signed)
Spoke to pt and [facility]

## 2018-04-29 ENCOUNTER — Ambulatory Visit (HOSPITAL_BASED_OUTPATIENT_CLINIC_OR_DEPARTMENT_OTHER): Payer: Medicaid Other | Admitting: Rehabilitative and Restorative Service Providers"

## 2018-05-02 ENCOUNTER — Other Ambulatory Visit: Payer: Self-pay

## 2018-05-03 ENCOUNTER — Ambulatory Visit: Payer: Medicaid Other | Attending: Physician Assistant | Admitting: Physician Assistant

## 2018-05-03 DIAGNOSIS — M47816 Spondylosis without myelopathy or radiculopathy, lumbar region: Secondary | ICD-10-CM | POA: Insufficient documentation

## 2018-05-03 DIAGNOSIS — M4696 Unspecified inflammatory spondylopathy, lumbar region: Secondary | ICD-10-CM | POA: Insufficient documentation

## 2018-05-03 DIAGNOSIS — M545 Low back pain: Secondary | ICD-10-CM | POA: Insufficient documentation

## 2018-05-03 DIAGNOSIS — G8929 Other chronic pain: Secondary | ICD-10-CM | POA: Insufficient documentation

## 2018-05-03 MED ORDER — NAPROXEN 500 MG OR TABS
500.00 mg | ORAL_TABLET | Freq: Two times a day (BID) | ORAL | 1 refills | Status: DC | PRN
Start: 2018-05-03 — End: 2018-08-15

## 2018-05-03 NOTE — Progress Notes (Signed)
ORTHOPAEDIC SPINE SURGERY     Visit Type: Office Visit    Reason For Visit: No chief complaint on file.       History Of Present Illness: 37 year old female complains of bilateral back and left leg pain. She has been having low back pain for 2 years.  She states it was mainly on the left, but feels it started becoming more prominent on the right.  She had bilateral L4-5 and L5-1 facet injections and MBB by Dr Cherly Hensenhang on 04/25/18.  She states the back pain has had some improvement since the injection, but the pain is still present.  She also c/o new symptoms of feeling "wobbly" and having a "vibrating" sensation in her left leg since the injection.  This is her first spine injection, but states she has had other steroid injections in her skin that she did not cause any abnormal reaction.  She has been on naproxen.  Patient reports no changes in bowel and bladder function. Patient returns for follow up.    Allergies:   Allergies   Allergen Reactions   . Tramadol Nausea and Vomiting       Medications:   Current Outpatient Medications   Medication Sig   . Amphetamine-Dextroamphetamine (ADDERALL PO) as needed.   . cyclobenzaprine (FLEXERIL) 10 MG tablet Take 1 tablet (10 mg) by mouth every 8 hours as needed for Muscle Spasms.   . diclofenac (VOLTAREN) 1 % gel Apply 3 g topically 4 times daily.   . drospirenone-ethinyl estradiol (YAZ) 3-0.02 MG tablet Take 1 tablet by mouth daily.   Marland Kitchen. HYDROcodone-acetaminophen (NORCO) 5-325 MG tablet Take 1 tablet by mouth every 8 hours as needed for Moderate Pain (Pain Score 4-6).   Marland Kitchen. lidocaine (LIDODERM) 5 % patch Apply 1 patch topically every 24 hours. Leave patch on for 12 hours, then remove for 12 hours.   . naproxen (NAPROSYN) 500 MG tablet Take 1 tablet (500 mg) by mouth 2 times daily as needed (pain). Take with meals.   Marland Kitchen. zolpidem (AMBIEN) 10 MG tablet Take 1 tablet (10 mg) by mouth nightly as needed for Insomnia.     No current facility-administered medications for this visit.           Review of Systems:  CONSTITUTIONAL: No unexpected weight loss, fevers, chills, or anorexia. SKIN: Noncontributory. GENITOURINARY: Symptoms as noted above in HPI. NEUROLOGIC: Symptoms as noted above in HPI. MUSCULOSKELETAL: Symptoms as noted above in HPI.    Physical Examination:  There were no vitals taken for this visit.  CONSTITUTIONAL: Well appearing and well groomed. PSYCHOLOGICAL: Alert and oriented and appropriate to situation. INTEGUMENT: Intact. VASCULAR: Radial and pedal pulses are intact and symmetric. EXTREMITIES: Bilateral upper and lower extremities have good range of motion and no significant deformities. GAIT: Brisk with good coordination.     LUMBAR SPINE  +Tender to percussion. Heel to toe walk without deficit. Sensation intact of the lower extremities.  Skin intact.  Slight bruising from injection site, but no erythema or significant swelling.    RANGE OF MOTION        RT  LT  lateral bend, normal at 25 degrees 25  25  rotation, normal at 30 degrees 30  30  Flexion - normal at 40 degrees. Extension - normal at 10 degrees.    MOTOR STRENGTH       RT  LT  Iliopsoas   4+/5  4+/5  Quadricep   5/5  5/5  Anterior tibialis  5/5  5/5  Extensor hallucis longus 5/5  5/5  Gastrocsoleus   5/5  5/5    DEEP TENDON REFLEXES       RT  LT  Patellar   2+  2+  Achilles   2+  2+    PATHOLOGIC REFLEXES      RT LT  Clonus    Neg Neg    STRAIGHT LEG RAISING       RT  LT  Sitting @ 90 degrees   Neg Neg    IMAGING STUDIES: None today    Outside lumbar MRI from 03/04/18 shows mild disc bulge at L4-5 with mild left L4-5 foraminal stenosis.  Bilateral facet arthropathy at L4-5.    OTHER MEDICAL RECORDS/IMAGING STUDIES REVIEWED: None    IMPRESSION:   Encounter Diagnoses   Name Primary?   . Lumbar spondylosis Yes   . Chronic bilateral low back pain, with sciatica presence unspecified    . Facet arthritis of lumbar region (CMS-HCC)         PLAN/DISCUSSION: The plan is for NSAIDs for now to reduce her infammation. Unsure if  her new symptoms are a reaction to the steroid injection.  Per Dr Cherly Hensen, he will f/u with patient at [facility] on 05/13/18.  Pt requests to meet with a surgeon as well for another opinion and would like to meet Dr Zlomislic.  Referral placed.  All questions were answered and Haley Barrett understood and was satisfied with this plan.     FOLLOWUP: Return to clinic: prn. The patient is encouraged to call us with any questions or problems in the interim. Our contact numbers were given to the patient.      Talitha Givens, PA-C  Orthopaedic Surgery Physician Assistant  Supervising Physician - R Shyrl Numbers, MD, PhD

## 2018-05-05 ENCOUNTER — Ambulatory Visit (HOSPITAL_BASED_OUTPATIENT_CLINIC_OR_DEPARTMENT_OTHER): Payer: Medicaid Other | Admitting: Rehabilitative and Restorative Service Providers"

## 2018-05-08 ENCOUNTER — Other Ambulatory Visit: Payer: Self-pay

## 2018-05-10 ENCOUNTER — Ambulatory Visit (HOSPITAL_BASED_OUTPATIENT_CLINIC_OR_DEPARTMENT_OTHER): Payer: Medicaid Other | Admitting: Rehabilitative and Restorative Service Providers"

## 2018-05-10 ENCOUNTER — Emergency Department
Admission: EM | Admit: 2018-05-10 | Discharge: 2018-05-10 | Disposition: A | Discharge status: Home / Self Care | Payer: Medicaid Other | Source: Ambulatory Visit | Attending: Emergency Medicine | Admitting: Emergency Medicine

## 2018-05-10 DIAGNOSIS — M5442 Lumbago with sciatica, left side: Secondary | ICD-10-CM

## 2018-05-10 DIAGNOSIS — F988 Other specified behavioral and emotional disorders with onset usually occurring in childhood and adolescence: Secondary | ICD-10-CM | POA: Insufficient documentation

## 2018-05-10 DIAGNOSIS — G8929 Other chronic pain: Secondary | ICD-10-CM | POA: Insufficient documentation

## 2018-05-10 DIAGNOSIS — M549 Dorsalgia, unspecified: Secondary | ICD-10-CM | POA: Insufficient documentation

## 2018-05-10 MED ORDER — HYDROMORPHONE HCL 1 MG/ML IJ SOLN
1.00 mg | Freq: Once | INTRAMUSCULAR | Status: AC
Start: 2018-05-10 — End: 2018-05-10
  Administered 2018-05-10: 1 mg via INTRAMUSCULAR
  Filled 2018-05-10: qty 1

## 2018-05-10 MED ORDER — KETOROLAC TROMETHAMINE 30 MG/ML IJ SOLN
15.00 mg | Freq: Once | INTRAMUSCULAR | Status: DC
Start: 2018-05-10 — End: 2018-05-11
  Filled 2018-05-10: qty 1

## 2018-05-10 MED ORDER — PROCHLORPERAZINE MALEATE 10 MG OR TABS
5.00 mg | ORAL_TABLET | Freq: Once | ORAL | Status: AC
Start: 2018-05-10 — End: 2018-05-10
  Administered 2018-05-10: 5 mg via ORAL
  Filled 2018-05-10: qty 1

## 2018-05-10 MED ORDER — OXYCODONE-ACETAMINOPHEN 5-325 MG OR TABS
1.00 | ORAL_TABLET | Freq: Once | ORAL | Status: AC
Start: 2018-05-10 — End: 2018-05-10
  Administered 2018-05-10: 1 via ORAL
  Filled 2018-05-10: qty 1

## 2018-05-10 MED ORDER — ONDANSETRON 8 MG OR TBDP
8.00 mg | ORAL_TABLET | Freq: Three times a day (TID) | ORAL | 0 refills | Status: DC | PRN
Start: 2018-05-10 — End: 2018-06-02

## 2018-05-10 MED ORDER — OXYCODONE-ACETAMINOPHEN 5-325 MG OR TABS
1.00 | ORAL_TABLET | Freq: Four times a day (QID) | ORAL | 0 refills | Status: DC | PRN
Start: 2018-05-10 — End: 2018-05-17

## 2018-05-10 MED ORDER — ONDANSETRON 4 MG OR TBDP
8.00 mg | ORAL_TABLET | Freq: Once | ORAL | Status: AC
Start: 2018-05-10 — End: 2018-05-10
  Administered 2018-05-10 (×2): 8 mg via ORAL
  Filled 2018-05-10: qty 2

## 2018-05-10 MED ORDER — LIDOCAINE 5 % EX PTCH
1.00 | MEDICATED_PATCH | Freq: Once | CUTANEOUS | Status: DC
Start: 2018-05-10 — End: 2018-05-11
  Administered 2018-05-10 (×2): 1 via TRANSDERMAL
  Filled 2018-05-10: qty 1

## 2018-05-10 MED ORDER — ONDANSETRON 4 MG OR TBDP
8.00 mg | ORAL_TABLET | Freq: Once | ORAL | Status: AC
Start: 2018-05-10 — End: 2018-05-10
  Administered 2018-05-10: 8 mg via ORAL
  Filled 2018-05-10: qty 2

## 2018-05-10 NOTE — ED Notes (Addendum)
Dr. Beaulah Corin aware that pt declines toradol

## 2018-05-10 NOTE — ED Notes (Signed)
Dr. Coyne at bedside for reevaluation

## 2018-05-10 NOTE — Discharge Instructions (Signed)
Chronic Back Pain with Intractable Pain    You have been seen for an exacerbation (worsening) of your chronic back pain.    Back pain can occur from the neck down to the lower back. Back pain has many different causes. Some of the more common are bone pain, muscle spasm, pain from overuse, and pinched nerves. Chronic back pain is pain that lasts for months or years due to a long lasting, underlying problem or injury. Your doctor will try to treat the underlying cause to relieve your symptoms. Treatment of chronic back pain can be difficult and has many treatment options. Some of these therapies include physical therapy, medicine, acupuncture, diet changes, counseling, and surgical intervention.    While the emergency department and hospital are available for emergencies related to your painful condition, you need to see your regular doctor for the ongoing care of your pain.    Some things you can try to make your back pain feel better, include:     Apply a warm damp washcloth to the back where you have pain for 20 minutes at a time, at least 4 times per day.    Have someone massage the sore parts of your back.     Don't do any heavy lifting or bending. You can go back to normal daily activities if they don't make the pain worse.   You can use anti-inflammatory pain medicine for your pain. This could be Ibuprofen (Advil or Motrin). You can buy these at most stores. Follow the directions on the package.     If you have not seen a pain specialist, ask the physician to give you some information on who to contact.     Keep a back pain diary. When you experience acute pain episodes record what symptoms you are having, what time they occurred, and what you were doing when it occurred. This information can help your doctor or pain specialist modify treatment to your needs.     Stress Management: It is well known that increased stress in your life can exacerbate your chronic pain. Talk with your doctor to  learn how to deal with stressors in your life in a healthy way.      Weight Loss: Excess weight can make chronic pain worse, especially if your pain is in your muscles or joints. Talk with your doctor about starting a healthy weight loss program.     Please do not drive or operate heavy machinery if you are taking narcotic pain medications.      YOU MUST PLAN FOR PAIN EXACERBATIONS IN ADVANCE! If you think you are running low on your pain medications, it is your responsibility to plan in advance and get a refill from your doctor. You should NEVER wait until a weekend to try to get in contact with your doctor if you are running out of pain medicine. Emergency /Urgent Care Physicians will usually only provide a single dose of pain medicine and you may not have enough for the rest of the weekend.     Close follow-up care with your family doctor or pain specialist is important. You may need further testing to determine the underlying cause. If the medical staff has not already contacted your doctors for you, make sure you tell your doctor about this hospital stay.    Continue your previous medications unless otherwise directed.    YOU SHOULD SEEK MEDICAL ATTENTION IMMEDIATELY, EITHER HERE OR AT THE NEAREST EMERGENCY DEPARTMENT, IF ANY OF THE FOLLOWING OCCURS:  You develop a fever (temperature higher than 100.4°F or 38°C) or chills.  · You develop any significant change in your back pain pattern.   · You have pain in other areas than your back, such as you abdomen (belly) or your chest  · Your arms or legs tingle or feel numb (lose feeling).  · Your arms or legs are weak.  · You lose control of your bladder or bowels. If this were to happen, it may cause you to wet or soil yourself.   · You feel weak or your symptoms do not improve as expected.     If you can't follow up with your doctor, or if at any time you feel you need to be rechecked or seen again, come back here or go to the nearest emergency  department.

## 2018-05-10 NOTE — ED Notes (Signed)
Nausea improved at this time   Friend pulling the car around front of the ER and states feels ready to go

## 2018-05-10 NOTE — ED Notes (Addendum)
Vitals stable and unchanged  Pt states pain is 6/10  Went over DC instructions with pt  Pt verbalized understanding  Went over med info/edu  Pt verbalized understanding  Provided with [facility]  NO PIV TO DC  Pt dressed and attempted for DC with  Friend and pt began vomiting on way to WR   States very dizzy with position changes and movement s/p pain meds   Pt brought back into ER  Notified MD Beaulah Corin

## 2018-05-11 NOTE — Telephone Encounter (Signed)
Patient calling back same issue asking to please work her in on Dr Anner Crete schedule this Friday 05/13/18 was in the ER yesterday her pain level 5-6. Or asking if she can see Dr Herma Carson for surgery eval, please advise her number [phone].    Treating Physician:Dr Chang       LOV notes/plan: PLAN/DISCUSSION: The plan is for NSAIDs for now to reduce her infammation. Unsure if her new symptoms are a reaction to the steroid injection.  Per Dr Cherly Hensen, he will f/u with patient at [facility] on 05/13/18.  Pt requests to meet with a surgeon as well for another opinion and would like to meet Dr Zlomislic.  Referral placed.  All questions were answered and Kherington Esker understood and was satisfied with this plan.     FOLLOWUP: Return to clinic: prn. The patient is encouraged to call us with any questions or problems in the interim. Our contact numbers were given to the patient.      Talitha Givens, PA-C  Orthopaedic Surgery Physician Assistant  Supervising Physician - R Shyrl Numbers, MD, PhD        Best number to be reached at:            Route to RN pool if requesting clinical advice, route to MA if administrative advice schedule for this Friday 05/13/18

## 2018-05-11 NOTE — Telephone Encounter (Signed)
Spoke to pt: She continues with LBP and has had tingling to the vagina area. She went to the ED yesterday. I have made f/u appt for Dr. Cherly Hensen on 05/16/18 and Dr. Herma Carson on 05/23/18.

## 2018-05-11 NOTE — Interdisciplinary (Signed)
Patient came for appointment today but was in 10/10 pain in tears and couldn't tolerate manual therapy, stretching,or any position without exaggerated pain.   She reported feeling as if she was going to pass out from pain and couldn't walk. Patient reported signs of tingling sensation in pelvic/genital region, pain in anus, and progressive pain shooting down her leg. Suspicion of cauda equina symptoms. Patient elected to go ER as pain was unrelenting. EMS protocol followed.

## 2018-05-12 ENCOUNTER — Encounter (HOSPITAL_BASED_OUTPATIENT_CLINIC_OR_DEPARTMENT_OTHER): Payer: Self-pay | Admitting: Orthopaedic Surgery

## 2018-05-12 ENCOUNTER — Telehealth (HOSPITAL_BASED_OUTPATIENT_CLINIC_OR_DEPARTMENT_OTHER): Payer: Self-pay | Admitting: Orthopaedic Surgery

## 2018-05-12 ENCOUNTER — Other Ambulatory Visit: Payer: Self-pay

## 2018-05-12 ENCOUNTER — Encounter (HOSPITAL_BASED_OUTPATIENT_CLINIC_OR_DEPARTMENT_OTHER): Payer: Self-pay | Admitting: Orthopaedic Surgery of the Spine

## 2018-05-12 NOTE — Telephone Encounter (Signed)
Will send to Dr. Cherly Hensen. She will have appt with Dr. Cherly Hensen on 9/9.

## 2018-05-12 NOTE — Telephone Encounter (Signed)
Please encourage patient to continue following up with her PCP/Pain management doctor for her prescription pain medication needs.

## 2018-05-12 NOTE — Telephone Encounter (Signed)
Medication: oxyCODONE-acetaminophen (PERCOCET) 5-325 MG tablet (Order# [id number])     Pt is calling in because she states the pain comes in waves and she is in a lot of pain, she is anxious about getting the medication before it runs out , she is in severe pain, please also call and advice                           Date of last refill: 05/10/18    Name of original prescribing physician:  Farley Ly, MD    LOV notes and plan:05/03/2018  PLAN/DISCUSSION: The plan is for NSAIDs for now to reduce her infammation. Unsure if her new symptoms are a reaction to the steroid injection.  Per Dr Cherly Hensen, he will f/u with patient at [facility] on 05/13/18.  Pt requests to meet with a surgeon as well for another opinion and would like to meet Dr Zlomislic.  Referral placed.  All questions were answered and Haley Barrett understood and was satisfied with this plan.     FOLLOWUP: Return to clinic: prn. The patient is encouraged to call us with any questions or problems in the interim. Our contact numbers were given to the patient.      Preferred Pharmacy for e-prescription:    [id number] - [place], [place] - [address] [place] [address]    Preferred pick-up location (please let them know that this refill may be picked up at a different location, depending on provider location, if patient can only pick up at one location note this and let them know next time provider is in that office, document this)      Please allow up to up to 72 hours for our office to process your medications request    Please verify any new medication allergies with patient, Please review chart to see notes from last refill request if patient has obtained previous refills of same medication.       Department Policy: We are only able to prescribe medications for patients that are current established patients (seen within 6 months, unless their notes state return in a year).

## 2018-05-13 ENCOUNTER — Telehealth (HOSPITAL_BASED_OUTPATIENT_CLINIC_OR_DEPARTMENT_OTHER): Payer: Self-pay

## 2018-05-13 NOTE — Telephone Encounter (Signed)
Called pt, she has plan for pain management for over the weekend.   Discussed red flag sy.

## 2018-05-13 NOTE — Telephone Encounter (Signed)
From: [name]  To: Zlomislic, Vinko, MD  Sent: 05/12/2018 7:10 PM PDT  Subject: 2-Procedural Question    Hi Doctor Zlomislic,    I am scheduled to meet with you on Monday the 16th. I asked Dr. Cherly Hensen as well to do so as I am not sure how the system works but my insurance tends to deny initially and I would like to streamline that so if it is possible for you or he to contact them to have the consult approved that would be appreciated.    I was admitted to the ER last Tuesday night by my PT when I was having radiating pains into my groin area. I had steroid injections two weeks ago for a bulging disc and facet joint syndrome at L4/L5. I swam this past Monday (treading water mostly) and that's all I can think of that may have triggered this to get much worse quickly. I am barely managing pain on Percocet every 6 hours that the ER doctor prescribed and hoping to get in sooner than later to meet with you. You were recommended as well by my friend Kendell Bane who I believe recently had back surgery with you.     I have one pre-approved MRI on my insurance and I was wondering if it would be helpful to schedule that soon as well since my last one was taken in June?    I'd appreciate any help or advice in the meantime. I look forward to meeting you.    Thank you!  Shantele  [phone]

## 2018-05-13 NOTE — Telephone Encounter (Signed)
MyChart message sent regarding pain meds.

## 2018-05-13 NOTE — ED Provider Notes (Signed)
History  Chief Complaint   Patient presents with   . Back Pain     Pt has back pain that radiates down RLE, rectum and pelvic area. Pain worse today after doing PT. No incontinence. 4 mg zofran given and pt vomited once with EMTs. Pain 10/10.      HPI     37 year old female with history of chronic back pain now with acute exacerbation after swimming yesterday, slowly progressive until now.  The patient is followed by spine physician, and has a documented follow-up appointment in 3 days. No fecal incontinence, no urinary retention, no saddle anesthesia, no history of trauma, no history of intravenous drug use, no history of TB, no chronic steroid use.  The pain is 10/10, radiating down bilateral lower extremities, electric/sharp/tingling in sensation, worse with movement, no palliative factors, constant though waxing and waning in severity.  The patient is able to ambulate with no weakness.      Past Medical History:   Diagnosis Date   . ADD (attention deficit disorder)    . Clavicular fracture        Past Surgical History:   Procedure Laterality Date   . ------------OTHER-------------  37 yo    humerus ORIF        Family History   Problem Relation Name Age of Onset   . Diabetes Paternal Uncle     . Breast Cancer Paternal Aunt     . Breast Cancer Mother  79   . Breast Cancer Paternal Grandmother     . Colon Cancer Neg Hx     . Hypertension Neg Hx     . Cholesterol/Lipid Disorder Neg Hx         Social History     Tobacco Use   . Smoking status: Never Smoker   . Smokeless tobacco: Never Used   Substance Use Topics   . Alcohol use: Yes     Comment: socially, couple drinks/week    . Drug use: Yes     Types: Marijuana     Comment: rare marijuana       Home Medication List  Prior to Admission Medications   Prescriptions Last Dose Informant Patient Reported? Taking?   Amphetamine-Dextroamphetamine (ADDERALL PO)   Yes No   Sig: as needed.   HYDROcodone-acetaminophen (NORCO) 5-325 MG tablet   No No   Sig: Take 1 tablet by  mouth every 8 hours as needed for Moderate Pain (Pain Score 4-6).   cyclobenzaprine (FLEXERIL) 10 MG tablet   No No   Sig: Take 1 tablet (10 mg) by mouth every 8 hours as needed for Muscle Spasms.   diclofenac (VOLTAREN) 1 % gel   No No   Sig: Apply 3 g topically 4 times daily.   drospirenone-ethinyl estradiol (YAZ) 3-0.02 MG tablet   No No   Sig: Take 1 tablet by mouth daily.   lidocaine (LIDODERM) 5 % patch   No No   Sig: Apply 1 patch topically every 24 hours. Leave patch on for 12 hours, then remove for 12 hours.   naproxen (NAPROSYN) 500 MG tablet   No No   Sig: Take 1 tablet (500 mg) by mouth 2 times daily as needed (pain). Take with meals.   zolpidem (AMBIEN) 10 MG tablet   No No   Sig: Take 1 tablet (10 mg) by mouth nightly as needed for Insomnia.      Facility-Administered Medications: None       Review of Systems  Constitutional: Negative for chills and fever.   HENT: Negative for congestion and sore throat.    Eyes: Negative for pain and redness.   Respiratory: Negative for chest tightness and shortness of breath.    Cardiovascular: Negative for chest pain and palpitations.   Gastrointestinal: Negative for abdominal pain, nausea and vomiting.   Endocrine: Negative for polyuria.   Genitourinary: Negative for dysuria and hematuria.   Musculoskeletal: Positive for back pain. Negative for gait problem and neck pain.   Skin: Negative for rash and wound.   Neurological: Negative for dizziness and weakness.   Psychiatric/Behavioral: Negative for behavioral problems. The patient is not nervous/anxious.        Physical Exam  BP 122/63 (BP Location: Left arm, BP Patient Position: Sitting)   Pulse 82   Temp 98.9 F (37.2 C)   Resp 16   Ht 5\' 10"  (1.778 m)   Wt 74.8 kg (165 lb)   SpO2 96%   BMI 23.68 kg/m     Physical Exam   Constitutional: She is oriented to person, place, and time. She appears well-developed and well-nourished. No distress.   HENT:   Head: Normocephalic.   Right Ear: External ear normal.     Left Ear: External ear normal.   Mucous membranes moist   Eyes: Pupils are equal, round, and reactive to light. EOM are normal. No scleral icterus.   Neck: Normal range of motion. Neck supple.   Cardiovascular: Normal rate and regular rhythm.   No murmur heard.  Pulmonary/Chest: Effort normal and breath sounds normal. No respiratory distress.   Abdominal: Soft. Bowel sounds are normal. There is no tenderness.   Musculoskeletal: Normal range of motion. She exhibits no edema.   Neurological: She is alert and oriented to person, place, and time. She exhibits normal muscle tone.   HF/HE/KF/KE/DF/PF/EHL 5/5 with pain  SILT L4/L5/S1  No saddle anesthesia  Ambulated without assistance   Skin: Skin is warm and dry. No rash noted.   Psychiatric: She has a normal mood and affect. Her behavior is normal.   Nursing note and vitals reviewed.    No results found.    Impression/Medical Decision Making:  37 year old female with acute on chronic back pain radiating down bilateral lower extremities  - patient in pain on arrival though nontoxic appearing  -vital signs all within normal limits, afebrile at 98.9  -physical exam with no focal weakness/numbness, no signs or symptoms or physical exam findings concerning for cauda equina, cord compression, AAA, GU pathology, spinal epidural abscess  -patient treated with IV opiates and IV antiemetics, with improvement in symptoms  -patient converted oral pain medication, prescribed additional medications for home use  -no indication for advanced imaging at this time  -patient to follow up with her spinal surgeon for further assessment and recommendations  -discharge instructions explained to patient at length and the patient voiced understanding and will return if any new or concerning issues arise             Farley Ly, MD  05/15/18 0025

## 2018-05-13 NOTE — Telephone Encounter (Signed)
From: [name]  To: Adelfa Koh, MD  Sent: 05/12/2018 7:06 PM PDT  Subject: 3-Referral Request Status    Hi Doctor Cherly Hensen,    I wanted to update you on my situation and also request that you start the approval process with my insurance to at least have the appointment with the surgeon. It has now been scheduled in about 2.5 weeks (and I would really like it to happen sooner than that if possible. The insurance tend to ask to talk to the doctor to approve that and I don't want it to be delayed any longer than necessary. So I'm hoping you could contact them or I may be able to contact them to have them call you but I don't know if it works like that.    On Tuesday I Marcelino Duster (my PT) had me admitted to the ER because I was having radiating sensations and pain in my groin area. The ER doctor has given me Percocet and directed me to take it every 6 hours. I seem to definitely need it at this point to just ride out the pain. I feel heat in my lower back and pressure. I swam (treading water mostly) this past Monday and I don't know if that caused my bulging disc to move or what has happened but it seems to have gotten much worse quickly. 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 I am not supposed to drive on this medication and currently it's barely managing pain.    I am thinking of asking family to come out because I'm concerned it's to the level I'm going to have a hard time taking care of myself soon. I am trying to move as little as possible to take care of myself and basic life necessities. Please advise.

## 2018-05-13 NOTE — Telephone Encounter (Signed)
Patient calling back same issue, she also reports she cant even wear stretchy pants he pressure from them gets really uncomfortable pain level 4-10 best number to reach her [phone].

## 2018-05-16 ENCOUNTER — Telehealth (HOSPITAL_BASED_OUTPATIENT_CLINIC_OR_DEPARTMENT_OTHER): Payer: Self-pay | Admitting: Orthopaedic Surgery

## 2018-05-16 ENCOUNTER — Ambulatory Visit: Payer: Medicaid Other | Attending: Physician Assistant | Admitting: Orthopaedic Surgery

## 2018-05-16 ENCOUNTER — Encounter (HOSPITAL_BASED_OUTPATIENT_CLINIC_OR_DEPARTMENT_OTHER): Payer: Self-pay | Admitting: Orthopaedic Surgery

## 2018-05-16 ENCOUNTER — Encounter (HOSPITAL_BASED_OUTPATIENT_CLINIC_OR_DEPARTMENT_OTHER): Payer: Self-pay | Admitting: Hospital

## 2018-05-16 ENCOUNTER — Encounter

## 2018-05-16 VITALS — BP 134/87 | HR 78

## 2018-05-16 DIAGNOSIS — G8929 Other chronic pain: Secondary | ICD-10-CM | POA: Insufficient documentation

## 2018-05-16 DIAGNOSIS — M4696 Unspecified inflammatory spondylopathy, lumbar region: Secondary | ICD-10-CM | POA: Insufficient documentation

## 2018-05-16 DIAGNOSIS — M539 Dorsopathy, unspecified: Principal | ICD-10-CM | POA: Insufficient documentation

## 2018-05-16 DIAGNOSIS — M545 Low back pain: Secondary | ICD-10-CM | POA: Insufficient documentation

## 2018-05-16 NOTE — Patient Instructions (Signed)
It was a pleasure seeing you today! You were seen today by Dr. Cherly Hensen. Today, we discussed:     1. MRI lumbar spine, MRI pelvis bone  - obtain information to schedule at either [facility] or [facility]    2. Follow up with Ortho Spine surgery    3. Follow up with your PCP for:    Pain medication management  Consideration for a consultation to [facility] for emotional intervention and support  Consider for a consultation to [facility]      Take care!  Sherrie George, MD PhD

## 2018-05-16 NOTE — Progress Notes (Signed)
Attending Note:     Attending Attestation:    I personally interviewed and examined the patient on 05/16/2018 , and I have reviewed the note by Dr. Juel Burrow from 05/16/2018.    I agree w/ the resident history, exam, assessment, and plan with the following additions:     Additional attending documentation:     S: Pain up/down, overall worse since her flare up in May 2019. Increased opioid usage, started this Summer 2019. No f/c, no weight loss, voiding well except constipation due to opioid. Pain generally in left leg, back, occasionally across lower right abdomen/pelvis. Progressively less functional this summer. She saw Ortho Spine surgery 05-03-18 and desired further surgical consultation, pending next week.    ED visit 05-10-18  Back Pain      Pt has back pain that radiates down RLE, rectum and pelvic area. Pain worse today after doing PT. No incontinence. 4 mg zofran given and pt vomited once with EMTs. Pain 10/10.      HPI   37 year old female with history of chronic back pain now with acute exacerbation after swimming yesterday, slowly progressive until now.  The patient is followed by spine physician, and has a documented follow-up appointment in 3 days. No fecal incontinence, no urinary retention, no saddle anesthesia, no history of trauma, no history of intravenous drug use, no history of TB, no chronic steroid use.  The pain is 10/10, radiating down bilateral lower extremities, electric/sharp/tingling in sensation, worse with movement, no palliative factors, constant though     Last note of PA Harris 05-03-18  She states it was mainly on the left, but feels it started becoming more prominent on the right.  She had bilateral L4-5 and L5-1 facet injections and MBB by Dr Cherly Hensen on 04/25/18.  She states the back pain has had some improvement since the injection, but the pain is still present.  She also c/o new symptoms of feeling "wobbly" and having a "vibrating" sensation in her left leg since the injection.     04-25-18  DESCRIPTION OF PROCEDURE: Bilateral L4/5 and l5/S1 Lumbosacral facet joint injections with steroid and medial branch nerve block using fluoroscopic guidance    O    GEN WNWD  NEURO Normal babinski, symmetric DTRs at the knees/ankles, no clonus on ankle jerk, full strength HF/KE/DF/EHL  MSK left straight leg raise to 70 degrees, hip ROM painless in the groin.    MRI LS SPINE 02-2018    A    Nonspecific low back pain  - no surgical spine indications seen on MRI 02-2018    Do not recommend further interventional procedures at this time.    Questions answered.    P    1. Updated MRI lumbar spine, MRI pelvis bone  - obtain information to schedule at either [facility] or [facility]    2. Follow up with Ortho Spine surgery    3. Follow up with your PCP for:    Pain medication management  Consideration for a consultation to [facility] for emotional intervention and support  Consider for a consultation to [facility]  PRN f/u        See the house officer's note for further details.        Sherrie George, M.D., Ph.D.  Clinical Professor, [place] Department of Orthopaedic Surgery  Chief, Physical Medicine and Rehabilitation   [place] [employer] medical consultant  [employer] consultant    [email or web address]

## 2018-05-16 NOTE — Telephone Encounter (Signed)
Patient is calling to change her MRI order to STAT as its going to take 3 wks to get in.

## 2018-05-16 NOTE — Progress Notes (Addendum)
Subjective:   Haley Barrett is a 37 year old female who is here for Low Back Pain and Leg Pain (left)    HPI: back pain with some left radicular symptoms. MRI LS SPINE with facet arthropathy seen at L4/5 and left L4/5 foraminal stenosis.  Last seen on 03/22/18 in clinic with the following plan:  "1. Continue PT, bring MRI LS SPINE report with you to share  2. Continue advil, discontinue if you have GI upset/heartburn issues  3. Consider occasional steroid injections into the lumbar facets (for back pain) or left L4/5 epidural space (for sciatic leg pain)  4. Lumbar facet syndrome information"    Interval History:  She had bilateral L4/5 and L5/S1 lumbar facet and MBB on 04/25/18 with Dr. Cherly Hensen. She reports that 1 week after the procedure, she noticed that she was able to go up on her toes, which she wasn't able to before. She overall was feeling well and decided to go to the pool/swimming on 05/09/18. Afterwards her pain has significantly worsened. She went to the ER on 9/3 due to the pain. She was giving dilaudid IM and was placed on oxycodone. She has an spine surgery appointment on 05/23/18 with Dr. Christa See. Overall her pain is worse. It is located in the low back with a sharp sensation. It radiates to bilateral hips and down lateral aspect of left thigh into the plantar aspect of her foot. She occasionally will have it on the right side. The pain is worse with bending over. She is now unable to walk for long distances (difficulty from parking lot), unable to care for herself - difficulty wearing clothing, standing for long enough to make meals, walking her dog. Her sleep is affected, without medication, she will wake up multiple times due to pain. She currently takes oxycodone BID. The dose before bedtime will allow her to remain asleep / not wake up from pain.     Review of Systems    Denies recent fever, chills, unintentional weight loss or gain  Denies b/b incontinence, saddle anesthesia  Denies recent  falls    Current Outpatient Medications   Medication Sig Dispense Refill   . Amphetamine-Dextroamphetamine (ADDERALL PO) as needed.     . cyclobenzaprine (FLEXERIL) 10 MG tablet Take 1 tablet (10 mg) by mouth every 8 hours as needed for Muscle Spasms. 30 tablet 0   . diclofenac (VOLTAREN) 1 % gel Apply 3 g topically 4 times daily. 1 Tube 3   . drospirenone-ethinyl estradiol (YAZ) 3-0.02 MG tablet Take 1 tablet by mouth daily. 84 tablet 3   . HYDROcodone-acetaminophen (NORCO) 5-325 MG tablet Take 1 tablet by mouth every 8 hours as needed for Moderate Pain (Pain Score 4-6). 45 tablet 0   . lidocaine (LIDODERM) 5 % patch Apply 1 patch topically every 24 hours. Leave patch on for 12 hours, then remove for 12 hours. 30 patch 0   . naproxen (NAPROSYN) 500 MG tablet Take 1 tablet (500 mg) by mouth 2 times daily as needed (pain). Take with meals. 100 tablet 1   . ondansetron (ZOFRAN ODT) 8 MG disintegrating tablet Take 1 tablet (8 mg) by mouth every 8 hours as needed for Nausea/Vomiting. 15 tablet 0   . oxyCODONE-acetaminophen (PERCOCET) 5-325 MG tablet Take 1 tablet by mouth every 6 hours as needed for Severe Pain (Pain Score 7-10). 15 tablet 0   . zolpidem (AMBIEN) 10 MG tablet Take 1 tablet (10 mg) by mouth nightly as needed for Insomnia. 30  tablet 0     No current facility-administered medications for this visit.      Allergies   Allergen Reactions   . Tramadol Nausea and Vomiting       Reviewed patients pertinent information related to social history, past medical, past surgical, and family history.     Objective:  Vital signs: Blood pressure (BP): (134)/(87) 134/87 (09/09 1139)  Heart Rate:  [78] 78 (09/09 1139)  Pain Score: 5 (09/09 1139)    Physical Exam   General: well nourished, in no acute distress  Psych: mildly anxious, appropriate affect and mood, cooperative  Cardiac: well perfused, no peripheral cyanosis  Pulm: aerating on room air, no accessory muscle use  MMT: 5/5 in b/l HF, KE, DF, PF, EHL  Sensory:  intact to light touch in L2-S1 distribution  DTR: 2+ symmetric patella and achilles, downward going toes  Gait: no assistive device, able to walk on toes  Lumbar ROM: flex to 100 deg w/ pain, ext 10 deg w/ pain, lateral rotation and bending intact  Palpation: (+)TTP lumbar spinous process, paraspinals. Non-TTP b/l SIJ, GT, ITB  Left straight leg raise to 70 deg  B/l Hip ROM intact, no pain illicited, neg FABIR/FADIR     Assessment/Plan:  37 year old female with back pain with some left radicular symptoms. MRI LS SPINE with facet arthropathy seen at L4/5 and left L4/5 foraminal stenosis. S/p  bilateral L4/5 and L5/S1 lumbar facet and MBB on 04/25/18 with Dr. Cherly Hensen. Exacerbated symptoms after swimming on 05/09/18.    - order MRI L-spine, pelvis  - keep appointment and follow with ortho spine surgery on 05/23/18 with Dr. Christa See  - recommend f/u with PCP for evaluation: pain medication management, consider consultation to pain management  - follow up PRN    Seen and discussed with Dr. Cherly Hensen

## 2018-05-17 ENCOUNTER — Encounter (HOSPITAL_BASED_OUTPATIENT_CLINIC_OR_DEPARTMENT_OTHER): Payer: Self-pay | Admitting: Orthopaedic Surgery

## 2018-05-17 ENCOUNTER — Telehealth (HOSPITAL_BASED_OUTPATIENT_CLINIC_OR_DEPARTMENT_OTHER): Payer: Self-pay | Admitting: Orthopaedic Surgery

## 2018-05-17 ENCOUNTER — Encounter (INDEPENDENT_AMBULATORY_CARE_PROVIDER_SITE_OTHER): Payer: Self-pay | Admitting: Internal Medicine

## 2018-05-17 ENCOUNTER — Ambulatory Visit (INDEPENDENT_AMBULATORY_CARE_PROVIDER_SITE_OTHER): Payer: Medicaid Other | Admitting: Internal Medicine

## 2018-05-17 VITALS — BP 133/80 | HR 92 | Temp 98.3°F | Resp 16 | Ht 70.0 in | Wt 166.0 lb

## 2018-05-17 DIAGNOSIS — M5441 Lumbago with sciatica, right side: Secondary | ICD-10-CM

## 2018-05-17 DIAGNOSIS — G4700 Insomnia, unspecified: Secondary | ICD-10-CM

## 2018-05-17 DIAGNOSIS — M5442 Lumbago with sciatica, left side: Secondary | ICD-10-CM

## 2018-05-17 DIAGNOSIS — G8929 Other chronic pain: Secondary | ICD-10-CM

## 2018-05-17 MED ORDER — OXYCODONE-ACETAMINOPHEN 5-325 MG OR TABS
1.00 | ORAL_TABLET | Freq: Four times a day (QID) | ORAL | 0 refills | Status: DC | PRN
Start: 2018-05-17 — End: 2018-07-11

## 2018-05-17 MED ORDER — ZOLPIDEM TARTRATE 10 MG OR TABS
10.00 mg | ORAL_TABLET | Freq: Every evening | ORAL | 0 refills | Status: DC | PRN
Start: 2018-05-17 — End: 2018-07-20

## 2018-05-17 NOTE — Patient Instructions (Signed)
I refilled percocet and ambien for you.   Don't take the norco or any xanax while you are taking percocet.     We will set up an appointment with a new primary care doctor here!

## 2018-05-17 NOTE — Progress Notes (Signed)
Chief Complaint   Patient presents with   . ER F/U   . Low Back Pain   . Refill Request     Xanax and Ambian       SUBJECTIVE: Haley Barrett is a 37 year old female female here for acute visit.    Pt in [facility]. No regular PCP. Pt was over 20 minutes late to her appt today.     Last seen in our clinic 04/14/18 for chronic LBP with sciatica for the last 4 months. Since then, pt received ESI on 04/25/18. Initially had improvement of pain, but after she went swimming on 05/09/18, she had exacerbation of her pain. Resulted in ED visit on 05/10/18.   In ED, pt was given dilaudid IM and prescribed oxycodone. Pt saw Dr. Cherly Hensen for f/u yesterday.     Has appt with spine surgeon tomorrow. Yesterday pt said she developed some urinary incontinence. Didn't feel it when it happened. No perineal numbness/tingling. Has chronic left leg numbness.     Pt took percocet every 6 hours. Ran out of them yesterday  Now taking Norco prn since yesterday. Took a 1/2 this morning. Felt the percocet worked a lot better for her.     Pt has been dealing with chronic lower back pain. Has been in PT for 2 years. Still taking naproxen 500mg  bid.   Initially prescribed Tramadol on 03/31/17 but Tramadol made her "miserable". Pain regimen then changed to short course Norco, prescribed 04/01/18. Pt had a refill of Norco 04/14/18, then received rx for oxycodone 05/11/18.     Pt also takes xanax and [place] - prescribed by an outside doctor.   Has transferred her [place] prescription here. Says she takes about 1/2 tablet qhs to help her stay asleep. Uses xanax prn for flights and stressful situations. Took one yesterday to see if it helped with her back pain.     ROS:  Bolded are positive  Constitutional: negative for fever, chills, weight loss or weight gain  CV: negative for chest pain, palpitations, LEE  Respiratory: negative for SOB, DOE, cough  GI: negative for nausea, vomiting, abdominal pain    Past medical history, social and family history reviewed and updated today  as below.    Allergies and medications reviewed and updated as below.    Patient Active Problem List   Diagnosis   . ADD (attention deficit disorder)   . Birth control   . Finger dislocation, subsequent encounter   . Chronic bilateral low back pain, with sciatica presence unspecified   . Lumbar spondylosis       Current Outpatient Medications   Medication Sig   . Amphetamine-Dextroamphetamine (ADDERALL PO) as needed.   . diclofenac (VOLTAREN) 1 % gel Apply 3 g topically 4 times daily.   . drospirenone-ethinyl estradiol (YAZ) 3-0.02 MG tablet Take 1 tablet by mouth daily.   Mar[place] Kitchen HYDROcodone-acetaminophen (NORCO) 5-325 MG tablet Take 1 tablet by mouth every 8 hours as needed for Moderate Pain (Pain Score 4-6).   Marland Kitchen lidocaine (LIDODERM) 5 % patch Apply 1 patch topically every 24 hours. Leave patch on for 12 hours, then remove for 12 hours.   . naproxen (NAPROSYN) 500 MG tablet Take 1 tablet (500 mg) by mouth 2 times daily as needed (pain). Take with meals.   . ondansetron (ZOFRAN ODT) 8 MG disintegrating tablet Take 1 tablet (8 mg) by mouth every 8 hours as needed for Nausea/Vomiting.   Marland Kitchen oxyCODONE-acetaminophen (PERCOCET) 5-325 MG tablet Take 1 tablet  by mouth every 6 hours as needed for Severe Pain (Pain Score 7-10).   Marland Kitchen zolpidem (AMBIEN) 10 MG tablet Take 1 tablet (10 mg) by mouth nightly as needed for Insomnia.     No current facility-administered medications for this visit.        Allergies   Allergen Reactions   . Tramadol Nausea and Vomiting       Past Medical History:   Diagnosis Date   . ADD (attention deficit disorder)    . Clavicular fracture      Family History   Problem Relation Name Age of Onset   . Diabetes Paternal Uncle     . Breast Cancer Paternal Aunt     . Breast Cancer Mother  40   . Breast Cancer Paternal Grandmother     . Colon Cancer Neg Hx     . Hypertension Neg Hx     . Cholesterol/Lipid Disorder Neg Hx       Social History     Tobacco Use   . Smoking status: Never Smoker   . Smokeless tobacco:  Never Used   Substance Use Topics   . Alcohol use: Yes     Comment: socially, couple drinks/week    . Drug use: Yes     Types: Marijuana     Comment: rare marijuana     Social History     Social History Narrative    Single    Psychotherapist - just beginning, in [employer]. Went to school in [place].    Born in [place] and moved to [place] 6 years ago    Tour guide-food and history tours     Hobbies: beach volleyball, horse back riding, exercise        OBJECTIVE:  BP 133/80 (BP Location: Right arm, BP Patient Position: Sitting, BP cuff size: Regular)   Pulse 92   Temp 98.3 F (36.8 C) (Temporal)   Resp 16   Ht 5\' 10"  (1.778 m)   Wt 75.3 kg (166 lb)   LMP 05/17/2018   SpO2 99%   Breastfeeding? No   BMI 23.82 kg/m    Gen: alert, in mild distress, changing position frequently during exam due to back pain  ENMT: PERRL; MMM, OP clear without erythema or exudates  Neck: supple, no LAD  M/S:  no edema.  Normal gait.    Labs and chart reviewed.    Assessment and Plan: Haley Barrett is a 37 year old female here for f/u of lumbar radiculopathy, acute on chronic, ongoing for 4 months. Pt seeing spine surgeon tomorrow. Cont PT. Okay for short course of percocet refill. I checked CURES - pt filling rx appropriately and using meds less than as prescribed for the most part.     Pt will need to reestablish care in [facility]. Appt made her pt today.     Diagnoses and all orders for this visit:    Chronic bilateral low back pain with bilateral sciatica  -     oxyCODONE-acetaminophen (PERCOCET) 5-325 MG tablet; Take 1 tablet by mouth every 6 hours as needed for Severe Pain (Pain Score 7-10).    Insomnia, unspecified type  -     zolpidem (AMBIEN) 10 MG tablet; Take 1 tablet (10 mg) by mouth nightly as needed for Insomnia.    Other orders  -     CURES Review Documentation - I Reviewed CURES      Patient Instructions   I refilled percocet and ambien  for you.   Don't take the norco or any xanax while you are taking  percocet.     We will set up an appointment with a new primary care doctor here!      Follow-up next month with new PCP.  Barriers to learning assessed: None.  Patient verbalizes understanding and is agreeable to above plan.

## 2018-05-17 NOTE — Telephone Encounter (Signed)
Haley Barrett  requested callback for advice on: Pt is very concern with a new issue and want's Dr. Cherly Hensen to know, pt states she is been having an incontinence problem for the past few days and is getting worse, pt is concern is due to back problem and pressure, pt is requesting to speak to RN for advise. Also pt will be seeing her PCP today.           Treating Physician: Dr. Cherly Hensen      LOV notes/plan:  Assessment/Plan:  37 year old female with back pain with some left radicular symptoms. MRI LS SPINE with facet arthropathy seen at L4/5 and left L4/5 foraminal stenosis. S/p  bilateral L4/5 and L5/S1 lumbar facet and MBB on 04/25/18 with Dr. Cherly Hensen. Exacerbated symptoms after swimming on 05/09/18.    - order MRI L-spine, pelvis  - keep appointment and follow with ortho spine surgery on 05/23/18 with Dr. Christa See  - recommend f/u with PCP for evaluation: pain medication management, consider consultation to pain management  - follow up PRN        Best number to be reached at: [phone]            Route to RN pool if requesting clinical advice, route to MA if administrative advice

## 2018-05-17 NOTE — Telephone Encounter (Signed)
Dr Cherly Hensen directed pt to call [facility] vs a STAT order. Matter resolved

## 2018-05-17 NOTE — Telephone Encounter (Signed)
From: Haley Barrett  To: Adelfa Koh, MD  Sent: 05/16/2018 5:52 PM PDT  Subject: 3-Referral Request Status    Hi Doctor Cherly Hensen,    You asked today when I first started taking the narcotic medication. They gave me a hydrocodone prescription around the beginning of August. I looked it up on my records today. They also gave me two shots of toradol within a few weeks of each other sometime around there I believe because of (what at the time) felt like intense muscle pain in my right side.     Also I believe they have a message in to you but I think the earliest the two MRI places could get me in was 3 weeks from now. Is there any other places you know that might take [facility] or is that the quickest I can get it done?    Thanks,  [name]

## 2018-05-17 NOTE — Telephone Encounter (Signed)
LOV 05/16/18:   Spoke to pt and changed appt for Dr. Herma Carson to 05/18/18. [facility]

## 2018-05-17 NOTE — Telephone Encounter (Signed)
Noted. Routed to Dr Cherly Hensen for review if STAT is medical necessary

## 2018-05-18 ENCOUNTER — Encounter (INDEPENDENT_AMBULATORY_CARE_PROVIDER_SITE_OTHER): Payer: Self-pay | Admitting: Orthopaedic Surgery of the Spine

## 2018-05-18 ENCOUNTER — Ambulatory Visit (INDEPENDENT_AMBULATORY_CARE_PROVIDER_SITE_OTHER): Payer: Medicaid Other | Admitting: Orthopaedic Surgery of the Spine

## 2018-05-18 ENCOUNTER — Inpatient Hospital Stay (INDEPENDENT_AMBULATORY_CARE_PROVIDER_SITE_OTHER)
Admit: 2018-05-18 | Discharge: 2018-05-18 | Disposition: A | Discharge status: Home Health | Payer: Medicaid Other | Attending: Orthopaedic Surgery of the Spine | Admitting: Orthopaedic Surgery of the Spine

## 2018-05-18 ENCOUNTER — Encounter

## 2018-05-18 VITALS — BP 138/89 | HR 87 | Temp 97.7°F | Ht 70.0 in | Wt 170.0 lb

## 2018-05-18 DIAGNOSIS — M4310 Spondylolisthesis, site unspecified: Secondary | ICD-10-CM

## 2018-05-18 DIAGNOSIS — M5116 Intervertebral disc disorders with radiculopathy, lumbar region: Principal | ICD-10-CM

## 2018-05-18 DIAGNOSIS — M4696 Unspecified inflammatory spondylopathy, lumbar region: Secondary | ICD-10-CM

## 2018-05-18 DIAGNOSIS — M5416 Radiculopathy, lumbar region: Secondary | ICD-10-CM

## 2018-05-18 MED ORDER — METHYLPREDNISOLONE 4 MG OR KIT
ORAL_TABLET | ORAL | 0 refills | Status: DC
Start: 2018-05-18 — End: 2018-06-02

## 2018-05-18 NOTE — Telephone Encounter (Signed)
Dr Cherly Hensen replied. Closing encounter

## 2018-05-18 NOTE — Telephone Encounter (Signed)
From: [name]  To: Adelfa Koh, MD  Sent: 05/17/2018 3:02 PM PDT  Subject: 3-Referral Request Status    Hi,    No I didn't have the contact info for the other place. They were both scheduling about the same time frame out which is about 3 weeks though. Right now I have an appointment for Sept 30th at [facility]. They said the doctor would have to re-order on a more urgent status for anything sooner.    Thanks,  [name]  ----- Message -----  From: Adelfa Koh, MD  Sent: 05/17/2018 12:41 PM PDT  To: [name]  Subject: RE: 3-Referral Request Status  Thanks - we usually refer our patients to [facility] - if my staff didn't provide that information to you as promised, you can locate it online:    [email or web address]    Best,  Dr Cherly Hensen        ----- Message -----   From: [name]   Sent: 05/16/2018 5:52 PM PDT   To: Adelfa Koh, MD  Subject: 3-Referral Request Status    Hi Doctor Cherly Hensen,    You asked today when I first started taking the narcotic medication. They gave me a hydrocodone prescription around the beginning of August. I looked it up on my records today. They also gave me two shots of toradol within a few weeks of each other sometime around there I believe because of (what at the time) felt like intense muscle pain in my right side.     Also I believe they have a message in to you but I think the earliest the two MRI places could get me in was 3 weeks from now. Is there any other places you know that might take [facility] or is that the quickest I can get it done?    Thanks,  [name]

## 2018-05-20 NOTE — Progress Notes (Signed)
ORTHOPAEDIC SPINE SURGERY     Visit Type: New Visit    Requesting Provider: Talitha Givens    Reason for Visit: New Patient (Herniated disc)        History Of Present Illness: 37 year old female presents to spine clinic for initial evaluation of complaints of low back pain. She notes longstanding and worsening progression of low back pain and notably severe left posterior leg pain. Pain is worse especially over the past 6 months during which time she has been severely limited and nearly bed ridden on account of her low back pain and her leg pain. 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 She states that over the past six months she is unable to stand or walk for any prolonged period of time and has been unable to return to regular activities or working out or volleyball. She denies any focal motor deficits or weakness. Pain is worse with activity and relieved with rest.  Patient rates her pain as 5, largely improved after recent medrol dose pack she had been provided by outside provider.  She has had extensive course of conservative management including time, activity modification, pain medications, PT and injections in addition to chiropractic, massage and acupuncture without any sustained relief, and with progression. Patient reports no changes in bowel and bladder function.    Allergies:   Allergies   Allergen Reactions   . Tramadol Nausea and Vomiting       Medications:   Current Outpatient Medications   Medication Sig   . Amphetamine-Dextroamphetamine (ADDERALL PO) as needed.   . diclofenac (VOLTAREN) 1 % gel Apply 3 g topically 4 times daily.   . drospirenone-ethinyl estradiol (YAZ) 3-0.02 MG tablet Take 1 tablet by mouth daily.   Marland Kitchen HYDROcodone-acetaminophen (NORCO) 5-325 MG tablet Take 1 tablet by mouth every 8 hours as needed for Moderate Pain (Pain Score 4-6).   Marland Kitchen lidocaine (LIDODERM) 5 % patch  Apply 1 patch topically every 24 hours. Leave patch on for 12 hours, then remove for 12 hours.   . methylPREDNISolone (MEDROL DOSEPACK) 4 MG tablet Take as directed on package   . naproxen (NAPROSYN) 500 MG tablet Take 1 tablet (500 mg) by mouth 2 times daily as needed (pain). Take with meals.   . ondansetron (ZOFRAN ODT) 8 MG disintegrating tablet Take 1 tablet (8 mg) by mouth every 8 hours as needed for Nausea/Vomiting.   Marland Kitchen oxyCODONE-acetaminophen (PERCOCET) 5-325 MG tablet Take 1 tablet by mouth every 6 hours as needed for Severe Pain (Pain Score 7-10).   Marland Kitchen zolpidem (AMBIEN) 10 MG tablet Take 1 tablet (10 mg) by mouth nightly as needed for Insomnia.     No current facility-administered medications for this visit.                                 Past Medical History:  has a past medical history of ADD (attention deficit disorder) and Clavicular fracture.    Past Surgical History:  has a past surgical history that includes ------------other------------- (37 yo).    Social History:  reports that she has never smoked. She has never used smokeless tobacco. She reports that she drinks alcohol. She reports that she has current or past drug history. Drug: Marijuana.    Family History: family history includes Breast Cancer in her paternal aunt and paternal  grandmother; Breast Cancer (age of onset: 7160) in her mother; Diabetes in her paternal uncle.    Review of Systems:  CONSTITUTIONAL: No unexpected weight loss, fevers, chills, or anorexia. SKIN: Noncontributory. EARS, NOSE AND THROAT: Noncontributory. EYES: Noncontributory. LUNGS: Noncontributory. CARDIAC: Noncontributory. GASTROINTESTINAL: Noncontributory. GENITOURINARY: Symptoms as noted above in HPI. NEUROLOGIC: Symptoms as noted above in HPI. MUSCULOSKELETAL: Symptoms as noted above in HPI.    Physical Examination:  BP 138/89 (BP Location: Left arm, BP Patient Position: Sitting, BP cuff size: Regular)   Pulse 87   Temp 97.7 F (36.5 C) (Oral)   Ht 5\' 10"  (1.778  m)   Wt 77.1 kg (170 lb)   LMP 05/17/2018   BMI 24.39 kg/m   CONSTITUTIONAL: Well appearing and well groomed. PSYCHOLOGICAL: Alert and oriented and appropriate to situation. INTEGUMENT: Intact. VASCULAR: Radial and pedal pulses are intact and symmetric. EXTREMITIES: Bilateral upper and lower extremities have good range of motion and no significant deformities. GAIT: Brisk with good coordination.     CERVICAL SPINE: Nontender to palpation. Spurling's test negative. Sensation intact of the upper extremities.    RANGE OF MOTION (in degrees)       RT LT  Lateral motion   80  80  Lateral bend   45  45  Flexion 40 degrees. Extension 35 degrees. Motion in all planes is nonpainful.    MOTOR    RT LT  Trapezius   5/5  5/5  Deltoid    5/5  5/5  Bicep    5/5  5/5  Tricep    5/5  5/5  Wrist flexor   5/5  5/5  Wrist extensor  5/5  5/5  Intrinsics   5/5  5/5  Grip    5/5  5/5    DEEP TENDON REFLEXES       RT  LT  Bicep    2+  2+  Brachioradialis  2+  2+  Tricep    2+  2+    THORACIC SPINE  Nontender to percussion. Alignment - normal, no paravertebral muscle fullness. Sensation intact. Beevor's negative. Abdominal reflexes are absent and symmetric.     LUMBAR SPINE  Nontender to percussion. Heel to toe walk without deficit. Sensation intact of the lower extremities. Trendelenburg sign negative. Skin intact.    RANGE OF MOTION        RT  LT  lateral bend, normal at 25 degrees 25  25  rotation, normal at 30 degrees 30  30  Flexion - normal at 40 degrees. Extension - normal at 10 degrees.    MOTOR STRENGTH       RT  LT  Iliopsoas   5/5  5/5  Quadricep   5/5  5/5  Anterior tibialis   5/5  5/5  Extensor hallucis longus 5/5  5/5  Gastrocsoleus  5/5  5/5    DEEP TENDON REFLEXES       RT  LT  Patellar   2+  2+  Achilles   2+  2+    PATHOLOGIC REFLEXES      RT LT  Clonus    Neg Neg  Babinski   Neg Neg  Hoffmans   Neg Neg    STRAIGHT LEG RAISING       RT  LT  Sitting @ 90 degrees   Neg Neg    IMAGING STUDIES:   Plain radiographs lumbar  spine shows:  IMPRESSION:  Degenerative spondylolisthesis with progression of severity of spondylolisthesis by 3-4  mm compared to prior imaging, with segmental kyphosis. Mild to moderate lower lumbar facet arthropathy with alignment abnormalities as described.    MRI lumbar spine (outside CD, uploaded in PACS) shows facet hypertrophy with crescent sign L4-5 with resultant severe lateral recess, foraminal and central stenosis in the setting of degenerative spondylolisthesis, largely reduced on supine imaging.    OTHER MEDICAL RECORDS/IMAGING STUDIES REVIEWED: None    IMPRESSION:   Encounter Diagnoses   Name Primary?   . Lumbar disc herniation with radiculopathy Yes   . Degenerative spondylolisthesis    . Lumbar radiculopathy        PLAN/DISCUSSION: Reviewed clinical history as well as exam results and imaging findings with patient in detail. Imaging shows progression of spondylolisthesis and likely stenosis L4-5 which does correlate with the distribution of her symptoms. We discussed options in detail. She has a repeat MRI that has been ordered by outside provider to evaluate for HNP. We explained to her that symptoms are related to progression of mechanical instability at L4-5 with resultant stenosis that correlates with the mechanical and positional nature of her symptoms and pain. We discussed options at length as she has had extensive conservative management in the form of time, activity modification, pain medications, PT and injections without any sustained relief of her symptoms. She has also tried unsuccessfully chiropractic, acupuncture and massage without relief. We discussed that surgical address would likely involve fusion L4-5 with decompression and result in nearly 90% relief of symptoms. She is very apprehensive about any surgical address, certainly fusion and inquired about microdiscectomy. We explained that there is no evidence of HNP and therefore microdiscectomy would not be indicated, and  furthermore, in addition to laminoforaminotomy or decompression alone, would likely contribute to further progression of instability. She expressed her understanding of this and wishes to think about it more and follow up after additional imaging. She requested a medrol pack should symptoms not improve, rx provided.  We discussed she may continue injections or PT, or follow up with pain management accordingly.   We spent 45 minutes with this patient. More than 50 percent of this time was spent discussing the diagnosis and the treatment plan. All questions were answered and Caytlin Better understood and was satisfied with this plan.     FOLLOWUP: Return to clinic six weeks. The patient is encouraged to call us with any questions or problems in the interim. Our contact numbers were given to the patient.

## 2018-05-22 ENCOUNTER — Encounter (INDEPENDENT_AMBULATORY_CARE_PROVIDER_SITE_OTHER): Payer: Self-pay | Admitting: Orthopaedic Surgery of the Spine

## 2018-05-23 ENCOUNTER — Ambulatory Visit (HOSPITAL_BASED_OUTPATIENT_CLINIC_OR_DEPARTMENT_OTHER): Payer: Medicaid Other | Admitting: Orthopaedic Surgery of the Spine

## 2018-05-24 NOTE — Telephone Encounter (Signed)
From: Jackelyn HoehnMegan Rader  To: Zlomislic, Vinko, MD  Sent: 05/22/2018 3:18 PM PDT  Subject: 1-Non Urgent Medical Advice    Hi Dr. Herma CarsonZ or associates,     What kind of materials are used in the implant?  How long do they last?    Do you people ever break them from just general high impact activities like horseback riding or volleyball?  Do you expect I'd be able to do both of these activities after having this done? (Once it's fully healed of course) I'm not a serious horseback rider anymore, but would like to be able to do so when available.     What does the PT regimen look like afterwards?    How many days to people generally spend in the hospital for this?  How long would I need to make sure I have people around to help me with the basics of daily living/walking my dog, etc.?    Are there potential risks or limitations I should be aware of after having it done? What is the success rate for surgeries like mine?    Is laparoscopic surgery an option for me? And if it is what would be the pros and cons of this?    Would there be any potential issue were I to get pregnant after having this done?    I'm wondering if you can recommend a brace that I could wear in the meantime that might be helpful also.  What happens if the fusion is not successful? And also if you could explain a bit more how it works that would be helpful for me.    Will I feel the difference in range of mobility or flexibility?  Is there any reason you know of why my spine is able to move forward at this joint? Is it likely because of some kind of injury? Or the way my facet joints fit together?     Thanks,  Haley Barrett  [phone]

## 2018-05-26 ENCOUNTER — Encounter (HOSPITAL_BASED_OUTPATIENT_CLINIC_OR_DEPARTMENT_OTHER): Payer: Self-pay | Admitting: Orthopaedic Surgery

## 2018-05-26 NOTE — Telephone Encounter (Signed)
Please thank patient, and encourage her to continue working with our department authorization team to obtain approval and scheduling of imaging studies.

## 2018-05-26 NOTE — Telephone Encounter (Signed)
From: Haley Barrett  To: Denzil Hughes, MD  Sent: 05/26/2018 9:48 AM PDT  Subject: 3-Referral Request Status    Hi Doctor Radene Knee,    I met with Doctor Z. He's recommending a spinal fusion at L4 L5 because he believes my spine can move almost a centimeter right now. I don't disbelieve him as I can actually feel it moving around but I know the wise thing to do is get a second opinion and wondered if you recommended anyone?    I am still waiting on the MRIs you ordered to get cleared as well. I called the radiology department and [facility], but I didn't know if there was anything else I could do to make sure I actually get to keep my appointments to have them on Setpember 30th. I'm not clear on why it should take more than a month I've been waiting to clear through my insurance.    Thanks,  Haley Barrett  ----- Message -----  From: Denzil Hughes, MD  Sent: 05/17/2018 12:41 PM PDT  To: Haley Barrett  Subject: RE: 3-Referral Request Status  Thanks - we usually refer our patients to [facility] - if my staff didn't provide that information to you as promised, you can locate it online:    [email or web address]    Best,  Dr Radene Knee        ----- Message -----   From: Haley Barrett   Sent: 05/16/2018 5:52 PM PDT    To: Denzil Hughes, MD  Subject: 3-Referral Request Status    Hi Doctor Radene Knee,    You asked today when I first started taking the narcotic medication. They gave me a hydrocodone prescription around the beginning of August. I looked it up on my records today. They also gave me two shots of toradol within a few weeks of each other sometime around there I believe because of (what at the time) felt like intense muscle pain in my right side.     Also I believe they have a message in to you but I think the earliest the two MRI places could get me in was 3 weeks from now. Is there any other places you know that might take [facility] or is that the quickest I can get it  done?    Thanks,  Haley Barrett

## 2018-05-30 ENCOUNTER — Telehealth (INDEPENDENT_AMBULATORY_CARE_PROVIDER_SITE_OTHER): Payer: Self-pay | Admitting: Internal Medicine

## 2018-05-30 NOTE — Telephone Encounter (Addendum)
MD ACTION REQUESTED: NO/FYI only    RN ACTION: Appt given w/ first avail MD per pts request  Appt scheduled:   Merge down applied: No  Decision Support tool used: [place]    Reason for call: sxs/appt  CC: back pain  -steroid injections 4 weeks ago  -requesting pain medications    Onset: 4 months  Associated symptoms: none  Does anything make it worse? no   Have you tried anything to relieve your symptoms? percocet  Do you feel this issue can wait for you to see your PCP or do you want to be seen sooner by any provider?  pcp    DENIES:any other sxs    ER Precautions reviewed: chest pain, sob, severe headache, numbness or weakness on one side, feeling faint/dizziness, confusion, slurred speech.     Patient verbalizes understanding and agrees with plan of care.  RN Encouraged patient to call back PRN or if symptoms worsen or persist.     [place] [facility] Risk Score 1    Pertinent PMHx:   Patient Active Problem List   Diagnosis   . ADD (attention deficit disorder)   . Birth control   . Finger dislocation, subsequent encounter   . Chronic bilateral low back pain, with sciatica presence unspecified   . Lumbar spondylosis       Future Appointments   Date Time Provider Department Center   06/02/2018 11:10 AM Meredeth IdeAsghar, Ali, [facility]   06/06/2018 12:45 PM CMR M[facility] MRI MUC   06/06/2018  1:30 PM CMR MUC MRI MUC   06/08/2018  2:20 PM Zlomislic, Alger MemosVinko, MD [facility]   07/04/2018  1:20 PM Thornell SartoriusVargas Valdivieso, Toy Careiego Alejandro, MD [facility]     Last visit in this department Visit date not found  Next visit in this department Visit date not found  RN confirmed pt's pharmacy and allergies     [facility] [phone][id number] [address] [address]  [address] [place] [address]  [place] [id number]  Phone: [phone] Fax: [phone]     Date: 05/30/2018   Time: 1:13 PM   Name of PCP Provider: Clinton SawyerHanson, Haley Barrett     Direct call transfer to Triage RN  Patient name: Haley HoehnMegan Barrett 37 year old   Verified DOB  Pt advised all calls are  recorded for quality assurance.

## 2018-05-30 NOTE — Telephone Encounter (Signed)
Symptom Call          Next office visit:  N/A  List the date of PCP first available: (only fill in if Same Day/3 Day appt not scheduled):  Acute:*                   Return:*  Did you offer Express Care/Urgent Care: (if met criteria for Same Day/3 Day scheduling but declines to schedule) No    What symptom is the patient experiencing? Back pain      Name of PCP Provider: Edwin Cap PCP  Insurance Coverage Verified: Active- in network  Last office visit: 05/17/18    Who is reporting the symptoms? Incoming call from patient    Is this a new or ongoing symptom? ongoing  Estimated time since experiencing symptom(s)? N/A    Best way to contact patient: [phone]   Alternative communication method:  N/A

## 2018-05-31 ENCOUNTER — Ambulatory Visit (HOSPITAL_BASED_OUTPATIENT_CLINIC_OR_DEPARTMENT_OTHER): Payer: Medicaid Other | Admitting: Orthopaedic Surgery

## 2018-06-01 ENCOUNTER — Encounter (HOSPITAL_BASED_OUTPATIENT_CLINIC_OR_DEPARTMENT_OTHER): Payer: Self-pay | Admitting: Orthopaedic Surgery

## 2018-06-01 ENCOUNTER — Telehealth (HOSPITAL_BASED_OUTPATIENT_CLINIC_OR_DEPARTMENT_OTHER): Payer: Self-pay | Admitting: Orthopaedic Surgery

## 2018-06-01 ENCOUNTER — Encounter: Payer: Self-pay | Admitting: Hospital

## 2018-06-01 NOTE — Telephone Encounter (Signed)
Pt is aware MRI was denied. Pt wants to know what she can do to get MRI approve.  She was told by [facility]. They need a peer to peer.    She does not have any other information from insurance.     MRI Lumbar Spine W/O Contrast [[id number]] (Order [id number])

## 2018-06-01 NOTE — Telephone Encounter (Signed)
Haley Barrett  [birth year]    MRI Pelvis Bone W/O Contrast [[id number]] (Order [id number])    MRI Lumbar Spine W/O Contrast [[id number]] (Order [id number])     MRI Lumbar Spine W/O Contrast [[id number]] (Order [id number])     Ordering Provider:   Adelfa Koh, MD     Party Requesting Peer-to-Peer:   [facility]  Phone number to call: [phone]     Best Timeframe to call: Patient has appointment 9/30    Reference #   TRACKING#: [id number]     When is case set to close? MRI Scheduled for 9/30    Can RN provide additional information?    ---------------------------------------------------------------------------------    LOV: 05/16/18  Dr. Cherly Hensen  Assessment/Plan:  37 year old female with back pain with some left radicular symptoms. MRI LS SPINE with facet arthropathy seen at L4/5 and left L4/5 foraminal stenosis. S/p  bilateral L4/5 and L5/S1 lumbar facet and MBB on 04/25/18 with Dr. Cherly Hensen. Exacerbated symptoms after swimming on 05/09/18.    - order MRI L-spine, pelvis  - keep appointment and follow with ortho spine surgery on 05/23/18 with Dr. Christa See  - recommend f/u with PCP for evaluation: pain medication management, consider consultation to pain management  - follow up PRN      Patient calling because insurance is requiring a peer to peer review in order to approver her insurance please see my chart message from today:    ----------------------------------------------------------------------------------  Good morning,     Below is the following information for the denials per your request.     Dr. Cherly Hensen:     Haley Barrett has denied the request for the MRI LUMBAR SPINE W/O CONTRAST, MRI PELVIS BONE W/O CONTRAST CPT 72148, 74259 to be performed on DOS 06/06/2018. Berkley Harvey has been denied due to no recent ultrasound, xray, no lump or mass, and no pain.     - To overturn this decision, either yourself; a nurse practitioner, or physician's assistant may call Haley Barrett at [phone] and select option# 1.     - Please use the  following information to access the peer to peer. Haley Barrett, Haley Barrett TRACKING#: [id number] MEMBER ID# [id number] Jetty Peeks Fax: [phone]    Thank You,   [facility] Admin III

## 2018-06-02 ENCOUNTER — Ambulatory Visit (INDEPENDENT_AMBULATORY_CARE_PROVIDER_SITE_OTHER): Payer: Medicaid Other | Admitting: Hospitalist

## 2018-06-02 ENCOUNTER — Telehealth (INDEPENDENT_AMBULATORY_CARE_PROVIDER_SITE_OTHER): Payer: Self-pay | Admitting: Orthopaedic Surgery

## 2018-06-02 ENCOUNTER — Encounter (INDEPENDENT_AMBULATORY_CARE_PROVIDER_SITE_OTHER): Payer: Medicaid Other

## 2018-06-02 ENCOUNTER — Encounter (INDEPENDENT_AMBULATORY_CARE_PROVIDER_SITE_OTHER): Payer: Self-pay | Admitting: Hospitalist

## 2018-06-02 VITALS — BP 121/81 | HR 73 | Temp 97.2°F | Resp 16 | Ht 70.0 in | Wt 168.2 lb

## 2018-06-02 DIAGNOSIS — Z1331 Encounter for screening for depression: Secondary | ICD-10-CM

## 2018-06-02 DIAGNOSIS — Z1339 Encounter for screening examination for other mental health and behavioral disorders: Secondary | ICD-10-CM

## 2018-06-02 DIAGNOSIS — Z1389 Encounter for screening for other disorder: Secondary | ICD-10-CM

## 2018-06-02 DIAGNOSIS — M5116 Intervertebral disc disorders with radiculopathy, lumbar region: Secondary | ICD-10-CM

## 2018-06-02 MED ORDER — OXYCODONE-ACETAMINOPHEN 5-325 MG OR TABS
1.0000 | ORAL_TABLET | Freq: Two times a day (BID) | ORAL | 0 refills | Status: DC | PRN
Start: 2018-06-02 — End: 2018-06-02

## 2018-06-02 MED ORDER — DULOXETINE HCL 30 MG OR CPEP
ORAL_CAPSULE | ORAL | 2 refills | Status: DC
Start: 2018-06-02 — End: 2018-07-11

## 2018-06-02 NOTE — Progress Notes (Signed)
ATTENDING NOTE:    Chief complaint: Back pain    SUBJECTIVE:  I reviewed the history and interviewed the patient.    History of present illness: Haley Barrett is a 37 year old woman with ADD and spinal stenosis/degenerative disease who presents for follow-up of low back pain with radiculopathy.    Last seen by Dr. Renaldo Reel 05/17/2018    Has chronic low back pain due to spinal stenosis.  Has had lumbar ESI with Dr. Cherly Hensen and seen Orthopedics, who recommended non-surgical treatment at this point.      Review of Systems: As per resident's note.    Past Medical, Family, Social History: Reviewed and updated as per resident note.    OBJECTIVE: BP 121/81 (BP Location: Left arm, BP Patient Position: Sitting, BP cuff size: Regular)   Pulse 73   Temp 97.2 F (36.2 C) (Temporal)   Resp 16   Ht 5\' 10"  (1.778 m)   Wt 76.3 kg (168 lb 3.2 oz)   LMP 05/17/2018   SpO2 99%   BMI 24.13 kg/m     Well-appearing, non-toxic, NAD  TTP lumbar paraspinal area  Negative bilateral straight leg raise    I have examined the patient and I concur with the resident's exam.    MEDICAL PLAN of CARE:  Assessment and plan reviewed with the resident physician.  I agree with the resident's plans as documented.    #Back pain: Has known spinal stenosis with compatible symptoms.  Has been seeing Dr. Cherly Hensen in [facility] with some relief) and is seeing Orthopedics next week.  Has also been taking narcotics PRN - has not tried duloxetine or gabapentin yet.  CURES report run, no suspicious prescribing activity.  Oxycodone-acetaminophen last prescribed by Dr. Renaldo Reel and filled 05/17/2018 (#15).      - Follow-up in Pain Clinic with Dr. Cherly Hensen  - Follow-up with Dr. Christa See 06/08/2018  - Continue oxycodone-acetaminophen 5-325mg  BID PRN while she is being assessed for definitive treatment of the underlying cause of her pain  - Start duloxetine 30mg  daily and increase to 60mg  after 7 days, if tolerating  - Consider trial of gabapentin (e.g  300mg  QHS)     See the resident's note for further details.    Gertie Baron, MD, MPH  Assistant Clinical Professor of Medicine   4th and [facility], [facility]

## 2018-06-02 NOTE — Telephone Encounter (Signed)
Pt has been notified that Dr Cherly Hensen is currently working on this and WE will contact her with a reply. Closing encounter as  There are numerous encounters open about the same issue

## 2018-06-02 NOTE — Telephone Encounter (Signed)
From: [name]  To: Adelfa Koh, MD  Sent: 06/01/2018 12:49 PM PDT  Subject: 3-Referral Request Status    Hi Doctor Cherly Hensen,   I have forwarded this along from [place] in the hopes it gets to you with some more information the operators asked for: I also got the fax number for [facility] which is [phone]    Golda Acre  06/01/2018 10:58 AM  PrintDelete  [facility] Denial Information  Good morning,    Below is the following information for the denials per your request.    Dr. Cherly Hensen:    [facility] has denied the request for the MRI LUMBAR SPINE W/O CONTRAST, MRI PELVIS BONE W/O CONTRAST CPT 72148, 09811 to be performed on DOS 06/06/2018. Berkley Harvey has been denied due to no recent ultrasound, xray, no lump or mass, and no pain.    To overturn this decision, either yourself; a nurse practitioner, or physician's assistant may call [facility] Redo at [phone] and select option# 1. Please use the following information to access the peer to peer. TEZRA, MAHR MRN: [id number] DOB: [birth year] TRACKING#: [id number] MEMBER ID# [id number] F)      Thank You,  [facility] Admin III

## 2018-06-02 NOTE — Patient Instructions (Addendum)
We have prescribed the cymbalta.  You can increase it in 7 days from 30 mg daily.  Please take the tylenol 975 every 8 hours regardless of if you have pain.    [facility] [place]

## 2018-06-02 NOTE — Telephone Encounter (Signed)
-----   Message from Golda Acre sent at 06/01/2018  1:25 PM PDT -----  Regarding: RE: Peer to Peer  Contact: [phone]  Hello Dr. Cherly Hensen,    I do apologize for so many mesages. Per patient request I am sending you a request for the peer to peer to be done today. The patient is concerned for her appointment on DOS: 06/06/2018.    Thank you,  ----- Message -----  From: Adelfa Koh, MD  Sent: 06/01/2018  12:04 PM  To: Golda Acre  Subject: RE: Peer to Peer                                 Please let the insurance company know that it is not true that the patient 'has no pain'.    Thanks,  Dr Cherly Hensen    ----- Message -----  From: Golda Acre  Sent: 06/01/2018  10:01 AM  To: Adelfa Koh, MD  Subject: Peer to Peer                                     Dr. Cherly Hensen:    [facility] has denied the request for the MRI LUMBAR SPINE W/O CONTRAST, MRI PELVIS BONE W/O CONTRAST CPT 72148, 09811 to be performed on DOS 06/06/2018. Berkley Harvey has been denied due to no recent ultrasound, xray, no lump or mass, and no pain.    To overturn this decision, either yourself; a nurse practitioner, or physician's assistant may call [facility] at [phone] and select option# 1. Please use the following information to access the peer to peer. CAMYLLE, WHICKER MRN: [id number] DOB: [birth year] TRACKING#: [id number] MEMBER ID# [id number] F)    Thank You,  [phone]  Patient Access Dept.  [phone]

## 2018-06-02 NOTE — Telephone Encounter (Signed)
Phone call initiated 9:14am, placed on hold, left phone number on recorded message, phone call terminated 9:34 am.

## 2018-06-02 NOTE — Progress Notes (Signed)
Chief Complaint   Patient presents with   . Back Pain       SUBJECTIVE::Haley Barrett is a 37 year old female here for follow-up. Problem list was updated and reviewed today with patient:    Patient with chronic LBP.  Has already been started on norco and percocet and now feels she can not manage pain without them.  Pain still persistent and bothersome throughout the day but she feels pain scores improved enough with the narcotics to justify them.  Willing to try other non narcotic medications (which she does not think will help) but feels ultimate solution to get off narcotics will be back surgery.  LBP has occassional shooting down to left knee.  No loss of sensation, weakness, bowel or bladder incontinence.      Patient Active Problem List    Diagnosis Date Noted   . Lumbar disc disease with radiculopathy 06/02/2018   . Lumbar spondylosis 03/28/2018     Added automatically from request for surgery [id number]     . Chronic bilateral low back pain, with sciatica presence unspecified 03/04/2018   . Finger dislocation, subsequent encounter 12/23/2015   . ADD (attention deficit disorder) 04/06/2013   . Birth control 04/06/2013       Past medical history and family history reviewed and updated today as below.  Allergies and medications reviewed and updated as below.    Past Medical History:   Diagnosis Date   . ADD (attention deficit disorder)    . Clavicular fracture        Social History:  Social History     Tobacco Use   . Smoking status: Never Smoker   . Smokeless tobacco: Never Used   Substance Use Topics   . Alcohol use: Yes     Comment: socially, couple drinks/week    . Drug use: Yes     Types: Marijuana     Comment: rare marijuana     Social History     Social History Narrative    Single    Psychotherapist - just beginning, in Artist. Went to school in [place].    Born in [place] and moved to [place] 6 years ago    Tour guide-food and history tours     Hobbies: beach volleyball, horse back riding,  exercise        Allergies:  Allergies   Allergen Reactions   . Tramadol Nausea and Vomiting       Current Outpatient Medications   Medication Sig   . Amphetamine-Dextroamphetamine (ADDERALL PO) as needed.   . diclofenac (VOLTAREN) 1 % gel Apply 3 g topically 4 times daily.   . drospirenone-ethinyl estradiol (YAZ) 3-0.02 MG tablet Take 1 tablet by mouth daily.   . DULoxetine (CYMBALTA) 30 MG CR capsule Please start 30 mg daily for 7 days and then increase to 60 mg daily after a week.   . lidocaine (LIDODERM) 5 % patch Apply 1 patch topically every 24 hours. Leave patch on for 12 hours, then remove for 12 hours.   . naproxen (NAPROSYN) 500 MG tablet Take 1 tablet (500 mg) by mouth 2 times daily as needed (pain). Take with meals.   Marland Kitchen oxyCODONE-acetaminophen (PERCOCET) 5-325 MG tablet Take 1 tablet by mouth every 6 hours as needed for Severe Pain (Pain Score 7-10).   Marland Kitchen zolpidem (AMBIEN) 10 MG tablet Take 1 tablet (10 mg) by mouth nightly as needed for Insomnia.     No current facility-administered medications for  this visit.        ROS:    Constitutional: negative for fever, chills, weight loss or weight gain  CV: negative for chest pain, palpitations, LEE  Respiratory: negative for SOB, DOE, PND, orthopnea  GI: negative for nausea, vomiting, abdominal pain    OBJECTIVE:: BP 121/81 (BP Location: Left arm, BP Patient Position: Sitting, BP cuff size: Regular)   Pulse 73   Temp 97.2 F (36.2 C) (Temporal)   Resp 16   Ht 5\' 10"  (1.778 m)   Wt 76.3 kg (168 lb 3.2 oz)   LMP 05/17/2018   SpO2 99%   BMI 24.13 kg/m   General Appearance: healthy, alert, no distress  Psych: pleasant affect, cooperative.  Heart:  normal rate and regular rhythm, no murmurs, clicks, or gallops.  Lungs: clear to auscultation.  Normal respiratory effort.  Abdomen: BS normal.  Abdomen soft, non-tender.  No masses or hepatomegaly.  Musculoskeletal:  no edema.    Labs and chart reviewed.    A/P:  Haley Barrett was seen today for back pain.    Diagnoses  and all orders for this visit:    Screened negative for depression    Lumbar disc disease with radiculopathy- will do one narcotic with percocet and d/c norco.  Gave instructions to not drive with.  This is transitional until we have better control with ESI and possibly with surgery but if there is not surgery, explained that we will try to come off.  Starting duloxetine as a non narcotic agent and to assist with titration.  30 tabs for 30 days given of percocet.  I ran cures which showed the initial narcotic prescription in July and regular refills by [facility] and [facility] since.  Also with amphetamines and zolpidem.    -     Discontinue: oxyCODONE-acetaminophen (PERCOCET) 5-325 MG tablet; Take 1 tablet by mouth every 12 hours as needed for Severe Pain (Pain Score 7-10).  -     DULoxetine (CYMBALTA) 30 MG CR capsule; Please start 30 mg daily for 7 days and then increase to 60 mg daily after a week.    Screened negative for alcohol use    Screened negative for drug use    Other orders  -     CURES Review Documentation - I Reviewed CURES    Return in about 4 weeks (around 06/30/2018).    Patient seen by and plan discussed with Dr. Lauralyn Primes.    Meredeth Ide, PGY3, IM

## 2018-06-03 NOTE — Telephone Encounter (Signed)
Closing duplicate email as Dr Cherly Hensen is working on this and pt has been informed

## 2018-06-03 NOTE — Telephone Encounter (Signed)
From: [name]  To: Adelfa Koh, MD  Sent: 06/01/2018 10:36 AM PDT  Subject: 3-Referral Request Status    Hi Doctor Cherly Hensen,    My insurance as usual is requesting a peer to peer before they will clear my MRIS. I'm hoping you can manage to get to it this week as otherwise my appointment Monday will be cancelled. Clifton Custard is the one I spoke to and he gave me this number to call [facility]: [phone]

## 2018-06-06 ENCOUNTER — Ambulatory Visit (HOSPITAL_BASED_OUTPATIENT_CLINIC_OR_DEPARTMENT_OTHER): Payer: Medicaid Other

## 2018-06-06 ENCOUNTER — Encounter (INDEPENDENT_AMBULATORY_CARE_PROVIDER_SITE_OTHER): Payer: Medicaid Other | Admitting: Internal Medicine

## 2018-06-08 ENCOUNTER — Ambulatory Visit (INDEPENDENT_AMBULATORY_CARE_PROVIDER_SITE_OTHER): Payer: Medicaid Other | Admitting: Orthopaedic Surgery of the Spine

## 2018-06-08 VITALS — BP 114/80 | HR 91 | Temp 97.7°F | Ht 70.0 in | Wt 168.0 lb

## 2018-06-08 DIAGNOSIS — M4310 Spondylolisthesis, site unspecified: Principal | ICD-10-CM

## 2018-06-08 DIAGNOSIS — M5416 Radiculopathy, lumbar region: Secondary | ICD-10-CM

## 2018-06-08 DIAGNOSIS — M48061 Spinal stenosis, lumbar region without neurogenic claudication: Secondary | ICD-10-CM

## 2018-06-08 NOTE — Progress Notes (Signed)
ORTHOPAEDIC SPINE SURGERY     Visit Type: Follow up Visit    Requesting Provider: No ref. provider found    Reason for Visit: Low Back Pain (Pt. here for follow up. She has appt this friday to get her MRI's done)        History Of Present Illness: 37 year old female returns to spine clinic for initial evaluation of complaints of low back pain. She is here with her mother to review and discuss options. She was unable to obtain new MRI lumbar spine. She notes ongoing complaints of low back pain and radiating leg pain and paresthesias, which remains significantly activity limiting. To review briefly, she notes longstanding and worsening progression of low back pain and notably severe left posterior leg pain. Pain is worse especially over the past 6 months during which time she has been severely limited and nearly bed ridden on account of her low back pain and her leg pain. 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 She states that over the past six months she is unable to stand or walk for any prolonged period of time and has been unable to return to regular activities or working out or volleyball. She denies any focal motor deficits or weakness. Pain is worse with activity and relieved with rest.  Patient rates her pain as 5, largely improved after recent medrol dose pack she had been provided by outside provider.  She has had extensive course of conservative management including time, activity modification, pain medications, PT and injections in addition to chiropractic, massage and acupuncture without any sustained relief, and with progression. Patient reports no changes in bowel and bladder function.    Allergies:   Allergies   Allergen Reactions   . Tramadol Nausea and Vomiting       Medications:   Current Outpatient Medications   Medication Sig   . Amphetamine-Dextroamphetamine (ADDERALL PO) as needed.   .  diclofenac (VOLTAREN) 1 % gel Apply 3 g topically 4 times daily.   . drospirenone-ethinyl estradiol (YAZ) 3-0.02 MG tablet Take 1 tablet by mouth daily.   . DULoxetine (CYMBALTA) 30 MG CR capsule Please start 30 mg daily for 7 days and then increase to 60 mg daily after a week.   . lidocaine (LIDODERM) 5 % patch Apply 1 patch topically every 24 hours. Leave patch on for 12 hours, then remove for 12 hours.   . naproxen (NAPROSYN) 500 MG tablet Take 1 tablet (500 mg) by mouth 2 times daily as needed (pain). Take with meals.   Marland Kitchen oxyCODONE-acetaminophen (PERCOCET) 5-325 MG tablet Take 1 tablet by mouth every 6 hours as needed for Severe Pain (Pain Score 7-10).   Marland Kitchen zolpidem (AMBIEN) 10 MG tablet Take 1 tablet (10 mg) by mouth nightly as needed for Insomnia.     No current facility-administered medications for this visit.                                 Past Medical History:  has a past medical history of ADD (attention deficit disorder) and Clavicular fracture.    Past Surgical History:  has a past surgical history that includes ------------other------------- (37 yo).    Social History:  reports that she has never smoked. She has never used smokeless tobacco. She reports that she drinks alcohol. She reports that she has  current or past drug history. Drug: Marijuana.    Family History: family history includes Breast Cancer in her paternal aunt and paternal grandmother; Breast Cancer (age of onset: 79) in her mother; Diabetes in her paternal uncle.    Review of Systems:  CONSTITUTIONAL: No unexpected weight loss, fevers, chills, or anorexia. SKIN: Noncontributory. EARS, NOSE AND THROAT: Noncontributory. EYES: Noncontributory. LUNGS: Noncontributory. CARDIAC: Noncontributory. GASTROINTESTINAL: Noncontributory. GENITOURINARY: Symptoms as noted above in HPI. NEUROLOGIC: Symptoms as noted above in HPI. MUSCULOSKELETAL: Symptoms as noted above in HPI.    Physical Examination:  BP 114/80 (BP Location: Left arm, BP Patient  Position: Sitting, BP cuff size: Regular)   Pulse 91   Temp 97.7 F (36.5 C) (Oral)   Ht 5\' 10"  (1.778 m)   Wt 76.2 kg (168 lb)   LMP 05/17/2018   BMI 24.11 kg/m   CONSTITUTIONAL: Well appearing and well groomed. PSYCHOLOGICAL: Alert and oriented and appropriate to situation. INTEGUMENT: Intact. VASCULAR: Radial and pedal pulses are intact and symmetric. EXTREMITIES: Bilateral upper and lower extremities have good range of motion and no significant deformities. GAIT: Brisk with good coordination.     CERVICAL SPINE: Nontender to palpation. Spurling's test negative. Sensation intact of the upper extremities.    RANGE OF MOTION (in degrees)       RT LT  Lateral motion   80  80  Lateral bend   45  45  Flexion 40 degrees. Extension 35 degrees. Motion in all planes is nonpainful.    MOTOR    RT LT  Trapezius   5/5  5/5  Deltoid    5/5  5/5  Bicep    5/5  5/5  Tricep    5/5  5/5  Wrist flexor   5/5  5/5  Wrist extensor  5/5  5/5  Intrinsics   5/5  5/5  Grip    5/5  5/5    DEEP TENDON REFLEXES       RT  LT  Bicep    2+  2+  Brachioradialis  2+  2+  Tricep    2+  2+    THORACIC SPINE  Nontender to percussion. Alignment - normal, no paravertebral muscle fullness. Sensation intact. Beevor's negative. Abdominal reflexes are absent and symmetric.     LUMBAR SPINE  Nontender to percussion. Heel to toe walk without deficit. Sensation intact of the lower extremities. Trendelenburg sign negative. Skin intact.    RANGE OF MOTION        RT  LT  lateral bend, normal at 25 degrees 25  25  rotation, normal at 30 degrees 30  30  Flexion - normal at 40 degrees. Extension - normal at 10 degrees.    MOTOR STRENGTH       RT  LT  Iliopsoas   5/5  5/5  Quadricep   5/5  5/5  Anterior tibialis   5-/5  5-/5  Extensor hallucis longus 5-/5  5-/5  Gastrocsoleus   5/5  5/5    DEEP TENDON REFLEXES       RT  LT  Patellar   2+  2+  Achilles   2+  2+    PATHOLOGIC  REFLEXES      RT LT  Clonus    Neg Neg  Babinski   Neg Neg  Hoffmans   Neg Neg    STRAIGHT LEG RAISING       RT  LT  Sitting @ 90 degrees   Neg Neg  IMAGING STUDIES:   Plain radiographs lumbar spine shows:  IMPRESSION:  Degenerative spondylolisthesis with progression of severity of spondylolisthesis by 3-4 mm compared to prior imaging, with segmental kyphosis. Mild to moderate lower lumbar facet arthropathy with alignment abnormalities as described.    MRI lumbar spine (outside CD, uploaded in PACS) shows facet hypertrophy with crescent sign L4-5 with resultant severe lateral recess, foraminal and central stenosis in the setting of degenerative spondylolisthesis, largely reduced on supine imaging.    OTHER MEDICAL RECORDS/IMAGING STUDIES REVIEWED: None    IMPRESSION:   Degenerative spondylolisthesis, lumbar stenosis with radiculopathy    PLAN/DISCUSSION: Reviewed clinical history as well as exam results and imaging findings with patient and her mother in detail. Imaging again shows progression of spondylolisthesis and likely stenosis L4-5 which does correlate with the distribution of her symptoms, though we are pending new imaging given the progression and marked change in her symptoms, but are awaiting completion of new study this week. We discussed options in detail.  She has had extensive and exhaustive conservative management in the form of time, activity modification, pain medications, PT and injections with only transient and minimal relief of symptoms, and with ongoing progression. We discussed at length the mechanical and structural nature of her symptoms and she is interested in proceeding with definitive surgical address. This would involve XLIF L4-5 with completion of PSIF L4-5, possible lamy.  We have discussed the planned procedure as well as r/b/a in detail. I have described the surgery / procedure in detail.  All the patient's questions were answered. Glenora Morocho understood the possible  complications of surgery /  procedure which include but are not limited to: ongoing pain, numbness in the extremities, weakness, nerve injury, dural tear, infection, paralysis, spinal cord injury, need for further surgery, failure of fusion, possible need for reoperation if the fusion fails, instrumentation failure to include the need for repositioning of the implant, breakage, prolonged recuperation, bleeding, need for blood transfusions which includes the risk of a blood reaction, infection or disease, medical complications including pneumonia, prolonged intubation requiring respiratory support, tracheostomy, heart attack, stroke, deep vein thrombosis, pulmonary embolism, blood vessel injury, anesthesia complications and even death.  There is a risk of unsuccessful results from either foreseen or unforeseen causes. Having understood the risks and benefits the patient asked [place] to proceed with surgery / procedure. She will follow up with imaging this Friday and contact us to let us know when it uploaded into PACS.  We spent 45 minutes with this patient. More than 50 percent of this time was spent discussing the diagnosis and the treatment plan. All questions were answered and Makia Bossi understood and was satisfied with this plan.     FOLLOWUP: Return to clinic for post op check. The patient is encouraged to call us with any questions or problems in the interim. Our contact numbers were given to the patient.

## 2018-06-09 ENCOUNTER — Encounter (INDEPENDENT_AMBULATORY_CARE_PROVIDER_SITE_OTHER): Payer: Medicaid Other | Admitting: Hospitalist

## 2018-06-09 ENCOUNTER — Ambulatory Visit (INDEPENDENT_AMBULATORY_CARE_PROVIDER_SITE_OTHER): Payer: Medicaid Other | Admitting: Hospitalist

## 2018-06-13 ENCOUNTER — Other Ambulatory Visit: Payer: Self-pay

## 2018-06-13 ENCOUNTER — Telehealth (INDEPENDENT_AMBULATORY_CARE_PROVIDER_SITE_OTHER): Payer: Self-pay | Admitting: Orthopaedic Surgery of the Spine

## 2018-06-13 NOTE — Telephone Encounter (Signed)
Pt dropped off MRI disc to be uploaded, this has been completed. Pt will fax the report over at a later time and it will be scanned into chart.  This is an Financial planner.  Thank you.

## 2018-06-15 ENCOUNTER — Encounter (INDEPENDENT_AMBULATORY_CARE_PROVIDER_SITE_OTHER): Payer: Self-pay | Admitting: Orthopaedic Surgery of the Spine

## 2018-06-16 NOTE — Telephone Encounter (Signed)
From: Haley Barrett  To: Zlomislic, Vinko, MD  Sent: 06/15/2018 10:09 AM PDT  Subject: 2-Procedural Question    Hi Doctor Z, I think [name] sent you a message but just wanted to let you know I did drop off my MRI info on Monday. Just waiting for [name] to get back to me about scheduling. The radiologist said he didn't see anything new.    Thanks,  [name]

## 2018-06-17 NOTE — Telephone Encounter (Signed)
Returned pt call and discussed scheduling surgery for 11/7. Pt will confirm with family and get back with me if date is ok to proceed. Pt is tentatively scheduled for 11/7 with Dr. Herma Carson for lumbar fusion. Pt informed Berkley Harvey will be submitted today to [facility]. Will f/u on auth status next week.

## 2018-06-27 ENCOUNTER — Ambulatory Visit (HOSPITAL_BASED_OUTPATIENT_CLINIC_OR_DEPARTMENT_OTHER): Payer: Medicaid Other

## 2018-06-28 ENCOUNTER — Encounter (INDEPENDENT_AMBULATORY_CARE_PROVIDER_SITE_OTHER): Payer: Self-pay | Admitting: Student in an Organized Health Care Education/Training Program

## 2018-06-28 ENCOUNTER — Ambulatory Visit (INDEPENDENT_AMBULATORY_CARE_PROVIDER_SITE_OTHER): Payer: Medicaid Other | Admitting: Student in an Organized Health Care Education/Training Program

## 2018-06-28 VITALS — BP 120/81 | HR 81 | Temp 98.0°F | Resp 16 | Ht 70.0 in | Wt 170.6 lb

## 2018-06-28 DIAGNOSIS — Z23 Encounter for immunization: Secondary | ICD-10-CM

## 2018-06-28 DIAGNOSIS — Z833 Family history of diabetes mellitus: Secondary | ICD-10-CM

## 2018-06-28 DIAGNOSIS — Z0000 Encounter for general adult medical examination without abnormal findings: Secondary | ICD-10-CM

## 2018-06-28 DIAGNOSIS — Z7689 Persons encountering health services in other specified circumstances: Secondary | ICD-10-CM

## 2018-06-28 NOTE — Interdisciplinary (Signed)
Influenza vaccine administered.  Patient confirmed allergies,  name and date of birth.  Patient tolerated very well. Vaccine Information Statement-VIS given to patient.  Patient educated on s/s of allergic reaction.  Patient advised to wait in clinic lobby 15 mins for s/s of adverse reaction and alert clinic staff immediately.  Patient advised to call with any questions/concerns.  Patient verbalize good understanding.

## 2018-06-28 NOTE — Patient Instructions (Signed)
Go to the laboratory to have blood work done

## 2018-06-28 NOTE — Progress Notes (Signed)
ATTENDING NOTE:    Chief Complaint   Patient presents with   . Referral/authorization     neurology   . Other     bloodwork        SUBJECTIVE:   I reviewed the history and interviewed the patient.    History of present illness:  Haley Barrett is a 37 year old female who presents for f/u chronic back pain on Percocet.  C/o hand numbness that started a month ago.     Review of Systems: As per resident's note.    Past Medical, Family, Social History: Reviewed and updated as per resident note.    OBJECTIVE: BP 120/81 (BP Location: Right arm, BP Patient Position: Sitting, BP cuff size: Large)   Pulse 81   Temp 98 F (36.7 C) (Tympanic)   Resp 16   Ht 5\' 10"  (1.778 m)   Wt 77.4 kg (170 lb 9.6 oz)   SpO2 99%   BMI 24.48 kg/m     I have examined the patient and I concur with the resident's exam.    MEDICAL PLAN of CARE:   Assessment and plan reviewed with the resident physician.  I agree with the resident's plans as documented.    Pt wants to be seen by an outside physician in [place] for her back pain.   If hand numbness persists, consider cervical spine imaging.   HIV screening per pt request.   Flu shot today.     See the resident's note for further details.

## 2018-06-30 ENCOUNTER — Encounter (INDEPENDENT_AMBULATORY_CARE_PROVIDER_SITE_OTHER): Payer: Self-pay | Admitting: Student in an Organized Health Care Education/Training Program

## 2018-06-30 ENCOUNTER — Encounter (INDEPENDENT_AMBULATORY_CARE_PROVIDER_SITE_OTHER): Payer: Self-pay | Admitting: Hospitalist

## 2018-06-30 ENCOUNTER — Encounter (INDEPENDENT_AMBULATORY_CARE_PROVIDER_SITE_OTHER): Payer: Medicaid Other | Admitting: Nurse Practitioner

## 2018-07-01 ENCOUNTER — Encounter (INDEPENDENT_AMBULATORY_CARE_PROVIDER_SITE_OTHER): Payer: Self-pay | Admitting: Student in an Organized Health Care Education/Training Program

## 2018-07-01 NOTE — Telephone Encounter (Signed)
My chart message responded to patient.  Closing encounter.

## 2018-07-01 NOTE — Telephone Encounter (Signed)
From: [name]  To: Meredeth Ide, MD  Sent: 06/30/2018 9:02 PM PDT  Subject: 3-Referral Request Status    Hi Doctor Gerri Lins,    They sent me in to another doctor who doesn't even pop up on mychart. He ordered some bloodwork in part at my request but I'm assuming accidentally wrote it to be completed by that day... The labs were already closed for the day when he ordered it. Could someone please redo this order?    Thank you,  [name]

## 2018-07-01 NOTE — Telephone Encounter (Signed)
From: [name]  To: Meredeth Ide, MD  Sent: 06/30/2018 9:06 PM PDT  Subject: 2-Procedural Question    Oh and sorry I did just find his name on mychart. It was Doctor Burna Mortimer... but in either case could someone please redo that order for bloodwork with enough time for me to actually complete it?     Thank you,  [name]

## 2018-07-01 NOTE — Telephone Encounter (Signed)
Addressed in separate encounter, duplicate. closing

## 2018-07-01 NOTE — Telephone Encounter (Signed)
From: [name]  To: Burna Mortimer, MD  Sent: 06/30/2018 9:04 PM PDT  Subject: 3-Referral Request Status    Hi Dr. Marquis Lunch,    I saw you on Tuesday and you had ordered some bloodwork done. Could you please re-do the order? You wrote it to be completed for the same day and the clinic had already been closed when you wrote it so there was no way I would have been able to get it done. If you could please make the request for some time out so that I have time to do it that would be helpful.    Thank you,  [name]

## 2018-07-01 NOTE — Telephone Encounter (Signed)
From: [name]  To: Burna Mortimer, MD  Sent: 07/01/2018 11:03 AM PDT  Subject: 3-Referral Request Status    Carol Ada, they just didn't show up on my [place] chart and it says they should be done by 10/22. Thanks.  ----- Message -----  From: Gerlene Fee  Sent: 07/01/2018 9:44 AM PDT  To: [name]  Subject: RE: 3-Referral Request Status  Good morning Radley,    Your labs are active and can be drawn at your convenience. Below please find lab hours of operation.      [facility] Laboratory Hours   7:30AM to 4:15PM  Please check-in in the 3rd floor ([address]) before heading to the lab.     The [facility] Medicine Lab is located at: (4th floor)  [address] [place] [address] [place], [place] [address]  [phone]            ----- Message -----   From: [name]   Sent: 06/30/2018 9:04 PM PDT   To: Burna Mortimer, MD  Subject: 3-Referral Request Status    Hi Dr. Marquis Lunch,    I saw you on Tuesday and you had ordered some bloodwork done. Could you please re-do the order? You wrote it to be completed for the same day and the clinic had already been closed when you wrote it so there was no way I would have been able to get it done. If you could please make the request for some time out so that I have time to do it that would be helpful.    Thank you,  [name]

## 2018-07-01 NOTE — Telephone Encounter (Signed)
Closing encounter

## 2018-07-04 ENCOUNTER — Ambulatory Visit (INDEPENDENT_AMBULATORY_CARE_PROVIDER_SITE_OTHER): Payer: Medicaid Other | Admitting: Internal Medicine

## 2018-07-06 ENCOUNTER — Encounter (HOSPITAL_BASED_OUTPATIENT_CLINIC_OR_DEPARTMENT_OTHER): Payer: Self-pay | Admitting: Orthopaedic Surgery of the Spine

## 2018-07-06 NOTE — Progress Notes (Signed)
Chief Complaint   Patient presents with   . Referral/authorization     neurology   . Other     bloodwork        SUBJECTIVE/HPI::Haley Barrett is a 37 year old female with chronic LBP here for acute visit.    Wants to see a physician in [place] for research study related to her back pain--requesting referral for insurance purposes. Still has oxycodone-acetaminophen left over from previous visit. Some days, she does not use this medication and other days she will take it twice. Has tried physical activity and topical treatments which is helping with the pain. Scheduled to see the orthopedic surgeon soon--planning for surgery on November 21.    Reports left hand numbness that started one month ago. Happens intermittently. Does not worsen with activity--denies shoulder, elbow, or forearm numbness. Does not type frequently. Symptoms are not very bothersome but she is concerned that her symptoms are potentially related to the lumbar spine. Of note, she denies neck pain, recent trauma, finger weakness, and bilateral symptoms.    Also requesting HIV testing for "work purposes". Denies next sexual partners. No fevers, chills, rash, diarrhea, or neck swelling.    ============================================================  ROS:     Constitutional: negative for fever, chills, weight loss or weight gain  CV: negative for chest pain, palpitations, LEE  Respiratory: negative for SOB, DOE, PND, orthopnea  GI: negative for nausea, vomiting, abdominal pain  Neuro: + left hand numbness over palmar surface--not localized to any particular dermatomal distribution, no sensory changes on the shoulder, forearm, or elbow  ============================================================  Patient Active Problem List   Diagnosis   . ADD (attention deficit disorder)   . Birth control   . Finger dislocation, subsequent encounter   . Chronic bilateral low back pain, with sciatica presence unspecified   . Lumbar spondylosis   . Lumbar disc disease  with radiculopathy   . Degenerative spondylolisthesis   . Spinal stenosis of lumbar region with radiculopathy         ============================================================  Past medical history and family history reviewed; updated today as below.      Allergies and medications reviewed; updated as below.    ============================================================  Past Medical History:   Diagnosis Date   . ADD (attention deficit disorder)    . Clavicular fracture        ============================================================  Social History     Tobacco Use   . Smoking status: Never Smoker   . Smokeless tobacco: Never Used   Substance Use Topics   . Alcohol use: Yes     Comment: socially, couple drinks/week    . Drug use: Yes     Types: Marijuana     Comment: rare marijuana       ============================================================  Social History     Patient does not qualify to have social determinant information on file (likely too young).   Social History Narrative    Single    Psychotherapist - just beginning, in [employer]. Went to school in [place].    Born in [place] and moved to [place] 6 years ago    Tour guide-food and history tours     Hobbies: beach volleyball, horse back riding, exercise        Family History   Problem Relation Name Age of Onset   . Diabetes Paternal Uncle     . Breast Cancer Paternal Aunt     . Breast Cancer Mother  70   . Breast Cancer Paternal  Grandmother     . Colon Cancer Neg Hx     . Hypertension Neg Hx     . Cholesterol/Lipid Disorder Neg Hx           Past Surgical History:   Procedure Laterality Date   . ------------OTHER-------------  37 yo    humerus ORIF        ============================================================  Allergies   Allergen Reactions   . Tramadol Nausea and Vomiting       ============================================================  Current Outpatient Medications   Medication Sig   . Amphetamine-Dextroamphetamine  (ADDERALL PO) as needed.   . diclofenac (VOLTAREN) 1 % gel Apply 3 g topically 4 times daily.   . drospirenone-ethinyl estradiol (YAZ) 3-0.02 MG tablet Take 1 tablet by mouth daily.   . DULoxetine (CYMBALTA) 30 MG CR capsule Please start 30 mg daily for 7 days and then increase to 60 mg daily after a week.   . lidocaine (LIDODERM) 5 % patch Apply 1 patch topically every 24 hours. Leave patch on for 12 hours, then remove for 12 hours.   . naproxen (NAPROSYN) 500 MG tablet Take 1 tablet (500 mg) by mouth 2 times daily as needed (pain). Take with meals.   Marland Kitchen oxyCODONE-acetaminophen (PERCOCET) 5-325 MG tablet Take 1 tablet by mouth every 6 hours as needed for Severe Pain (Pain Score 7-10).   Marland Kitchen zolpidem (AMBIEN) 10 MG tablet Take 1 tablet (10 mg) by mouth nightly as needed for Insomnia.     No current facility-administered medications for this visit.        ============================================================  OBJECTIVE::   BP 120/81 (BP Location: Right arm, BP Patient Position: Sitting, BP cuff size: Large)   Pulse 81   Temp 98 F (36.7 C) (Tympanic)   Resp 16   Ht 5\' 10"  (1.778 m)   Wt 77.4 kg (170 lb 9.6 oz)   SpO2 99%   BMI 24.48 kg/m        Physical Exam:  General: appears stated age; sitting comfortably; no acute distress.  HEENT: oropharynx clear without erythema or exudates; pupils equal, round, and reactive to light; extraocular muscles intact; no lymphadenopathy .  Heart:  Regular rate and rhythm; +S1/S2; no murmurs, rubs, or gallops.  Lungs: clear to auscultation bilaterally; normal respiratory effort; no wheezes, crackles, or rhonchi.  Abdomen: soft, non-tender, and non-distended; no rebound or guarding; no hepatomegaly; no masses.  MSK: no clubbing, cyanosis, or edema; 2+ pulses. No weakness finger adduction/abduction, normal grip strength, able to make OK sign, no appreciable sensory loss on my exam, negative Phalen and Tinel sign, no neck tenderness, no neck pain when rotating head to the  left or right, no pain with neck extension/flexion    Labs and chart reviewed.    ============================================================  A/P:    Assessment:     Ms. Foland is a 37 y/o female with PMH chronic LBP who presents for f/u.    HIV testing as per patient request    Hand numbness not consistent with central process due to absence of weakness on exam, normal reflexes, and no recent trauma/neck pain to suggest cervical issue. Symptoms are also not localized to a specific dermatome. Does not work in a job that would predispose her to have carpal tunnel and provocative tests negative for this. Continue to monitor for now. If symptoms persist, will consider working up cervical issues vs peripheral neuropathy with EMG. Reassuring that muscle strength and functionality is preserved.      Plan:  Jovita was seen today for referral/authorization and other.    Diagnoses and all orders for this visit:    Family history of diabetes mellitus  -     Cancel: Glycosylated Hgb(A1C), Blood Lavender; Future  -     Glycosylated Hgb(A1C), Blood Lavender; Future    Need for vaccination  -     Flu Vaccine (6 months+)    Laboratory examination ordered as part of a routine general medical examination  -     HIV 1/2 Antibody & P24 Antigen Assay Yellow serum separator tube; Future  -     Cancel: Glycosylated Hgb(A1C), Blood Lavender; Future    Referral of patient  -     Other Clinic        Follow-up as previously scheduled with Dr. Gerri Lins    Patient verbalizes understanding and is agreeable to above plan.    Patient was seen and discussed with attending, Dr. Renaldo Reel

## 2018-07-07 ENCOUNTER — Other Ambulatory Visit (INDEPENDENT_AMBULATORY_CARE_PROVIDER_SITE_OTHER): Payer: Medicaid Other

## 2018-07-07 ENCOUNTER — Other Ambulatory Visit: Payer: Medicaid Other | Attending: Internal Medicine

## 2018-07-07 DIAGNOSIS — Z0000 Encounter for general adult medical examination without abnormal findings: Secondary | ICD-10-CM | POA: Insufficient documentation

## 2018-07-07 DIAGNOSIS — Z0189 Encounter for other specified special examinations: Secondary | ICD-10-CM

## 2018-07-07 DIAGNOSIS — Z833 Family history of diabetes mellitus: Secondary | ICD-10-CM | POA: Insufficient documentation

## 2018-07-07 DIAGNOSIS — Z5181 Encounter for therapeutic drug level monitoring: Principal | ICD-10-CM | POA: Insufficient documentation

## 2018-07-07 LAB — BASIC METABOLIC PANEL, BLOOD
Anion Gap: 15 mmol/L (ref 7–15)
BUN: 8 mg/dL (ref 6–20)
Bicarbonate: 22 mmol/L (ref 22–29)
Calcium: 9.3 mg/dL (ref 8.5–10.6)
Chloride: 100 mmol/L (ref 98–107)
Creatinine: 0.9 mg/dL (ref 0.51–0.95)
GFR: >60 mL/min
Glucose: 87 mg/dL (ref 70–99)
Potassium: 4.5 mmol/L (ref 3.5–5.1)
Sodium: 137 mmol/L (ref 136–145)

## 2018-07-07 LAB — GLYCOSYLATED HGB(A1C), BLOOD: Glyco Hgb (A1C): 5.1 % (ref 4.8–5.8)

## 2018-07-07 LAB — HIV 1/2 ANTIBODY & P24 ANTIGEN ASSAY, BLOOD: HIV 1/2 Antibody & P24 Antigen Assay: NONREACTIVE

## 2018-07-07 NOTE — Interdisciplinary (Signed)
Blood drawn from right arm with 23 gauge needle. 3 tubes taken.   Patient identity authenticated by Helen Grace Rasch.

## 2018-07-11 ENCOUNTER — Ambulatory Visit (INDEPENDENT_AMBULATORY_CARE_PROVIDER_SITE_OTHER): Payer: Medicaid Other | Admitting: Hospitalist

## 2018-07-11 ENCOUNTER — Ambulatory Visit: Payer: Medicaid Other | Attending: Orthopaedic Surgery of the Spine | Admitting: Family

## 2018-07-11 ENCOUNTER — Encounter (HOSPITAL_BASED_OUTPATIENT_CLINIC_OR_DEPARTMENT_OTHER): Payer: Self-pay | Admitting: Family

## 2018-07-11 ENCOUNTER — Other Ambulatory Visit: Payer: Medicaid Other | Attending: Family

## 2018-07-11 ENCOUNTER — Encounter (INDEPENDENT_AMBULATORY_CARE_PROVIDER_SITE_OTHER): Payer: Self-pay | Admitting: Hospitalist

## 2018-07-11 ENCOUNTER — Encounter (HOSPITAL_COMMUNITY): Payer: Self-pay | Admitting: Family

## 2018-07-11 VITALS — BP 110/70 | HR 71 | Temp 97.7°F | Resp 16 | Ht 70.0 in | Wt 174.2 lb

## 2018-07-11 VITALS — BP 125/78 | HR 70 | Temp 97.2°F | Resp 16 | Ht 70.0 in | Wt 173.0 lb

## 2018-07-11 DIAGNOSIS — Z01818 Encounter for other preprocedural examination: Secondary | ICD-10-CM | POA: Insufficient documentation

## 2018-07-11 DIAGNOSIS — M5442 Lumbago with sciatica, left side: Secondary | ICD-10-CM

## 2018-07-11 DIAGNOSIS — M4800 Spinal stenosis, site unspecified: Secondary | ICD-10-CM

## 2018-07-11 DIAGNOSIS — M5441 Lumbago with sciatica, right side: Secondary | ICD-10-CM

## 2018-07-11 DIAGNOSIS — G8929 Other chronic pain: Secondary | ICD-10-CM

## 2018-07-11 LAB — CBC WITH DIFF, BLOOD
ANC-Automated: 2.9 10*3/uL (ref 1.6–7.0)
Abs Basophils: 0.1 10*3/uL (ref <0.1)
Abs Eosinophils: 0.1 10*3/uL (ref 0.1–0.5)
Abs Lymphs: 4 10*3/uL — ABNORMAL HIGH (ref 0.8–3.1)
Abs Monos: 0.6 10*3/uL (ref 0.2–0.8)
Basophils: 1 %
Eosinophils: 1 %
Hct: 40.6 % (ref 34.0–45.0)
Hgb: 13.1 gm/dL (ref 11.2–15.7)
Lymphocytes: 52 %
MCH: 30.9 pg (ref 26.0–32.0)
MCHC: 32.3 g/dL (ref 32.0–36.0)
MCV: 95.8 um3 — ABNORMAL HIGH (ref 79.0–95.0)
MPV: 9.7 fL (ref 9.4–12.4)
Monocytes: 8 %
Plt Count: 329 10*3/uL (ref 140–370)
RBC: 4.24 10*6/uL (ref 3.90–5.20)
RDW: 12.3 % (ref 12.0–14.0)
Segs: 38 %
WBC: 7.6 10*3/uL (ref 4.0–10.0)

## 2018-07-11 LAB — TYPE & SCREEN
ABO/RH: O POS
Antibody Screen: NEGATIVE

## 2018-07-11 LAB — HEPATITIS B SURFACE AB, QUANT, BLOOD: HBsAb,Qt: 183.6 m[IU]/mL

## 2018-07-11 LAB — PROTHROMBIN TIME, BLOOD
INR: 0.9
PT,Patient: 9.9 s (ref 9.7–12.5)

## 2018-07-11 LAB — HEPATITIS B CORE AB TOTAL: HBcAb Total: NONREACTIVE

## 2018-07-11 LAB — HEPATITIS B SURFACE AG, BLOOD: HBsAg: NONREACTIVE

## 2018-07-11 LAB — ABO/RH CONFIRMATION: ABO/RH: O POS

## 2018-07-11 MED ORDER — OXYCODONE-ACETAMINOPHEN 5-325 MG OR TABS
1.00 | ORAL_TABLET | Freq: Four times a day (QID) | ORAL | 0 refills | Status: DC | PRN
Start: 2018-07-11 — End: 2018-08-15

## 2018-07-11 NOTE — Patient Instructions (Signed)
I sent the medications to your pharmacy.

## 2018-07-11 NOTE — Patient Instructions (Addendum)
PREOPERATIVE SURGICAL INFORMATION     Your surgery is currently scheduled at [facility] on 07/28/2018  With a planned report time of 5:30 am       ?  FACILITY ADDRESS     [facility], [address]. [place], [place] [address]  (The drop off point is the west entrance of [facility] Please use valet parking service at the [facility] entrance. It is available from 5 am to 5 pm for same rates as self parking If you arrive before 5 am or after 5 pm please have driver drop you off at [facility] entrance park in [facility] Structure)         QUESTIONS  If you have any questions between now and the day of your surgery, please do not hesitate to call:      [facility]: [phone]        [facility]: [phone]       DAY OF SURGERY ARRIVAL TIME:  On the day of your Surgery/Procedure, please arrive at the time provided by the surgery/preop team. If you have any questions regarding your arrival time, please call:      Preoperative Surgical Admissions at [facility]: [phone]                                MEDICATION INSTRUCTIONS:            REGULAR PRESCRIPTION MEDICATIONS:      Regular prescription medications should be taken the day of surgery with sips of water             AFTER YOUR VISIT WITH [place], IF YOU START TAKING A NEW MEDICATION BEFORE SURGERY, PLEASE CALL us TO MAKE SURE IT IS SAFE TO TAKE & WON'T EFFECT YOUR SURGERY.         MEDICATIONS TO STOP 7 DAYS BEFORE SURGERY/PROCEDURE:      PLEASE HOLD ASPIRIN AND ALL NSAIDS (non-steroidal anti-inflammatory drugs) SUCH AS advil, aleve, motrin, ibuprofen, relafen, lodine, feldene, Diclofenac, voltaren, indomethacin, naproxen, celebrex, Mobic. Tylenol and Tramadol are OK         Please hold vitamins, supplements, herbs, Co-Q 10 & fish oil.           TO DO LIST:      Please go to the LAB                 EATING/DRINKING     PLEASE DO NOT EAT OR  DRINK ANYTHING AFTER MIDNIGHT THE NIGHT BEFORE SURGERY.       ?  Preparing for your Surgery:      Please wear clean loose-fitting clothes and leave valuables at home          Bring a picture ID and your insurance card, and be prepared to pay your deductible or co-insurance by cash, check, or credit card when you arrive.            If you are going home after your surgery, please make sure to arrange for an adult to drive you home. If you do not do so, your surgery may be cancelled.       On The Day of Your Surgery:       Check in at the location mentioned above       If you are a woman of child bearing age, please  note that you may be asked to give a urine sample upon check-in.         You will meet your anesthesia and surgery teams before surgery.          Once surgery is over, you will wake up in the recovery room where you will be able to see your friends/family.          Once your time in the recovery room is complete, you will either go home or be admitted as planned.          If you go home, someone will need to stay with you for the first 24 hours after surgery.         Additional information about what to expect before & after surgery is available online at:  [email or web address]    [email or web address]        You medical records are available to you at [email or web address]  Select create account.

## 2018-07-11 NOTE — Interdisciplinary (Signed)
Pt is eligible for 48/72 HOUR RULE EXCEPTION BLOOD BANK COMPATILBILITY SPECIMEN, paper was submitted to the lab.

## 2018-07-11 NOTE — Anesthesia Preprocedure Evaluation (Addendum)
ANESTHESIA PRE-OPERATIVE EVALUATION    Patient Information    Name: Haley Barrett    MRN: [id number]    DOB: 11-Jul-1981    Age: 37 year old    Sex: female  Procedure(s):  FUSION, SPINE, LUMBAR, XLIF, 1 LEVEL L4-5  LAMINECTOMY, SPINE, LUMBAR, 1 LEVEL, WITH FUSION USING INSTRUMENTATION, POSTERIOR APPROACH L4-5      Pre-op Vitals:   BP 125/78 (BP Location: Right arm, BP Patient Position: Sitting, BP cuff size: Regular)   Pulse 70   Temp 97.2 F (36.2 C) (Temporal)   Resp 16   Ht 5\' 10"  (1.778 m)   Wt 78.5 kg (173 lb)   SpO2 96%   BMI 24.82 kg/m    BMI kg/m2: 24.82 kg/m2    Primary language spoken:  English    ROS/Medical History:      History of Present Illness: 37 year old female with complaints of low back pain. Complaints of low back pain and radiating leg pain and paresthesias, which remains significantly activity limiting    General:  negative for General ROS   Cardiovascular:  negative cardio ROS  No formal exercise 2/2 back pain-Denies SOB or C/P    EKG 07/11/2018  NSR@65    Anesthesia History:  Chronic pain patient comment:   Percocet-back pain (takes 1/2-3 tabs per day)  Denies any jaw, airway, or neck motion issues   Pulmonary:   negative pulmonary ROS     Neuro/Psych:   negative neuro/psych ROS   Hematology/Oncology:   hematologic/lymphatic negative      GI/Hepatic:  negative GI/hepatic ROS Infectious Disease:  negative for infectious disease     Renal:  negative renal ROS   Endocrine/Other:  back pain,     Pregnancy History:   Pediatrics:         Pre Anesthesia Testing (PCC/CPC) notes/comments:                 Physical Exam    Airway:    Inter-inciser distance 3-4 cm  Prognanth Able    Mallampati: I  Neck ROM: full  TM distance: 5-6 cm  Short thick neck: No          Cardiovascular:  - cardiovascular exam normal   Rhythm: regular   Rate: normal         Pulmonary:  - pulmonary exam normal      breath sounds clear to auscultation        Neuro/Neck/Skeletal/Skin:  - Neck/Neuro/Skeletal/Skin exam normal  comment          Dental:  - normal exam    Abdominal:      General: normal weight     Additional Clinical Notes:   Other Physical Exam Findings: General: AO X 3  HEENT: Pupils PERRLA  Musculoskeletal: Pt is Ambulatory  Pysch: Cooperative              Last  OSA (STOP BANG) Score:  No data recorded    Last OSA  (STOP) Score for   No data recorded      Has a physician diagnosed you with sleep apnea?: No  Do you use a CPAP at home?: No  OSA total score (A score of 2 or more is high risk. Offer patient sleep study.): 0    Past Medical History:   Diagnosis Date   . ADD (attention deficit disorder)    . Clavicular fracture      Past Surgical History:   Procedure Laterality Date   .  HUMERUS FRACTURE SURGERY Left 1993   . ------------OTHER-------------  37 yo    humerus ORIF      Social History     Tobacco Use   . Smoking status: Never Smoker   . Smokeless tobacco: Never Used   Substance Use Topics   . Alcohol use: Yes     Comment: socially, couple drinks/week    . Drug use: Yes     Types: Marijuana     Comment: edibles       Current Outpatient Medications   Medication Sig Dispense Refill   . Amphetamine-Dextroamphetamine (ADDERALL PO) as needed.     . drospirenone-ethinyl estradiol (YAZ) 3-0.02 MG tablet Take 1 tablet by mouth daily. 84 tablet 3   . lidocaine (LIDODERM) 5 % patch Apply 1 patch topically every 24 hours. Leave patch on for 12 hours, then remove for 12 hours. 30 patch 0   . naproxen (NAPROSYN) 500 MG tablet Take 1 tablet (500 mg) by mouth 2 times daily as needed (pain). Take with meals. 100 tablet 1   . oxyCODONE-acetaminophen (PERCOCET) 5-325 MG tablet Take 1 tablet by mouth every 6 hours as needed for Severe Pain (Pain Score 7-10). 15 tablet 0   . zolpidem (AMBIEN) 10 MG tablet Take 1 tablet (10 mg) by mouth nightly as needed for Insomnia. 30 tablet 0     No current facility-administered medications for this visit.      Allergies   Allergen Reactions   . Tramadol Nausea and Vomiting       Labs and Other  Data  Lab Results   Component Value Date    NA 137 07/07/2018    K 4.5 07/07/2018    CL 100 07/07/2018    BICARB 22 07/07/2018    BUN 8 07/07/2018    CREAT 0.90 07/07/2018    GLU 87 07/07/2018    [place] 9.3 07/07/2018           Anesthesia Plan:  Risks and Benefits of Anesthesia      I have prescribed the anesthetic plan:                  ASA 1 (Healthy)           Planned monitoring method: Routine monitoring

## 2018-07-11 NOTE — Addendum Note (Signed)
Addendum  created 07/11/18 1435 by Tawanna Solo, NP    Clinical Note Signed

## 2018-07-11 NOTE — H&P (Cosign Needed)
Anesthesia Consult/Preoperative History & Physical Exam    C/C:  Preoperative evaluation for upcoming surgery/procedure under anesthesia.     Referring Physician: Zlomislic, Vinko  Primary Care Physician: Clinton Sawyer     HPI:  Haley Barrett is a 37 year old female who was seen by preoperative clinic for evaluation prior to elective LAMINECTOMY, SPINE, LUMBAR, 1 LEVEL, WITH FUSION USING INSTRUMENTATION, POSTERIOR APPROACH L4-5  Patient is scheduled on DOS: 07/28/2018 at [facility]. Current symptoms include low back pain and radiating leg pain and paresthesias.     BP 125/78 (BP Location: Right arm, BP Patient Position: Sitting, BP cuff size: Regular)   Pulse 70   Temp 97.2 F (36.2 C) (Temporal)   Resp 16   Ht 5\' 10"  (1.778 m)   Wt 78.5 kg (173 lb)   SpO2 96%   BMI 24.82 kg/m    BMI kg/m2: 24.82 kg/m2    Primary language spoken:  English    ROS/Medical History:  History of Present Illness: 37 year old female with complaints of low back pain. Complaints of low back pain and radiating leg pain and paresthesias, which remains significantly activity limiting    General:  negative for General ROS   Cardiovascular:  negative cardio ROS  No formal exercise 2/2 back pain-Denies SOB or C/P    EKG 07/11/2018  NSR@65    Anesthesia History:  chronic pain patient (  Percocet-back pain (takes 1/2-3 tabs per day)),  Denies any jaw, airway, or neck motion issues   Pulmonary:   negative pulmonary ROS     Neuro/Psych:   negative neuro/psych ROS   Hematology/Oncology:   hematologic/lymphatic negative      GI/Hepatic:  negative GI/hepatic ROS Infectious Disease:  negative for infectious disease     Renal:  negative renal ROS   Endocrine/Other:  back pain,     Pregnancy History:   Pediatrics:         Pre Anesthesia Testing (PCC/CPC) notes/comments:    Physical Exam  Airway:  Inter-inciser distance 3-4 cm  Prognanth Able    Mallampati: I  Neck ROM: full  TM distance: 5-6 cm  Short thick neck: No    Cardiovascular:  -  cardiovascular exam normal   Rhythm: regular   Rate: normal     Pulmonary:  - pulmonary exam normal    breath sounds clear to auscultation    Neuro/Neck/Skeletal/Skin:  - Neck/Neuro/Skeletal/Skin exam normal comment    Dental:  - normal exam    Abdominal:  General: normal weight     Additional Clinical Notes:   Other Physical Exam Findings: General: AO X 3  HEENT: Pupils PERRLA  Musculoskeletal: Pt is Ambulatory  Pysch: Cooperative    Has a physician diagnosed you with sleep apnea?: No  Do you use a CPAP at home?: No  OSA total score (A score of 2 or more is high risk. Offer patient sleep study.): 0    Past Medical History:   Diagnosis Date   . ADD (attention deficit disorder)    . Clavicular fracture      Past Surgical History:   Procedure Laterality Date   . HUMERUS FRACTURE SURGERY Left 1993   . ------------OTHER-------------  37 yo    humerus ORIF      Social History     Tobacco Use   . Smoking status: Never Smoker   . Smokeless tobacco: Never Used   Substance Use Topics   . Alcohol use: Yes     Comment:  socially, couple drinks/week    . Drug use: Yes     Types: Marijuana     Comment: edibles       Current Outpatient Medications   Medication Sig Dispense Refill   . Amphetamine-Dextroamphetamine (ADDERALL PO) as needed.     . drospirenone-ethinyl estradiol (YAZ) 3-0.02 MG tablet Take 1 tablet by mouth daily. 84 tablet 3   . lidocaine (LIDODERM) 5 % patch Apply 1 patch topically every 24 hours. Leave patch on for 12 hours, then remove for 12 hours. 30 patch 0   . naproxen (NAPROSYN) 500 MG tablet Take 1 tablet (500 mg) by mouth 2 times daily as needed (pain). Take with meals. 100 tablet 1   . oxyCODONE-acetaminophen (PERCOCET) 5-325 MG tablet Take 1 tablet by mouth every 6 hours as needed for Severe Pain (Pain Score 7-10). 15 tablet 0   . zolpidem (AMBIEN) 10 MG tablet Take 1 tablet (10 mg) by mouth nightly as needed for Insomnia. 30 tablet 0     No current facility-administered medications for this visit.           Allergies   Allergen Reactions   . Tramadol Nausea and Vomiting       Labs and Other Data  Lab Results   Component Value Date    NA 137 07/07/2018    K 4.5 07/07/2018    CL 100 07/07/2018    BICARB 22 07/07/2018    BUN 8 07/07/2018    CREAT 0.90 07/07/2018    GLU 87 07/07/2018    [place] 9.3 07/07/2018     Anesthesia Plan:  Risks and Benefits of Anesthesia                    ASA 1 (Healthy)   Planned monitoring method: Routine monitoring    Anesthesia Assessment:  Valkyrie Guardiola is a 37 year old female with history as stated above presenting to preop clinic for evaluation prior to elective surgery.  -Preoperative history and physical completed today.  Exact anesthesia plan to be discussed day of surgery    Plan/Recommendations:  -Nyasha Rahilly is low risk for a intermediate risk elective surgery  And can proceed with surgery as planned. Location subject to change.  -Pre Op Tests Ordered: CBC, T&S, ABO  -Patient provided Written Pre Op Instructions (See EPIC Notes/Trans=Pt Instructions)    Lonny Prude, NP  [facility]

## 2018-07-11 NOTE — Progress Notes (Signed)
Chief Complaint   Patient presents with   . Follow Up       SUBJECTIVE::Haley Barrett is a 37 year old female here for follow-up. Problem list was updated and reviewed today with patient:    Patient feels that pain in back has improved such that she is able to go farther out with her percocet dosing and usually only uses it for really exertional activity.  Did not tolerate duloxetine after one day.  Has surgery on the 22 and understands that we do not plan to continue narcotic prescriptions following.  No new LE weakness, numbness or bowel bladder loss of control.      Patient Active Problem List    Diagnosis Date Noted   . Degenerative spondylolisthesis 06/08/2018     Added automatically from request for surgery [phone]     . Spinal stenosis of lumbar region with radiculopathy 06/08/2018     Added automatically from request for surgery [phone]     . Lumbar disc disease with radiculopathy 06/02/2018   . Lumbar spondylosis 03/28/2018     Added automatically from request for surgery [phone]     . Chronic bilateral low back pain, with sciatica presence unspecified 03/04/2018   . Finger dislocation, subsequent encounter 12/23/2015   . ADD (attention deficit disorder) 04/06/2013   . Birth control 04/06/2013       Past medical history and family history reviewed and updated today as below.  Allergies and medications reviewed and updated as below.    Past Medical History:   Diagnosis Date   . ADD (attention deficit disorder)    . Clavicular fracture        Social History:  Social History     Tobacco Use   . Smoking status: Never Smoker   . Smokeless tobacco: Never Used   Substance Use Topics   . Alcohol use: Yes     Comment: socially, couple drinks/week    . Drug use: Yes     Types: Marijuana     Comment: edibles     Social History     Patient does not qualify to have social determinant information on file (likely too young).   Social History Narrative    Single    Psychotherapist - just beginning, in [employer]. Went to  school in [place].    Born in [place] and moved to [place] 6 years ago    Tour guide-food and history tours     Hobbies: beach volleyball, horse back riding, exercise        Allergies:  Allergies   Allergen Reactions   . Tramadol Nausea and Vomiting       Current Outpatient Medications   Medication Sig   . Amphetamine-Dextroamphetamine (ADDERALL PO) as needed.   . drospirenone-ethinyl estradiol (YAZ) 3-0.02 MG tablet Take 1 tablet by mouth daily.   Marland Kitchen lidocaine (LIDODERM) 5 % patch Apply 1 patch topically every 24 hours. Leave patch on for 12 hours, then remove for 12 hours.   . naproxen (NAPROSYN) 500 MG tablet Take 1 tablet (500 mg) by mouth 2 times daily as needed (pain). Take with meals.   Marland Kitchen oxyCODONE-acetaminophen (PERCOCET) 5-325 MG tablet Take 1 tablet by mouth every 6 hours as needed for Severe Pain (Pain Score 7-10).   Marland Kitchen zolpidem (AMBIEN) 10 MG tablet Take 1 tablet (10 mg) by mouth nightly as needed for Insomnia.     No current facility-administered medications for this visit.  ROS:    Constitutional: negative for fever, chills, weight loss or weight gain  CV: negative for chest pain, palpitations, LEE  Respiratory: negative for SOB, DOE, PND, orthopnea  GI: negative for nausea, vomiting, abdominal pain    OBJECTIVE:: BP 110/70 (BP Location: Right arm, BP Patient Position: Sitting, BP cuff size: Regular)   Pulse 71   Temp 97.7 F (36.5 C) (Temporal)   Resp 16   Ht 5\' 10"  (1.778 m)   Wt 79 kg (174 lb 3.2 oz)   SpO2 99%   Breastfeeding No   BMI 25.00 kg/m   General Appearance: healthy, alert, no distress  Psych: pleasant affect, cooperative.  Heart:  normal rate and regular rhythm, no murmurs, clicks, or gallops.  Lungs: clear to auscultation.  Normal respiratory effort.  Abdomen: BS normal.  Abdomen soft, non-tender.  No masses or hepatomegaly.  Musculoskeletal:  no edema.    Labs and chart reviewed.    A/P:  Haley Barrett was seen today for follow up.    Diagnoses and all orders for this  visit:    Spinal stenosis, unspecified spinal region- patient trying to enroll in a trial for low back pain, hence the labs below.    -     Prothrombin Time, Blood Blue; Future  -     Hepatitis B Surface Antibody, Quant Yellow serum separator tube; Future  -     Hepatitis B Surface Ag, Blood Yellow serum separator tube; Future  -     Hepatitis B Core Antibody Total Yellow serum separator tube; Future    Chronic bilateral low back pain with bilateral sciatica- surgery on the 22nd.  After acute pain of surgery managed. No longer plan to continue narcotics.    -     oxyCODONE-acetaminophen (PERCOCET) 5-325 MG tablet; Take 1 tablet by mouth every 6 hours as needed for Severe Pain (Pain Score 7-10).  -  CURES reviewed.  See other note for picture.      Return if symptoms worsen or fail to improve.    Patient seen by and plan discussed with Dr. Sandria Bales, PGY3, IM

## 2018-07-12 ENCOUNTER — Telehealth (INDEPENDENT_AMBULATORY_CARE_PROVIDER_SITE_OTHER): Payer: Self-pay | Admitting: Hospitalist

## 2018-07-12 DIAGNOSIS — M48061 Spinal stenosis, lumbar region without neurogenic claudication: Secondary | ICD-10-CM

## 2018-07-12 DIAGNOSIS — M5116 Intervertebral disc disorders with radiculopathy, lumbar region: Secondary | ICD-10-CM

## 2018-07-12 LAB — MRSA SURVEILLANCE CULTURE

## 2018-07-12 NOTE — Telephone Encounter (Signed)
Re-order referral do to more information needed.   Order pended and Routing to Dr. Gerri Lins for approval.

## 2018-07-13 LAB — ECG 12-LEAD
ATRIAL RATE: 65 {beats}/min
ECG INTERPRETATION: NORMAL
P AXIS: 64 degrees
PR INTERVAL: 136 ms
QRS INTERVAL/DURATION: 86 ms
QT: 434 ms
QTC INTERVAL: 451 ms
R AXIS: 87 degrees
T AXIS: 56 degrees
VENTRICULAR RATE: 65 {beats}/min

## 2018-07-19 ENCOUNTER — Ambulatory Visit (HOSPITAL_BASED_OUTPATIENT_CLINIC_OR_DEPARTMENT_OTHER): Payer: Medicaid Other

## 2018-07-19 ENCOUNTER — Telehealth (INDEPENDENT_AMBULATORY_CARE_PROVIDER_SITE_OTHER): Payer: Self-pay | Admitting: Hospitalist

## 2018-07-19 ENCOUNTER — Encounter (INDEPENDENT_AMBULATORY_CARE_PROVIDER_SITE_OTHER): Payer: Self-pay | Admitting: Hospitalist

## 2018-07-19 DIAGNOSIS — Z789 Other specified health status: Principal | ICD-10-CM

## 2018-07-19 NOTE — Telephone Encounter (Signed)
Prescription Refill Request     Incoming call from patient requesting refill   Name of PCP Provider: Clinton SawyerHanson, Courtney C PCP  Last office visit: Visit date not found  Next office visit: Visit date not found    Allergies   Allergen Reactions   . Tramadol Nausea and Vomiting       Requested Medication/s:   drospirenone-ethinyl estradiol (YAZ) 3-0.02 MG tablet           Patient has made an attempt to refill with pharmacy? NO    Send to:     [id number] [place] [facility] [address]  [address] [place] [address]  [place] [id number]  Phone: [phone] Fax: [phone]      Has been advised this message will be transmitted to office and if any issues can expect a response within the next 24-72 hours, otherwise pharmacy should be contacting patient when refill is processed.

## 2018-07-19 NOTE — Telephone Encounter (Signed)
Medication requested: drospirenone-ethinyl estradiol (YAZ) 3-0.02 MG tablet   Amount refilled: 84 tab with 3 refill/s  Date last filled: 07/13/2018  Pertinent Lab/s: n/a    Last office visit in clinic::07/11/2018  Return To Clinic: n/a  Next office visit in clinic:07/19/2018    Pended and routed to cross cover     Routing to front desk to re establish care

## 2018-07-19 NOTE — Telephone Encounter (Signed)
From: Jackelyn HoehnMegan Hattabaugh  To: Meredeth IdeAli Asghar, MD  Sent: 07/19/2018 9:26 AM PST  Subject: 2-Procedural Question    Hi Dr. Andrey CotaAshar,     Could you re-up my birth control prescription? I thought someone had done it but I guess not and I need to get it refilled this week.    Thank you!  [name]

## 2018-07-20 ENCOUNTER — Encounter (INDEPENDENT_AMBULATORY_CARE_PROVIDER_SITE_OTHER): Payer: Self-pay | Admitting: Internal Medicine

## 2018-07-20 DIAGNOSIS — G4700 Insomnia, unspecified: Principal | ICD-10-CM

## 2018-07-20 MED ORDER — DROSPIRENONE-ETHINYL ESTRADIOL 3-0.02 MG OR TABS
1.0000 | ORAL_TABLET | Freq: Every day | ORAL | 3 refills | Status: DC
Start: 2018-07-20 — End: 2019-11-02

## 2018-07-20 NOTE — Telephone Encounter (Signed)
From: Jackelyn HoehnMegan Tiley  To: Gerlene FeeJuan Caytlin Better  Sent: 07/20/2018 11:15 AM PST  Subject: ZO:XWRUEARE:Refill request    Great thanks so much! Actually I am wondering if they could refill my ambien prescription as well? I'm having trouble sleeping at night because of pain and I'd rather take that than the pain killers when I can get away with it to sleep.    Thanks!  Leinaala  ----- Message -----  From: Gerlene FeeJuan Adenike Shidler  Sent: 07/20/2018 9:28 AM PST  To: Jackelyn HoehnMegan Wente  Subject: Refill request  Good morning Alaia,    Medication requested approved and [id number] [address] [place] [address] . Please let us know if you have any further questions.     Best,  Nigel BridgemanJuan Bellami Farrelly Jr, MA  [facility]   [address], [address]  [place], [place] [address]  [phone]  Fax [phone]

## 2018-07-20 NOTE — Telephone Encounter (Signed)
Duplicate.see mychart 07/19/18    Closing

## 2018-07-20 NOTE — Telephone Encounter (Signed)
Routing to cross cover for refill assistance. Please see my chart message below.    "Randie HeinzGreat thanks so much! Actually I am wondering if they could refill my ambien prescription as well? I'm having trouble sleeping at night because of pain and I'd rather take that than the pain killers when I can get away with it to sleep.    Thanks!  Haley Barrett"      Medication requested:    zolpidem (AMBIEN) 10 MG tablet       Amount refilled: 30 with 0 refill/s  Date last filled: 05/17/2018  Pertinent Lab/s:N/A    Last office visit in clinic::07/11/2018  Return To Clinic: N/A  Next office visit in clinic:08/15/2018    Pended and routed to cross covers.

## 2018-07-20 NOTE — Telephone Encounter (Signed)
Approved and signed order. [id number] [place] [place] [facility] [address] Please inform patient. Thank you!

## 2018-07-20 NOTE — Telephone Encounter (Signed)
My chart message sent to patient stating medication approved and sent to pharmacy of choice.   Closing encounter.

## 2018-07-20 NOTE — Telephone Encounter (Signed)
Last CURES 07/11/18.     Approve Zolpidem prescription. Will forward to Dr. Loleta ChanceHill for final e-prescribing.

## 2018-07-21 MED ORDER — ZOLPIDEM TARTRATE 10 MG OR TABS
10.0000 mg | ORAL_TABLET | Freq: Every evening | ORAL | 0 refills | Status: DC | PRN
Start: 2018-07-21 — End: 2018-10-31

## 2018-07-21 NOTE — Telephone Encounter (Signed)
Ambien was last filled by Ileene Hutchinsonawn Hinkle, PA (? Psychiatry).  Not in our system.    CURES otherwise appropriate, but has had multiple providers in the past several months.  Planning for surgery soon, also needs to establish care with new provider in our clinic.    Will refill Ambien this month.  Please make sure pt knows not to take with the Percocet.

## 2018-07-22 NOTE — Telephone Encounter (Signed)
My chart message sent to patient stating medication approved and sent to pharmacy of choice.   Closing encounter.

## 2018-07-26 NOTE — Telephone Encounter (Signed)
Who is calling: Ancillary: Helmut Musterlicia  from [name] is calling on behalf of patient  Insurance Coverage Verified: Active- in network  Reason for this call: Helmut Musterlicia requesting to speak with referral coordinator. States referral below is out of network for Patient. Will need additional information to submit to medical director.     Referral #[id number]  Other Clinic (Order # [id number]) on 07/13/2018  Neurological Surgery   Dr. Bluford Kaufmannh  NPI: [id number]  O- [phone]  [phone]      Action required by office: Please contact caller    Duplicate encounter? No previous documentation found on this issue.     Best way to contact: [phone]      Inquiry has been read verbatim to this caller. Verbalizes satisfaction and confirms the above is accurate: yes      Has been advised this message will be transmitted to office and can expect a response within the next 24-72 hours.

## 2018-07-27 NOTE — Telephone Encounter (Signed)
Who is calling: Ancillary: ALicia from [name]  is calling on behalf of patient  Insurance Coverage Verified: Active- in network  Reason for this call: Please reference msg below. Agent unable to reach referral coordinator at time of call. Routing as high priority per caller's request. Please assist.     Action required by office: Please expedite request and Please contact caller    Duplicate encounter? No previous documentation found on this issue.     Best way to contact: [phone]  Alternative: [phone]    Inquiry has been read verbatim to this caller. Verbalizes satisfaction and confirms the above is accurate: yes       Has been advised this message will be transmitted to office and can expect a response within the next 24-72 hours.

## 2018-07-27 NOTE — Telephone Encounter (Signed)
Routing to referrals coordinator for review    "Reason for this call: Haley Barrett requesting to speak with referral coordinator. States referral below is out of network for Patient. Will need additional information to submit to medical director. "

## 2018-07-27 NOTE — Telephone Encounter (Signed)
Rerouting to appropriate pool for Referrals Coordinator to f/u.

## 2018-07-28 ENCOUNTER — Encounter (HOSPITAL_COMMUNITY): Admission: RE | Payer: Self-pay | Source: Ambulatory Visit

## 2018-07-28 ENCOUNTER — Inpatient Hospital Stay (HOSPITAL_COMMUNITY)
Admission: RE | Admit: 2018-07-28 | Payer: Medicaid Other | Source: Ambulatory Visit | Admitting: Orthopaedic Surgery of the Spine

## 2018-07-28 HISTORY — PX: BACK SURGERY: SHX140

## 2018-07-28 SURGERY — FUSION, SPINE, LUMBAR, XLIF, 1 LEVEL
Site: Spine Lumbar

## 2018-07-29 NOTE — Telephone Encounter (Addendum)
The following authorization request was denied per patients insurance    Date of Order: 07/13/2018    Consult/Referral to:   Neurological surgery    Reason for consult/referral: Research Study Consultation FDA Trial for TOPS    Denial reason noted per insurance: This request is denied. This is because Haley Barrett cannot okay you going yo a doctor who is out of network. A [facility] doctor looked at this request using standard rules. It does not meet the rules. We were told you have low back pain, The facts sent showed it does not need treatment right away (emergency treatment). There is time for you to go to a [facility] doctor in your are. There is one who offers this treatment for back pain. You do not have a life threatening   condition.

## 2018-07-29 NOTE — Telephone Encounter (Addendum)
Routing to MD as Lorain ChildesFYI, internal referral pended for review

## 2018-07-30 ENCOUNTER — Encounter (INDEPENDENT_AMBULATORY_CARE_PROVIDER_SITE_OTHER): Payer: Self-pay | Admitting: Internal Medicine

## 2018-07-31 NOTE — Progress Notes (Signed)
Attending Note:  Chief Complaint   Patient presents with   . Follow Up      Subjective:  I reviewed the history.  Patient interviewed and examined.   Haley Barrett is a 37 year old female is here for FU of back pain.    Rest of hx as well outlined by Dr. Gerri LinsAsghar.  Review of Systems - 12 step ROS reviewed, and negative except as noted in HPI and PMHx.    Past Medical, Family, Social History:  As per  the resident's  Note and EPIC.  Past Medical History:   Diagnosis Date   . ADD (attention deficit disorder)    . Clavicular fracture      Objective:   I have examined the patient and I concur with the resident's exam.  Recent labs and imaging as well as other data reviewed.    Assessment/Plan:    Assessment and plan reviewed with the resident physician.  I agree with the resident's plan as documented.    See the resident's note for further details.      Follow up as per resident.    Patient Instruction:  See Patient Education/Instruction section.      Barriers to Learning assessed: none. All of the patients questions were answered to patient's satisfaction. Patient verbalizes understanding of teaching and instructions and is agreeable to above plan.

## 2018-08-01 ENCOUNTER — Ambulatory Visit (HOSPITAL_BASED_OUTPATIENT_CLINIC_OR_DEPARTMENT_OTHER): Payer: Medicaid Other | Admitting: Nurse Practitioner

## 2018-08-14 NOTE — Progress Notes (Signed)
Chief Complaint   Patient presents with   . Follow Up       Subjective  History of Present Illness  Jackelyn HoehnMegan Dunagan is a 37 year old female with a PMHx of chronic back pain and ADD here for acute visit for follow-up.    PCP Dr. Gerri LinsAsghar, last seen 07/11/2018. Hx of chronic back pain. Pt had surgery planned 11/22 which pt cancelled. She instead had facet joint replacement L4-L5 with spinal decomp[place] on [place]. Patient returned from [place] 3 days ago.     Patient continues to have high pain requirements. She was given palexia (tapentadol which is ER form of tramadol). She continues to take that along with naproxen 500mg  BID and percoet 5mg . She is requiring percocet every 4 hours. She notes some improvement in pain since the post-operative period.    Patient's goal is to return to work in the new year.     Review of Systems (note BOLD indicates positive symptom)  12-point ROS was completed and negative, except per HPI.    Past Medical History  Past medical history and family history reviewed and updated today.    Allergies and medications reviewed and updated.    Past Medical History:   Diagnosis Date   . ADD (attention deficit disorder)    . Clavicular fracture        Current Outpatient Medications   Medication Sig   . Amphetamine-Dextroamphetamine (ADDERALL PO) as needed.   . drospirenone-ethinyl estradiol (YAZ) 3-0.02 MG tablet Take 1 tablet by mouth daily.   Marland Kitchen. lidocaine (LIDODERM) 5 % patch Apply 1 patch topically every 24 hours. Leave patch on for 12 hours, then remove for 12 hours.   . naproxen (NAPROSYN) 500 MG tablet Take 1 tablet (500 mg) by mouth 2 times daily as needed (pain). Take with meals.   Marland Kitchen. oxyCODONE-acetaminophen (PERCOCET) 5-325 MG tablet Take 1 tablet by mouth every 6 hours as needed for Severe Pain (Pain Score 7-10).   Marland Kitchen. zolpidem (AMBIEN) 10 MG tablet Take 1 tablet (10 mg) by mouth nightly as needed for Insomnia.     No current facility-administered medications for this visit.               Objective  BP 116/82 (BP Location: Left arm, BP Patient Position: Sitting, BP cuff size: Regular)   Pulse 70   Temp 97.7 F (36.5 C) (Temporal)   Resp 20   Ht 5\' 10"  (1.778 m)   Wt 80.6 kg (177 lb 12.8 oz)   SpO2 99%   BMI 25.51 kg/m     Physical Exam  General: well-appearing, NAD. AOx3.  HEENT: PERRL. Moist mucous membranes.  CV: regular rate and rhythm, no m/r/g. Normal S1 and S2.   Resp: normal inspiratory effort with no accessory muscle use. Clear to auscultation bilaterally.  Abd: normoactive bowel sounds. No distention. No tenderness to palpation.  Extr: warm to touch. 2+ peripheral pulses. No leg edema.  Neuro: moves all extremities spontaneously. EOMI.  Skin: exam of back incision reveals well-healed incision. No ttp or overlying erythema. No drainage.     Labs and imaging: reviewed    Assessment and Care Plan  Jackelyn HoehnMegan Plack is a 37 year old female with a PMHx of chronic back pain and ADD here for acute visit for follow-up.    # Spinal stenosis with radiculopathy s/p facet joint replacement and decompression in [place]  Pt went to [place] 11/21 in [place]. No operative or post-operatively complications. Returned  to [place] 3 days ago. Per patient, she is continuing to have high pain requirements. Counseled on reducing NSAID use given long-term effects, will reduce dose. Also discussed reducing opiates. Will prescribe #60 tablets today. Pt advised to taper down and taper of palexia (which is also an weak opioid). Will re-refer to PT and orthopedics.   - PT re-referral  - Orthopedics re-referral  - Percocet 5mg  q4h prn - #60 tablets given  - Decrease to naproxen 250mg  BID    F/u with Dr. Raoul Pitch to establish care 09/09/2018.    Patient verbalizes understanding and is agreeable to above plan.  Assessment and care plan discussed with Dr. Ileene Rubens.    [name], MD  Internal Medicine, PGY-2  [facility]

## 2018-08-15 ENCOUNTER — Encounter (INDEPENDENT_AMBULATORY_CARE_PROVIDER_SITE_OTHER): Payer: Self-pay | Admitting: Internal Medicine

## 2018-08-15 ENCOUNTER — Ambulatory Visit (INDEPENDENT_AMBULATORY_CARE_PROVIDER_SITE_OTHER): Payer: Medicaid Other | Admitting: Internal Medicine

## 2018-08-15 VITALS — BP 116/82 | HR 70 | Temp 97.7°F | Resp 20 | Ht 70.0 in | Wt 177.8 lb

## 2018-08-15 DIAGNOSIS — G8929 Other chronic pain: Principal | ICD-10-CM

## 2018-08-15 DIAGNOSIS — M5441 Lumbago with sciatica, right side: Principal | ICD-10-CM

## 2018-08-15 DIAGNOSIS — Z1389 Encounter for screening for other disorder: Secondary | ICD-10-CM

## 2018-08-15 DIAGNOSIS — Z1339 Encounter for screening examination for other mental health and behavioral disorders: Secondary | ICD-10-CM

## 2018-08-15 DIAGNOSIS — Z1331 Encounter for screening for depression: Secondary | ICD-10-CM

## 2018-08-15 DIAGNOSIS — M5442 Lumbago with sciatica, left side: Principal | ICD-10-CM

## 2018-08-15 MED ORDER — NAPROXEN 250 MG OR TABS
250.00 mg | ORAL_TABLET | Freq: Two times a day (BID) | ORAL | 3 refills | Status: DC
Start: 2018-08-15 — End: 2018-10-31

## 2018-08-15 NOTE — Progress Notes (Signed)
Attending Note:  Chief Complaint   Patient presents with   . Follow Up      Subjective:  I reviewed the history.  Patient interviewed and examined.   Haley HoehnMegan Barrett is a 37 year old female is here for FU.  She did proceed with spinal stenosis sx in [place] for facet joint replacement and decompression.   She is here for medication and PT referral.   Review of Systems - 12 step ROS reviewed, and negative except as noted in HPI and PMHx.    Past Medical, Family, Social History:  As per  the resident's  Note and EPIC.  Past Medical History:   Diagnosis Date   . ADD (attention deficit disorder)    . Clavicular fracture      Objective:   I have examined the patient and I concur with the resident's exam.  Recent labs and imaging as well as other data reviewed.    Assessment/Plan:    Assessment and plan reviewed with the resident physician.  I agree with the resident's plan as documented.  I ran the patient's name and date-of-birth through the [facility] [employer] Prescription Drug Monitoring Program CURES website which will list controlled drug prescriptions [facility] in the past 12 months. The information in the patient's CURES report is consistent with their self-report and shows no evidence of medication abuse or doctor shopping.     See the resident's note for further details.      Follow up as per resident.    Patient Instruction:  See Patient Education/Instruction section.      Barriers to Learning assessed: none. All of the patients questions were answered to patient's satisfaction. Patient verbalizes understanding of teaching and instructions and is agreeable to above plan.

## 2018-08-15 NOTE — Patient Instructions (Signed)
It was a pleasure meeting you today. My name is Dr. Grant RutsAlisha Yeray Tomas and you can contact me if you have any questions. The clinic number is [phone].    Please return to clinic for a follow-up visit in 1 month with Dr. Raoul PitchSierra.    . PT and orthopedic clinic referral placed - please schedule appt with number provided  . Cont pain control with oxycodone and naproxen, if you run out of tablets, please call our clinic back and we can bridge you to your next appointment  . Taper off palexia medicine prescribed by [place] doctors

## 2018-08-16 ENCOUNTER — Encounter (INDEPENDENT_AMBULATORY_CARE_PROVIDER_SITE_OTHER): Payer: Self-pay | Admitting: Internal Medicine

## 2018-08-16 DIAGNOSIS — G8929 Other chronic pain: Principal | ICD-10-CM

## 2018-08-16 DIAGNOSIS — M5442 Lumbago with sciatica, left side: Principal | ICD-10-CM

## 2018-08-16 DIAGNOSIS — M5441 Lumbago with sciatica, right side: Principal | ICD-10-CM

## 2018-08-16 DIAGNOSIS — M5440 Lumbago with sciatica, unspecified side: Secondary | ICD-10-CM

## 2018-08-16 MED ORDER — ONDANSETRON 8 MG OR TBDP
8.0000 mg | ORAL_TABLET | Freq: Three times a day (TID) | ORAL | 0 refills | Status: DC | PRN
Start: 2018-08-16 — End: 2018-11-09

## 2018-08-16 MED ORDER — OXYCODONE-ACETAMINOPHEN 5-325 MG OR TABS
1.00 | ORAL_TABLET | ORAL | 0 refills | Status: DC | PRN
Start: 2018-08-16 — End: 2018-09-09

## 2018-08-16 NOTE — Telephone Encounter (Signed)
From: [name]  To: [name], MD  Sent: 08/16/2018 9:11 AM PST  Subject: 3-Referral Request Status    Hi Dr. Donia AstKabadi,    I went to pick up the naproxen today [facility]. Could you let me know when this gets sent through?    Thanks,  Genworth FinancialMegan

## 2018-08-16 NOTE — Telephone Encounter (Signed)
Approved under office visit yesterday thanks for the reminder, AS

## 2018-08-16 NOTE — Telephone Encounter (Signed)
Routed to Dr Donia AstKabadi     Medication Requested: oxyCODONE-acetaminophen (PERCOCET) 5-325 MG tablet   Date controlled substance last refilled: 07/11/2018  Amount refilled: 30 tab  Last office visit with PCP: 08/15/2018  Last office visit this clinic: 08/15/2018  Next office visit with PCP: Visit date not found   (should be every 3 months for opiates)  Next office visit this clinic: 09/09/2018    Last CURES report documented (should be every 4 months): Yes  Controlled Substance agreement signed?Yes   Last urine drug screen (within 1 year): No results found for: AMPCLASS, BARBCLASS, BENZYLCGNN, BENZDIAZCL, METHADONE, OPIATESCL, OXY, PHENCYCLDN, TETCANNABIN, UDI    Resident providers: Please document if medication approved and route to Attending Provider to e-prescribe to Pharmacy.

## 2018-08-16 NOTE — Telephone Encounter (Signed)
Patient seen in clinic s/p facet joint replacement on 12/9. Discussed refilling percocet 5mg  #60 tablets with Dr. Ileene RubensSani. Establish care appt with Dr. Raoul PitchSierra 09/09/2017. Routing to Dr. Ileene RubensSani for approval.    Haley RutsAlisha Nakaiya Beddow, MD  [facility], PGY-2

## 2018-08-16 NOTE — Telephone Encounter (Signed)
Routed to PCP and Dr Donia AstKabadi

## 2018-08-16 NOTE — Telephone Encounter (Signed)
From: Haley HoehnMegan Barrett  To: [name], MD  Sent: 08/16/2018 9:17 AM PST  Subject: 3-Referral Request Status    Oh and I was wondering if she could also refill something for nausea. I tend to get car sick anyways but having more trouble with it I assume because of being on the meds.    Thanks!  [name]

## 2018-08-16 NOTE — Telephone Encounter (Signed)
Signed, thanks

## 2018-08-16 NOTE — Telephone Encounter (Signed)
Pended and routed to Dr.Asghar and cross cover.

## 2018-08-18 ENCOUNTER — Encounter (INDEPENDENT_AMBULATORY_CARE_PROVIDER_SITE_OTHER): Payer: Self-pay | Admitting: Internal Medicine

## 2018-08-18 NOTE — Telephone Encounter (Signed)
My chart message sent to patient stating referral for research study was denied because its out of network. Advise patient if she will like for us to place a referral to internal neurology. Will wait for patient response.

## 2018-08-18 NOTE — Telephone Encounter (Signed)
Mychart sent to patient.

## 2018-08-19 NOTE — Telephone Encounter (Signed)
From: [name]  To: [name], MD  Sent: 08/18/2018 9:15 PM PST  Subject: 3-Referral Request Status    Got it thank you!    Same to you and a happy new year!  ----- Message -----  From: [address]Mary Anne StewartJosephine Montalla, [place]  Sent: 08/18/2018 11:45 AM PST  To: [name]  Subject: RE: 3-Referral Request Status  Hello [name],  Dr.Asghar sent the Ondansetron (Zofran) anti-nausea medication to [facility] in [place].    Merry Christmas!    Sincerely,  [name]  Resource License Vocational Nurse  [facility]  [address]  [place] [id number]    ----- Message -----   From: [name]   Sent: 08/16/2018 9:17 AM PST   To: [name], MD  Subject: 3-Referral Request Status    Oh and I was wondering if she could also refill something for nausea. I tend to get car sick anyways but having more trouble with it I assume because of being on the meds.    Thanks!  [name]

## 2018-08-19 NOTE — Telephone Encounter (Signed)
Patient responded to mychart message  Closing encounter.

## 2018-09-02 ENCOUNTER — Telehealth (HOSPITAL_BASED_OUTPATIENT_CLINIC_OR_DEPARTMENT_OTHER): Payer: Self-pay | Admitting: Nurse Practitioner

## 2018-09-02 ENCOUNTER — Encounter (INDEPENDENT_AMBULATORY_CARE_PROVIDER_SITE_OTHER): Payer: Medicaid Other | Admitting: Internal Medicine

## 2018-09-02 NOTE — Telephone Encounter (Signed)
Left vm for pt to call back and schedule with Spine provider.   Please schedule and attach referral.

## 2018-09-09 ENCOUNTER — Ambulatory Visit (INDEPENDENT_AMBULATORY_CARE_PROVIDER_SITE_OTHER): Payer: Medicaid Other | Admitting: Internal Medicine

## 2018-09-09 ENCOUNTER — Encounter (INDEPENDENT_AMBULATORY_CARE_PROVIDER_SITE_OTHER): Payer: Self-pay | Admitting: Internal Medicine

## 2018-09-09 VITALS — BP 96/80 | HR 98 | Temp 98.6°F | Resp 16 | Ht 70.0 in | Wt 173.6 lb

## 2018-09-09 DIAGNOSIS — G8929 Other chronic pain: Secondary | ICD-10-CM

## 2018-09-09 DIAGNOSIS — F5101 Primary insomnia: Secondary | ICD-10-CM

## 2018-09-09 DIAGNOSIS — M5416 Radiculopathy, lumbar region: Secondary | ICD-10-CM

## 2018-09-09 DIAGNOSIS — F988 Other specified behavioral and emotional disorders with onset usually occurring in childhood and adolescence: Secondary | ICD-10-CM

## 2018-09-09 DIAGNOSIS — M48061 Spinal stenosis, lumbar region without neurogenic claudication: Secondary | ICD-10-CM

## 2018-09-09 DIAGNOSIS — F329 Major depressive disorder, single episode, unspecified: Secondary | ICD-10-CM

## 2018-09-09 DIAGNOSIS — M5441 Lumbago with sciatica, right side: Secondary | ICD-10-CM

## 2018-09-09 DIAGNOSIS — F32A Depression, unspecified: Secondary | ICD-10-CM

## 2018-09-09 DIAGNOSIS — Z0189 Encounter for other specified special examinations: Secondary | ICD-10-CM

## 2018-09-09 DIAGNOSIS — M5442 Lumbago with sciatica, left side: Secondary | ICD-10-CM

## 2018-09-09 MED ORDER — BUPROPION XL (DAILY) 150 MG OR TB24
150.00 mg | ORAL_TABLET | Freq: Every morning | ORAL | 3 refills | Status: DC
Start: 2018-09-09 — End: 2018-10-31

## 2018-09-09 MED ORDER — OXYCODONE-ACETAMINOPHEN 5-325 MG OR TABS
1.00 | ORAL_TABLET | ORAL | 0 refills | Status: DC | PRN
Start: 2018-09-09 — End: 2018-11-23

## 2018-09-09 NOTE — Progress Notes (Signed)
ID/CC:     Chief Complaint   Patient presents with   . Establish Care     here to establish care       SUBJECTIVE: Haley Barrett is a 38 year old female here for follow up visit.   Pt was scheduled with me to establish care, however, she has been seen in resident clinic for years. PCP is Dr. Gerri Lins.  English speaking  On multiple pain medications, followed in Pain Clinic  Taking percocet, Remus Loffler, and adderall    Had spinal surgery done in November, L4-5 at outside hospital, went well, no complications  Played volleyball for many years, also rode horses  Did PT for years prior to surgery    Pt works as Airline pilot  Tried antidepressants in college, did ok on wellbutrin, would like to try again    Labs and chart reviewed.  Had recent labs done, all WNL    Patient Active Problem List    Diagnosis Date Noted   . Degenerative spondylolisthesis 06/08/2018     Added automatically from request for surgery [phone]     . Spinal stenosis of lumbar region with radiculopathy 06/08/2018     Added automatically from request for surgery [id number]     . Lumbar disc disease with radiculopathy 06/02/2018   . Lumbar spondylosis 03/28/2018     Added automatically from request for surgery [phone]     . Chronic bilateral low back pain, with sciatica presence unspecified 03/04/2018   . Finger dislocation, subsequent encounter 12/23/2015   . ADD (attention deficit disorder) 04/06/2013   . Birth control 04/06/2013       Past Medical History:   Diagnosis Date   . ADD (attention deficit disorder)    . Clavicular fracture        Social History:  Social History     Tobacco Use   . Smoking status: Never Smoker   . Smokeless tobacco: Never Used   Substance Use Topics   . Alcohol use: Yes     Comment: socially, couple drinks/week    . Drug use: Yes     Types: Marijuana     Comment: edibles     Family History   Problem Relation Name Age of Onset   . Diabetes Paternal Uncle     . Breast Cancer Paternal Aunt     . Breast Cancer Mother  11   . Breast  Cancer Paternal Grandmother     . Colon Cancer Neg Hx     . Hypertension Neg Hx     . Cholesterol/Lipid Disorder Neg Hx       Past Surgical History:   Procedure Laterality Date   . HUMERUS FRACTURE SURGERY Left 1993   . ------------OTHER-------------  38 yo    humerus ORIF        Allergies:  Allergies   Allergen Reactions   . Tramadol Nausea and Vomiting       Current Outpatient Medications   Medication Sig   . Amphetamine-Dextroamphetamine (ADDERALL PO) as needed.   Marland Kitchen buPROPion (WELLBUTRIN XL) 150 MG XL tablet Take 1 tablet (150 mg) by mouth every morning.   . drospirenone-ethinyl estradiol (YAZ) 3-0.02 MG tablet Take 1 tablet by mouth daily.   Marland Kitchen lidocaine (LIDODERM) 5 % patch Apply 1 patch topically every 24 hours. Leave patch on for 12 hours, then remove for 12 hours.   . naproxen (NAPROSYN) 250 MG tablet Take 1 tablet (250 mg) by mouth 2 times daily (with meals).   Marland Kitchen  ondansetron (ZOFRAN ODT) 8 MG disintegrating tablet Take 1 tablet (8 mg) by mouth every 8 hours as needed for Nausea/Vomiting.   Marland Kitchen oxyCODONE-acetaminophen (PERCOCET) 5-325 MG tablet Take 1 tablet by mouth every 4 hours as needed for Severe Pain (Pain Score 7-10).   Marland Kitchen zolpidem (AMBIEN) 10 MG tablet Take 1 tablet (10 mg) by mouth nightly as needed for Insomnia.     No current facility-administered medications for this visit.      ROS:    Constitutional: negative for fever, chills, unintentional weight loss or weight gain  CV: negative for chest pain, palpitations  Respiratory: negative for SOB, DOE, cough  GI: negative for nausea, vomiting, abdominal pain, no diarrhea or constipation  All other systems were reviewed and are negative except as noted in HPI    Physical Exam:  OBJECTIVE: BP 96/80 (BP Location: Left arm, BP Patient Position: Sitting, BP cuff size: Regular)   Pulse 98   Temp 98.6 F (37 C) (Temporal Artery)   Resp 16   Ht 5\' 10"  (1.778 m)   Wt 78.7 kg (173 lb 9.6 oz)   LMP  (LMP Unknown)   SpO2 99%   Breastfeeding No   BMI 24.91  kg/m   General Appearance: healthy, alert, no acute distress, pleasant affect  Back: well healing surgical scar    A/P:  Teaghan was seen today for follow up.    Diagnoses and all orders for this visit:    Spinal stenosis of lumbar region with radiculopathy - s/p lumbar surgery, recovering well, needing less percocet    Attention deficit disorder (ADD) without hyperactivity - would like to try going off of adderall, advised to taper    Depression, unspecified depression type - denies SI, would like to go back on antidepressant  -     buPROPion (WELLBUTRIN XL) 150 MG XL tablet; Take 1 tablet (150 mg) by mouth every morning.    Primary insomnia - see instructions    Routine lab draw - mother with Hashimoto's, pt requesting below labs. Asymptomatic  -     TSH, Blood - See Instructions; Future  -     Vitamin D, 25-OH Total Yellow serum separator tube; Future        Patient Instructions   Continue to wean off of percocet as tolerated  Try taking melatonin 5mg  for sleep  You can continue ambien as needed    Start wellbutrin 150mg  daily  Wean off of adderall as well - if you feel you need it you can restart    Labs ordered - vitamin D and TSH      Follow-up as scheduled  Barriers to learning assessed: None.  Patient verbalizes understanding and is agreeable to above plan.

## 2018-09-09 NOTE — Patient Instructions (Addendum)
Continue to wean off of percocet as tolerated  Try taking melatonin 5mg  for sleep  You can continue ambien as needed    Start wellbutrin 150mg  daily  Wean off of adderall as well - if you feel you need it you can restart    Labs ordered - vitamin D and TSH

## 2018-09-12 ENCOUNTER — Ambulatory Visit (HOSPITAL_BASED_OUTPATIENT_CLINIC_OR_DEPARTMENT_OTHER): Payer: Medicaid Other | Admitting: Rehabilitative and Restorative Service Providers"

## 2018-09-22 ENCOUNTER — Telehealth (HOSPITAL_BASED_OUTPATIENT_CLINIC_OR_DEPARTMENT_OTHER): Payer: Self-pay

## 2018-09-22 NOTE — Telephone Encounter (Signed)
Left vm for pt notifying them that their scheduled appointment for 1/21 with Marcelino Duster at 11a has been cnx due to a template change.  Provided CC# for rescheduling and apologized for the inconvenience.    Thank you

## 2018-09-27 ENCOUNTER — Telehealth (HOSPITAL_BASED_OUTPATIENT_CLINIC_OR_DEPARTMENT_OTHER): Payer: Self-pay | Admitting: Orthopedics

## 2018-09-27 ENCOUNTER — Ambulatory Visit (HOSPITAL_BASED_OUTPATIENT_CLINIC_OR_DEPARTMENT_OTHER): Payer: Medicaid Other | Admitting: Rehabilitative and Restorative Service Providers"

## 2018-09-27 NOTE — Telephone Encounter (Signed)
Left message for pt she will not be able to see Dr. Freida Busman is OOO on 09/28/18. Appt has been changed to see Dr. Rushie Chestnut. Please call with any questions

## 2018-09-28 ENCOUNTER — Encounter (HOSPITAL_BASED_OUTPATIENT_CLINIC_OR_DEPARTMENT_OTHER): Payer: Self-pay

## 2018-09-28 ENCOUNTER — Ambulatory Visit (HOSPITAL_BASED_OUTPATIENT_CLINIC_OR_DEPARTMENT_OTHER): Payer: Medicaid Other

## 2018-09-28 ENCOUNTER — Other Ambulatory Visit: Payer: Self-pay

## 2018-09-28 ENCOUNTER — Ambulatory Visit
Admission: RE | Admit: 2018-09-28 | Discharge: 2018-09-28 | Disposition: A | Discharge status: Home / Self Care | Payer: Medicaid Other | Attending: Internal Medicine | Admitting: Internal Medicine

## 2018-09-28 VITALS — BP 117/85 | HR 94 | Temp 97.7°F | Ht 70.0 in | Wt 174.2 lb

## 2018-09-28 DIAGNOSIS — M5441 Lumbago with sciatica, right side: Secondary | ICD-10-CM | POA: Insufficient documentation

## 2018-09-28 DIAGNOSIS — Z981 Arthrodesis status: Secondary | ICD-10-CM | POA: Insufficient documentation

## 2018-09-28 DIAGNOSIS — M5442 Lumbago with sciatica, left side: Secondary | ICD-10-CM | POA: Insufficient documentation

## 2018-09-28 DIAGNOSIS — M4316 Spondylolisthesis, lumbar region: Secondary | ICD-10-CM | POA: Insufficient documentation

## 2018-09-28 DIAGNOSIS — G8929 Other chronic pain: Secondary | ICD-10-CM | POA: Insufficient documentation

## 2018-09-28 MED ORDER — CALCIUM 1000 + D PO
ORAL | Status: DC
Start: ? — End: 2018-12-26

## 2018-09-28 MED ORDER — TURMERIC PO
ORAL | Status: DC
Start: ? — End: 2019-01-07

## 2018-09-28 NOTE — Patient Instructions (Signed)
Please start PT in 4 weeks  Please follow-up in Ortho Spine clinic in 2 months  Please continue to wear LSO brace  Please no heavy bending, lifting, twisting until seen again in clinic in 2 months

## 2018-09-28 NOTE — Progress Notes (Signed)
Orthopaedics Spine Clinic Evaluation    Reason for Visit: Follow-up.    Procedure:   07/27/2018  L4-5 posterior decompression and instrumentation with mobile instrumented device (Premix Nexux System)  Procedure in [place]    Interval HPI  The patient underwent the above procedure in November in [place] (image below). She reports improvement in her low back pain and radiating leg pain/heaviness. She reports a significantly improved activity/exercise tolerance. She denies bowel or bladder changes. No saddle anesthesia. No fevers or chills.         Per [facility]  HPI (obtained 06/08/2018):  38 year old female returns to spine clinic for initial evaluation of complaints of low back pain. She is here with her mother to review and discuss options. She was unable to obtain new MRI lumbar spine. She notes ongoing complaints of low back pain and radiating leg pain and paresthesias, which remains significantly activity limiting. To review briefly, she notes longstanding and worsening progression of low back pain and notably severe left posterior leg pain. Pain is worse especially over the past 6 months during which time she has been severely limited and nearly bed ridden on account of her low back pain and her leg pain. 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 She states that over the past six months she is unable to stand or walk for any prolonged period of time and has been unable to return to regular activities or working out or volleyball. She denies any focal motor deficits or weakness. Pain is worse with activity and relieved with rest.  Patient rates her pain as 5, largely improved after recent medrol dose pack she had been provided by outside provider.  She has had extensive course of conservative management including time, activity modification, pain medications, PT and injections in addition to  chiropractic, massage and acupuncture without any sustained relief, and with progression. Patient reports no changes in bowel and bladder function.    Physical Examination:   BP 117/85 (BP Location: Left arm, BP Patient Position: Sitting, BP cuff size: Regular)   Pulse 94   Temp 97.7 F (36.5 C) (Oral)   Ht 5\' 10"  (1.778 m)   Wt 79 kg (174 lb 3.2 oz)   LMP  (LMP Unknown)   BMI 25.00 kg/m     General / Psychiatric: healthy, no distress, cooperative, AOx3, normal affect, appropriate  HEENT: NC/AT, EOMI, sclera anicteric    Cardio: RRR per peripheral pulses  Pulmonary: breathing quietly without use of accessory muscles  Abdomen: soft, nondistended    Lumbar spine incision healed at this time     Lower Extremity Motor Strength    Muscle Left/5 Right/5 Comment   Hip flexion (L2) 5 5    Knee extension (L3) 5 5    Tibialis anterior (L4) 5 5    Extensor hallucis (L5) 5 5    gastrocsoleus (S1) 5 5      Lower Extremity Sensation to Light Touch  Nerve Left Right Comment   L2 Intact Intact    L3 Intact Intact    L4 Intact Intact    L5 Intact Intact    S1 Intact Intact          *Some difficulty with heel walking but is able to do it.    Imaging:   XR Lumbar Spine (post-op from [place]) -          XR  Lumbar Spine (09/28/2018) -   There has been placement of pedicle screws at L4/L5 with a mobile posterior interspinous bridge without any hardware complications. There has been broad posterior decompression with spinous process resection facetectomy. Alignment is unchanged with 5 mm anterolisthesis. The upper lumbar spine appears normal.    Assessment:  38 year old female presenting for L4-5 spondylolisthesis, lumbar stenosis, and neurogenic claudication. She underwent posterior decompression and mobile stabilization in [place] in November. She is doing well at this time.    - Recommend ongoing LSO brace while ambulating  - No bending, lifting, twisting  - PT referral (internal to [facility]) placed and to start in about 1 month  from today  - Patient to follow-up in 2 months for flex/ext XR of the lumbar spine    Discussed with Dr. Tomasa Blase MD  [facility] of Orthopa[facility]

## 2018-09-30 NOTE — Progress Notes (Signed)
I saw the patient with Dr Artist Pais and agree with his assessment and plan.    Attending Physician Addendum:   During this clinic visit I personally evaluated the patient. This included but was   not limited to reviewing the current chief complaint, history of present illness,   relevant past medical and surgical histories, and relevant medications and allergies.   Following this I performed a physical examination of the patient and correlated   this with relevant radiographic studies which were available. The patient's reason   for the visit was assessed and I formulated a treatment plan. This plan was   discussed with the patient and/or caregiver present at the visit and their questions were answered.    A/P: Haley Barrett is a 38 year old female 2 months s/p NEXUS system placement in [place] here to establish follow up. Doing well without issue. Recommend brace for 1 more month. Will start PT in 1 month. Return in 2 months for re-evaluation.    Ethlyn Daniels, MD  Ortho Spine Fellow  [facility] [id number]

## 2018-10-04 ENCOUNTER — Ambulatory Visit (HOSPITAL_BASED_OUTPATIENT_CLINIC_OR_DEPARTMENT_OTHER): Payer: Self-pay | Admitting: Rehabilitative and Restorative Service Providers"

## 2018-10-17 ENCOUNTER — Telehealth (HOSPITAL_BASED_OUTPATIENT_CLINIC_OR_DEPARTMENT_OTHER): Payer: Self-pay | Admitting: Orthopedics

## 2018-10-17 NOTE — Telephone Encounter (Signed)
I have left message for CB.

## 2018-10-17 NOTE — Telephone Encounter (Signed)
Haley Barrett calling in saying she is noticing a lot more pain after surgery. She said it started over the weekend. Burning pain near and above the incision also feeling very nauseous. She is calling in wanting to know if this is normal.       Best callback number [phone]

## 2018-10-17 NOTE — Telephone Encounter (Signed)
Pt called back and would like a return call.

## 2018-10-17 NOTE — Telephone Encounter (Signed)
Pt reports: She has burning pain to her back. She feels like the hardware is sticking out. She denies new onset of tingling/weakness/numbness to legs or feet. She denies B/B issues. Will send to provider to review with D.r Freida Busman. No Gabapentin.

## 2018-10-18 NOTE — Telephone Encounter (Signed)
Pt is calling to f.u on her previous message. She is requesting an update.

## 2018-10-18 NOTE — Telephone Encounter (Signed)
Will send to provider

## 2018-10-19 ENCOUNTER — Encounter (HOSPITAL_BASED_OUTPATIENT_CLINIC_OR_DEPARTMENT_OTHER): Payer: Self-pay | Admitting: Nurse Practitioner

## 2018-10-19 DIAGNOSIS — Z981 Arthrodesis status: Secondary | ICD-10-CM

## 2018-10-19 NOTE — Telephone Encounter (Signed)
TC with pt. Notified her of ordered MRI and CT lumbar.

## 2018-10-19 NOTE — Progress Notes (Signed)
Dr. Freida Busman recommend lumbar CT and MRI for assessment and follow up after.  Orders placed.  Pt advised to call radiology.

## 2018-10-20 ENCOUNTER — Encounter (HOSPITAL_BASED_OUTPATIENT_CLINIC_OR_DEPARTMENT_OTHER): Payer: Self-pay | Admitting: Orthopedics

## 2018-10-20 NOTE — Telephone Encounter (Signed)
Pt is calling in because she has sent a Mychart message regarding changing her Order's for CT and MRI to be changed to outside referrals so she can go to [facility] because radiology in [place] in booked out , please advice   Referral Information     Referral # Creation Date Referral Status Status Update   [id number] 10/19/2018 New Request 10/19/2018: Status History   Status Reason Referral Type Referral Reasons Referral Class   none MRI/EMG/CAT/PET CT Referral Internal     Referral Information     Referral # Creation Date Referral Status Status Update   [id number] 10/19/2018 New Request 10/19/2018: Status History   Status Reason Referral Type Referral Reasons Referral Class   none MRI/EMG/CAT/PET MRI Referral Internal

## 2018-10-21 ENCOUNTER — Encounter (HOSPITAL_BASED_OUTPATIENT_CLINIC_OR_DEPARTMENT_OTHER): Payer: Self-pay | Admitting: Nurse Practitioner

## 2018-10-21 DIAGNOSIS — M5416 Radiculopathy, lumbar region: Secondary | ICD-10-CM

## 2018-10-21 DIAGNOSIS — Z981 Arthrodesis status: Secondary | ICD-10-CM

## 2018-10-21 DIAGNOSIS — M545 Low back pain, unspecified: Secondary | ICD-10-CM

## 2018-10-21 NOTE — Progress Notes (Signed)
Studies CT and MRI switched to external per pt's request.

## 2018-10-21 NOTE — Telephone Encounter (Signed)
Order in, please assist. Thank you!

## 2018-10-25 ENCOUNTER — Encounter (HOSPITAL_BASED_OUTPATIENT_CLINIC_OR_DEPARTMENT_OTHER): Payer: Self-pay | Admitting: Orthopedics

## 2018-10-25 NOTE — Telephone Encounter (Signed)
Patient calling to check in on status for faxed orders to [facility] (imaging healthcare [facility])    Please call patient once orders have been sent.     Note      Pt is calling in because she has sent a Mychart message regarding changing her Order's for CT and MRI to be changed to outside referrals so she can go to [facility] because radiology in [place] in booked out , please advice   Referral Information     Referral # Creation Date Referral Status Status Update   [id number] 10/19/2018 New Request 10/19/2018: Status History   Status Reason Referral Type Referral Reasons Referral Class   none MRI/EMG/CAT/PET CT Referral Internal     Referral Information     Referral # Creation Date Referral Status Status Update   [id number] 10/19/2018 New Request 10/19/2018: Status History   Status Reason Referral Type Referral Reasons Referral Class   none MRI/EMG/CAT/PET MRI Referral Internal

## 2018-10-27 ENCOUNTER — Telehealth (HOSPITAL_BASED_OUTPATIENT_CLINIC_OR_DEPARTMENT_OTHER): Payer: Self-pay | Admitting: Nurse Practitioner

## 2018-10-27 NOTE — Telephone Encounter (Signed)
Who is calling: Arron from [employer] Coverage Verified: [facility]- [facility]    Reason for this call:   CT L-Spine scan has been denied by [facility]   Referral #[id number] 10/21/2018   Possible peer to peer best Contact # for Luther Redo [phone] Option 1   Reference # [id number]  Case will close tomorrow 10/28/2018    Action required by office: No action needed. Routing to clinic as FYI         Best way to contact: If needed to Contact Clifton Custard from [employer], best contact # is [phone] or Luther Redo at [phone] Option 1        Has been advised this message will be transmitted to office and can expect a response within the next 24-72 hours.

## 2018-10-27 NOTE — Telephone Encounter (Signed)
Will send for peer.

## 2018-10-28 NOTE — Telephone Encounter (Signed)
Spoke to pt and she has completed MRI L/S.She will have CT and MRI done prior to visit and bring CDs.

## 2018-10-31 ENCOUNTER — Ambulatory Visit (INDEPENDENT_AMBULATORY_CARE_PROVIDER_SITE_OTHER): Payer: Medicaid Other | Admitting: Nurse Practitioner

## 2018-10-31 ENCOUNTER — Other Ambulatory Visit: Payer: Medicaid Other | Attending: Internal Medicine

## 2018-10-31 ENCOUNTER — Encounter (INDEPENDENT_AMBULATORY_CARE_PROVIDER_SITE_OTHER): Payer: Self-pay | Admitting: Nurse Practitioner

## 2018-10-31 VITALS — BP 100/80 | HR 93 | Temp 96.9°F | Wt 177.0 lb

## 2018-10-31 DIAGNOSIS — M48061 Spinal stenosis, lumbar region without neurogenic claudication: Secondary | ICD-10-CM

## 2018-10-31 DIAGNOSIS — Z0189 Encounter for other specified special examinations: Secondary | ICD-10-CM | POA: Insufficient documentation

## 2018-10-31 LAB — VITAMIN D, 25-OH TOTAL: Vitamin D, 25-Hydroxy: 27 ng/mL — ABNORMAL LOW (ref 30–80)

## 2018-10-31 LAB — TSH, BLOOD: TSH: 1.49 u[IU]/mL (ref 0.27–4.20)

## 2018-10-31 MED ORDER — ZOLPIDEM TARTRATE 5 MG OR TABS
5.0000 mg | ORAL_TABLET | Freq: Every evening | ORAL | 0 refills | Status: DC | PRN
Start: 2018-10-31 — End: 2018-11-23

## 2018-10-31 MED ORDER — CYCLOBENZAPRINE HCL 10 MG OR TABS
ORAL_TABLET | ORAL | 0 refills | Status: DC
Start: 2018-10-31 — End: 2018-11-29

## 2018-10-31 MED ORDER — GABAPENTIN 100 MG OR CAPS
ORAL_CAPSULE | ORAL | 0 refills | Status: DC
Start: 2018-10-31 — End: 2018-11-30

## 2018-10-31 NOTE — Progress Notes (Signed)
Haley Barrett  MRN: [id number]  DOB: 08-30-1981  PMD: Meredeth Ide        Chief Complaint:   Chief Complaint   Patient presents with   . Back Pain     continuing after surgery       HPI:  Haley Barrett is a 38 year old female  who presents with persistent LBP x 3 yrs, requesting ref for 2nd opinion with Dr. Christa See for spondololisthesis etc that may require surgery.  Would like to try gabapentin for pain control, uses nsaids heavily wants labs done to check kidneys, did well with flexeril in past would be willing to try again to decrease dependence on ambien to sleep (requests refill but at lower dose).  Patient denies saddle anesthesia, loss of bowel or bladder control or LE weakness.  Educated re: cauda equina warning signs and need for immediate medical evaluation/treatment if they occur.      PCP is Dr. Gerri Lins utd on pap, flu shot, needs tdap    Past Medical History:   Diagnosis Date   . ADD (attention deficit disorder)    . Clavicular fracture      Past Surgical History:   Procedure Laterality Date   . HUMERUS FRACTURE SURGERY Left 1993   . ------------OTHER-------------  38 yo    humerus ORIF      Social History     Socioeconomic History   . Marital status: Single     Spouse name: Not on file   . Number of children: Not on file   . Years of education: Not on file   . Highest education level: Not on file   Occupational History   . Not on file   Social Needs   . Financial resource strain: Not on file   . Food insecurity:     Worry: Not on file     Inability: Not on file   . Transportation needs:     Medical: Not on file     Non-medical: Not on file   Tobacco Use   . Smoking status: Never Smoker   . Smokeless tobacco: Never Used   Substance and Sexual Activity   . Alcohol use: Yes     Comment: socially, couple drinks/week    . Drug use: Yes     Types: Marijuana     Comment: edibles   . Sexual activity: Not Currently     Partners: Male     Birth control/protection: Pill   Lifestyle   . Physical activity:     Days  per week: Not on file     Minutes per session: Not on file   . Stress: Not on file   Relationships   . Social connections:     Talks on phone: Not on file     Gets together: Not on file     Attends religious service: Not on file     Active member of club or organization: Not on file     Attends meetings of clubs or organizations: Not on file     Relationship status: Not on file   . Intimate partner violence:     Fear of current or ex partner: Not on file     Emotionally abused: Not on file     Physically abused: Not on file     Forced sexual activity: Not on file   Other Topics Concern   . Not on file   Social History Narrative    Single  Psychotherapist - just beginning, in private practice. Went to school in [place].    Born in [place] and moved to [place] 6 years ago    Tour guide-food and history tours     Hobbies: beach volleyball, horse back riding, exercise      Family History   Problem Relation Name Age of Onset   . Diabetes Paternal Uncle     . Breast Cancer Paternal Aunt     . Breast Cancer Mother  64   . Breast Cancer Paternal Grandmother     . Colon Cancer Neg Hx     . Hypertension Neg Hx     . Cholesterol/Lipid Disorder Neg Hx       Current medications:  Current Outpatient Medications   Medication Sig   . Amphetamine-Dextroamphetamine (ADDERALL PO) as needed.   . Calcium Carb-Cholecalciferol (CALCIUM 1000 + D PO)    . cyclobenzaprine (FLEXERIL) 10 MG tablet 1/2 to 1 tab HS PRN pain/muscle spasm   . drospirenone-ethinyl estradiol (YAZ) 3-0.02 MG tablet Take 1 tablet by mouth daily.   Marland Kitchen gabapentin (NEURONTIN) 100 MG capsule Start 1 capsule HS slowly increase to 2 and then 3 capsules HS as tolerated   . ondansetron (ZOFRAN ODT) 8 MG disintegrating tablet Take 1 tablet (8 mg) by mouth every 8 hours as needed for Nausea/Vomiting.   Marland Kitchen oxyCODONE-acetaminophen (PERCOCET) 5-325 MG tablet Take 1 tablet by mouth every 4 hours as needed for Severe Pain (Pain Score 7-10).   . TURMERIC PO    . zolpidem  (AMBIEN) 5 MG tablet Take 1 tablet (5 mg) by mouth nightly as needed for Insomnia.     No current facility-administered medications for this visit.        Review of Systems   Constitutional: Negative for fever.   HENT: Negative for congestion.    Eyes: Negative for visual disturbance.   Respiratory: Negative for cough.    Cardiovascular: Negative for chest pain.   Gastrointestinal: Negative for abdominal pain.   Endocrine: Negative for polyuria.   Genitourinary: Negative for dysuria.   Musculoskeletal: Positive for back pain.   Skin: Negative for rash.   Allergic/Immunologic: Negative for immunocompromised state.   Neurological: Positive for headaches.   Hematological: Negative for adenopathy.   Psychiatric/Behavioral: Negative for agitation.        Physical Examination:    10/31/18  1001   BP: 100/80   Pulse: 93   Temp: 96.9 F (36.1 C)   SpO2: 99%     General:  Well appearing, no distress  Eyes:  Anicteric. No conjunctival injection.  Mouth:  MMM.  Lungs clear.  No rales, rhonchi, or wheezing.   Cardiovascular:  RRR without murmur.  No peripheral edema, no cyanosis. Cap refill <2 sec  Back:  FROM, (+) tender over L/S spine/paraspinal muscles, bilat equal patellar reflexes, pedal pulses and (-) SLR and equal LE strength. Surgical scar noted.  Extremities:  No swelling or deformities.  Neuro:  A&Ox3.  Normal mentation.   Skin: Warm, dry.  Good turgor. No discoloration, lesions or rashes.        Assessment/Plan:         ICD-10-CM ICD-9-CM    1. Spinal stenosis of lumbar region, unspecified whether neurogenic claudication present M48.061 724.02 [facility]      cyclobenzaprine (FLEXERIL) 10 MG tablet      gabapentin (NEURONTIN) 100 MG capsule      zolpidem (AMBIEN) 5 MG tablet  Consult/Referral to Pain Clinic       1. Spinal stenosis of lumbar region, unspecified whether neurogenic claudication present  - Orthopedics Clinic - for 2nd opinion with Dr. Erline Levine  - cyclobenzaprine (FLEXERIL) 10 MG tablet; 1/2  to 1 tab HS PRN pain/muscle spasm  Dispense: 30 tablet; Refill: 0  - gabapentin (NEURONTIN) 100 MG capsule; Start 1 capsule HS slowly increase to 2 and then 3 capsules HS as tolerated  Dispense: 90 capsule; Refill: 0  - zolpidem (AMBIEN) 5 MG tablet; Take 1 tablet (5 mg) by mouth nightly as needed for Insomnia.  Dispense: 30 tablet; Refill: 0  - Consult/Referral to Pain Clinic for pain management stragegies  -pt instructed to start flexeril hs first, once comfortable add gabapentin and titrate slowly.  To be very cautious in taking zolpidem in addition to these two medications as sedation may be compounded.  -f/u with pcp within 4 wks for reassessment      See patient d/c instructions provided  Return/ED precautions reviewed with pt - follow up PRN sxs persist/worsen/new sxs develop  Patient (or responsible party) verbalizes understanding of plan  F/u with pcp for routine care     Supervising MD for clinical site where visit took place today:  Dr. Ileene Rubens

## 2018-10-31 NOTE — Interdisciplinary (Signed)
Blood drawn from right arm with 21 gauge needle. 2 tubes taken.   Patient identity authenticated by [facility].

## 2018-10-31 NOTE — Patient Instructions (Signed)
Getting Back to Normal After Low Back Pain: Care Instructions      Your Care Instructions    Almost everyone has low back pain at some time. The good news is that most low back pain will go away in a few days or weeks with some basic self-care.    Some people are afraid that doing too much may make their pain worse. In the past, people stayed in bed, thinking this would help their backs. Now doctors think that, in most cases, getting back to your normal activities is good for your back, as long as you avoid doing things that make your pain worse.    Follow-up care is a key part of your treatment and safety. Be sure to make and go to all appointments, and call your doctor if you are having problems. It's also a good idea to know your test results and keep a list of the medicines you take.    How can you care for yourself at home?    Ease back into daily activities    • For the first day or two of pain, take it easy. But as soon as possible, get back to your normal daily life and activities.  • Get gentle exercise, such as walking. Movement keeps your spine flexible and helps your muscles stay strong.  • If you are an athlete, return to your activity carefully. Choose a low-impact option until your pain is under control.    Avoid or change activities that cause pain    • Try to avoid too much bending, heavy lifting, or reaching. These movements put extra stress on your back.  • In bed, try lying on your side with a pillow between your knees. Or lie on your back on the floor with a pillow under your knees.  • When you sit, place a small pillow, a rolled-up towel, or a lumbar roll in the curve of your back for extra support.  • Try putting one foot up on a stool or changing positions every few minutes if you have to stand still for a period of time.    Pay attention to body mechanics and posture    Body mechanics are the way you use your body. Posture is the way you sit or stand.  • Take extra care when you lift. When  you must lift, bend your knees and keep your back straight. Avoid twisting, and keep the load close to your body.  • Stand or sit tall, with your shoulders back and your stomach pulled in to support your back.    Get support when you need it    • Let people know when you need a helping hand. Get family members or friends to help out with tasks you cannot do right now.  • Be honest with your doctor about how the pain affects you.  • If you have had to take time off work, talk to your doctor and boss about a gradual return-to-work plan. Find out if there are other ways you could do your job to avoid hurting your back again.    Reduce stress    Worrying about the pain can cause you to tense the muscles in your lower back. This in turn causes more pain. Here are a few things you can do to relax your mind and your muscles:    • Take 10 to 15 minutes to sit quietly and breathe deeply. Try to focus only on your breathing.   If you cannot keep thoughts away, think about things that make you feel good.  • Get involved in your favorite hobby, or try something new.  • Talk to a friend, read a book, or listen to your favorite music.  • Find a counselor you like and trust. Talk openly and honestly about your problems. Be willing to make some changes.    When should you call for help?    Call 911 anytime you think you may need emergency care. For example, call if:    • You are unable to move a leg at all.    Call your doctor now or seek immediate medical care if:    • You have new or worse symptoms in your legs, belly, or buttocks. Symptoms may include:  o Numbness or tingling.  o Weakness.  o Pain.  • You lose bladder or bowel control.    Watch closely for changes in your health, and be sure to contact your doctor if you are not getting better as expected.    Care instructions adapted under license by [employer]. This care instruction is for use with your licensed healthcare professional. If you have questions about a medical  condition or this instruction, always ask your healthcare professional. [employer] disclaims any warranty or liability for your use of this information.

## 2018-11-01 ENCOUNTER — Ambulatory Visit
Payer: Medicaid Other | Attending: Rehabilitative and Restorative Service Providers" | Admitting: Rehabilitative and Restorative Service Providers"

## 2018-11-01 DIAGNOSIS — M4316 Spondylolisthesis, lumbar region: Principal | ICD-10-CM | POA: Insufficient documentation

## 2018-11-01 NOTE — Interdisciplinary (Signed)
Physical Therapy Evaluation    Referring Physician Sani, Armelia    Diagnosis     ICD-10-CM ICD-9-CM    1. Spondylolisthesis of lumbar region M43.16 738.4        Preferred Language:English    Start of Service  Start of Care: 11/01/18  Reason for referral: Pain;Postural dysfunction;Strength impairment;Activity tolerance limitation              Past Medical History:   Diagnosis Date   . ADD (attention deficit disorder)    . Clavicular fracture       Past Surgical History:   Procedure Laterality Date   . HUMERUS FRACTURE SURGERY Left 1993   . ------------OTHER-------------  38 yo    humerus ORIF        Physical Therapy Evaluation     Row Name 11/01/18 1100          Medical History    History of presenting condition  Pt is 3 months post spinal decompression surgery performed in [place].  Patient reports a good amount of her symptoms were alleviated.  Recently, she reports increased burning and bump on back on spine. Hard to sit at work.  Pt is currently on advil and tylenol. Pain is less controlled. Since procedure, 50% improvement.  If walking too far burning comes on. Laying down pulls on back.  Working to see clients as Airline pilot.        Fall history  No reported falls in the last 6 months     Row Name 11/01/18 1100          Functional History    Prior Level of Function  Sedentary lifestyle recumenbent bike     Employment Status  Employed     Functional Mobility Assistance Needs  None-Independent with mobility     General ADL/Self-Care Assistance Needs  Low level of assist groceries, walking dog     Row Name 11/01/18 1100          Social History    Living Situation  Lives alone with dog     Home Environment  Multi-story home     Home accessibility   Stairs present     Row Name 11/01/18 1100          Pain Assessment    Pain Asssessment Tool  Numeric Pain Rating Scale     Row Name 11/01/18 1100          Numeric Pain Rating Scale     Pain Intensity - rating at present  5     Pain Intensity - rating  at worst   8     Frequency   Constant     Description   Dull;Aching;Sharp;Radiating     Pain Description  premedicated; worse with sitting and walking community 472ft      Row Name 11/01/18 1100          Objective    Overall Cognitive Status  Intact - no cognitive limitations or impairments noted           Observations:    Standing: increased lumbar lordosis holding on to waist   Squat: mild trunk weakness   Gait:  Wide BOS, increased pelvic motion, symmetrical step length   Balance: L 15 sec R 18 sec     AROM    Flexion Flat lumbar spine to shin 15% limited   Extension 75% limited    Left Right   Rotation 25% limited 25% limited   Sidebend 25% limited  25% limited     Neuro Exam:   Myotomes Left Right   L2 3+/5 3+/5   L3 5/5 5/5   L4 5/5 5/5   L5 3+/5 3+/5   S1   6x 5x       Dermatomes Left Right    Lateral foot into shin 1st toe         Strength Left Right   Gluteus Max 4/5 4/5   Gluteus Med 5/5 5/5   Abdominal Strength:  [place] 2b      PAIVM: palpable step off L1-L2    -     Row Name 11/01/18 1100          Patient/Family Education    Learner(s)  Patient     Patient/family training in appropriate therapeutic interventions  Ongoing     Education Topic(s)  Mobility;Exercise Program     Row Name 11/01/18 1100          Assessment     Assessment   Haley Barrett is a 38 yo female who returns to physical therapist status post posterior decompression performed in [place] in Nov 2019. Since her surgery she has revieved very minimal PT to address trunk and core stability. She has better potential to improve functional mobility and trunk stability this esp now that she is more stable in lumbar spine and is in less pain.     Unfortunately since surgery, some of her radicular symptoms have come back.  Patient is awaiting spine consult as some of her symptoms remain and has recently presented with step off deformity in spine.      Pt will benefit from skilled PT to address impairments, improve functional mobility, and optimize movement  strategies to avoid exacerbation of symptoms.        Rehab Potential  Excellent     Row Name 11/01/18 1100          Patient stated Goal    Patient stated goal  less pain, more active 30 min exercises     Row Name 11/01/18 1100          Goal 1 (Short Term)    Impairment  Education need     Education need  Patient able to return demonstrate Home Exercise Program independently to enable patient to achieve stated functional goals     Custom goal  to work on trunk stability and control with bending, lifting, and squatting.      Number of visits  5-7     Goal Status  New     Row Name 11/01/18 1100          Goal 2 (Short Term)    Impairment  Balance impairment     Balance impairment  To improve postural control and safety in standing, patient able to maintain static standing balance without handhold support and limited postural sway     Custom goal  1 min     Number of visits  7-10     Goal Status  New     Row Name 11/01/18 1100          Goal 3 (Short Term)    Impairment  Activity Tolerance Limitation     Custom goal  Pt will be able to perform 30 min strnegth and conditioning workout without flare up of back symptoms.      Number of visits  7-10     Goal Status  New     Row Name 11/01/18 1100  GOAL (Long Term)    Long Term Goal  Activity Tolerance Limitation     Activity tolerance  -- Oswestry     Long Term Custom Goal  Pt will achieve 30% MCID score on Oswestry indicating improvement in back disability.      Long Term Goal Number of visits  10-14     Long Term Goal Status  New     Row Name 11/01/18 1100          Planned Therapy Interventions and Rationale    Manual Therapy  to improve joint and soft tissue alignment;to decrease tissue tension and restriction;to decrease pain;to decrease muscle spasm     Neuromuscular Re-Education  to improve postural control;to improve kinesthetic awareness;to improve coordination;to improve balance;to improve proprioception     Patient Education  to minimize re-injury;to  increase safety during dynamic activities;to increase independence with correct body mechanics technique during functional activities;to increase independence in functional activities;to improve compliance with independent home exercise program     Therapeutic Activities  to improve ability to change body position;to improve functional mobility;to improve transfers between surfaces     Theraputic Exercise  to increase strength;to increase range of motion;to improve posture;to increase flexibility;to improve activity tolerance     Row Name 11/01/18 1100          Treatment Plan    Continue therapy to address   Activity tolerance limitation;Pain;Range of motion limitation     Frequency of treatment  1 time per week     Duration of treatment  12 visits     Status of treatment  Patient evaluated and will benefit from ongoing skilled therapy     Row Name 11/01/18 1100          Treatment Plan Disussion    Include in My Healthcare  My healthcare team;Myself     Treatment Plan Discussion & Agreement  Patient     Patient/Family Questions  Yes - All questions asked & answered     Row Name 11/01/18 1100          Outpatient Therapy Overview    Start of Care  11/01/18     Row Name 11/01/18 1400          MediCal Evaluation    Medi-Cal Eval Initial 30 minutes (3920)  Completed     Medi-Cal eval 15 min addtl [phone])            2     Row Name 11/01/18 1400          MediCAL Treatment today    Therapeutic exercise    Core stabilization exercises       Therapeutic exercise    pelvic tilt 2x10; pelvic tilt marching 2x10; quadriped alt LE 2x10 bending, lifting, twisting safety     Row Name 11/01/18 1100          Treatment Time     Total TIMED Treatment  (min)  60     Total Treatment Time (min)  60             The physical therapist of record is endorsed by evaluating physical therapist.

## 2018-11-03 ENCOUNTER — Ambulatory Visit (HOSPITAL_BASED_OUTPATIENT_CLINIC_OR_DEPARTMENT_OTHER): Payer: Medicaid Other

## 2018-11-04 ENCOUNTER — Encounter (HOSPITAL_BASED_OUTPATIENT_CLINIC_OR_DEPARTMENT_OTHER): Payer: Self-pay | Admitting: Hospital

## 2018-11-06 NOTE — Telephone Encounter (Signed)
CT report received via right fax. Report has been uploaded into media.

## 2018-11-07 NOTE — Telephone Encounter (Signed)
Thanks

## 2018-11-09 ENCOUNTER — Other Ambulatory Visit (INDEPENDENT_AMBULATORY_CARE_PROVIDER_SITE_OTHER): Payer: Self-pay | Admitting: Hospitalist

## 2018-11-09 ENCOUNTER — Other Ambulatory Visit: Payer: Self-pay

## 2018-11-09 ENCOUNTER — Ambulatory Visit: Payer: Medicaid Other | Attending: Internal Medicine | Admitting: Orthopedics

## 2018-11-09 ENCOUNTER — Encounter (HOSPITAL_BASED_OUTPATIENT_CLINIC_OR_DEPARTMENT_OTHER): Payer: Self-pay | Admitting: Orthopedics

## 2018-11-09 VITALS — BP 113/78 | HR 83 | Temp 97.8°F | Ht 70.5 in | Wt 176.0 lb

## 2018-11-09 DIAGNOSIS — M5440 Lumbago with sciatica, unspecified side: Secondary | ICD-10-CM

## 2018-11-09 DIAGNOSIS — M549 Dorsalgia, unspecified: Secondary | ICD-10-CM | POA: Insufficient documentation

## 2018-11-09 DIAGNOSIS — M5441 Lumbago with sciatica, right side: Secondary | ICD-10-CM

## 2018-11-09 MED ORDER — ONDANSETRON 8 MG OR TBDP
8.0000 mg | ORAL_TABLET | Freq: Three times a day (TID) | ORAL | 0 refills | Status: DC | PRN
Start: 2018-11-09 — End: 2019-01-07

## 2018-11-09 NOTE — Patient Instructions (Addendum)
Call [name] to schedule surgery with Dr Zlomislic please

## 2018-11-09 NOTE — Telephone Encounter (Signed)
Signed. thanks

## 2018-11-09 NOTE — Telephone Encounter (Signed)
Last Office Visit: 10/31/2018 w/ NP Pernell Dupre  Next Office Visit: 12/26/2018 w/ MD Asghar   Return In Clinic: Return in about 4 weeks (around 11/28/2018) for f/u with pcp per last AVS      Medication Request: ondansetron (ZOFRAN ODT) 8 MG disintegrating tablet    Last Filled: 08/16/2018      Pertinent Labs: n/a

## 2018-11-09 NOTE — Telephone Encounter (Signed)
ondansetron  is not currently included in the Pharmacy Refill Clinic protocols. Re-routing to the responsible staff for processing.  Thank you!

## 2018-11-10 ENCOUNTER — Ambulatory Visit
Payer: Medicaid Other | Attending: Rehabilitative and Restorative Service Providers" | Admitting: Rehabilitative and Restorative Service Providers"

## 2018-11-10 DIAGNOSIS — M4316 Spondylolisthesis, lumbar region: Secondary | ICD-10-CM

## 2018-11-10 DIAGNOSIS — M549 Dorsalgia, unspecified: Principal | ICD-10-CM | POA: Insufficient documentation

## 2018-11-10 DIAGNOSIS — G8929 Other chronic pain: Secondary | ICD-10-CM

## 2018-11-10 DIAGNOSIS — M545 Low back pain: Secondary | ICD-10-CM

## 2018-11-10 NOTE — Interdisciplinary (Signed)
Physical Therapy  Daily Follow Up Note    Referring Physician Sani, Armelia    Diagnosis     ICD-10-CM ICD-9-CM    1. Back pain, unspecified back location, unspecified back pain laterality, unspecified chronicity M54.9 724.5    2. Chronic bilateral low back pain without sciatica M54.5 724.2     G89.29 338.29    3. Spondylolisthesis of lumbar region M43.16 738.4        Preferred Language:English                   Past Medical History:   Diagnosis Date   . ADD (attention deficit disorder)    . Clavicular fracture       Past Surgical History:   Procedure Laterality Date   . HUMERUS FRACTURE SURGERY Left 1993   . ------------OTHER-------------  38 yo    humerus ORIF        Physical Therapy Evaluation     Row Name 11/10/18 1100          Subjective    Subjective Information  Pt able to stand upright.  Ended up with knee pain trying to do pelvic tilt.       Row Name 11/10/18 1100          Pain Assessment    Pain Asssessment Tool  Numeric Pain Rating Scale     Row Name 11/10/18 1100          Numeric Pain Rating Scale     Pain Intensity - rating at present  4     Pain Intensity - rating at worst   8     Frequency   Constant     Description   Dull;Aching;Sharp;Radiating     Pain Description  premedicated; worse with sitting and walking community 476ft                                -     Row Name 11/10/18 1100          Patient/Family Education    Learner(s)  Patient     Patient/family training in appropriate therapeutic interventions  Ongoing     Education Topic(s)  Mobility;Exercise Program     Row Name 11/10/18 1100          Assessment     Assessment   30 minute workout program with very light resistance used today. Patient presents with notable fear of exacerbation of her symptoms in back. Trunk stability and strength are impaired. Patient educated patient that just because she had surgery to stabilize her back does not mean that her trunk muscles are doing its job.       Recommended patient to see pain  psychologist.  Progress trunk stabilization program as tolerated.       Rehab Potential  Excellent     Row Name 11/10/18 1100          Patient stated Goal    Patient stated goal  less pain, more active 30 min exercises     Row Name 11/10/18 1100          Treatment Plan    Continue therapy to address   Activity tolerance limitation;Pain;Range of motion limitation     Frequency of treatment  1 time per week     Duration of treatment  11 visits     Status of treatment  Patient evaluated and will benefit from ongoing  skilled therapy     Row Name 11/10/18 1100          MediCal Treatment completed today    MediCal Treatment initial 30 minutes [phone])  Completed     Row Name 11/10/18 1100          MediCAL Treatment today    Therapeutic exercise    Core stabilization exercises       Therapeutic exercise    shoulder extension 5-8 lbs 2x10; pallof press 4lbs x10; pallof march 4lbsx10; leg press  3x10 20lbs; side stepping in plane Rotonda TB 2x10l hamstring stretch x30" HF stretch x30"; adductor stretch 30"     Row Name 11/10/18 1100          Treatment Time     Total TIMED Treatment  (min)  30     Total Treatment Time (min)  30             The physical therapist of record is endorsed by evaluating physical therapist.

## 2018-11-11 ENCOUNTER — Telehealth (HOSPITAL_BASED_OUTPATIENT_CLINIC_OR_DEPARTMENT_OTHER): Payer: Self-pay | Admitting: Orthopedics

## 2018-11-11 NOTE — Telephone Encounter (Signed)
Sent to provider for peer to peer

## 2018-11-11 NOTE — Telephone Encounter (Signed)
Who is calling: Marylu Lund from [facility] Coverage Verified: [facility]    Reason for this call:   MRI LUMBAR SPINE W/O CONTRAST has been denied by [facility]o       Possible peer to peer best Contact # for Luther Redo [phone] Option 1       Action required by office: No action needed. Routing to clinic as FYI         Best way to contact: If needed to Contact Marylu Lund from [facility]oral[facility] Champagne  best contact # is [phone] or Luther Redo at [phone] Option 1        Has been advised this message will be transmitted to office and can expect a response within the next 24-72 hours.

## 2018-11-12 ENCOUNTER — Encounter (INDEPENDENT_AMBULATORY_CARE_PROVIDER_SITE_OTHER): Payer: Self-pay | Admitting: Hospitalist

## 2018-11-12 DIAGNOSIS — L709 Acne, unspecified: Secondary | ICD-10-CM

## 2018-11-14 ENCOUNTER — Other Ambulatory Visit (INDEPENDENT_AMBULATORY_CARE_PROVIDER_SITE_OTHER): Payer: Medicaid Other

## 2018-11-14 NOTE — Telephone Encounter (Addendum)
Last Office Visit: 10/31/18  Next Office Visit: 12/26/18  Mychart message routed to PCP for review and approval:      Hi,     I was wondering if you could fill a prescription for me for topical clindamycin. I've used it in the past, but don't have any currently and my skin has been really broken out since I had surgery.    Thanks,  [name]

## 2018-11-16 ENCOUNTER — Encounter (HOSPITAL_BASED_OUTPATIENT_CLINIC_OR_DEPARTMENT_OTHER): Payer: Self-pay | Admitting: Hospital

## 2018-11-17 ENCOUNTER — Ambulatory Visit (HOSPITAL_BASED_OUTPATIENT_CLINIC_OR_DEPARTMENT_OTHER): Payer: Medicaid Other | Admitting: Rehabilitative and Restorative Service Providers"

## 2018-11-17 MED ORDER — CLINDAMYCIN PHOSPHATE 1 % EX SOLN
1.0000 | Freq: Two times a day (BID) | CUTANEOUS | 2 refills | Status: DC
Start: 2018-11-17 — End: 2019-11-02

## 2018-11-22 ENCOUNTER — Encounter (INDEPENDENT_AMBULATORY_CARE_PROVIDER_SITE_OTHER): Payer: Medicaid Other | Admitting: Nurse Practitioner

## 2018-11-22 NOTE — Progress Notes (Signed)
ORTHOPAEDIC SPINE SURGERY     Visit Type: Office Visit    Reason For Visit: Recheck  Procedure:   07/27/2018  L4-5 posterior decompression and instrumentation with mobile instrumented device (Premix Nexux System)  Procedure in [place]     History Of Present Illness: 38 year old female s/p L4-5 posterior decompression and mobile instrumentation in [place] 07/27/18. She was last seen in clinic on 09/28/18 when she was establishing post op care back in the [place]. At the time she was doing well, the plan was to continue LSO and start PT end of February. Patient presents today due to worsening low back and leg pain.Describes numbness and stabbing on medial aspect of BL feet. She feels she has a bump above her incision where the pain is located. She describes the pain as burning x 1 month. She has been feeling off balance but no overt leg weakness. She has stopped wearing her brace as instructed, she tried PT but it was too painful.  Patient reports no changes in bowel and bladder function. Patient returns symptoms.    Allergies:   Allergies   Allergen Reactions   . Tramadol Nausea and Vomiting       Medications:   Current Outpatient Medications   Medication Sig   . Amphetamine-Dextroamphetamine (ADDERALL PO) as needed.   . Calcium Carb-Cholecalciferol (CALCIUM 1000 + D PO)    . clindamycin (CLEOCIN T) 1 % solution Apply 1 Application topically 2 times daily. Use a small amount as directed   . cyclobenzaprine (FLEXERIL) 10 MG tablet 1/2 to 1 tab HS PRN pain/muscle spasm   . drospirenone-ethinyl estradiol (YAZ) 3-0.02 MG tablet Take 1 tablet by mouth daily.   Marland Kitchen gabapentin (NEURONTIN) 100 MG capsule Start 1 capsule HS slowly increase to 2 and then 3 capsules HS as tolerated   . ondansetron (ZOFRAN ODT) 8 MG disintegrating tablet Take 1 tablet (8 mg) by mouth every 8 hours as needed for Nausea/Vomiting.   Marland Kitchen oxyCODONE-acetaminophen (PERCOCET) 5-325 MG tablet Take 1 tablet by mouth every 4 hours as needed for Severe Pain (Pain  Score 7-10).   . TURMERIC PO    . zolpidem (AMBIEN) 5 MG tablet Take 1 tablet (5 mg) by mouth nightly as needed for Insomnia.     No current facility-administered medications for this visit.          Review of Systems:  CONSTITUTIONAL: No unexpected weight loss, fevers, chills, or anorexia. SKIN: Noncontributory. GENITOURINARY: Symptoms as noted above in HPI. NEUROLOGIC: Symptoms as noted above in HPI. MUSCULOSKELETAL: Symptoms as noted above in HPI.    Physical Examination:  BP 113/78   Pulse 83   Temp 97.8 F (36.6 C) (Oral)   Ht 5' 10.5" (1.791 m)   Wt 79.8 kg (176 lb)   BMI 24.90 kg/m   CONSTITUTIONAL: Well appearing and well groomed. PSYCHOLOGICAL: Alert and oriented and appropriate to situation. INTEGUMENT: Intact. VASCULAR: Radial and pedal pulses are intact and symmetric. EXTREMITIES: Bilateral upper and lower extremities have good range of motion and no significant deformities. GAIT: Brisk with good coordination.      LUMBAR SPINE  TTP.Possible fluid collection/scar tissue above incision Heel to toe walk without deficit. Sensation intact of the lower extremities. Skin intact, incision well healed.    MOTOR STRENGTH       RT  LT  Iliopsoas   5/5  5/5  Quadricep   5/5  5/5  Anterior tibialis   5/5  5/5  Extensor hallucis longus  5/5  5/5  Gastrocsoleus  5/5  5/5    DEEP TENDON REFLEXES       RT  LT  Patellar   2+  2+  Achilles   2+  2+    PATHOLOGIC REFLEXES      RT LT  Clonus    Neg Neg  Babinski   Neg Neg  Hoffmans   POS Neg    STRAIGHT LEG RAISING       RT  LT  Sitting @ 90 degrees   Neg Neg    IMAGING STUDIES:   CT/MRI lumbar spine OSH    OTHER MEDICAL RECORDS/IMAGING STUDIES REVIEWED: None    IMPRESSION:   Encounter Diagnoses   Name Primary?   . Back pain, unspecified back location, unspecified back pain laterality, unspecified chronicity Yes    s/p above surgery with persistent symptoms    PLAN/DISCUSSION: The plan is for surgery-   1. Removal of hardware and upsize screws with possible cement  augmentation  2. XLIF L4/5  3. Posterior fusion and decompression  **will use 15 degree titanium nuvasive cage   All questions were answered and Haley Barrett understood and was satisfied with this plan.     FOLLOWUP: Return to clinic: postop. The patient is encouraged to call us with any questions or problems in the interim. Our contact numbers were given to the patient.

## 2018-11-23 ENCOUNTER — Inpatient Hospital Stay (INDEPENDENT_AMBULATORY_CARE_PROVIDER_SITE_OTHER): Admit: 2018-11-23 | Discharge: 2018-11-23 | Disposition: A | Discharge status: Home Health | Payer: Medicaid Other

## 2018-11-23 ENCOUNTER — Ambulatory Visit (INDEPENDENT_AMBULATORY_CARE_PROVIDER_SITE_OTHER): Payer: Medicaid Other | Admitting: Orthopaedic Surgery of the Spine

## 2018-11-23 ENCOUNTER — Encounter (INDEPENDENT_AMBULATORY_CARE_PROVIDER_SITE_OTHER): Payer: Self-pay | Admitting: Orthopaedic Surgery of the Spine

## 2018-11-23 VITALS — BP 126/82 | HR 97 | Temp 98.0°F | Ht 70.0 in | Wt 176.0 lb

## 2018-11-23 DIAGNOSIS — M5442 Lumbago with sciatica, left side: Secondary | ICD-10-CM

## 2018-11-23 DIAGNOSIS — G8929 Other chronic pain: Secondary | ICD-10-CM

## 2018-11-23 DIAGNOSIS — Z981 Arthrodesis status: Secondary | ICD-10-CM

## 2018-11-23 DIAGNOSIS — R202 Paresthesia of skin: Secondary | ICD-10-CM

## 2018-11-23 DIAGNOSIS — M5412 Radiculopathy, cervical region: Secondary | ICD-10-CM

## 2018-11-23 DIAGNOSIS — M5441 Lumbago with sciatica, right side: Secondary | ICD-10-CM

## 2018-11-23 DIAGNOSIS — M48061 Spinal stenosis, lumbar region without neurogenic claudication: Secondary | ICD-10-CM

## 2018-11-23 DIAGNOSIS — R29818 Other symptoms and signs involving the nervous system: Secondary | ICD-10-CM

## 2018-11-23 DIAGNOSIS — R200 Anesthesia of skin: Secondary | ICD-10-CM

## 2018-11-23 DIAGNOSIS — M50323 Other cervical disc degeneration at C6-C7 level: Secondary | ICD-10-CM

## 2018-11-23 MED ORDER — OXYCODONE-ACETAMINOPHEN 5-325 MG OR TABS
1.0000 | ORAL_TABLET | Freq: Four times a day (QID) | ORAL | 0 refills | Status: DC | PRN
Start: 2018-11-23 — End: 2019-01-07

## 2018-11-23 MED ORDER — ZOLPIDEM TARTRATE 5 MG OR TABS
5.0000 mg | ORAL_TABLET | Freq: Every evening | ORAL | 0 refills | Status: DC | PRN
Start: 2018-11-23 — End: 2018-12-23

## 2018-11-24 ENCOUNTER — Encounter (INDEPENDENT_AMBULATORY_CARE_PROVIDER_SITE_OTHER): Payer: Self-pay | Admitting: Orthopaedic Surgery of the Spine

## 2018-11-24 ENCOUNTER — Encounter (HOSPITAL_BASED_OUTPATIENT_CLINIC_OR_DEPARTMENT_OTHER): Payer: Self-pay | Admitting: Hospital

## 2018-11-24 ENCOUNTER — Ambulatory Visit (HOSPITAL_BASED_OUTPATIENT_CLINIC_OR_DEPARTMENT_OTHER): Payer: Medicaid Other | Admitting: Rehabilitative and Restorative Service Providers"

## 2018-11-24 NOTE — Telephone Encounter (Signed)
Two messages sent one regarding specific PT for lumbar fusion and:       Oh and sorry I also wanted to ask if Doc Z or if it's on the chart if someone could tell me which anatomy he said was taken out during my last surgery? I believe he said there was a tendon or ligament missing it looked like and I wanted to ask the people who did the surgery about why they took that out but don't know enough to really be able to ask them about it.

## 2018-11-24 NOTE — Telephone Encounter (Signed)
This mychart message was added to one regarding PT

## 2018-11-24 NOTE — Telephone Encounter (Signed)
From: [name]  To: [name], MD  Sent: 11/24/2018 26:71 AM PDT  Subject: 20-Other    Oh and sorry I also wanted to ask if Doc Z or if it's on the chart if someone could tell me which anatomy he said was taken out during my last surgery? I believe he said there was a tendon or ligament missing it looked like and I wanted to ask the people who did the surgery about why they took that out but don't know enough to really be able to ask them about it.

## 2018-11-24 NOTE — Telephone Encounter (Signed)
From: [name]  To: [name], MD  Sent: 11/24/2018 25:00 AM PDT  Subject: 1-Non Urgent Medical Advice    Hi,    I know you guys must be swamped right now, but I forgot to ask Doctor Z yesterday if he happens to have any recommendations on physical therapists to work with for recovery from fusion, or just to work with in general of course. I'm hoping to also maybe get in before surgery to do "pre-hab" as well.    Thanks!  Haley Barrett

## 2018-11-25 ENCOUNTER — Encounter (INDEPENDENT_AMBULATORY_CARE_PROVIDER_SITE_OTHER): Payer: Self-pay | Admitting: Hospitalist

## 2018-11-25 DIAGNOSIS — Z791 Long term (current) use of non-steroidal anti-inflammatories (NSAID): Secondary | ICD-10-CM

## 2018-11-25 NOTE — Telephone Encounter (Signed)
Patient requesting CBC or test that checks her Kidneys.    Routing my chart message to PCP.   Order pended.

## 2018-11-25 NOTE — Telephone Encounter (Signed)
Hi.  I think this has been taken care off.  I called pt but LM, no answer.  Last note reports OSH CT/MRI done and recommendation for revision lumbar surgery.  Thanks.

## 2018-11-25 NOTE — Telephone Encounter (Signed)
From: Haley Barrett  To: Meredeth Ide, MD  Sent: 11/25/2018 12:48 PM PDT  Subject: 1-Non Urgent Medical Advice    Hi Doctor Asghar,     I was wondering if you could order a CBC for me as well as possibly a blood test that checks on my kidneys if the CBC doesn't check that? I've been on a pretty high does of NSAIDS for about a year now. It was suggested to me to maybe have the CBC since I'm having issue with my back a few months after back surgery. I was hoping to mitigate actually having to come in to the office if possible and just go in for the bloodwork. Let me know if that would work?    Thanks,  [name]

## 2018-11-28 ENCOUNTER — Telehealth (INDEPENDENT_AMBULATORY_CARE_PROVIDER_SITE_OTHER): Payer: Self-pay | Admitting: Nurse Practitioner

## 2018-11-28 ENCOUNTER — Encounter (INDEPENDENT_AMBULATORY_CARE_PROVIDER_SITE_OTHER): Payer: Self-pay | Admitting: Nurse Practitioner

## 2018-11-28 ENCOUNTER — Encounter (INDEPENDENT_AMBULATORY_CARE_PROVIDER_SITE_OTHER): Payer: Self-pay | Admitting: Hospital

## 2018-11-28 ENCOUNTER — Other Ambulatory Visit: Payer: Self-pay

## 2018-11-28 ENCOUNTER — Telehealth (INDEPENDENT_AMBULATORY_CARE_PROVIDER_SITE_OTHER): Payer: Medicaid Other | Admitting: Nurse Practitioner

## 2018-11-28 DIAGNOSIS — K219 Gastro-esophageal reflux disease without esophagitis: Principal | ICD-10-CM

## 2018-11-28 DIAGNOSIS — L709 Acne, unspecified: Secondary | ICD-10-CM

## 2018-11-28 MED ORDER — FAMOTIDINE 20 MG OR TABS
20.0000 mg | ORAL_TABLET | Freq: Two times a day (BID) | ORAL | 0 refills | Status: DC
Start: 2018-11-28 — End: 2018-12-26

## 2018-11-28 NOTE — Telephone Encounter (Signed)
Attempted to call patient no answer, LVM with return contact information.     Call center if patient calls please advise we are changing to Video/Telephone visit, if he agrees to change appointment.

## 2018-11-28 NOTE — Telephone Encounter (Signed)
plz call pt with derm referral info, thx

## 2018-11-28 NOTE — Patient Instructions (Signed)

## 2018-11-28 NOTE — Progress Notes (Addendum)
Haley Barrett  MRN: [id number]  DOB: Aug 10, 1981  PMD: Haley Barrett    ---------------------(data below generated by Haley Dill, NP)--------------------    Patient Verification & Telemedicine Consent:    I am proceeding with this evaluation at the direct request of the patient.  I have verified this is the correct patient and have obtained verbal consent and written consent from the patient/ surrogate to perform this voluntary telemedicine evaluation (including obtaining history, performing examination and reviewing data provided by the patient).   The patient/ surrogate has the right to refuse this evaluation.  I have explained risks (including potential loss of confidentiality), benefits, alternatives, and the potential need for subsequent face to face care. Patient/ surrogate understands that there is a risk of medical inaccuracies given that our recommendations will be made based on reported data (and we must therefore assume this information is accurate).  Knowing that there is a risk that this information is not reported accurately, and that the telemedicine video, audio, or data feed may be incomplete, the patient agrees to proceed with evaluation and holds [place] harmless knowing these risks. In this evaluation, we will be providing recommendations only. The patient/ surrogate has been notified that other healthcare professionals (including students, residents and Engineer, maintenance) may be involved in this audio-video evaluation.   All laws concerning confidentiality and patient access to medical records and copies of medical records apply to telemedicine.  The patient/ surrogate has received the [place] Notice of Privacy Practices.  I have reviewed this above verification and consent paragraph with the patient/ surrogate.  If the patient is not capacitated to understand the above, and no surrogate is available, since this is not an emergency evaluation, the visit will be rescheduled until such time that  the patient can consent, or the surrogate is available to consent.    Demographics:   Medical Record #: [id number]   Date: November 30, 2018   Patient Name: Haley Barrett   DOB: 10/11/1980  Age: 38 year old  Sex: female  Location: Home address on file    Evaluator(s):   Haley Barrett was evaluated by me today.    Clinic Location: [facility]  [facility]  [address]  [place] [address]        Chief Complaint: nausea, acne    HPI:  Haley Barrett is a 38 year old female  who presents with c/o feeling nausea since recent back surgery under ortho care, no vomiting, takes percocet "only at night" and tolerated in past without nausea.  Takes ondansetron prn no vomiting.  Denies pregnancy  - on OC's and due to sxs hasn't had sex in past year, LMP very light a couple of weeks ago.   Pt also noticed some GERD sxs no tx tried.  Not smoking, minimal etoh, little caffeine, has been taking naproxen off/on, under stress.  Also c/o persistent adult acne breakouts on face, neck despite topical clindamycin.    PCP is Dr. Gerri Barrett, utd on pap, flu shot, needs tdap    Past Medical History:   Diagnosis Date   . ADD (attention deficit disorder)    . Clavicular fracture      Past Surgical History:   Procedure Laterality Date   . BACK SURGERY  07/28/2018    In [place]   . HUMERUS FRACTURE SURGERY Left 1993   . ------------OTHER-------------  38 yo    humerus ORIF      Social History  Socioeconomic History   . Marital status: Single     Spouse name: Not on file   . Number of children: Not on file   . Years of education: Not on file   . Highest education level: Not on file   Occupational History   . Not on file   Social Needs   . Financial resource strain: Not on file   . Food insecurity:     Worry: Not on file     Inability: Not on file   . Transportation needs:     Medical: Not on file     Non-medical: Not on file   Tobacco Use   . Smoking status: Never Smoker   . Smokeless tobacco: Never Used   Substance  and Sexual Activity   . Alcohol use: Yes     Comment: socially, couple drinks/week    . Drug use: Yes     Types: Marijuana     Comment: edibles   . Sexual activity: Not Currently     Partners: Male     Birth control/protection: Pill   Lifestyle   . Physical activity:     Days per week: Not on file     Minutes per session: Not on file   . Stress: Not on file   Relationships   . Social connections:     Talks on phone: Not on file     Gets together: Not on file     Attends religious service: Not on file     Active member of club or organization: Not on file     Attends meetings of clubs or organizations: Not on file     Relationship status: Not on file   . Intimate partner violence:     Fear of current or ex partner: Not on file     Emotionally abused: Not on file     Physically abused: Not on file     Forced sexual activity: Not on file   Other Topics Concern   . Not on file   Social History Narrative    Single    Psychotherapist - just beginning, in private practice. Went to school in [place].    Born in [place] and moved to [place] 6 years ago    Tour guide-food and history tours     Hobbies: beach volleyball, horse back riding, exercise      Family History   Problem Relation Name Age of Onset   . Diabetes Paternal Uncle     . Breast Cancer Paternal Aunt     . Breast Cancer Mother  56   . Breast Cancer Paternal Grandmother     . Colon Cancer Neg Hx     . Hypertension Neg Hx     . Cholesterol/Lipid Disorder Neg Hx       Current medications:  Current Outpatient Medications   Medication Sig   . Amphetamine-Dextroamphetamine (ADDERALL PO) as needed.   . Calcium Carb-Cholecalciferol (CALCIUM 1000 + D PO)    . clindamycin (CLEOCIN T) 1 % solution Apply 1 Application topically 2 times daily. Use a small amount as directed   . cyclobenzaprine (FLEXERIL) 10 MG tablet 1/2 to 1 tab HS PRN pain/muscle spasm   . drospirenone-ethinyl estradiol (YAZ) 3-0.02 MG tablet Take 1 tablet by mouth daily.   . famotidine (PEPCID) 20  MG tablet Take 1 tablet (20 mg) by mouth 2 times daily.   Marland Kitchen gabapentin (NEURONTIN) 100 MG capsule Start 1  capsule HS slowly increase to 2 and then 3 capsules HS as tolerated   . ondansetron (ZOFRAN ODT) 8 MG disintegrating tablet Take 1 tablet (8 mg) by mouth every 8 hours as needed for Nausea/Vomiting.   Marland Kitchen oxyCODONE-acetaminophen (PERCOCET) 5-325 MG tablet Take 1 tablet by mouth every 6 hours as needed for Severe Pain (Pain Score 7-10).   . TURMERIC PO    . zolpidem (AMBIEN) 5 MG tablet Take 1 tablet (5 mg) by mouth nightly as needed for Insomnia.     No current facility-administered medications for this visit.        Review of Systems   Constitutional: Negative for fever.   HENT: Negative for congestion.    Eyes: Negative for redness.   Respiratory: Negative for cough.    Cardiovascular: Negative for chest pain.   Gastrointestinal: Positive for abdominal pain and nausea. Negative for vomiting.        Heartburn   Endocrine: Negative for polyuria.   Genitourinary: Negative for dysuria.   Musculoskeletal: Positive for back pain.   Skin: Negative for rash.   Allergic/Immunologic: Negative for immunocompromised state.   Neurological: Negative for dizziness.   Hematological: Negative for adenopathy.   Psychiatric/Behavioral: Negative for agitation.        Physical Examination: video visit  General:  Well appearing, no distress  Derm:  Several facial comedones.      Assessment/Plan:     Nausea likely 2nd to GERD, enc. Bland diet and trial H2 blocker.  CBC and CMP on pt concern to make sure no GI bleed, no renal impact from chronic nsaid use.  Will refer to derm for adult acne lesions not well controlled.      ICD-10-CM ICD-9-CM    1. Gastroesophageal reflux disease, esophagitis presence not specified K21.9 530.81 famotidine (PEPCID) 20 MG tablet      CBC w/ Diff Lav[place]      Comprehensive Metabolic Panel Green   2. Adult acne L70.9 706.1 Dermatology Clinic       See patient d/c instructions provided  Return/ED  precautions reviewed with pt - follow up PRN sxs persist/worsen/new sxs develop  Patient (or responsible party) verbalizes understanding of plan  F/u with pcp for routine care     Supervising MD for clinical site where visit took place today:  Dr. Ileene Rubens

## 2018-11-28 NOTE — Telephone Encounter (Signed)
For Dr. Herma Carson to answer

## 2018-11-29 ENCOUNTER — Telehealth (HOSPITAL_BASED_OUTPATIENT_CLINIC_OR_DEPARTMENT_OTHER): Payer: Self-pay | Admitting: Rehabilitative and Restorative Service Providers"

## 2018-11-29 ENCOUNTER — Encounter (HOSPITAL_BASED_OUTPATIENT_CLINIC_OR_DEPARTMENT_OTHER): Payer: Self-pay | Admitting: Hospital

## 2018-11-29 ENCOUNTER — Other Ambulatory Visit (INDEPENDENT_AMBULATORY_CARE_PROVIDER_SITE_OTHER): Payer: Self-pay | Admitting: Hospitalist

## 2018-11-29 DIAGNOSIS — M549 Dorsalgia, unspecified: Secondary | ICD-10-CM | POA: Insufficient documentation

## 2018-11-29 DIAGNOSIS — M48061 Spinal stenosis, lumbar region without neurogenic claudication: Secondary | ICD-10-CM

## 2018-11-29 NOTE — Telephone Encounter (Signed)
Prescription Refill Request Received via fax- Non protocol per refill clinic.     Last visit Date:  10/31/2018  Last Refill request per fax:  10/31/2018  Last No Show or Cancel appointment: 11/22/2018   Next OV:   12/26/2018    Medications:   cyclobenzaprine (FLEXERIL) 10 MG tablet      Name of Pharmacy in patients demographics? Yes  If not please update patient's pharmacy demographics.

## 2018-11-29 NOTE — Telephone Encounter (Signed)
Left voicemail to determine if they would like to continue physical therapy or hold for now. Informed patient that we are still offering rehab services in [place]. I have offered the potential for telemedicine visits.     1) If patient confirms they are interested in telemedicine, please document telephone encounter and route conversation to Chris Rudd.  2) If patient confirms appointment but not interested in telemedicine, please update appointment notes indicating confirmation.

## 2018-11-29 NOTE — Telephone Encounter (Signed)
Medication requested: cyclobenzaprine (FLEXERIL) 10 MG tablet       Amount refilled: 30 with 0 refill/s  Date last filled: 10/31/2018  Pertinent Lab/s:    Last office visit in clinic::10/31/2018  Return To Clinic:   Next office visit in clinic:12/26/2018    FRONT DESK ACTION NEEDED:       Pended and routed to PCP.

## 2018-11-29 NOTE — Telephone Encounter (Signed)
Message sent to pt. Via mychart with referral information. No further action needed, will close encounter.-M. Jubilee Vivero, MA

## 2018-11-30 ENCOUNTER — Telehealth (HOSPITAL_BASED_OUTPATIENT_CLINIC_OR_DEPARTMENT_OTHER): Payer: Self-pay | Admitting: Orthopaedic Surgery of the Spine

## 2018-11-30 ENCOUNTER — Other Ambulatory Visit (INDEPENDENT_AMBULATORY_CARE_PROVIDER_SITE_OTHER): Payer: Self-pay | Admitting: Hospitalist

## 2018-11-30 DIAGNOSIS — M48061 Spinal stenosis, lumbar region without neurogenic claudication: Secondary | ICD-10-CM

## 2018-11-30 NOTE — Telephone Encounter (Signed)
Medication requested:gabapentin (NEURONTIN) 100 MG capsule       Amount refilled: 90 with 0 refill/s  Date last filled: 10/31/2018  Pertinent Lab/s:    Last office visit in clinic::10/31/2018  Return To Clinic:   Next office visit in clinic:12/26/2018    FRONT DESK ACTION NEEDED:       Pended and routed to PCP.

## 2018-11-30 NOTE — Telephone Encounter (Signed)
Returned pt call and left msg. She states she has questions. Wants to schedule sx with Dr. Christa See. Routing msg to DR. Z for sx order.

## 2018-11-30 NOTE — Telephone Encounter (Signed)
Prescription Refill Request Received via fax- Non protocol per refill clinic.     Last visit Date:  10/31/2018  Last Refill request per fax:  10/31/2018  Last No Show or Cancel appointment:  12/06/2018  Next OV:  12/26/2018    Medications:   gabapentin (NEURONTIN) 100 MG capsule        Name of Pharmacy in patients demographics? Yes  If not please update patient's pharmacy demographics.

## 2018-12-01 ENCOUNTER — Ambulatory Visit (HOSPITAL_BASED_OUTPATIENT_CLINIC_OR_DEPARTMENT_OTHER): Payer: Medicaid Other | Admitting: Rehabilitative and Restorative Service Providers"

## 2018-12-01 MED ORDER — GABAPENTIN 100 MG OR CAPS
ORAL_CAPSULE | ORAL | 0 refills | Status: DC
Start: 2018-12-01 — End: 2019-01-07

## 2018-12-01 MED ORDER — CYCLOBENZAPRINE HCL 10 MG OR TABS
ORAL_TABLET | ORAL | 0 refills | Status: DC
Start: 2018-12-01 — End: 2019-01-07

## 2018-12-01 NOTE — Telephone Encounter (Signed)
Signed. thanks

## 2018-12-02 ENCOUNTER — Telehealth (HOSPITAL_BASED_OUTPATIENT_CLINIC_OR_DEPARTMENT_OTHER): Payer: Self-pay

## 2018-12-02 NOTE — Telephone Encounter (Signed)
I have attempted to reach the patient and schedule a therapy appointment. I was able to leave a message and provide our main line [phone], so the patient may call us back to schedule at their earliest convenience.      Patient is wanting tele-health e-visit

## 2018-12-06 ENCOUNTER — Encounter (INDEPENDENT_AMBULATORY_CARE_PROVIDER_SITE_OTHER): Payer: Medicaid Other | Admitting: Nurse Practitioner

## 2018-12-06 DIAGNOSIS — M48061 Spinal stenosis, lumbar region without neurogenic claudication: Secondary | ICD-10-CM

## 2018-12-06 DIAGNOSIS — Z981 Arthrodesis status: Secondary | ICD-10-CM

## 2018-12-06 DIAGNOSIS — M5442 Lumbago with sciatica, left side: Secondary | ICD-10-CM

## 2018-12-06 DIAGNOSIS — G8929 Other chronic pain: Secondary | ICD-10-CM

## 2018-12-06 DIAGNOSIS — M5441 Lumbago with sciatica, right side: Secondary | ICD-10-CM

## 2018-12-06 NOTE — Progress Notes (Signed)
ORTHOPAEDIC SPINE SURGERY     Visit Type: Follow up Visit    Requesting Provider: Adams-Renteria, Adonis Huguenin    Reason for Visit: Low Back Pain (Patient here to dicuss further treatment.  She did have a surgical procedure in [place] on 07/28/18.)        History Of Present Illness: 38 year old female returns to spine clinic for follow up evaluation of complaints of low back pain. She is known to clinic having previously presented for initial evaluation of mobile spondylolisthesis last fall for which we had recommended lumbar fusion. She ultimately decided on a separate instrumented lumbar procedure being offered in [place], and returns for follow up. She states that she had slight improvement of her symptoms for about a week after surgery, but has since had severe and unrelenting pain. She is unable to stand upright and has pain with any prolonged walking or standing beyond a couple minutes at a time. She has pain at night even when laying flat. She reports low back pain as well as radiating lower extremity pain with paresthesias and feelings of subjective weakness.  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 She states that over the past six months she is unable to stand or walk for any prolonged period of time and has been unable to return to regular activities or working out or volleyball. She denies any focal motor deficits or weakness. Pain is worse with activity and relieved with rest.  Patient rates her pain as 5, largely improved after recent medrol dose pack she had been provided by outside provider.  She has had extensive course of conservative management including time, activity modification, pain medications, PT and injections in addition to chiropractic, massage and acupuncture, in addition to surgery as noted above in [place], without any sustained relief, and with progression. She also has  complaints of increasing neck pain with Patient reports no changes in bowel and bladder function.  She is here today to discuss options.    Allergies:   Allergies   Allergen Reactions   . Sulfa Drugs Nausea Only and Anxiety   . Tramadol Nausea and Vomiting       Medications:   Current Outpatient Medications   Medication Sig   . Amphetamine-Dextroamphetamine (ADDERALL PO) as needed.   . Calcium Carb-Cholecalciferol (CALCIUM 1000 + D PO)    . clindamycin (CLEOCIN T) 1 % solution Apply 1 Application topically 2 times daily. Use a small amount as directed   . cyclobenzaprine (FLEXERIL) 10 MG tablet 1/2 to 1 tab HS PRN pain/muscle spasm   . drospirenone-ethinyl estradiol (YAZ) 3-0.02 MG tablet Take 1 tablet by mouth daily.   . famotidine (PEPCID) 20 MG tablet Take 1 tablet (20 mg) by mouth 2 times daily.   Marland Kitchen gabapentin (NEURONTIN) 100 MG capsule Start 1 capsule HS slowly increase to 2 and then 3 capsules HS as tolerated   . ondansetron (ZOFRAN ODT) 8 MG disintegrating tablet Take 1 tablet (8 mg) by mouth every 8 hours as needed for Nausea/Vomiting.   Marland Kitchen oxyCODONE-acetaminophen (PERCOCET) 5-325 MG tablet Take 1 tablet by mouth every 6 hours as needed for Severe Pain (Pain Score 7-10).   . TURMERIC PO    . zolpidem (AMBIEN) 5 MG tablet Take 1 tablet (5 mg) by mouth nightly as needed for Insomnia.     No current facility-administered medications for this visit.  Past Medical History:  has a past medical history of ADD (attention deficit disorder) and Clavicular fracture.    Past Surgical History:  has a past surgical history that includes ------------other------------- (38 yo); Humerus fracture surgery (Left, 1993); and Back surgery (07/28/2018).    Social History:  reports that she has never smoked. She has never used smokeless tobacco. She reports current alcohol use. She reports current drug use. Drug: Marijuana.    Family History: family history includes Breast Cancer in her paternal aunt  and paternal grandmother; Breast Cancer (age of onset: 19) in her mother; Diabetes in her paternal uncle.    Review of Systems:  CONSTITUTIONAL: No unexpected weight loss, fevers, chills, or anorexia. SKIN: Noncontributory. EARS, NOSE AND THROAT: Noncontributory. EYES: Noncontributory. LUNGS: Noncontributory. CARDIAC: Noncontributory. GASTROINTESTINAL: Noncontributory. GENITOURINARY: Symptoms as noted above in HPI. NEUROLOGIC: Symptoms as noted above in HPI. MUSCULOSKELETAL: Symptoms as noted above in HPI.    Physical Examination:  BP 126/82 (BP Location: Left arm, BP Patient Position: Sitting, BP cuff size: Regular)   Pulse 97   Temp 98 F (36.7 C) (Oral)   Ht 5\' 10"  (1.778 m)   Wt 79.8 kg (176 lb)   BMI 25.25 kg/m   CONSTITUTIONAL: Well appearing and well groomed. PSYCHOLOGICAL: Alert and oriented and appropriate to situation. INTEGUMENT: Intact. VASCULAR: Radial and pedal pulses are intact and symmetric. EXTREMITIES: Bilateral upper and lower extremities have good range of motion and no significant deformities. GAIT: Brisk with good coordination.     CERVICAL SPINE: Nontender to palpation. Spurling's test negative. Sensation intact of the upper extremities.    RANGE OF MOTION (in degrees)       RT LT  Lateral motion   80  80  Lateral bend   45  45  Flexion 40 degrees. Extension 35 degrees. Motion in all planes is nonpainful.    MOTOR    RT LT  Trapezius   5/5  5/5  Deltoid    5/5  5/5  Bicep    5/5  5/5  Tricep    5/5  5/5  Wrist flexor   5/5  5/5  Wrist extensor  5/5  5/5  Intrinsics   5/5  5/5  Grip    5/5  5/5    DEEP TENDON REFLEXES       RT  LT  Bicep    2+  2+  Brachioradialis  2+  2+  Tricep    2+  2+    THORACIC SPINE  Nontender to percussion. Alignment - normal, no paravertebral muscle fullness. Sensation intact. Beevor's negative. Abdominal reflexes are absent and symmetric.     LUMBAR SPINE  Nontender to percussion. Heel to toe walk without deficit. Sensation intact of the lower extremities.  Trendelenburg sign negative. Skin intact.    RANGE OF MOTION        RT  LT  lateral bend, normal at 25 degrees 25  25  rotation, normal at 30 degrees 30  30  Flexion - normal at 40 degrees. Extension - normal at 10 degrees.    MOTOR STRENGTH       RT  LT  Iliopsoas   5/5  5/5  Quadricep   5/5  5/5  Anterior tibialis   5-/5  5-/5  Extensor hallucis longus 5-/5  5-/5  Gastrocsoleus   5/5  5/5    DEEP TENDON REFLEXES       RT  LT  Patellar   2+  2+  Achilles   2+  2+    PATHOLOGIC REFLEXES      RT LT  Clonus    Neg Neg  Babinski   Neg Neg  Hoffmans   Neg Neg    STRAIGHT LEG RAISING       RT  LT  Sitting @ 90 degrees   Neg Neg    IMAGING STUDIES:   Plain radiographs cervical spins shows:  IMPRESSION:    Minimal to mild degenerative disc disease at C6-C7. No abnormal motion with flexion and extension.    Plain radiographs lumbar spine shows:  FINDINGS:  Lumbar spine 2 images    There has been placement of pedicle screws at L4/L5 with a mobile posterior interspinous bridge without any hardware complications. There has been broad posterior decompression with spinous process resection facetectomy. Alignment is unchanged with 5 mm anterolisthesis. The upper lumbar spine appears normal    CT lumbar spine shows retained instrumentation with mobile prosthesis and fixation in tact, with spondylolisthesis. There has been interval complete laminectomy of L4    OTHER MEDICAL RECORDS/IMAGING STUDIES REVIEWED: None    IMPRESSION:   Degenerative spondylolisthesis, lumbar stenosis with radiculopathy s/p lumbar surgery; neck pain    PLAN/DISCUSSION: Reviewed clinical history as well as exam results and imaging findings with patient in detail. Imaging again shows spondylolisthesis L4-5 with interval placement of screw fixation with mobile posterior bridge.  She has had progression of severe low back pain and radiating leg pain with paresthesias and numbness to the point that she is now unable to stand or walk for any significant length  of time. She has severe limitation of her activities to the point that any movement causes pain. We discussed options at length.  She is set to start PT.  We provided rx today for pain medications. We have explained that at this point, only options from surgical standpoint would be for ROI, revision fusion L4-5. She is extremely debilitated and wishes to proceed. We have discussed the planned procedure, including r/b/a in detail. I have described the surgery / procedure in detail.  All the patient's questions were answered. Kilah Drahos understood the possible complications of surgery /  procedure which include but are not limited to: ongoing pain, numbness in the extremities, weakness, nerve injury, dural tear, infection, paralysis, spinal cord injury, need for further surgery, failure of fusion, possible need for reoperation if the fusion fails, instrumentation failure to include the need for repositioning of the implant, breakage, prolonged recuperation, bleeding, need for blood transfusions which includes the risk of a blood reaction, infection or disease, medical complications including pneumonia, prolonged intubation requiring respiratory support, tracheostomy, heart attack, stroke, deep vein thrombosis, pulmonary embolism, blood vessel injury, anesthesia complications and even death.  There is a risk of unsuccessful results from either foreseen or unforeseen causes. Having understood the risks and benefits the patient asked [place] to proceed with surgery / procedure. With regard to her neck pain and upper extremity paresthesias, imaging shows evidence of cervical ddd. Given worsening radicular symptoms and feelings of weakness, plan for MRI cervical spine for further evaluation. She is aware of the current situation with COVID and will try to hold off as long as she is able, but on account of her progressive disability and weakness, she may require more urgent intervention.  We spent 45 minutes with this patient.  More than 50 percent of this time was spent discussing the diagnosis and the treatment plan. All questions were answered and [name]  understood and was satisfied with this plan.     FOLLOWUP: Return to clinic for post op check. The patient is encouraged to call us with any questions or problems in the interim. Our contact numbers were given to the patient.

## 2018-12-07 ENCOUNTER — Encounter (INDEPENDENT_AMBULATORY_CARE_PROVIDER_SITE_OTHER): Payer: Self-pay | Admitting: Orthopaedic Surgery of the Spine

## 2018-12-07 ENCOUNTER — Ambulatory Visit (INDEPENDENT_AMBULATORY_CARE_PROVIDER_SITE_OTHER): Payer: Medicaid Other | Admitting: Orthopaedic Surgery of the Spine

## 2018-12-07 VITALS — BP 122/86 | HR 100 | Temp 97.8°F | Ht 70.0 in | Wt 176.0 lb

## 2018-12-07 DIAGNOSIS — G8929 Other chronic pain: Secondary | ICD-10-CM

## 2018-12-07 DIAGNOSIS — M5441 Lumbago with sciatica, right side: Secondary | ICD-10-CM

## 2018-12-07 DIAGNOSIS — M549 Dorsalgia, unspecified: Secondary | ICD-10-CM

## 2018-12-07 DIAGNOSIS — Z981 Arthrodesis status: Secondary | ICD-10-CM

## 2018-12-07 DIAGNOSIS — M5442 Lumbago with sciatica, left side: Secondary | ICD-10-CM

## 2018-12-07 DIAGNOSIS — M5416 Radiculopathy, lumbar region: Secondary | ICD-10-CM

## 2018-12-07 DIAGNOSIS — M48061 Spinal stenosis, lumbar region without neurogenic claudication: Secondary | ICD-10-CM

## 2018-12-07 MED ORDER — CELECOXIB 200 MG OR CAPS
200.0000 mg | ORAL_CAPSULE | Freq: Two times a day (BID) | ORAL | 1 refills | Status: DC
Start: 2018-12-07 — End: 2018-12-26

## 2018-12-07 MED ORDER — DIAZEPAM 5 MG OR TABS
5.0000 mg | ORAL_TABLET | Freq: Four times a day (QID) | ORAL | 0 refills | Status: DC | PRN
Start: 2018-12-07 — End: 2019-12-07

## 2018-12-07 NOTE — Progress Notes (Signed)
ORTHOPAEDIC SPINE SURGERY     Visit Type: Follow up Visit    Requesting Provider: Zlomislic, Vinko    Reason for Visit: Follow Up (Lower back pain)        History Of Present Illness: 38 year old female returns to spine clinic for follow up evaluation of complaints of low back pain. She is known to clinic having previously presented for initial evaluation of mobile spondylolisthesis last fall for which we had recommended lumbar fusion. She ultimately decided on a separate instrumented lumbar procedure being offered in [place], and returns for follow up. She states that she had slight improvement of her symptoms for about a week after surgery, but has since had severe and unrelenting pain. She is unable to stand upright and has pain with any prolonged walking or standing beyond a couple minutes at a time. She has pain at night even when laying flat. She reports low back pain as well as radiating lower extremity pain with paresthesias and feelings of subjective weakness.  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 She states that over the past six months she is unable to stand or walk for any prolonged period of time and has been unable to return to regular activities or working out or volleyball. She denies any focal motor deficits or weakness. Pain is worse with activity and relieved with rest.  Patient rates her pain as 8, largely improved after recent medrol dose pack she had been provided by outside provider.  She has had extensive course of conservative management including time, activity modification, pain medications, PT and injections in addition to chiropractic, massage and acupuncture, in addition to surgery as noted above in [place], without any sustained relief, and with progression. She also has complaints of increasing neck pain with Patient reports no changes in bowel and bladder function.  She  is here today to discuss details of her planned surgery.    Allergies:   Allergies   Allergen Reactions   . Sulfa Drugs Nausea Only and Anxiety   . Tramadol Nausea and Vomiting       Medications:   Current Outpatient Medications   Medication Sig   . Amphetamine-Dextroamphetamine (ADDERALL PO) as needed.   . Calcium Carb-Cholecalciferol (CALCIUM 1000 + D PO)    . celecoxib (CELEBREX) 200 MG capsule Take 1 capsule (200 mg) by mouth 2 times daily.   . clindamycin (CLEOCIN T) 1 % solution Apply 1 Application topically 2 times daily. Use a small amount as directed   . cyclobenzaprine (FLEXERIL) 10 MG tablet 1/2 to 1 tab HS PRN pain/muscle spasm   . drospirenone-ethinyl estradiol (YAZ) 3-0.02 MG tablet Take 1 tablet by mouth daily.   . famotidine (PEPCID) 20 MG tablet Take 1 tablet (20 mg) by mouth 2 times daily.   Marland Kitchen gabapentin (NEURONTIN) 100 MG capsule Start 1 capsule HS slowly increase to 2 and then 3 capsules HS as tolerated   . ondansetron (ZOFRAN ODT) 8 MG disintegrating tablet Take 1 tablet (8 mg) by mouth every 8 hours as needed for Nausea/Vomiting.   Marland Kitchen oxyCODONE-acetaminophen (PERCOCET) 5-325 MG tablet Take 1 tablet by mouth every 6 hours as needed for Severe Pain (Pain Score 7-10).   . TURMERIC PO    . zolpidem (AMBIEN) 5 MG tablet Take 1 tablet (5 mg) by mouth nightly as needed for Insomnia.     No current facility-administered medications for this  visit.                                 Past Medical History:  has a past medical history of ADD (attention deficit disorder) and Clavicular fracture.    Past Surgical History:  has a past surgical history that includes ------------other------------- (38 yo); Humerus fracture surgery (Left, 1993); and Back surgery (07/28/2018).    Social History:  reports that she has never smoked. She has never used smokeless tobacco. She reports current alcohol use. She reports current drug use. Drug: Marijuana.    Family History: family history includes Breast Cancer in her  paternal aunt and paternal grandmother; Breast Cancer (age of onset: 17) in her mother; Diabetes in her paternal uncle.    Review of Systems:  CONSTITUTIONAL: No unexpected weight loss, fevers, chills, or anorexia. SKIN: Noncontributory. EARS, NOSE AND THROAT: Noncontributory. EYES: Noncontributory. LUNGS: Noncontributory. CARDIAC: Noncontributory. GASTROINTESTINAL: Noncontributory. GENITOURINARY: Symptoms as noted above in HPI. NEUROLOGIC: Symptoms as noted above in HPI. MUSCULOSKELETAL: Symptoms as noted above in HPI.    Physical Examination:  BP 122/86 (BP Location: Left arm, BP Patient Position: Sitting, BP cuff size: Regular)   Pulse 100   Temp 97.8 F (36.6 C) (Oral)   Ht  (1.778 m)   Wt 79.8 kg (176 lb)   BMI 25.25 kg/m   CONSTITUTIONAL: Well appearing and well groomed. PSYCHOLOGICAL: Alert and oriented and appropriate to situation. INTEGUMENT: Intact. VASCULAR: Radial and pedal pulses are intact and symmetric. EXTREMITIES: Bilateral upper and lower extremities have good range of motion and no significant deformities. GAIT: Brisk with good coordination.     CERVICAL SPINE: Nontender to palpation. Spurling's test negative. Sensation intact of the upper extremities.    RANGE OF MOTION (in degrees)       RT LT  Lateral motion   80  80  Lateral bend   45  45  Flexion 40 degrees. Extension 35 degrees. Motion in all planes is nonpainful.    MOTOR    RT LT  Trapezius   5/5  5/5  Deltoid    5/5  5/5  Bicep    5/5  5/5  Tricep    5/5  5/5  Wrist flexor   5/5  5/5  Wrist extensor   5/5  5/5  Intrinsics   5/5  5/5  Grip    5/5  5/5    DEEP TENDON REFLEXES       RT  LT  Bicep    2+  2+  Brachioradialis  2+  2+  Tricep    2+  2+    THORACIC SPINE  Nontender to percussion. Alignment - normal, no paravertebral muscle fullness. Sensation intact. Beevor's negative. Abdominal reflexes are absent and symmetric.     LUMBAR SPINE  Nontender to percussion. Heel to toe walk without deficit. Sensation intact of the  lower extremities. Trendelenburg sign negative. Skin intact.    RANGE OF MOTION        RT  LT  lateral bend, normal at 25 degrees 25  25  rotation, normal at 30 degrees 30  30  Flexion - normal at 40 degrees. Extension - normal at 10 degrees.    MOTOR STRENGTH       RT  LT  Iliopsoas   5/5  5/5  Quadricep   5/5  5/5  Anterior tibialis   5-/5  5-/5  Extensor hallucis  longus 5-/5  5-/5  Gastrocsoleus   5/5  5/5    DEEP TENDON REFLEXES       RT  LT  Patellar   2+  2+  Achilles   2+  2+    PATHOLOGIC REFLEXES      RT LT  Clonus    Neg Neg  Babinski   Neg Neg  Hoffmans   Neg Neg    STRAIGHT LEG RAISING       RT  LT  Sitting @ 90 degrees   Neg Neg    IMAGING STUDIES:   Plain radiographs cervical spins shows:  IMPRESSION:    Minimal to mild degenerative disc disease at C6-C7. No abnormal motion with flexion and extension.    Plain radiographs lumbar spine shows:  FINDINGS:  Lumbar spine 2 images    There has been placement of pedicle screws at L4/L5 with a mobile posterior interspinous bridge without any hardware complications. There has been broad posterior decompression with spinous process resection facetectomy. Alignment is unchanged with 5 mm anterolisthesis. The upper lumbar spine appears normal    CT lumbar spine shows retained instrumentation with mobile prosthesis and fixation in tact, with spondylolisthesis. There has been interval complete laminectomy of L4    OTHER MEDICAL RECORDS/IMAGING STUDIES REVIEWED: None    IMPRESSION:   Degenerative spondylolisthesis, lumbar stenosis with radiculopathy s/p lumbar surgery; neck pain    PLAN/DISCUSSION: All of her questions regarding the surgical plan were answered, and discussed in detail. Rx for Celebrex was sent to her pharmacy for pain control, and a new order for PT was placed in the interim while she awaits surgical scheduling. Please see documentation from clinic visit 11/23/2018 for the surgical plan and consent documentation. We spent 45 minutes with this  patient. More than 50 percent of this time was spent discussing the diagnosis and the treatment plan. All questions were answered and Shondrea Trim understood and was satisfied with this plan.     FOLLOWUP: Return to clinic for post op check. The patient is encouraged to call us with any questions or problems in the interim. Our contact numbers were given to the patient.

## 2018-12-12 ENCOUNTER — Encounter (INDEPENDENT_AMBULATORY_CARE_PROVIDER_SITE_OTHER): Payer: Self-pay

## 2018-12-14 ENCOUNTER — Telehealth (INDEPENDENT_AMBULATORY_CARE_PROVIDER_SITE_OTHER): Payer: Medicaid Other | Admitting: Orthopaedic Surgery of the Spine

## 2018-12-14 ENCOUNTER — Encounter (INDEPENDENT_AMBULATORY_CARE_PROVIDER_SITE_OTHER): Payer: Self-pay | Admitting: Orthopaedic Surgery of the Spine

## 2018-12-14 DIAGNOSIS — M48061 Spinal stenosis, lumbar region without neurogenic claudication: Secondary | ICD-10-CM

## 2018-12-14 DIAGNOSIS — M48062 Spinal stenosis, lumbar region with neurogenic claudication: Secondary | ICD-10-CM

## 2018-12-14 DIAGNOSIS — M5416 Radiculopathy, lumbar region: Secondary | ICD-10-CM

## 2018-12-14 DIAGNOSIS — Z981 Arthrodesis status: Secondary | ICD-10-CM

## 2018-12-14 DIAGNOSIS — M4316 Spondylolisthesis, lumbar region: Secondary | ICD-10-CM

## 2018-12-15 ENCOUNTER — Encounter (INDEPENDENT_AMBULATORY_CARE_PROVIDER_SITE_OTHER): Payer: Self-pay

## 2018-12-15 NOTE — Progress Notes (Signed)
ORTHOPAEDIC SPINE SURGERY     Visit Type: Follow up Visit    Requesting Provider: Self, Referred    Reason for Visit: No chief complaint on file.        History Of Present Illness: 38 year old female returns to spine clinic for follow up evaluation of complaints of low back pain. She is seen today by video telehealth due to COVID pandemic. She presents asking additional questions regarding surgery. She states that she has had progression of severe pain to the point that she has weakness and her legs are giving way. She states that she has had episodes of bladder incontinence. To review briefly, she is known to clinic having previously presented for initial evaluation of mobile spondylolisthesis last fall for which we had recommended lumbar fusion. She ultimately decided on a separate instrumented lumbar procedure being offered in [place], and returns for follow up. She states that she had slight improvement of her symptoms for about a week after surgery, but has since had severe and unrelenting pain. She is unable to stand upright and has pain with any prolonged walking or standing beyond a couple minutes at a time. She has pain at night even when laying flat. She reports low back pain as well as radiating lower extremity pain with paresthesias and feelings of subjective weakness.  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 She states that over the past six months she is unable to stand or walk for any prolonged period of time and has been unable to return to regular activities or working out or volleyball. She denies any focal motor deficits or weakness. Pain is worse with activity and relieved with rest.  Patient rates her pain as  , largely improved after recent medrol dose pack she had been provided by outside provider.  She has had extensive course of conservative management including time, activity  modification, pain medications, PT and injections in addition to chiropractic, massage and acupuncture, in addition to surgery as noted above in [place], without any sustained relief, and with progression. She also has complaints of increasing neck pain with Patient reports no changes in bowel and bladder function.  She is here today to discuss details of her planned surgery.    Allergies:   Allergies   Allergen Reactions   . Sulfa Drugs Nausea Only and Anxiety   . Tramadol Nausea and Vomiting       Medications:   Current Outpatient Medications   Medication Sig   . Amphetamine-Dextroamphetamine (ADDERALL PO) as needed.   . Calcium Carb-Cholecalciferol (CALCIUM 1000 + D PO)    . celecoxib (CELEBREX) 200 MG capsule Take 1 capsule (200 mg) by mouth 2 times daily.   . clindamycin (CLEOCIN T) 1 % solution Apply 1 Application topically 2 times daily. Use a small amount as directed   . cyclobenzaprine (FLEXERIL) 10 MG tablet 1/2 to 1 tab HS PRN pain/muscle spasm   . diazepam (VALIUM) 5 MG tablet Take 1 tablet (5 mg) by mouth every 6 hours as needed for Insomnia or Muscle Spasms.   . drospirenone-ethinyl estradiol (YAZ) 3-0.02 MG tablet Take 1 tablet by mouth daily.   . famotidine (PEPCID) 20 MG tablet Take 1 tablet (20 mg) by mouth 2 times daily.   Marland Kitchen gabapentin (NEURONTIN) 100 MG capsule Start 1 capsule HS slowly increase to 2 and then 3 capsules HS as tolerated   . ondansetron (  ZOFRAN ODT) 8 MG disintegrating tablet Take 1 tablet (8 mg) by mouth every 8 hours as needed for Nausea/Vomiting.   Marland Kitchen. oxyCODONE-acetaminophen (PERCOCET) 5-325 MG tablet Take 1 tablet by mouth every 6 hours as needed for Severe Pain (Pain Score 7-10).   . TURMERIC PO    . zolpidem (AMBIEN) 5 MG tablet Take 1 tablet (5 mg) by mouth nightly as needed for Insomnia.     No current facility-administered medications for this visit.                                 Past Medical History:  has a past medical history of ADD (attention deficit disorder) and  Clavicular fracture.    Past Surgical History:  has a past surgical history that includes ------------other------------- 82(38 yo); Humerus fracture surgery (Left, 1993); and Back surgery (07/28/2018).    Social History:  reports that she has never smoked. She has never used smokeless tobacco. She reports current alcohol use. She reports current drug use. Drug: Marijuana.    Family History: family history includes Breast Cancer in her paternal aunt and paternal grandmother; Breast Cancer (age of onset: 6360) in her mother; Diabetes in her paternal uncle.    Review of Systems:  CONSTITUTIONAL: No unexpected weight loss, fevers, chills, or anorexia. SKIN: Noncontributory. EARS, NOSE AND THROAT: Noncontributory. EYES: Noncontributory. LUNGS: Noncontributory. CARDIAC: Noncontributory. GASTROINTESTINAL: Noncontributory. GENITOURINARY: Symptoms as noted above in HPI. NEUROLOGIC: Symptoms as noted above in HPI. MUSCULOSKELETAL: Symptoms as noted above in HPI.    Physical Examination:  There were no vitals taken for this visit.  CONSTITUTIONAL: Well appearing and well groomed. PSYCHOLOGICAL: Alert and oriented and appropriate to situation. INTEGUMENT: Intact. VASCULAR: Radial and pedal pulses are intact and symmetric. EXTREMITIES: Bilateral upper and lower extremities have good range of motion and no significant deformities. GAIT: Brisk with good coordination.     CERVICAL SPINE: Nontender to palpation. Spurling's test negative. Sensation intact of the upper extremities.    RANGE OF MOTION (in degrees)       RT LT  Lateral motion   80  80  Lateral bend   45  45  Flexion 40 degrees. Extension 35 degrees. Motion in all planes is nonpainful.    MOTOR    RT LT  Trapezius   5/5  5/5  Deltoid    5/5  5/5  Bicep    5/5  5/5  Tricep    5/5  5/5  Wrist flexor   5/5  5/5  Wrist extensor   5/5  5/5  Intrinsics   5/5  5/5  Grip    5/5  5/5    DEEP TENDON REFLEXES       RT  LT  Bicep    2+  2+  Brachioradialis  2+  2+  Tricep    2+   2+    THORACIC SPINE  Nontender to percussion. Alignment - normal, no paravertebral muscle fullness. Sensation intact. Beevor's negative. Abdominal reflexes are absent and symmetric.     LUMBAR SPINE  Nontender to percussion. Heel to toe walk without deficit. Sensation intact of the lower extremities. Trendelenburg sign negative. Skin intact.    RANGE OF MOTION        RT  LT  lateral bend, normal at 25 degrees 25  25  rotation, normal at 30 degrees 30  30  Flexion - normal at 40 degrees. Extension - normal  at 10 degrees.    MOTOR STRENGTH       RT  LT  Iliopsoas   5/5  5/5  Quadricep   5/5  5/5  Anterior tibialis   5-/5  5-/5  Extensor hallucis longus 5-/5  5-/5  Gastrocsoleus   5/5  5/5    DEEP TENDON REFLEXES       RT  LT  Patellar   2+  2+  Achilles   2+  2+    PATHOLOGIC REFLEXES      RT LT  Clonus    Neg Neg  Babinski   Neg Neg  Hoffmans   Neg Neg    STRAIGHT LEG RAISING       RT  LT  Sitting @ 90 degrees   Neg Neg    IMAGING STUDIES:   Plain radiographs cervical spins shows:  IMPRESSION:    Minimal to mild degenerative disc disease at C6-C7. No abnormal motion with flexion and extension.    Plain radiographs lumbar spine shows:  FINDINGS:  Lumbar spine 2 images    There has been placement of pedicle screws at L4/L5 with a mobile posterior interspinous bridge without any hardware complications. There has been broad posterior decompression with spinous process resection facetectomy. Alignment is unchanged with 5 mm anterolisthesis. The upper lumbar spine appears normal    CT lumbar spine shows retained instrumentation with mobile prosthesis and fixation in tact, with spondylolisthesis. There has been interval complete laminectomy of L4    OTHER MEDICAL RECORDS/IMAGING STUDIES REVIEWED: None    IMPRESSION:   Degenerative spondylolisthesis, lumbar stenosis with radiculopathy s/p lumbar surgery; neck pain    PLAN/DISCUSSION: All of her questions regarding the surgical plan were answered, and discussed in detail.  She is pending surgery scheduling, and given the worsening progression of her symptoms with regard to weakness and significant immobility, we discussed trying to expedite surgery if possible. With regard to her cervical complaints, her symptoms appear stable and she has not completed formal PT so at this point will defer MRI as we have explained that it was denied by her insurance and she is understanding of this. We spent 45 minutes with this patient. More than 50 percent of this time was spent discussing the diagnosis and the treatment plan. All questions were answered and Makayli Linkenhoker understood and was satisfied with this plan.     FOLLOWUP: Return to clinic for post op check. The patient is encouraged to call us with any questions or problems in the interim. Our contact numbers were given to the patient.

## 2018-12-15 NOTE — Progress Notes (Signed)
Attending Note:    Subjective:  I reviewed the history.  Patient interviewed and examined.  History of present illness (HPI):  Follow Up (Lower back pain)     Review of Systems (ROS): As per the fellow's note.  Past Medical, Family, Social History:  As per the fellow's  note.    Objective:   I have examined the patient and I concur with the fellow's exam.  Assessment and plan reviewed with the fellow. I agree with the fellow's plan as documented.  See the fellow's note for further details.

## 2018-12-16 ENCOUNTER — Telehealth (INDEPENDENT_AMBULATORY_CARE_PROVIDER_SITE_OTHER): Payer: Self-pay | Admitting: Hospitalist

## 2018-12-16 NOTE — Telephone Encounter (Signed)
First attempt: Pending conversion to video or tele visit, unable to reach patient, left detailed voice message, will attempt to call patient at a later time

## 2018-12-20 NOTE — Telephone Encounter (Signed)
Noted  

## 2018-12-20 NOTE — Telephone Encounter (Signed)
Second attempt: unable to reach patient, left detailed voice message, will attempt to call patient at a later time     Call Center: if pt calls back please assist in converting upcoming apt to Tele or Video visit.  Thank you

## 2018-12-20 NOTE — Telephone Encounter (Signed)
Who is calling: Incoming call from patient  Insurance Coverage Verified: Active- in network  Reason for this call: Patient is calling back to confirm appointment for 12/26/18 agent switched to [facility] video.     Action required by office: No action needed. Routing to clinic as FYI    Duplicate encounter? No previous documentation found on this issue.     Best way to contact: [phone]    Inquiry has been read verbatim to this caller. Verbalizes satisfaction and confirms the above is accurate: yes      Has been advised this message will be transmitted to office and can expect a response within the next 24-72 hours.

## 2018-12-22 ENCOUNTER — Encounter (HOSPITAL_COMMUNITY): Payer: Self-pay | Admitting: Orthopaedic Surgery of the Spine

## 2018-12-22 DIAGNOSIS — M48061 Spinal stenosis, lumbar region without neurogenic claudication: Secondary | ICD-10-CM

## 2018-12-23 ENCOUNTER — Encounter (HOSPITAL_COMMUNITY): Payer: Self-pay | Admitting: Orthopaedic Surgery of the Spine

## 2018-12-23 MED ORDER — ZOLPIDEM TARTRATE 5 MG OR TABS
5.0000 mg | ORAL_TABLET | Freq: Every evening | ORAL | 0 refills | Status: DC | PRN
Start: 2018-12-23 — End: 2019-02-16

## 2018-12-23 NOTE — Addendum Note (Signed)
Addended by: Olena Heckle on: 12/23/2018 07:27 PM     Modules accepted: Orders

## 2018-12-23 NOTE — Telephone Encounter (Signed)
Mychart message sent. Will set up refill for rx

## 2018-12-23 NOTE — Telephone Encounter (Signed)
From: [name]  To: [name], MD  Sent: 12/22/2018 8:27 PM PDT  Subject: 20-Other    Would it be possible to refill my Rx for Ambien? I'll likely be out of it in the next week or so and it seems to be the best thing to get me to sleep when I'm in pain which is often the case at the end of the day. If possible, could I also get the 10mg  version or enough to take a bit more during the month? Sometimes when one has not been enough to get me to sleep or if I wake up a few hours later in pain I will take another one to get some more sleep.    Thanks,  [name]

## 2018-12-24 ENCOUNTER — Encounter (HOSPITAL_BASED_OUTPATIENT_CLINIC_OR_DEPARTMENT_OTHER): Payer: Self-pay | Admitting: Orthopedics

## 2018-12-26 ENCOUNTER — Telehealth (INDEPENDENT_AMBULATORY_CARE_PROVIDER_SITE_OTHER): Payer: Medicaid Other | Admitting: Hospitalist

## 2018-12-26 ENCOUNTER — Telehealth (INDEPENDENT_AMBULATORY_CARE_PROVIDER_SITE_OTHER): Payer: Self-pay | Admitting: Hospitalist

## 2018-12-26 ENCOUNTER — Encounter

## 2018-12-26 DIAGNOSIS — M4800 Spinal stenosis, site unspecified: Secondary | ICD-10-CM

## 2018-12-26 DIAGNOSIS — K219 Gastro-esophageal reflux disease without esophagitis: Secondary | ICD-10-CM

## 2018-12-26 MED ORDER — CELECOXIB 200 MG OR CAPS
200.00 mg | ORAL_CAPSULE | Freq: Two times a day (BID) | ORAL | 3 refills | Status: DC | PRN
Start: 2018-12-26 — End: 2019-01-07

## 2018-12-26 NOTE — Telephone Encounter (Signed)
My chart about appt today

## 2018-12-26 NOTE — Progress Notes (Signed)
ATTENDING NOTE    Subjective:   I discussed the case with the resident at the time of the clinical session.  I reviewed the entire history.  History of present illness (HPI):  Back surgery pending for      Objective:   LMP 12/12/2018 (Approximate)      I saw and examined the patient and reviewed the resident's examination findings. The test results were reviewed with the resident.       Medical Plan of Care:   Assessment and Plan reviewed, discussed and finalized with the resident.   Spinal stenosis pending surgery.  Cont pain meds   I agree with the resident's plan as documented.      ICD-10-CM ICD-9-CM    1. Spinal stenosis, unspecified spinal region M48.00 724.00 celecoxib (CELEBREX) 200 MG capsule   2. Gastroesophageal reflux disease without esophagitis K21.9 530.81       Co-morbid medical problem was taken into account as part of the thought process of making a medical decision regarding the active/acute issue(s).    See the resident physician's note for further details.

## 2018-12-26 NOTE — Progress Notes (Signed)
[facility] Telemedicine MyChart Video Visit      ---------------------(data below generated by Meredeth Ide, MD)--------------------    Patient Verification & Telemedicine Consent:    I am proceeding with this evaluation at the direct request of the patient.  I have verified this is the correct patient and have obtained verbal consent and written consent from the patient/ surrogate to perform this voluntary telemedicine evaluation (including obtaining history, performing examination and reviewing data provided by the patient).   The patient/ surrogate has the right to refuse this evaluation.  I have explained risks (including potential loss of confidentiality), benefits, alternatives, and the potential need for subsequent face to face care. Patient/ surrogate understands that there is a risk of medical inaccuracies given that our recommendations will be made based on reported data (and we must therefore assume this information is accurate).  Knowing that there is a risk that this information is not reported accurately, and that the telemedicine video, audio, or data feed may be incomplete, the patient agrees to proceed with evaluation and holds [place] harmless knowing these risks. In this evaluation, we will be providing recommendations only. The patient/ surrogate has been notified that other healthcare professionals (including students, residents and Engineer, maintenance) may be involved in this audio-video evaluation.   All laws concerning confidentiality and patient access to medical records and copies of medical records apply to telemedicine.  The patient/ surrogate has received the [place] Notice of Privacy Practices.  I have reviewed this above verification and consent paragraph with the patient/ surrogate.  If the patient is not capacitated to understand the above, and no surrogate is available, since this is not an emergency evaluation, the visit will be rescheduled until such time that the patient can consent, or the  surrogate is available to consent.    Demographics:   Medical Record #: [id number]   Date: December 26, 2018   Patient Name: Haley Barrett   DOB: 15-Jan-1981  Age: 38 year old  Sex: female  Location: Home address on file    Evaluator(s):   Healy Furtak was evaluated by me today.    Clinic Location: [facility]  [place] [facility]  [address]  [place] [address]    Telemedicine Video Visit Telemedicine Appointment:    Haley Barrett is a 38 year old female who is attending a virtual based visit.    Patient consent was acquired.    Patient reports that her back pain has improved 20% with the initiation of gabapentin.  Does seem to make her drowsy though so she has been taking it slowly. Currently at 100 mg nightly.  Will try to increase to 200 mg nightly and then 300 mg nightly.  Other than that no change in her back pain.  Continues to use oxycodone 2-3 x weekly as needed and awaiting surgery with Dr. Christa See.      Review of Systems:  Review of Systems - 12 point ros neg except as per hpi.     Past Medical History:  Patient Active Problem List   Diagnosis   . ADD (attention deficit disorder)   . Birth control   . Finger dislocation, subsequent encounter   . Chronic bilateral low back pain, with sciatica presence unspecified   . Lumbar spondylosis   . Lumbar disc disease with radiculopathy   . Degenerative spondylolisthesis   . Spinal stenosis of lumbar region with radiculopathy   . Back pain, unspecified back location, unspecified back pain laterality,  unspecified chronicity   . Chronic bilateral low back pain with bilateral sciatica   . Spinal stenosis of lumbar region, unspecified whether neurogenic claudication present   . S/P lumbar fusion   . Gastroesophageal reflux disease without esophagitis       Medications were reviewed and updated on the Active Medication List     Psychosocial:  Social History     Tobacco Use   . Smoking status: Never Smoker   . Smokeless tobacco: Never Used      Substance Use Topics   . Alcohol use: Yes     Comment: socially, couple drinks/week    . Drug use: Yes     Types: Marijuana     Comment: edibles        Allergies   Allergen Reactions   . Sulfa Drugs Nausea Only and Anxiety   . Tramadol Nausea and Vomiting       The Family History:  Family History   Problem Relation Name Age of Onset   . Diabetes Paternal Uncle     . Breast Cancer Paternal Aunt     . Breast Cancer Mother  8360   . Breast Cancer Paternal Grandmother     . Colon Cancer Neg Hx     . Hypertension Neg Hx     . Cholesterol/Lipid Disorder Neg Hx         Physical Exam:  GENERAL: healthy, alert, no distress  MENTAL STATUS: Full and Appropriate  HEENT: EOMI and Trachea midline  BEHAVIORAL: Denies significant problems with depression or anxiety.  NEUROLOGICAL: Gait normal. Moves all four extremities spontaneously. Speech normal.    Studies  Na 137 (10/31) CL 100 (10/31) BUN 8 (10/31) GLU   87 (10/31)   K 4.5 (10/31) CO2 22 (10/31) Cr 0.90 (10/31)        WBC 7.6 (11/04) HGB 13.1 (11/04) PLT 329 (11/04)    HCT 40.6 (11/04)        PT 9.9 (11/04) PTT     INR 0.9 (11/04)       ASSESSMENT AND PLAN  Jackelyn HoehnMegan Tung is a 38 year old female who was provided virtual visit with video visit care delivery.   [name] was seen today for recheck and other.    Diagnoses and all orders for this visit:    Spinal stenosis, unspecified spinal region- previously prescribed celebrex as a replacement of naproxen given GERD sxs. Counseled on increasing gabapentin.  Current agreement is no narcotic refills following surgery.  Will get acute narcotic meds for surgery pain from surgical team.    -     celecoxib (CELEBREX) 200 MG capsule; Take 1 capsule (200 mg) by mouth 2 times daily as needed for Mild Pain (Pain Score 1-3) or Moderate Pain (Pain Score 4-6).    Gastroesophageal reflux disease without esophagitis- now resolved off naproxen. Try celebrex instead.      The plan was carefully reviewed verbally with the patient and also affirmed  that the patient understood next steps and follow up plan.    Orders this encounter:  Lab No orders of the defined types were placed in this encounter.    Imaging No orders of the defined types were placed in this encounter.    Procedures No orders of the defined types were placed in this encounter.    Other No orders of the defined types were placed in this encounter.      RETURN TO CLINIC INSTRUCTIONS  Return to clinic PRN  Patient seen by (on  mychart video) and plan discussed with Dr. Jonathon Resides, PGY3, IM

## 2018-12-26 NOTE — Telephone Encounter (Signed)
My chart to pt asking if she can log on earlier.

## 2018-12-27 ENCOUNTER — Telehealth (INDEPENDENT_AMBULATORY_CARE_PROVIDER_SITE_OTHER): Payer: Self-pay

## 2018-12-27 ENCOUNTER — Encounter (INDEPENDENT_AMBULATORY_CARE_PROVIDER_SITE_OTHER): Payer: Self-pay | Admitting: Anesthesiology

## 2018-12-27 DIAGNOSIS — Z01818 Encounter for other preprocedural examination: Principal | ICD-10-CM

## 2018-12-27 NOTE — Telephone Encounter (Signed)
Outbound call to patient on phone # [phone]    Order in Epic:  COVID-19 Coronavirus Detection Assay at [facility]    Please note no answer from patient. I have left a detailed message: I am calling from [facility] regarding your upcoming surgery. Please note you will need to complete COVID testing prior to surgery. I will attempt to contact you again later or please return call and provided # [phone].

## 2018-12-27 NOTE — Telephone Encounter (Signed)
Received message below.    Patient has appt scheduled on : 4/26 @ 11:45am in [facility]    ----- Message -----   From: Holley Raring, RN   Sent: 12/27/2018  7:45 AM PDT   To: [facility] Surgery Scheduler   Subject: Covid Appt                      Covid ordered, needs appt with in 72 hours of surgery (4/26-4/27), surgery 4/28     Sincerely,       Holley Raring, RN, BSN, CNOR   [facility]   [email or web address]   [phone]

## 2018-12-28 ENCOUNTER — Encounter (HOSPITAL_BASED_OUTPATIENT_CLINIC_OR_DEPARTMENT_OTHER): Payer: Self-pay | Admitting: Orthopaedic Surgery of the Spine

## 2018-12-29 ENCOUNTER — Telehealth (INDEPENDENT_AMBULATORY_CARE_PROVIDER_SITE_OTHER): Payer: Self-pay | Admitting: Anesthesiology

## 2018-12-29 NOTE — Telephone Encounter (Signed)
lvm re: confirming ph appt for tomorrow.

## 2018-12-30 ENCOUNTER — Encounter (INDEPENDENT_AMBULATORY_CARE_PROVIDER_SITE_OTHER): Payer: Self-pay | Admitting: General Surgery

## 2018-12-30 ENCOUNTER — Ambulatory Visit (INDEPENDENT_AMBULATORY_CARE_PROVIDER_SITE_OTHER): Payer: Medicaid Other

## 2018-12-30 ENCOUNTER — Telehealth (INDEPENDENT_AMBULATORY_CARE_PROVIDER_SITE_OTHER): Payer: Medicaid Other

## 2018-12-30 ENCOUNTER — Encounter (HOSPITAL_BASED_OUTPATIENT_CLINIC_OR_DEPARTMENT_OTHER): Payer: Self-pay | Admitting: General Surgery

## 2018-12-30 ENCOUNTER — Ambulatory Visit (INDEPENDENT_AMBULATORY_CARE_PROVIDER_SITE_OTHER): Payer: Medicaid Other | Admitting: General Surgery

## 2018-12-30 DIAGNOSIS — Z01818 Encounter for other preprocedural examination: Secondary | ICD-10-CM

## 2018-12-30 MED ORDER — NAPROXEN 500 MG OR TABS
ORAL_TABLET | ORAL | Status: DC
Start: 2018-12-13 — End: 2018-12-30

## 2018-12-30 MED ORDER — AMPHETAMINE-DEXTROAMPHETAMINE 10 MG OR TABS
10.00 mg | ORAL_TABLET | Freq: Three times a day (TID) | ORAL | Status: DC
Start: 2018-12-10 — End: 2019-03-03

## 2018-12-30 MED ORDER — ALPRAZOLAM 0.25 MG OR TABS
ORAL_TABLET | ORAL | Status: AC
Start: 2018-12-10 — End: ?

## 2018-12-30 NOTE — Anesthesia Preprocedure Evaluation (Addendum)
ANESTHESIA PRE-OPERATIVE EVALUATION    Patient Information    Name: Haley Barrett    MRN: [id number]    DOB: [birth year]    Age: 38 year old    Sex: female  Procedure(s):  Stage 1: EXTREME LATERAL INTERBODY FUSION L4-5  Stage 2: ROI, revision laminectomy, revision POSTERIOR SPINAL INSTRUMENTATION AND FUSION L4-5      Pre-op Vitals:   LMP 12/12/2018 (Approximate)         Primary language spoken:  English    ROS/Medical History:      History of Present Illness: Degenerative spondylolisthesis, lumbar stenosis with radiculopathy s/p lumbar surgery; neck pain    General:  negative for General ROS   Cardiovascular:  negative cardio ROS  Walks about house; showers  No cp,no sob  > 36mts    07/2018  Normal sinus rhythm   Prominent early anterior forces c/w normal variant   Anesthesia History:  negative anesthesia history ROS  History of difficult IV access comment:    chronic pain patient ( 1 percocet a day ; flexeril),  Phone anes eval  Needs exam    VERY concerned about pain control - last back surgery severe   Pulmonary:   negative pulmonary ROS     Neuro/Psych:   psychiatric history (anxiety- does well with small dose of xanax),  Neuropathy- gabapentin at HS  Ambien DAily Hematology/Oncology:   hematologic/lymphatic negative      GI/Hepatic:  GERD, well controlled,   Infectious Disease:  negative for infectious disease     Renal:  negative renal ROS   Endocrine/Other:  back pain,     Pregnancy History:  Currently pregnant: no,  Lmp: - ? On bcp   Pediatrics:         Pre Anesthesia Testing (PCC/CPC) notes/comments:    [facility]                      Phone anes eval: Pre op instructions reviewed and emailed               Physical Exam    Airway:    Inter-inciser distance > 4 cm  Prognanth Able    Mallampati: I  Neck ROM: full  TM distance: > 6 cm  Short thick neck: No          Cardiovascular:  - cardiovascular exam normal         Pulmonary:  - pulmonary exam normal                      Neuro/Neck/Skeletal/Skin:  - [place] ANE PHYS EXAM NEGATIVE ROS SKIN SKELETAL NEURO NECK          Dental:  - normal exam    Abdominal:   - normal exam         Additional Clinical Notes:               Last  OSA (STOP BANG) Score:  No data recorded    Last OSA  (STOP) Score for   Has a physician diagnosed you with sleep apnea?: No  Do you use a CPAP at home?: No  Do you snore loudly (loud enough to be heard through a closed door)?: 0  Do you often feel tired, fatigued or sleepy during the day?: 0  Has anyone observed that you stop breathing while you are sleeping?: 0  Have you ever been treated for high blood pressure?:  0  OSA total score (A score of 2 or more is high risk. Offer patient sleep study.): 0        Has a physician diagnosed you with sleep apnea?: No  Do you use a CPAP at home?: No  OSA total score (A score of 2 or more is high risk. Offer patient sleep study.): 0    Past Medical History:   Diagnosis Date   . ADD (attention deficit disorder)    . Clavicular fracture      Past Surgical History:   Procedure Laterality Date   . BACK SURGERY  07/28/2018    In [place]   . HUMERUS FRACTURE SURGERY Left 1993   . ------------OTHER-------------  38 yo    humerus ORIF      Social History     Tobacco Use   . Smoking status: Never Smoker   . Smokeless tobacco: Never Used   Substance Use Topics   . Alcohol use: Yes     Comment: socially, couple drinks/week    . Drug use: Yes     Types: Marijuana     Comment: edibles       Current Outpatient Medications   Medication Sig Dispense Refill   . Amphetamine-Dextroamphetamine (ADDERALL PO) as needed.     . celecoxib (CELEBREX) 200 MG capsule Take 1 capsule (200 mg) by mouth 2 times daily as needed for Mild Pain (Pain Score 1-3) or Moderate Pain (Pain Score 4-6). 60 capsule 3   . clindamycin (CLEOCIN T) 1 % solution Apply 1 Application topically 2 times daily. Use a small amount as directed 1 bottle 2   . cyclobenzaprine (FLEXERIL) 10 MG tablet 1/2 to 1 tab HS PRN  pain/muscle spasm 30 tablet 0   . diazepam (VALIUM) 5 MG tablet Take 1 tablet (5 mg) by mouth every 6 hours as needed for Insomnia or Muscle Spasms. 30 tablet 0   . drospirenone-ethinyl estradiol (YAZ) 3-0.02 MG tablet Take 1 tablet by mouth daily. 84 tablet 3   . gabapentin (NEURONTIN) 100 MG capsule Start 1 capsule HS slowly increase to 2 and then 3 capsules HS as tolerated 90 capsule 0   . ondansetron (ZOFRAN ODT) 8 MG disintegrating tablet Take 1 tablet (8 mg) by mouth every 8 hours as needed for Nausea/Vomiting. 15 tablet 0   . oxyCODONE-acetaminophen (PERCOCET) 5-325 MG tablet Take 1 tablet by mouth every 6 hours as needed for Severe Pain (Pain Score 7-10). 50 tablet 0   . TURMERIC PO      . zolpidem (AMBIEN) 5 MG tablet Take 1 tablet (5 mg) by mouth nightly as needed for Insomnia. 30 tablet 0     No current facility-administered medications for this visit.      Allergies   Allergen Reactions   . Sulfa Drugs Nausea Only and Anxiety   . Tramadol Nausea and Vomiting       Labs and Other Data  Lab Results   Component Value Date    NA 137 07/07/2018    K 4.5 07/07/2018    CL 100 07/07/2018    BICARB 22 07/07/2018    BUN 8 07/07/2018    CREAT 0.90 07/07/2018    GLU 87 07/07/2018    Kinta 9.3 07/07/2018     Lab Results   Component Value Date    AST 29 12/17/2015    ALT 30 12/17/2015    ALK 62 12/17/2015    TP 7.7 12/17/2015    ALB 4.4  12/17/2015    TBILI 0.28 12/17/2015     Lab Results   Component Value Date    WBC 7.6 07/11/2018    RBC 4.24 07/11/2018    HGB 13.1 07/11/2018    HCT 40.6 07/11/2018    MCV 95.8 (H) 07/11/2018    MCHC 32.3 07/11/2018    RDW 12.3 07/11/2018    PLT 329 07/11/2018    MPV 9.7 07/11/2018    SEG 38 07/11/2018    LYMPHS 52 07/11/2018    MONOS 8 07/11/2018    EOS 1 07/11/2018    BASOS 1 07/11/2018     Lab Results   Component Value Date    INR 0.9 07/11/2018     No results found for: ARTPH, ARTPO2, ARTPCO2    Anesthesia Plan:  Risks and Benefits of Anesthesia  I have personally performed an  appropriate pre-anesthesia physical exam of the patient (including heart, lungs, and airway) prior to the anesthetic and reviewed the pertinent medical history, drug and allergy history, laboratory and imaging studies and consultations.   I have determined that the patient has had adequate assessment and testing.  I have validated the documentation of these elements of the patient exam and/or have made necessary changes to reflect my own observations during my pre-anesthesia exam.  Anesthetic techniques, invasive monitors, anesthetic drugs for induction, maintenance and post-operative analgesia, risks and alternatives have been explained to the patient and/or patient's representatives.    I have prescribed the anesthetic plan:         Planned anesthesia method: General         ASA 3 (Severe systemic disease)     Potential anesthesia problems identified and risks including but not limited to the following were discussed with patient and/or patient's representative: Adverse or allergic drug reaction, Administration of blood products, Recall, Nerve injury, Dental injury or sore throat, Ocular injury, Injury to brain, heart and other organs and Death    No Beta Blocker Indicated: Patient not on beta blockers    Planned monitoring method: Routine monitoring and Arterial line monitoring    Informed Consent:  Anesthetic plan and risks discussed with Patient.  Use of blood products discussed with patient who consented to blood products.         Plan discussed with CRNA and Surgeon.

## 2018-12-30 NOTE — Patient Instructions (Signed)
PREOPERATIVE SURGICAL INFORMATION    Your surgery is currently scheduled at [facility] hospital/facility/department on 01/02/18  With a planned report time of 520am    ?  FACILITY ADDRESS          [facility], [address] [place], [place] [address]     The drop off point is the west entrance of [facility] Please use valet parking service at the [facility] entrance. It is available from 5 am to 5 pm for same rates as self parking   If you arrive before 5 am or after 5 pm please have driver drop you off at [facility] entrance park in [facility] Structure            QUESTIONS  If you have any questions between now and the day of your surgery, please do not hesitate to call:     [facility]: [phone]       DAY OF SURGERY ARRIVAL TIME:  On the day of your Surgery/Procedure, please arrive at the time provided by the surgery/preop team. If you have any questions regarding your arrival time, please call:     Preoperative Surgical Admissions at [facility]: [phone]         MEDICATION INSTRUCTIONS:     MEDICATIONS TO STOP 7 DAYS BEFORE SURGERY/PROCEDURE:     PLEASE HOLD ASPIRIN AND ALL NSAIDS (non-steroidal anti-inflammatory drugs) SUCH AS advil, aleve, motrin, ibuprofen, relafen, lodine, feldene, Diclofenac, voltaren, indomethacin, naproxen, celebrex, Mobic.        Please hold vitamins, supplements, herbs & fish oil.     REGULAR PRESCRIPTION MEDICATIONS:     Regular prescription medications should be taken the day of surgery with sips of water      AFTER YOUR VISIT WITH [place], IF YOU START TAKING A NEW MEDICATION BEFORE SURGERY, PLEASE CALL us TO MAKE SURE IT IS SAFE TO TAKE & WON'T EFFECT YOUR SURGERY.           TO DO LIST:    Surgical/procedure patients are required to have COVID 19 screening test 24- 72 hrs prior to surgery. Please call [phone] to schedule (open 7 days a week). An  appointment is NEEDED at all of the [place] drive up testing locations.       ?  EATING/DRINKING     PLEASE DO NOT EAT OR DRINK ANYTHING AFTER MIDNIGHT THE NIGHT BEFORE SURGERY.   ?  Preparing for your Surgery:     Please wear clean loose-fitting clothes and leave valuables at home        Bring a picture ID and your insurance card, and be prepared to pay your deductible or co-insurance by cash, check, or credit card when you arrive.       If you are going home after your surgery, please make sure to arrange for an adult to drive you home. If you do not do so, your surgery may be cancelled.        . After showering, do not apply lotion, cream, powder, deodorant, or hair conditioner.  Do not shave or remove body hair. Facial shaving is permitted. If you are having head surgery, ask your doctor whether you can shave.  On The Day of Your Surgery:      Check in at the location mentioned above        You will meet your anesthesia and surgery teams before surgery.  Once surgery is over, you will wake up in the recovery room where you will be able to see your friends/family.        Once your time in the recovery room is complete, you will either go home or be admitted as planned.        If you go home, someone will need to stay with you for the first 24 hours after surgery.       Additional information about what to expect before & after surgery is available online at:  [email or web address]    [email or web address]  "You-tube" for [place] before surgery and [place] after surgery    You medical records are available to you at [email or web address]  Select create account.     Preparing for your surgery    Shower with Chlorhexidine (CHG) soap to prevent infection    Instructions:   You should shower with CHG soap a minimum of three times before your surgery, or more often as directed by your surgeon.    Showering several times before  surgery blocks germ growth and provides the best protection when used at least 3 times in a row.                      3 =                    +                  +               At least 3 showers       the morning the night the morning of   before surgery before surgery before surgery admission to surgery               Date:_______       Date:_______            Date:_______    How to shower with CHG Soap:  1. Rinse your body with warm water.  2. Wash your hair with regular shampoo. Rinse your hair with water. If you are having neck surgery, use CHG soap instead of your regular shampoo to wash your hair. Rinse your hair with water.  3. Wet a clean sponge. Turn off the water. Apply CHG liberally.  4. Firmly massage all areas: neck, arms, chest, back, abdomen, hips, groin, genitals (external only) and buttocks. Clean your legs and feet and between your fingers and toes. Pay special attention to the site of your surgery and all surrounding skin. Ask for help to clean your back if you have a spinal surgery.   5. Lather again before rinsing.  6. Turn on the water and rinse CHG off your body.  7. Dry off with a clean towel.  8. Don't apply lotions or powders.   9. Use clean clothes and freshly laundered bed linens.          Repeat steps 1- 9 each time you shower.      Caution: When using CHG soap, avoid contact with eyes, nose, ear canals and mouth.      Important reminders:  . Do not use any other soaps or body wash when using CHG. Other soaps can block the CHG benefits.  . After showering, do not apply lotion, cream, powder, deodorant, or hair conditioner.  . Do not shave or remove body hair. Facial shaving is permitted. If you are having head surgery, ask your doctor  whether you can shave.  . CHG is safe to use on minor wounds, rashes, burns, and over staples and stiches.  . Allergic reactions are rare but may occur. If you have an allergic reaction, stop using CHG and call your doctor if you have a skin  irritation.  . If you are allergic to CHG, please follow the bathing instructions above using an over-the-counter regular soap instead of CHG.

## 2019-01-01 ENCOUNTER — Other Ambulatory Visit: Payer: Medicaid Other | Attending: Anesthesiology

## 2019-01-01 DIAGNOSIS — Z1159 Encounter for screening for other viral diseases: Secondary | ICD-10-CM | POA: Insufficient documentation

## 2019-01-01 DIAGNOSIS — Z01812 Encounter for preprocedural laboratory examination: Secondary | ICD-10-CM | POA: Insufficient documentation

## 2019-01-01 DIAGNOSIS — Z01818 Encounter for other preprocedural examination: Secondary | ICD-10-CM

## 2019-01-01 NOTE — Interdisciplinary (Signed)
Verified patient with two identifiers.  Dyann D RN obtained nasal swab from patient, patient tolerated well. Specimen sent to lab. Patient was given self care isolation instructions to follow while waiting for results.  Negative results will be mycharted to patient.  Positive results will be called to patient.

## 2019-01-02 LAB — COVID-19 CORONAVIRUS DETECTION ASSAY AT ~~LOC~~ LAB: COVID-19 Coronavirus Result: NOT DETECTED

## 2019-01-03 ENCOUNTER — Inpatient Hospital Stay (HOSPITAL_COMMUNITY): Payer: Medicaid Other | Admitting: General Surgery

## 2019-01-03 ENCOUNTER — Encounter (HOSPITAL_COMMUNITY)
Admission: RE | Disposition: A | Discharge status: Home Health | Payer: Self-pay | Attending: Orthopaedic Surgery of the Spine

## 2019-01-03 ENCOUNTER — Inpatient Hospital Stay (HOSPITAL_COMMUNITY): Payer: Medicaid Other | Admitting: Anesthesiology

## 2019-01-03 ENCOUNTER — Inpatient Hospital Stay
Admission: RE | Admit: 2019-01-03 | Discharge: 2019-01-07 | DRG: 454 | Disposition: A | Discharge status: Home Health | Payer: Medicaid Other | Attending: Orthopaedic Surgery of the Spine | Admitting: Orthopaedic Surgery of the Spine

## 2019-01-03 ENCOUNTER — Inpatient Hospital Stay (HOSPITAL_BASED_OUTPATIENT_CLINIC_OR_DEPARTMENT_OTHER): Payer: Medicaid Other

## 2019-01-03 DIAGNOSIS — Z79891 Long term (current) use of opiate analgesic: Secondary | ICD-10-CM

## 2019-01-03 DIAGNOSIS — F988 Other specified behavioral and emotional disorders with onset usually occurring in childhood and adolescence: Secondary | ICD-10-CM | POA: Diagnosis present

## 2019-01-03 DIAGNOSIS — Z789 Other specified health status: Secondary | ICD-10-CM

## 2019-01-03 DIAGNOSIS — T84296A Other mechanical complication of internal fixation device of vertebrae, initial encounter: Secondary | ICD-10-CM

## 2019-01-03 DIAGNOSIS — D62 Acute posthemorrhagic anemia: Secondary | ICD-10-CM | POA: Diagnosis not present

## 2019-01-03 DIAGNOSIS — Z79899 Other long term (current) drug therapy: Secondary | ICD-10-CM

## 2019-01-03 DIAGNOSIS — K219 Gastro-esophageal reflux disease without esophagitis: Secondary | ICD-10-CM | POA: Diagnosis present

## 2019-01-03 DIAGNOSIS — Z885 Allergy status to narcotic agent status: Secondary | ICD-10-CM

## 2019-01-03 DIAGNOSIS — Z882 Allergy status to sulfonamides status: Secondary | ICD-10-CM

## 2019-01-03 DIAGNOSIS — M4316 Spondylolisthesis, lumbar region: Secondary | ICD-10-CM

## 2019-01-03 DIAGNOSIS — M5430 Sciatica, unspecified side: Secondary | ICD-10-CM

## 2019-01-03 DIAGNOSIS — G629 Polyneuropathy, unspecified: Secondary | ICD-10-CM | POA: Diagnosis present

## 2019-01-03 DIAGNOSIS — M48062 Spinal stenosis, lumbar region with neurogenic claudication: Secondary | ICD-10-CM

## 2019-01-03 DIAGNOSIS — Z969 Presence of functional implant, unspecified: Secondary | ICD-10-CM

## 2019-01-03 DIAGNOSIS — Z7409 Other reduced mobility: Secondary | ICD-10-CM

## 2019-01-03 DIAGNOSIS — M5442 Lumbago with sciatica, left side: Secondary | ICD-10-CM

## 2019-01-03 DIAGNOSIS — F419 Anxiety disorder, unspecified: Secondary | ICD-10-CM | POA: Diagnosis present

## 2019-01-03 DIAGNOSIS — Z8781 Personal history of (healed) traumatic fracture: Secondary | ICD-10-CM

## 2019-01-03 DIAGNOSIS — M48061 Spinal stenosis, lumbar region without neurogenic claudication: Secondary | ICD-10-CM | POA: Diagnosis present

## 2019-01-03 DIAGNOSIS — M5441 Lumbago with sciatica, right side: Secondary | ICD-10-CM

## 2019-01-03 DIAGNOSIS — M5116 Intervertebral disc disorders with radiculopathy, lumbar region: Secondary | ICD-10-CM | POA: Diagnosis present

## 2019-01-03 DIAGNOSIS — M4726 Other spondylosis with radiculopathy, lumbar region: Secondary | ICD-10-CM | POA: Diagnosis present

## 2019-01-03 DIAGNOSIS — Z981 Arthrodesis status: Secondary | ICD-10-CM

## 2019-01-03 DIAGNOSIS — Z658 Other specified problems related to psychosocial circumstances: Secondary | ICD-10-CM

## 2019-01-03 DIAGNOSIS — M545 Low back pain: Secondary | ICD-10-CM

## 2019-01-03 DIAGNOSIS — M5416 Radiculopathy, lumbar region: Secondary | ICD-10-CM

## 2019-01-03 DIAGNOSIS — G8929 Other chronic pain: Secondary | ICD-10-CM | POA: Insufficient documentation

## 2019-01-03 LAB — CBC WITH DIFF, BLOOD
ANC-Automated: 2.6 10*3/uL (ref 1.6–7.0)
Abs Basophils: 0 10*3/uL (ref <0.1)
Abs Eosinophils: 0.1 10*3/uL (ref 0.1–0.5)
Abs Lymphs: 3.7 10*3/uL — ABNORMAL HIGH (ref 0.8–3.1)
Abs Monos: 0.8 10*3/uL (ref 0.2–0.8)
Basophils: 0 %
Eosinophils: 2 %
Hct: 39.5 % (ref 34.0–45.0)
Hgb: 13.1 gm/dL (ref 11.2–15.7)
Lymphocytes: 51 %
MCH: 30.1 pg (ref 26.0–32.0)
MCHC: 33.2 g/dL (ref 32.0–36.0)
MCV: 90.8 um3 (ref 79.0–95.0)
MPV: 9 fL — ABNORMAL LOW (ref 9.4–12.4)
Monocytes: 11 %
Plt Count: 244 10*3/uL (ref 140–370)
RBC: 4.35 10*6/uL (ref 3.90–5.20)
RDW: 12.7 % (ref 12.0–14.0)
Segs: 35 %
WBC: 7.2 10*3/uL (ref 4.0–10.0)

## 2019-01-03 LAB — POTASSIUM, ART WHOLE BLOOD
Potassium, Art: 3.5 mmol/L (ref 3.5–5.0)
Potassium, Art: 4.1 mmol/L (ref 3.5–5.0)
Potassium, Art: 4 mmol/L (ref 3.5–5.0)
Potassium, Art: 3.7 mmol/L (ref 3.5–5.0)

## 2019-01-03 LAB — ABG+O2HBA+O2S A+O2CNA
BE, Art: -0.5 mmol/L (ref <=2.3)
BE, Art: -3.6 mmol/L — ABNORMAL LOW (ref <=2.3)
BE, Art: -4.7 mmol/L — ABNORMAL LOW (ref <=2.3)
BE, Art: -6.5 mmol/L — ABNORMAL LOW (ref <=2.3)
FIO2: 100 %
FIO2: 60 %
FIO2: 60 %
FIO2: 60 %
HCO3, Art: 25 mmol/L (ref 20–29)
HCO3, Art: 22 mmol/L (ref 20–29)
HCO3, Art: 21 mmol/L (ref 20–29)
HCO3, Art: 20 mmol/L (ref 20–29)
Hct (Est), Art: 37 % (ref 36–46)
Hct (Est), Art: 37 % (ref 36–46)
Hct (Est), Art: 38 % (ref 36–46)
Hct (Est), Art: 30 % — ABNORMAL LOW (ref 36–46)
Hgb, Art: 12.4 g/dL (ref 12.0–16.0)
Hgb, Art: 12.3 g/dL (ref 12.0–16.0)
Hgb, Art: 12.7 g/dL (ref 12.0–16.0)
Hgb, Art: 10 g/dL — ABNORMAL LOW (ref 12.0–16.0)
O2 Content, Art: 18 vol % (ref 15.0–23.0)
O2 Content, Art: 17.4 vol % (ref 15.0–23.0)
O2 Content, Art: 18 vol % (ref 15.0–23.0)
O2 Content, Art: 14.4 vol % — ABNORMAL LOW (ref 15.0–23.0)
O2 Hgb, Art: 97.7 — ABNORMAL HIGH (ref 95.0–97.0)
O2 Hgb, Art: 96.8 (ref 95.0–97.0)
O2 Hgb, Art: 97.5 — ABNORMAL HIGH (ref 95.0–97.0)
O2 Hgb, Art: 97.7 — ABNORMAL HIGH (ref 95.0–97.0)
O2 Sat, Art: 100.1 % — ABNORMAL HIGH (ref 94.0–100.0)
O2 Sat, Art: 99.9 % (ref 94.0–100.0)
O2 Sat, Art: 100.3 % — ABNORMAL HIGH (ref 94.0–100.0)
O2 Sat, Art: 100.6 % — ABNORMAL HIGH (ref 94.0–100.0)
Temp: 36 'C
Temp: 36 'C
Temp: 35.9 'C
Temp: 37 'C
pCO2, Art (T): 32 mmHg — ABNORMAL LOW (ref 36–46)
pCO2, Art (T): 36 mmHg (ref 36–46)
pCO2, Art (T): 35 mmHg — ABNORMAL LOW (ref 36–46)
pCO2, Art (T): 41 mmHg (ref 36–46)
pCO2, Art (Uncorr): 33 mmHg (ref 36–46)
pCO2, Art (Uncorr): 38 mmHg (ref 36–46)
pCO2, Art (Uncorr): 37 mmHg (ref 36–46)
pCO2, Art (Uncorr): 41 mmHg (ref 36–46)
pH, Art (T): 7.47 — ABNORMAL HIGH (ref 7.35–7.46)
pH, Art (T): 7.37 (ref 7.35–7.46)
pH, Art (T): 7.37 (ref 7.35–7.46)
pH, Art (T): 7.29 — ABNORMAL LOW (ref 7.35–7.46)
pH, Art (Uncorr): 7.45 (ref 7.35–7.46)
pH, Art (Uncorr): 7.36 (ref 7.35–7.46)
pH, Art (Uncorr): 7.35 (ref 7.35–7.46)
pH, Art (Uncorr): 7.29 (ref 7.35–7.46)
pO2, Art (T): 385 mmHg — ABNORMAL HIGH (ref 74–109)
pO2, Art (T): 277 mmHg — ABNORMAL HIGH (ref 74–109)
pO2, Art (T): 262 mmHg — ABNORMAL HIGH (ref 74–109)
pO2, Art (T): 260 mmHg — ABNORMAL HIGH (ref 74–109)
pO2, Art (Uncorr): 390 mmHg (ref 74–109)
pO2, Art (Uncorr): 282 mmHg (ref 74–109)
pO2, Art (Uncorr): 267 mmHg (ref 74–109)
pO2, Art (Uncorr): 260 mmHg (ref 74–109)

## 2019-01-03 LAB — BASIC METABOLIC PANEL, BLOOD
Anion Gap: 12 mmol/L (ref 7–15)
BUN: 8 mg/dL (ref 6–20)
Bicarbonate: 24 mmol/L (ref 22–29)
Calcium: 9.5 mg/dL (ref 8.5–10.6)
Chloride: 102 mmol/L (ref 98–107)
Creatinine: 0.85 mg/dL (ref 0.51–0.95)
GFR: >60 mL/min
Glucose: 98 mg/dL (ref 70–99)
Potassium: 3.5 mmol/L (ref 3.5–5.1)
Sodium: 138 mmol/L (ref 136–145)

## 2019-01-03 LAB — CALCIUM, IONIZED, ARTERIAL
Ca Ionized, Art: 1.21 mmol/L (ref 1.13–1.32)
Ca Ionized, Art: 1.17 mmol/L (ref 1.13–1.32)
Ca Ionized, Art: 1.14 mmol/L (ref 1.13–1.32)
Ca Ionized, Art: 1.06 mmol/L — ABNORMAL LOW (ref 1.13–1.32)

## 2019-01-03 LAB — GLUCOSE, ARTERIAL WHOLE BLOOD
Glucose, Art: 92 mg/dL (ref 65–110)
Glucose, Art: 95 mg/dL (ref 65–110)
Glucose, Art: 130 mg/dL — ABNORMAL HIGH (ref 65–110)
Glucose, Art: 129 mg/dL — ABNORMAL HIGH (ref 65–110)

## 2019-01-03 LAB — TYPE & SCREEN
ABO/RH: O POS
Antibody Screen: NEGATIVE

## 2019-01-03 LAB — SODIUM, ART WHOLE BLOOD
Sodium, Art: 135 mmol/L (ref 135–145)
Sodium, Art: 135 mmol/L (ref 135–145)
Sodium, Art: 137 mmol/L (ref 135–145)
Sodium, Art: 141 mmol/L (ref 135–145)

## 2019-01-03 LAB — GLYCOSYLATED HGB(A1C), BLOOD: Glyco Hgb (A1C): 4.7 % — ABNORMAL LOW (ref 4.8–5.8)

## 2019-01-03 SURGERY — FUSION, SPINE, LUMBAR, XLIF, 1 LEVEL
Anesthesia: General | Site: Spine Lumbar | Wound class: Class I (Clean)

## 2019-01-03 MED ORDER — CALCIUM CARBONATE ANTACID 500 MG OR CHEW
1000.0000 mg | CHEWABLE_TABLET | Freq: Four times a day (QID) | ORAL | Status: DC | PRN
Start: 2019-01-03 — End: 2019-01-07
  Administered 2019-01-07: 1000 mg via ORAL
  Filled 2019-01-03: qty 2

## 2019-01-03 MED ORDER — SODIUM CHLORIDE 0.9 % IV SOLN
INTRAVENOUS | Status: DC | PRN
Start: 2019-01-03 — End: 2019-01-03
  Administered 2019-01-03: 09:00:00 via INTRAVENOUS

## 2019-01-03 MED ORDER — DIPHENHYDRAMINE HCL 25 MG OR TABS OR CAPS CUSTOM
25.0000 mg | ORAL_CAPSULE | ORAL | Status: DC | PRN
Start: 2019-01-03 — End: 2019-01-07

## 2019-01-03 MED ORDER — DIPHENHYDRAMINE HCL 50 MG/ML IJ SOLN
12.5000 mg | Freq: Once | INTRAMUSCULAR | Status: DC | PRN
Start: 2019-01-03 — End: 2019-01-03

## 2019-01-03 MED ORDER — FAMOTIDINE IN NACL 20 MG/50ML IV SOLN
20.0000 mg | Freq: Two times a day (BID) | INTRAVENOUS | Status: DC
Start: 2019-01-03 — End: 2019-01-07

## 2019-01-03 MED ORDER — MULTI-VITAMINS PO TABS
1.0000 | ORAL_TABLET | Freq: Every day | ORAL | Status: DC
Start: 2019-01-04 — End: 2019-01-07
  Administered 2019-01-04 – 2019-01-07 (×4): 1 via ORAL
  Filled 2019-01-03 (×4): qty 1

## 2019-01-03 MED ORDER — THROMBIN 20000 UNIT EX SOLR
CUTANEOUS | Status: DC | PRN
Start: 2019-01-03 — End: 2019-01-03
  Administered 2019-01-03: 20000 [IU] via TOPICAL

## 2019-01-03 MED ORDER — ROPIVACAINE HCL 5 MG/ML IJ SOLN
INTRAMUSCULAR | Status: DC | PRN
Start: 2019-01-03 — End: 2019-01-03
  Administered 2019-01-03: 16:00:00 30 mL

## 2019-01-03 MED ORDER — ONDANSETRON HCL 4 MG/2ML IV SOLN
INTRAMUSCULAR | Status: DC | PRN
Start: 2019-01-03 — End: 2019-01-03
  Administered 2019-01-03: 4 mg via INTRAVENOUS

## 2019-01-03 MED ORDER — MENTHOL 3 MG MT LOZG
1.0000 | LOZENGE | Freq: Three times a day (TID) | OROMUCOSAL | Status: DC | PRN
Start: 2019-01-03 — End: 2019-01-07

## 2019-01-03 MED ORDER — BISACODYL 10 MG RE SUPP
10.0000 mg | Freq: Every day | RECTAL | Status: DC | PRN
Start: 2019-01-03 — End: 2019-01-07
  Administered 2019-01-06: 10 mg via RECTAL
  Filled 2019-01-03: qty 1

## 2019-01-03 MED ORDER — NALOXONE HCL 0.4 MG/ML IJ SOLN
0.1000 mg | INTRAMUSCULAR | Status: DC | PRN
Start: 2019-01-03 — End: 2019-01-03

## 2019-01-03 MED ORDER — MIDAZOLAM HCL 2 MG/2ML IJ SOLN
INTRAMUSCULAR | Status: DC | PRN
Start: 2019-01-03 — End: 2019-01-03
  Administered 2019-01-03: 07:00:00 2 mg via INTRAVENOUS

## 2019-01-03 MED ORDER — SENNA 8.6 MG OR TABS
2.0000 | ORAL_TABLET | Freq: Every morning | ORAL | Status: DC
Start: 2019-01-04 — End: 2019-01-07
  Administered 2019-01-04 – 2019-01-07 (×4): 17.2 mg via ORAL
  Filled 2019-01-03 (×4): qty 2

## 2019-01-03 MED ORDER — LIDOCAINE HCL 20 MG/ML IV INJECTION WRAPPED RECORD
INTRAVENOUS | Status: DC | PRN
Start: 2019-01-03 — End: 2019-01-03
  Administered 2019-01-03: 09:00:00 60 mg via INTRAVENOUS

## 2019-01-03 MED ORDER — ACETAMINOPHEN 325 MG PO TABS
975.0000 mg | ORAL_TABLET | Freq: Three times a day (TID) | ORAL | Status: DC
Start: 2019-01-03 — End: 2019-01-07
  Administered 2019-01-03 – 2019-01-07 (×11): 975 mg via ORAL
  Filled 2019-01-03 (×11): qty 3

## 2019-01-03 MED ORDER — LACTATED RINGERS IV SOLN
INTRAVENOUS | Status: DC | PRN
Start: 2019-01-03 — End: 2019-01-03
  Administered 2019-01-03 (×2): via INTRAVENOUS

## 2019-01-03 MED ORDER — FENTANYL CITRATE (PF) 50 MCG/ML IJ SOLN
25.0000 ug | INTRAMUSCULAR | Status: DC | PRN
Start: 2019-01-03 — End: 2019-01-03

## 2019-01-03 MED ORDER — PHENYLEPHRINE DILUTION 100 MCG/ML IJ SOLN
INTRAVENOUS | Status: DC | PRN
Start: 2019-01-03 — End: 2019-01-03
  Administered 2019-01-03: 12:00:00 100 ug via INTRAVENOUS

## 2019-01-03 MED ORDER — LIDOCAINE HCL (PF) 1 % IJ SOLN
0.1000 mL | INTRAMUSCULAR | Status: DC | PRN
Start: 2019-01-03 — End: 2019-01-03

## 2019-01-03 MED ORDER — FAMOTIDINE 20 MG OR TABS
20.0000 mg | ORAL_TABLET | Freq: Two times a day (BID) | ORAL | Status: DC
Start: 2019-01-03 — End: 2019-01-07
  Administered 2019-01-03 – 2019-01-07 (×8): 20 mg via ORAL
  Filled 2019-01-03 (×8): qty 1

## 2019-01-03 MED ORDER — FENTANYL IV BOLUS FROM BAG
25.0000 ug | INTRAMUSCULAR | Status: DC | PRN
Start: 2019-01-03 — End: 2019-01-03

## 2019-01-03 MED ORDER — LACTATED RINGERS IV SOLN
INTRAVENOUS | Status: DC
Start: 2019-01-03 — End: 2019-01-03

## 2019-01-03 MED ORDER — MIDAZOLAM HCL 2 MG/2ML IJ SOLN
2.0000 mg | Freq: Once | INTRAMUSCULAR | Status: DC
Start: 2019-01-03 — End: 2019-01-03
  Filled 2019-01-03: qty 2

## 2019-01-03 MED ORDER — CEFAZOLIN SODIUM 1 GM IJ SOLR
INTRAMUSCULAR | Status: DC | PRN
Start: 2019-01-03 — End: 2019-01-03
  Administered 2019-01-03 (×2): 2000 mg via INTRAVENOUS

## 2019-01-03 MED ORDER — ACETAMINOPHEN 10 MG/ML IV SOLN
1000.0000 mg | Freq: Once | INTRAVENOUS | Status: AC
Start: 2019-01-03 — End: 2019-01-03
  Administered 2019-01-03: 1000 mg via INTRAVENOUS
  Filled 2019-01-03: qty 100

## 2019-01-03 MED ORDER — VITAMIN C 500 MG OR TABS
500.0000 mg | ORAL_TABLET | Freq: Two times a day (BID) | ORAL | Status: DC
Start: 2019-01-03 — End: 2019-01-07
  Administered 2019-01-03 – 2019-01-07 (×8): 500 mg via ORAL
  Filled 2019-01-03 (×8): qty 1

## 2019-01-03 MED ORDER — DOCUSATE SODIUM 250 MG OR CAPS
250.0000 mg | ORAL_CAPSULE | Freq: Every evening | ORAL | Status: DC
Start: 2019-01-03 — End: 2019-01-07
  Administered 2019-01-03 – 2019-01-06 (×4): 250 mg via ORAL
  Filled 2019-01-03 (×4): qty 1

## 2019-01-03 MED ORDER — MEPERIDINE HCL 25 MG/ML IJ SOLN
12.5000 mg | INTRAMUSCULAR | Status: DC | PRN
Start: 2019-01-03 — End: 2019-01-03

## 2019-01-03 MED ORDER — ONDANSETRON HCL 4 MG/2ML IV SOLN
4.0000 mg | Freq: Four times a day (QID) | INTRAMUSCULAR | Status: DC | PRN
Start: 2019-01-03 — End: 2019-01-07

## 2019-01-03 MED ORDER — EPHEDRINE SULFATE 50 MG/ML IJ SOLN
INTRAMUSCULAR | Status: DC | PRN
Start: 2019-01-03 — End: 2019-01-03
  Administered 2019-01-03: 13:00:00 10 mg via INTRAVENOUS
  Administered 2019-01-03 (×2): 5 mg via INTRAVENOUS

## 2019-01-03 MED ORDER — GABAPENTIN 300 MG OR CAPS
300.0000 mg | ORAL_CAPSULE | Freq: Three times a day (TID) | ORAL | Status: DC
Start: 2019-01-03 — End: 2019-01-07
  Administered 2019-01-03 – 2019-01-07 (×11): 300 mg via ORAL
  Filled 2019-01-03 (×11): qty 1

## 2019-01-03 MED ORDER — PROPOFOL 1000 MG/100ML IV EMUL
INTRAVENOUS | Status: DC | PRN
Start: 2019-01-03 — End: 2019-01-03
  Administered 2019-01-03 (×2): 125 ug/kg/min via INTRAVENOUS
  Administered 2019-01-03: 100 ug/kg/min via INTRAVENOUS

## 2019-01-03 MED ORDER — NARCOTIC DRIP (FOR PYXIS) PLACEHOLDER
Status: DC
Start: 2019-01-03 — End: 2019-01-03

## 2019-01-03 MED ORDER — ZOLPIDEM TARTRATE 5 MG OR TABS
5.0000 mg | ORAL_TABLET | Freq: Every evening | ORAL | Status: DC | PRN
Start: 2019-01-03 — End: 2019-01-07
  Administered 2019-01-03 – 2019-01-06 (×4): 5 mg via ORAL
  Filled 2019-01-03 (×4): qty 1

## 2019-01-03 MED ORDER — HYDROMORPHONE PCA 0.2 MG/ML SYRINGE
INTRAMUSCULAR | Status: DC
Start: 2019-01-03 — End: 2019-01-04
  Administered 2019-01-03: 18:00:00 via INTRAVENOUS
  Filled 2019-01-03: qty 50

## 2019-01-03 MED ORDER — FENTANYL CITRATE (PF) 250 MCG/5ML IJ SOLN
INTRAMUSCULAR | Status: DC | PRN
Start: 2019-01-03 — End: 2019-01-03
  Administered 2019-01-03: 150 ug via INTRAVENOUS
  Administered 2019-01-03: 100 ug via INTRAVENOUS

## 2019-01-03 MED ORDER — ROCURONIUM BROMIDE 100 MG/10ML IV SOLN
INTRAVENOUS | Status: DC | PRN
Start: 2019-01-03 — End: 2019-01-03
  Administered 2019-01-03: 50 mg via INTRAVENOUS

## 2019-01-03 MED ORDER — SODIUM CHLORIDE 0.9 % IV SOLN
INTRAVENOUS | Status: DC
Start: 2019-01-03 — End: 2019-01-07
  Administered 2019-01-03: 21:00:00 via INTRAVENOUS

## 2019-01-03 MED ORDER — ACETAMINOPHEN 325 MG PO TABS
975.0000 mg | ORAL_TABLET | Freq: Once | ORAL | Status: AC
Start: 2019-01-03 — End: 2019-01-03
  Administered 2019-01-03: 975 mg via ORAL
  Filled 2019-01-03: qty 3

## 2019-01-03 MED ORDER — VANCOMYCIN HCL 1 GM IV SOLR
INTRAVENOUS | Status: DC | PRN
Start: 2019-01-03 — End: 2019-01-03
  Administered 2019-01-03 (×2): 1000 mg via TOPICAL

## 2019-01-03 MED ORDER — TIZANIDINE HCL 4 MG OR TABS
4.0000 mg | ORAL_TABLET | Freq: Four times a day (QID) | ORAL | Status: DC | PRN
Start: 2019-01-03 — End: 2019-01-07
  Administered 2019-01-04 (×3): 4 mg via ORAL
  Filled 2019-01-03 (×4): qty 1

## 2019-01-03 MED ORDER — ZINC SULFATE 220 MG OR CAPS
220.0000 mg | ORAL_CAPSULE | Freq: Every day | ORAL | Status: DC
Start: 2019-01-04 — End: 2019-01-07
  Administered 2019-01-04 – 2019-01-07 (×5): 220 mg via ORAL
  Filled 2019-01-03 (×4): qty 1

## 2019-01-03 MED ORDER — LIDOCAINE-EPINEPHRINE 1 %-1:100000 IJ SOLN
INTRAMUSCULAR | Status: DC | PRN
Start: 2019-01-03 — End: 2019-01-03
  Administered 2019-01-03: 10 mL

## 2019-01-03 MED ORDER — GABAPENTIN 250 MG/5ML OR SOLN
300.0000 mg | Freq: Once | ORAL | Status: AC
Start: 2019-01-03 — End: 2019-01-03
  Administered 2019-01-03: 300 mg via ORAL
  Filled 2019-01-03 (×2): qty 6

## 2019-01-03 MED ORDER — HYDROMORPHONE HCL 1 MG/ML IJ SOLN
0.5000 mg | INTRAMUSCULAR | Status: DC | PRN
Start: 2019-01-03 — End: 2019-01-03
  Administered 2019-01-03 (×3): 0.5 mg via INTRAVENOUS
  Filled 2019-01-03 (×3): qty 0.5

## 2019-01-03 MED ORDER — FENTANYL CITRATE (PF) 50 MCG/ML IJ SOLN
50.0000 ug | INTRAMUSCULAR | Status: DC | PRN
Start: 2019-01-03 — End: 2019-01-03
  Administered 2019-01-03 (×2): 50 ug via INTRAVENOUS
  Filled 2019-01-03 (×2): qty 1

## 2019-01-03 MED ORDER — LIDOCAINE 4 % EX PTCH
2.0000 | MEDICATED_PATCH | CUTANEOUS | Status: DC
Start: 2019-01-04 — End: 2019-01-07
  Administered 2019-01-04 – 2019-01-07 (×4): 2 via TRANSDERMAL
  Filled 2019-01-03 (×4): qty 2

## 2019-01-03 MED ORDER — DIAZEPAM 5 MG OR TABS
5.0000 mg | ORAL_TABLET | Freq: Two times a day (BID) | ORAL | Status: DC | PRN
Start: 2019-01-03 — End: 2019-01-07
  Administered 2019-01-03 – 2019-01-04 (×2): 5 mg via ORAL
  Filled 2019-01-03 (×2): qty 1

## 2019-01-03 MED ORDER — PSYLLIUM 58.12 % PO PACK
1.0000 | PACK | Freq: Every day | ORAL | Status: DC
Start: 2019-01-04 — End: 2019-01-07
  Administered 2019-01-05 – 2019-01-07 (×3): 1 via ORAL
  Filled 2019-01-03 (×3): qty 1

## 2019-01-03 MED ORDER — POLYETHYLENE GLYCOL 3350 OR PACK
17.0000 g | PACK | Freq: Every day | ORAL | Status: DC
Start: 2019-01-04 — End: 2019-01-07
  Administered 2019-01-04 – 2019-01-06 (×3): 17 g via ORAL
  Filled 2019-01-03 (×3): qty 1

## 2019-01-03 MED ORDER — DEXAMETHASONE SODIUM PHOSPHATE 4 MG/ML IJ SOLN (CUSTOM)
INTRAMUSCULAR | Status: DC | PRN
Start: 2019-01-03 — End: 2019-01-03
  Administered 2019-01-03 (×2): 10 mg via INTRAVENOUS

## 2019-01-03 MED ORDER — MAGNESIUM HYDROXIDE 400 MG/5ML OR SUSP
30.0000 mL | Freq: Every evening | ORAL | Status: DC | PRN
Start: 2019-01-03 — End: 2019-01-07
  Administered 2019-01-06: 16:00:00 30 mL via ORAL
  Filled 2019-01-03: qty 30

## 2019-01-03 MED ORDER — SODIUM CHLORIDE 0.9 % IV SOLN
2000.0000 mg | Freq: Three times a day (TID) | INTRAVENOUS | Status: AC
Start: 2019-01-03 — End: 2019-01-04
  Administered 2019-01-03 – 2019-01-04 (×2): 2000 mg via INTRAVENOUS
  Filled 2019-01-03 (×2): qty 2000

## 2019-01-03 MED ORDER — ONDANSETRON HCL 4 MG/2ML IV SOLN
4.0000 mg | Freq: Once | INTRAMUSCULAR | Status: DC | PRN
Start: 2019-01-03 — End: 2019-01-03

## 2019-01-03 MED ORDER — SODIUM CHLORIDE 0.9 % IV SOLN
INTRAVENOUS | Status: DC | PRN
Start: 2019-01-03 — End: 2019-01-03
  Administered 2019-01-03: 09:00:00 10 ug/min via INTRAVENOUS
  Administered 2019-01-03: 25 ug/min via INTRAVENOUS
  Administered 2019-01-03: 15 ug/min via INTRAVENOUS
  Administered 2019-01-03: 40 ug/min via INTRAVENOUS

## 2019-01-03 MED ORDER — ALBUMIN HUMAN 5 % IV SOLN
INTRAVENOUS | Status: DC | PRN
Start: 2019-01-03 — End: 2019-01-03
  Administered 2019-01-03 (×2): via INTRAVENOUS

## 2019-01-03 MED ORDER — ALPRAZOLAM 0.25 MG OR TABS
0.2500 mg | ORAL_TABLET | Freq: Every day | ORAL | Status: DC | PRN
Start: 2019-01-03 — End: 2019-01-07
  Administered 2019-01-04: 0.25 mg via ORAL
  Filled 2019-01-03: qty 1

## 2019-01-03 MED ORDER — PROPOFOL 200 MG/20ML IV EMUL
INTRAVENOUS | Status: DC | PRN
Start: 2019-01-03 — End: 2019-01-03
  Administered 2019-01-03: 09:00:00 200 mg via INTRAVENOUS

## 2019-01-03 MED ORDER — FENTANYL INFUSION 10 MCG/ML (PREMADE)
0.0000 ug/h | Status: DC
Start: 2019-01-03 — End: 2019-01-03
  Administered 2019-01-03: 50 ug/h via INTRAVENOUS
  Administered 2019-01-03: 100 ug/h via INTRAVENOUS
  Administered 2019-01-03: 200 ug/h via INTRAVENOUS
  Administered 2019-01-03: 150 ug/h via INTRAVENOUS
  Filled 2019-01-03: qty 250

## 2019-01-03 SURGICAL SUPPLY — 76 items
BLADE SURGEON #10 STERILE (Knives/Blades) ×2 IMPLANT
BONE CANCELLOUS CHIPS 1.7-10 MM 15 ML ALLOGRAFT FREEZE DRIED (Bone/chips/putty) ×2 IMPLANT
BONE GRAFT SUB FORMAGRAFT XL BLOCK, LARGE, SYNTHETIC (Bone filler/cement- synthetic) ×2 IMPLANT
BRUSH SRGN SCRUB CHG 4% (Misc Medical Supply) ×4 IMPLANT
CAGE XL WIDE 12X22X50MM  15 DEG (Spine cages/spacers/discs/fixation) ×2 IMPLANT
CAUTERY TIP EDGE BLADE ELECTRODE 2.75", INSULATED (Misc Surgical Supply) ×2 IMPLANT
CAUTERY TIP EDGE BLADE ELECTRODE 6.5", INSULATED (Cautery) IMPLANT
CONTAINER PRECISION SPECIMEN, 4OZ- STERILE (Misc Medical Supply) ×2 IMPLANT
CORD VALLEYLAB BIPOLAR FORCEP- SINGE USE (Cautery) ×2
DBM PUTTY EVO3 ACELL 10ML (Uncategorized implant- biologic) ×2 IMPLANT
DERMABOND ADVANCE 0.7ML (Suture) ×4
DISSECTOR 3 ENDOPATH BLUNT TIP 5MM X 40.5CM (Lap/Endo/Arthroscopy) ×2 IMPLANT
DRAPE C-ARMOR FLUOROSCAN IMAGING (Drapes/towels) ×2 IMPLANT
DRESSING MEPILEX BORDER LITE 2 X 5 IN (Dressings/packing) ×6
DRESSING MEPILEX BORDER SACRUM SM 16 X 20CM (Dressings/packing) ×18
DRESSING MEPILEX BORDER SILICONE 6X6 IN (Dressings/packing) ×2 IMPLANT
DRESSING TEGADERM 6" X 8" (Dressings/packing) ×2 IMPLANT
DRESSING TEGADERM HP 4X4 (Dressings/packing) ×4 IMPLANT
DRILL BIT STD SST TWIST 2 MM X 127MM (Drills/Bits/Burs/Taps/Reamers) ×10 IMPLANT
DRILL PRECISION NEURO 3.0 X 3.8MM (Drills/Bits/Burs/Taps/Reamers) ×2
FORCEP BIPOLAR 8 1/2 1.0MM TIP SINGLE USE (Misc Medical Supply) ×2
FORCEP BIPOLAR BAYONET 10.5" 1.0MM TIP (Misc Medical Supply) ×2 IMPLANT
GLOVE BIOGEL INDICATOR UNDERGLOVE SIZE 8 (Gloves/gowns) ×4 IMPLANT
GLOVE BIOGEL INDICATOR UNDERGLOVE SIZE 8.5 (Gloves/gowns) ×4 IMPLANT
GLOVE BIOGEL PI INDICATOR SIZE 7.5 (Gloves/gowns) ×2 IMPLANT
GLOVE BIOGEL PI INDICATOR SIZE 8 (Gloves/gowns) ×2 IMPLANT
GLOVE BIOGEL PI ULTRATOUCH SIZE 7.5 (Gloves/gowns) ×2 IMPLANT
GLOVE SURGEON BIOGEL SIZE 7 (Gloves/gowns) ×2 IMPLANT
GLOVE SURGICAL BIOGEL SIZE 8 (Gloves/gowns) ×10 IMPLANT
GLOVE SURGICAL BIOGEL SIZE 8.5 (Gloves/gowns) ×4 IMPLANT
GOWN SIRUS XLG BLUE, AAMI LVL 4 (Gloves/Gowns)
GOWN SURGICAL ULTRA XL BLUE, AAMI LVL 3 (Gloves/Gowns) ×8 IMPLANT
GOWN SURGICAL XXLG BLUE (Gloves/Gowns) ×6
HEADREST PRONE POSITION (Misc Surgical Supply) ×2 IMPLANT
HEMOSTATIC MATRIX SURGIFLO W/O THROMBIN 2991 (Hemostatic agents/wax/sealants-absorbable) ×4 IMPLANT
IOM ONLY STANDBY (Services) ×6 IMPLANT
IOM ONLY SUPPLIES (Services) ×2 IMPLANT
JMC ONLY- O.R. CAMERA COVER LIGHT, STERILE (Misc Surgical Supply) IMPLANT
KIT DILATOR NEUROVISION NVM5 XLIF, DISP (Kits/Sets/Trays) ×2 IMPLANT
KIT INFUSE BONE GRAFT SMALL (Grafts) ×2 IMPLANT
MILL BONE STRYKER MEDIUM, DISP (Misc Surgical Supply) ×2 IMPLANT
MODULE MAXCESS 4 (Misc Medical Supply) ×2 IMPLANT
MODULE NEUROVISION NEEDLE (Misc Medical Supply) ×2 IMPLANT
PACK SPINAL SURGERY (Procedure Packs/kits) ×2 IMPLANT
PAD ARMBOARD CONV FOAM 2X8X20 (Misc Surgical Supply) ×4 IMPLANT
PAD GROUND VALLEYLAB REM ADULT E7507 (Misc Surgical Supply) ×6 IMPLANT
PEANUT SPONGES BIOSEAL IN HOLDER 5/PACK (Sponges) ×1 IMPLANT
PREP DURAPREP SURGICAL SOLUTION 26 ML (Misc Surgical Supply) ×4
PROBE NERVE STIMULATOR PRASS W/PROTECTED PIN OD 0.5MM MONOPOLAR (Misc Surgical Supply) ×2
PROTECTOR ULNAR NERVE PAD, YELLOW (Misc Surgical Supply) ×4 IMPLANT
PUTTY MAGNETOS 10CC 1-2MM (Bone/chips/putty) ×2 IMPLANT
ROD SPINE EXPEDIUM 5.5 X 35MM PRE-BENT (Spine cages/spacers/discs/fixation) IMPLANT
ROD SPINE EXPEDIUM 5.5 X 45MM PRE-BENT (Spine cages/spacers/discs/fixation) IMPLANT
ROD SPINE EXPEDIUM 5.5 X 50MM PRE-BENT (Spine cages/spacers/discs/fixation) ×4 IMPLANT
SCREW MIS TI CFX FEN POLI 8X45 (Screws/anchors/cables) ×6 IMPLANT
SCREW SET EXPEDIUM 5.5 SINGLE INNER (Screws/anchors/cables) ×8 IMPLANT
SCREW VIPER FENESTRATED CORTICAL 5.5 X 6 X 45MM (Screws/anchors/cables) ×2 IMPLANT
SEALER AQUAMANTYS 2.3 BIPOLAR (Lap/Endo/Arthroscopy) ×2 IMPLANT
SLEEVE SCD KNEE MEDIUM (Misc Medical Supply) ×2 IMPLANT
SOLUTION IRR POUR BTL 0.9% NS 1000ML (Non-Pharmacy Meds/Solutions) ×2 IMPLANT
SOLUTION IRR POUR BTL H20 1000ML (Non-Pharmacy Meds/Solutions) ×2 IMPLANT
SOLUTION IV 0.9% NS 1000ML (Non-Pharmacy Meds/Solutions) ×4 IMPLANT
SPONGE LAP RF DETECT 18" X 18" XRAY STERILE (Sponges) ×2 IMPLANT
SPONGE SURGIFOAM LARGE (GELFOAM) (Hemostatic agents/wax/sealants-absorbable) ×2
SPONGES BIOSEAL PEANUT IN HOLDER 5/PACK (Sponges) ×2
STRAP POSITIONING KNEE/BODY, HOOK AND LOOP (Misc Surgical Supply) ×2 IMPLANT
SURGICAL PACK SPINAL STAGING (Procedure Packs/kits) ×2
SUTURE MONOCRYL PLUS 3-0 27" PS-2 (MCP427) (Suture) ×4
SUTURE MONOCRYL PLUS 4-0 27" PS-2 MCP426 (Suture) ×12 IMPLANT
SUTURE VICRYL PLUS 0 18" MO-4 CR VCP701D (Suture) ×2 IMPLANT
SUTURE VICRYL PLUS 1 18" MO-4 CR VCP702D (Suture) ×2 IMPLANT
SUTURE VICRYL PLUS 2-0 18" CT-1 (Suture) ×2
SUTURE VICRYL PLUS 2-0 18" CT-1 VCP839D (Suture) ×2 IMPLANT
TOWELS OR BLUE 4-PACK STERILE, DISPOSABLE (Drapes/towels) ×6 IMPLANT
TRAY FOLEY SURESTEP LUBRI-SIL I.C.16FR URIMETER, LF, TEMP SENSING (Lines/Drains) ×2 IMPLANT
WAX BONE 2.5 GRAM HEMOSTATIC (Hemostatic agents/wax/sealants-absorbable) ×8 IMPLANT

## 2019-01-03 NOTE — Plan of Care (Signed)
Problem: Promotion of Perioperative Health and Safety  Goal: Promotion of Health and Safety of the Perioperative Patient  Description  The patient remains safe, receives treatment appropriate to the surgical intervention and patient's physiological needs and is discharged or transferred to the appropriate level of care.    Information below is the current care plan.  Outcome: Progressing  Flowsheets (Taken 01/03/2019 1625)  Patient /Family stated Goal: UTA  Guidelines: PACU  Individualized Interventions/Recommendations #1: Provide pain relief interventions as needed

## 2019-01-03 NOTE — H&P (Signed)
HISTORY & PHYSICAL - INTERVAL ASSESSMENT  Rowe Yablonski  [id number]    Current Medical Status:  Unchanged    Medications / Allergies:  Unchanged    Review of Systems:  Unchanged    Physical Examination:  I have examined the patient today.  Unchanged    Laboratory or Clinical Data:  Unchanged    Modifications of Initial Care Plan:  Unchanged    Jeanmarie Hubert [facility]     01/03/19     7:17 AM

## 2019-01-03 NOTE — Op Note (Addendum)
[facility] Interventional Neurophysiology Service - Spine Procedure Report  -  Patient Name: Haley Barrett, Haley Barrett  Date of Birth: 03-31-1981  Medical Record Number: [id number]  Pre/Post-Operative Diagnosis: Degenerative lumbar spondylolisthesis and lumbar stenosis with retained hardware (S/P prior lumbar surgery) resulting in unrelenting low back pain/radiculopathy/Bsciatica  Date of Procedure: 01-03-2019  Begin/End Record Time: 9:07 to  Surgeon(s): [facility]  Procedure: Stage 1/lateral approach: XLIF L4-5 - FIRST OF TWO UNIQUE PROCEDURES PERFORMED IN [place] WITH TWO DISTINCT SURGERIES ON THE SAME PERSON IN THE SAME DAY  Technologist/Technical documentation: Vincent Peyer  Neurologist: Jannetta Quint, MD  Procedural Notes:  -  PRE-INCISION BASELINE DATA:  -  The surgeon(s) requested surgical neurophysiology in order to protect/identify neural elements at risk during the  operation. After anesthetic induction, sterile electrodes were placed for purposes of multimodal s timulation and recording.  Per the neurologist, baseline finding under anesthesia & intraoperative course by modality were reported to the surgeon  and anesthesiologist and a verbal acknowledgement received (details below):  -  Neuromuscular Junction (NMJ) testing: NMJ testing was explored at the MEDIAN NERVE at baseline.  The reading neurologist provided comment at their discretion and as appropriate, and baseline NMJ responses were  communicated to the surgeon as ADEQUATE for motor element monitoring.  -  Electromyography (EMG): Recording electrodes were placed bilaterally in the following muscles to record free running  audible and visual electromyography (EMG):  Lower Extremity: VASTUS MEDIALIS VASTUS LATERALIS TIBIALIS ANTERIOR GASTROCNEMIUS  ABDUCTOR HALLUCIS ABDUCTOR DIGITI MINIMI  The reading neurologist provided comment at their discretion and as appropriate, and baseline EMG responses were  communicated to the surgeon as ADEQUATE THROUGHOUT. See  post-procedural notes for subsequent procedural  details.  -  Motor Evoked Potentials (MEP): Bite block placement was CONFIRMED. Stimulating electrodes were placed in the  scalp over the primary motor cortex to generate baseline trans-cranial electric MEPs with a centrally facilitated double  train of 2 and 7, an inter-train interval of 20uS, a pulse width of 75uS, and stimulation of 420V on the left & 420V on the  right. An attempt was made to record compound muscle action potentials bilaterally from the musculature noted below:  Upper Extremities: THENAR HYPOTHENAR  Lower Extremities: VASTUS MEDIALIS VASTUS LATERALIS TIBIALIS ANTERIOR GASTROCNEMIUS  ABDUCTOR HALLUCIS ABDUCTOR DIGITI MINIMI  A reliable, reproducible MEP is not always generated from every muscle at baseline in patients due to multiple variables  (individual response to anesthesia, proximal musculature, underlying pathology, etc).  The reading neurologist provided comment at their discretion and as appropriate, and baseline MEP responses were  communicated to the surgeon as ADEQUATE THROUGHOUT. See post-procedural notes for subsequent procedural  details.  -  Somatosensory Evoked Potentials (SEP):  Upper Extremity SEPs: Bilateral MEDIAN nerves were stimulated to generate a cortical SEP. Baseline stimulation  parameters included a repetition rate of 2.79 Hz, pulse duration of , with current intensities of 19mA on the left and  83mA on the right. First cortical negativity (N20) response latency was 20.78mS on the left & 20.10mS on the right, with  amplitudes of 2.63uV on the left & 2.59uV on the right.  Lower Extremity SEPs: Bilateral SAPHENOUS nerves were stimulated to generate a cortical SEP. Baseline stimulation  parameters included a repetition rate of 2.79 Hz, pulse duration of , with current intensities of 60mA on the left and  ##mA on the right. First cortical positivity (P37) response latency was 37.38mS on the left & 37.42mS on the  right, with  amplitudes of 0.59uV on  the left &0.64uV on the right.  The reading neurologist provided comment at their discretion and as appropriate, and baseline SEP responses were  communicated to the surgeon as ADEQUATE THROUGHOUT. See post-procedural notes for subsequent procedural  details.  -  Electroencephalography (EEG): Scalp EEG was recorded from EIGHT channels covering the head. At baseline, per  neurologist, the overall voltage was NORMAL , the tracing was bilaterally SYMMETRICAL , and the frequency spectrum  varied between 4HZ  and 12HZ . Anesthesia was given information on presence of burst-suppression (B/S) in order to  assist in their determination of anesthetic depth; at baseline this was RELATIVE B/S .  The reading neurologist provided comment at their discretion and as appropriate, and this was communicated to  anesthesia/surgeons as appropriate. See post-procedural notes for subsequent details.  -  PROCEDURAL COURSE AND NOTES:  Per the neurologist, the surgeon and/or anesthesiologist was informed of the following post procedural notes and  acknowledged.  -  ANESTHETIC FADE: Anesthetic fade was OBSERVED TO A SMALL DEGREE in this case.  -  NMJ: NMJ demonstrated ADEQUATE motor responsiveness throughout the case. Train of 4 = 4/4@50mA   -  EMG: NO SIGNIFICANT EMG ACTIVITY  -  MEP: NO ADVERSE EVENTS WERE OBSERVED  -  SEP: NO ADVERSE EVENTS WERE OBSERVED  -  EEG: NO ADVERSE EVENTS WERE OBSERVED Anesthesia staff was given data on B/S at multiple points during the  procedure, as requested, in order to assist in their determination of anesthetic depth (the primary purpose for EEG in this  procedure).  -  Please see Professional Report below for neurologist interpretation.  -  PROFESSIONAL INTERPRETATION: (Dr. Jannetta QuintJeffrey Deleon Passe MD)    Baseline Waveforms: (evaluated under conditions of general anesthesia unless otherwise noted):  NMJ Testing: Within normal limits/unremarkable.  Free Run EMG: Within normal  limits/unremarkable.  MEP: Within normal limits/unremarkable in musculature wherein reliable baseline signals generated.  SEP: Within normal limits/unremarkable.  EEG: Within normal limits/unremarkable. Anesthetic depth was evaluated with EEG and discussed with anesthesia as appropriate, this was the primary purpose for EEG testing and considered essential to providing assistance to anesthesia in order to obtain optimal cortically-mediated evoked responses.Marland Kitchen.     Post Procedural Notes:  NMJ Testing: The waveforms were substantively unchanged from baseline. No adverse electrodiagnostic events were encountered during NMJ testing/monitoring.  Free Run EMG: The waveforms were substantively unchanged from baseline. No adverse electrodiagnostic events were encountered during monitoring.  MEP: The waveforms were substantively unchanged from baseline. No adverse electrodiagnostic events were encountered during monitoring.  SEP: The waveforms were substantively unchanged from baseline. No adverse electrodiagnostic events were encountered during monitoring.  EEG: EEG varied with the anesthetic regimen and multiple variables as above; anesthesia was notified as data requested to assist in their determination of anesthetic depth. No adverse electrodiagnostic events were encountered during monitoring.    IMPRESSION: This was a successful multimodal intra-operative study. There was no substantive evidence of intra-operative impairment of neural structures based upon monitoring in the modalities above, & further clinical correlation is recommended.    Perioperative remote real-time monitoring performed for 2 hour(s) & 31 minute(s).          Jannetta QuintJeffrey Norita Meigs MD    Associate Professor of Neurosciences  Director, Interventional Neurophysiology Service  [facility] [employer] [facility] [employer]

## 2019-01-03 NOTE — Anesthesia Procedure Notes (Signed)
Arterial Line Procedure Note  Procedure: Arterial Line Insertion Date & Time: Date & Time: 01/03/2019 9:00 AM   Preparation: Patient was prepped and draped in usual sterile fashion  Indications:multiple ABGs and hemodynamic monitoring  Location: right radial Universal Protocol: Universal Protocol: Verbal consent obtained, written consent obtained, risks and benefits discussed, patient states understanding of the procedure being performed, the patient's understanding of the procedure matches consent given, procedure consent matches procedure scheduled, relevant documents present and verified, test results available and properly labeled, site marked, imaging studies available, required blood products, implants, devices, and special equipment available and Immediately prior to procedure a time out was called to verify the correct patient, procedure, equipment, support staff and site/side marked as required  Consent given by: Consent given by: patient  Patient identity confirmed: Patient identity confirmed by: verbally with patient   Anesthesia:    Anesthetic total:  mL Sedation: patient sedated   Procedure Details: abnormal Allen's test, 20, Seldinger technique used and 1  Needle Gauge: 20  Number of insertion attempts 1  Ultrasound Guided Ultrasound Guided Procedure    Medications Administered at:  01/03/2019 9:00 AM  Post Procedure:      Comments:

## 2019-01-03 NOTE — Brief Op Note (Signed)
BRIEF OPERATIVE NOTE    DATE: 01/03/2019  TIME: 11:23 AM    PREOPERATIVE DIAGNOSIS:   1. Degenerative spondylolisthesis L4-5  2. S/p motion-sparing instrumentation L4-5 in [place]  3. Severe back pain with leg weakness and radiculopathy    POSTOPERATIVE DIAGNOSIS:   Same    PROCEDURE:   1. Far lateral interbody fusion L4-5 with PEEK cage (Nuvasive), formagraft, rhBMP-2  2. Placement of biomechanical vertebral structural device consisting of PEEK cage  3. Interpretation of intraoperative fluoroscopy  4. Interpretation of intraoperative neuromonitoring (Nuva+evokes)    ATTENDING SURGEON: Juana Haralson  ASSISTANTS(s): Fennessy    ANESTHESIA: GETA    FINDINGS: spondylolisthesis, instability, retained instrumentation L4-5    WOUND CLASSIFICATION:Class I (clean)    WOUND CLOSURE STATUS:All layers of surgical incision (deep and superficial) were fully closed.    SPECIMENS: None    Fluids/Blood Products:      IV Fluids: 1L LR    Blood Products: None    EBL: 0    Urine Output: 200cc    COMPLICATIONS: None    DISPOSITION: Stable to stage 2

## 2019-01-03 NOTE — Brief Op Note (Signed)
BRIEF OPERATIVE NOTE    DATE: 01/03/2019  TIME: 3:38 PM    PREOPERATIVE DIAGNOSIS:   1. Degenerative spondylolisthesis L4-5  2. S/p motion-sparing instrumentation L4-5 in [place]  3. Severe back pain with leg weakness and radiculopathy  4. S/p Stage 1 XLIF L4-5    POSTOPERATIVE DIAGNOSIS:   Same    PROCEDURE:   1. Removal of retained instrumentation consisting of screws and rods with hinge device L4-5   2. Revision laminectomies partial L4 and L5 with bilateral foraminotomies L4-5  3. Revision [place] Osteotomy L4-5  4. Decompression of cauda equina and exploration of nerve roots bilateral L5, L4  5. Posterior spinal instrumentation and fusion L4-5 with screws and rods (Depuy Synthes), local bone autograft, DBM (accel), calcium triphosphate (Magnetos)  6. Interpretation of intraoperative fluoroscopy  7. Interpretation of intraoperative neuromonitoring    ATTENDING SURGEON: Warren Lindahl  ASSISTANTS(s): Fennessy    ANESTHESIA: GETA    FINDINGS: extensive scar, wide prior laminectomies    WOUND CLASSIFICATION:Class I (clean)    WOUND CLOSURE STATUS:All layers of surgical incision (deep and superficial) were fully closed.    SPECIMENS: Retained instrumentation to path    Fluids/Blood Products:      IV Fluids: 2L LR, 1L albumin    Blood Products: None    EBL: 150cc    Urine Output: 2300cc    COMPLICATIONS: None    DISPOSITION: Stable to PACU

## 2019-01-04 ENCOUNTER — Other Ambulatory Visit (HOSPITAL_BASED_OUTPATIENT_CLINIC_OR_DEPARTMENT_OTHER): Payer: Self-pay

## 2019-01-04 LAB — HEMOGRAM, BLOOD
Hct: 26.9 % — ABNORMAL LOW (ref 34.0–45.0)
Hgb: 8.8 gm/dL — ABNORMAL LOW (ref 11.2–15.7)
MCH: 30.8 pg (ref 26.0–32.0)
MCHC: 32.7 g/dL (ref 32.0–36.0)
MCV: 94.1 um3 (ref 79.0–95.0)
MPV: 9.4 fL (ref 9.4–12.4)
Plt Count: 152 10*3/uL (ref 140–370)
RBC: 2.86 10*6/uL — ABNORMAL LOW (ref 3.90–5.20)
RDW: 13.1 % (ref 12.0–14.0)
WBC: 9.8 10*3/uL (ref 4.0–10.0)

## 2019-01-04 LAB — BASIC METABOLIC PANEL, BLOOD
Anion Gap: 11 mmol/L (ref 7–15)
BUN: 5 mg/dL — ABNORMAL LOW (ref 6–20)
Bicarbonate: 23 mmol/L (ref 22–29)
Calcium: 8 mg/dL — ABNORMAL LOW (ref 8.5–10.6)
Chloride: 106 mmol/L (ref 98–107)
Creatinine: 0.7 mg/dL (ref 0.51–0.95)
GFR: >60 mL/min
Glucose: 127 mg/dL — ABNORMAL HIGH (ref 70–99)
Potassium: 3.9 mmol/L (ref 3.5–5.1)
Sodium: 140 mmol/L (ref 136–145)

## 2019-01-04 MED ORDER — OXYCODONE HCL ER 10 MG PO T12A
10.0000 mg | EXTENDED_RELEASE_TABLET | Freq: Two times a day (BID) | ORAL | Status: DC
Start: 2019-01-04 — End: 2019-01-05
  Administered 2019-01-04: 10 mg via ORAL
  Filled 2019-01-04: qty 1

## 2019-01-04 MED ORDER — OXYCODONE HCL 5 MG OR TABS
5.0000 mg | ORAL_TABLET | ORAL | Status: DC | PRN
Start: 2019-01-04 — End: 2019-01-05

## 2019-01-04 MED ORDER — SODIUM CHLORIDE 0.9 % IV SOLN
Freq: Once | INTRAVENOUS | Status: AC
Start: 2019-01-04 — End: 2019-01-04
  Administered 2019-01-04: 09:00:00 via INTRAVENOUS

## 2019-01-04 MED ORDER — OXYCODONE HCL 10 MG OR TABS
10.0000 mg | ORAL_TABLET | ORAL | Status: DC | PRN
Start: 2019-01-04 — End: 2019-01-05
  Administered 2019-01-04 (×3): 10 mg via ORAL
  Filled 2019-01-04 (×2): qty 1

## 2019-01-04 MED ORDER — HYDROMORPHONE HCL 1 MG/ML IJ SOLN
1.0000 mg | INTRAMUSCULAR | Status: AC | PRN
Start: 2019-01-04 — End: 2019-01-05
  Administered 2019-01-04: 1 mg via INTRAVENOUS
  Filled 2019-01-04: qty 1

## 2019-01-04 NOTE — Interdisciplinary (Signed)
Procedure: [place] 637 LSO [phone])  brace was left bedside. Brace sizing was made based on visualization of the approximate size of the patient.  (If assistance is needed applying the brace or adjusting the size of the brace, please page the on-call ortho tech. [facility] pager: [phone] Loralie Champagne Pager: [phone]      Location: Spine  Instruction/Education Provided: yes    Ordering Physician:  Dian Queen MD    Breg Product: The patient was assisted and educated on proper fit and usage of the product.  Prior to dispensing product, the patient was informed that their insurance will be billed by the supplying third party vendor Breg and that their insurance may not cover the entire cost of the DME and may be liable for co-pay or deductibles depending on their healthcare plan.  Patient notified that they have the option to purchase the prescribed DME from an outside vendor.

## 2019-01-04 NOTE — Interdisciplinary (Signed)
01/04/19 1352   Provider Notification   Provider Notified Physician   Provider Name 1st page   Method of Communication Text Page   Reason for Communication pt is c/o 10/10 pain, on PCA dilaudid 0.2mg /q10. She's asking if she can be switched to oxy po. She said dilaudid is only making her sleepy but not effective for pain

## 2019-01-04 NOTE — Interdisciplinary (Signed)
01/04/19 1006   Initial Assessment   CM Initial Assessment * Completed   Patient Information   Where was the patient admitted from? * Home   Prior to Level of Function * Ambulatory/Independent with ADL's   Assistive Device * Shower chair   Prior [employer] None   Primary Caretaker(s) * Self   Primary Contact Name, Number and Relationship * Leighton,TOM FATHER   [phone]    Permission to Contact * Yes   Secondary Contact Name, Number and Relationship Fredderick Phenix [phone]    Conservator/Public Guardian Name and [place] Other (Comment)  Firefighter Medical)   Discharge Planning   Living Arrangements * Alone  (Father staying for 2 weeks)   Available Assistance/Support System * Parent   Type of Residence * Apartment  (with elevator access)   [place] * No   Additional Services Not Applicable   Anticipated Discharge Dispostion/Needs Home;Home with Family;Home Health   Patient's Discharge Goal(s) Home;Home Health   Barriers to Discharge * Not Applicable   Do you have difficulty affording your medications No   Patient/Family/Other Engaged in Discharge Planning * Yes   Name, Relationship and Phone Number of Person Engaged in the Discharge Plan Patient   Patient Has Decision Making Capacity * Yes   Patient/Family/Legal/Surrogate Decision Maker Has Been Given a List Options And Choice In The Selection of Post-Acute Care Providers * Yes  (No preference)   CM discussed the following with pt, and/or family, and/or DPOA [facility] has agreements with select post-acute care providers in the collaborative care network;If patient has chosen [facility] or [facility] for post-acute care, they were informed that we partner with, and have a financial interest in these organizations   Legal Designee/Surrogate Name/Relationship/Contact Info na   Family/Caregiver's Assessed for * Readiness,  willingness, and ability to provide or support self-management activities   Respite Care * Not Applicable   Patient/Family/Other Are In Agreement With Discharge Plan * To be determined   [place] Not Applicable   Social Worker Consult   Do you need to see a Education officer, museum? * No   Readmission Risk Assessment   Readmission Within 30 Days of Discharge * No   Recent Hospitalizations (Within Last 6 Months) * No   High Risk For Readmission * No   MOON   MOON Provided to Patient Not Applicable     Chief Complaints:  S/p lumbar fusion 4/28    Case Manager met with patient for initial DC planning.  Confirmed phone number and address on face sheet.  Patient will return to address in face sheet.       CM Assessment:  PLOF: Independent with ADLs and mobility.  Prior Durable Medical Equipment:   shower chair  Prior SNF/ Home Health:  No prior HH/SNF  PCP verified: Ross Marcus  Pharmacy: [id number] [place], [place] - [address]  Transportation to home: father  Barriers to DGG:YIRS   Discharge plan: Home with father, will stay for 2 weeks     Anticipated DC needs:  HH PT/RN.        Referral sent: to [facility]    Expected discharge date: 1-2 days    Case Manager will continue to assess needs for safe transition to home or next level of care.       MLouretta ShortenBSN  RN CRRN  Care Manager

## 2019-01-04 NOTE — Interdisciplinary (Signed)
Physical Therapy Evaluation    Admitting Physician:  Zlomislic, Vinko, MD  Admission Date 01/03/2019    Inpatient Diagnosis:   Problem List       Codes    Spondylolisthesis of lumbar region     ICD-10-CM: M43.16  ICD-9-CM: 738.4    Retained orthopedic hardware     ICD-10-CM: Z96.9  ICD-9-CM: V45.89    Impaired functional mobility, balance, gait, and endurance     ICD-10-CM: Z74.09  ICD-9-CM: V49.89          IP Start of Service   Start of Care: 01/04/19  Onset Date: 01/03/2019  Reason for referral: Decline in functional ability/mobility    Preferred [facility]         Past Medical History:   Diagnosis Date   . ADD (attention deficit disorder)    . Clavicular fracture       Past Surgical History:   Procedure Laterality Date   . BACK SURGERY  07/28/2018    In [place]   . HUMERUS FRACTURE SURGERY Left 1993   . ------------OTHER-------------  38 yo    humerus ORIF        PT Acute     Row Name 01/04/19 1400          Type of Visit    Type of Physical Therapy note  Physical Therapy Evaluation     Row Name 01/04/19 1400          Treatment Precautions/Restrictions    Precautions/Restrictions  Spine;Postsurgical/procedural     Other Precautions/Restrictions Information  Post-op spine precautions, LSO when [place] Name 01/04/19 1400          Medical History    History of presenting condition  POD#1 s/p L4-5 XLIF, removal of posterior instrumentation and PSF     Fall history  No falls reported in the last 6 months     [place] Name 01/04/19 1400          Functional History    Prior Level of Function  Minimal deficits     Equipment required for mobility in the home  None     Other Functional History Information  borrowing a FWW, not using any DME at baseline     Row Name 01/04/19 1400          Social History    Living Situation  Lives alone     [place] accessibility   Accessible with wheelchair or walker 2nd floor, with elevator access     Other Social History Information  pt's father will be  staying with her upon d/c     [place] Name 01/04/19 1400          Subjective    Subjective Information  pt received supine in bed, agreeable to PT evaluation     Patient status  Patient agreeable to treatment;Nursing in agreement for treatment;Patient pain control adequate to participate in therapy     [place] Name 01/04/19 1400          Pain Assessment    Pain Asssessment Tool  Numeric Pain Rating Scale     Row Name 01/04/19 1400          Objective    Overall Cognitive Status  Intact - no cognitive limitations or impairments noted     Communication  No communication limitations or impairments noted. Current status of hearing, speech and vision allow functional communication.  Coordination/Motor control  No limitations or impairments noted. Movement patterns are fluid and coordinated throughout     Balance  Balance limitations present     Static Sitting Balance  Good - able to maintain balance without handhold support, limited postural sway     Dynamic Sitting Balance  Fair - accepts minimal challenge, able to maintain balance while turning head/trunk     Static Standing Balance  Good - able to maintain balance without handhold support, limited postural sway     Dynamic Standing Balance  Not tested     Extremity Assessment  Range of motion, strength,  muscle tone and/or sensation limitations present     LLE findings  hip flexors 2/5, quads 3-/5 (hip/knee strength/ROM limited by post-op pain), ankle strength/ROM WNL     RLE findings  WFL     Functional Mobility  Functional mobility deficits present     Bed Mobility  Minimum assistance (25% assistance)     Bed Mobility Comments  Min A for supine to sit at EOB, cues for log roll technique     Transfers to/from Stand  Minimum assistance (25% assistance)     Transfer Comments  CGA for sit<>stand w/ FWW 3x, cues for safe hand placement.      Device used for [employer]  0     Other Objective Findings  Pt educated on use of  LSO for all OOB activity, instructions given on how to donn brace. Adjustments made to brace for improved fit. Pt requiring Mod A to donn/doff LSO. Pt reporting dizziness and increased pain standing, ambulation deferred.     Vitals:   Seated 120/63, HR 78  Standing 99/57, HR 82 (dizzy)  Supine at end of session: 110/83, HR 76 (dizziness resolved)                 Eval cont.     [place] Name 01/04/19 1400          [place] AM-PAC: Basic Mobility    Assistance Needed to Turn from Back to Side While in a Flat Bed Without Using Bedrails  3 - A little (supervised/min assist)     Difficulty with Supine to Sit Transfer  3 - A little (supervised/min assist)     How Much Help Needed to Move to/from Bed to Chair  3 - A little (supervised/min assist)     Difficulty with Sit to Stand Transfer from Chair with Arms  3 - A little (supervised/min assist)     How Much Help Needed to Walk in Room  3 - A little (supervised/min assist)     How Much Help Needed to Climb 3-5 Steps with a Rail  2 - A lot (mod/max assist)     AMPAC Total Score  17     Assessment: AM-PAC Basic Mobility Impairment Rating  Score 13-18 - 40-59% impaired     Row Name 01/04/19 1400          Patient/Family Education    Learner(s)  Patient     Learner response to rehab patient education interventions  Verbalizes understanding;Able to return demonstrate teaching     Patient/family training comments  Pt educated on role of PT, PT plan of care, spine precautions, use of LSO brace for all OOB activity, benefits of OOB activity, and hospital mobility safety.      [place] Name 01/04/19 1400          Assessment  Assessment  Pt overall CGA for bed mobility and sit<>stand transfers w/ FWW. Pt with fair tolerance for OOB activity, limited by post-op pain, dizziness and orthostatic hypotension. Pt would benefit from ongoing skilled IP PT to maximize independence, activity tolerance, and safety. Anticipate pt will be safe to d/c home w/ supervision and ongoing [place] PT, when medically  stable and after all IP PT goals have been met. DME recs pending progress w/ therapy.      Rehab Potential  Good     Row Name 01/04/19 1400          Goal 1 (Short Term)    Impairment  Education need     Custom goal  Pt will be independent to donn/doff LSO and will be able to recite spine precautions.      Number of visits  1-3     Goal Status  New     Row Name 01/04/19 1400          Goal 2 (Short Term)    Impairment  Functional mobility limitation     Custom goal  Pt will be supervised for bed mobility, using log roll technique     Number of visits  1-3     Goal Status  New     Row Name 01/04/19 1400          Goal 3 (Short Term)    Impairment  Functional mobility limitation     Custom goal  Pt will be supervised for sit<>stand transfers w/ LR AD>      Number of visits  1-3     Goal Status  New     Row Name 01/04/19 1400          Goal 4 (Short Term)    Impairment  Gait impairment     Gait  Patient able to consistently ambulate household distances with least restrictive assistive device with no more than supervision assistance     Number of visits  3-5     Goal Status  New     Row Name 01/04/19 1400          Planned Therapy Interventions and Rationale    Gait Training  to normalize gait pattern and improve safety while ambulating;to normalize gait pattern and improve safety while ambulating with assistive device     Neuromuscular Re-Education  to improve safety during dynamic activities     Therapeutic Activities  to improve functional mobility and ability to navigate in the home and/or community;to improve transfers between surfaces     Theraputic Exercise  to increase strength to allow greater independence with functional mobility skills;to improve activity tolerance to allow greater independence with functional mobility skills     Row Name 01/04/19 1400          Treatment Plan Disussion    Treatment Plan Discussion and Agreement  Patient support system determined and all questions were asked and  answered;Patient/family/caregiver stated understanding and agreement with the therapy plan     Row Name 01/04/19 1400          Treatment Plan    Continue therapy to address  Decline in functional ability/mobility     Frequency of treatment  7 times per week     Duration of treatment (number of visits)  While patient is hospitalized and in need of skilled therapy services     Status of treatment  Patient evaluated and will benefit from ongoing skilled therapy     Row  Name 01/04/19 1400          Patient Safety Considerations    Patient safety considerations  Patient returned to bed at end of treatment;Call light left in reach and fall precautions in place;Patient may be at risk for falls;Nursing notified of safety considerations at end of treatment     Patient assistive device requirements for safe ambulation  [place] Name 01/04/19 1400          Therapy Plan Communication    Therapy Plan Communication  Discussed therapy plan and patient's mobility status with Case Manager;Discussed therapy plan with Nursing and/or Physician     Row Name 01/04/19 1400          Physical Therapy Patient Discharge Instructions    Your Physical Therapist suggests the following  Continue to follow your prescribed mobility precautions when moving in and out of bed and walking  as instructed;Supervision with walking is suggested for increased safety;Continue to use your assistive device as instructed when walking to improve your stability and prevent falls     Row Name 01/04/19 1500          MediCal Evaluation    Medi-Cal Eval Initial 30 minutes (3920)  Completed     Medi-Cal eval 15 min addtl (C1660)            2     Row Name 01/04/19 1400          Treatment Time     Treatment start time  1030     Total TIMED Treatment  (min)  60     Total Treatment Time (min)  60         Post Acute Discharge Recommendations  Discharge Rehabilitation Reccomendations ([place]): If medically appropriate and available, patient demonstrates tolerance  to participate in skilled therapy at the following anticipated level  Therapy level: Home health  Equipment recommendations: To be determined as patient progresses in therapy    The physical therapist of record is endorsed by evaluating physical therapist.

## 2019-01-04 NOTE — Interdisciplinary (Signed)
01/04/19 1815   Provider Notification   Provider Notified Physician   Provider Name 1st page   Method of Communication Text Page   Reason for Communication pt did not get relief from oxy 10mg , asking ifg you can increase ddose? She's hesitant to take IV dilaudid 1mg  IV. Pls advise

## 2019-01-04 NOTE — Interdisciplinary (Signed)
01/04/19 1431   Provider Notification   Provider Notified Physician   Provider Name 1st page   Method of Communication Text Page   Reason for Communication sorry to page again, pt is in extreme pain and crying. Dilaudid PCA ineffective. She took an oxy 5mg  she found from her purse just now. I just have her zanaflex half an hr ago and she said it made her spasm wose, asking if she can have valium more frequent instead (its ordered q12h prn). Pls advise.

## 2019-01-04 NOTE — Progress Notes (Addendum)
Orthopedics Spine Progress Note  01/04/2019    Patient ID:  Name: Haley Barrett  MRN: [id number]  DOB: 11-08-1980    Procedures:  01/03/2019 - L4-5 XLIF, removal of posterior instrumentation and PSF     Subjective:  Doing well, pain is well controlled. Leg pain is much improved. No new weakness or numbness    Objective:  Vital Signs:  BP (!) 87/49   Pulse 54   Temp 97.4 F (36.3 C)   Resp 16   Ht 5\' 10"  (1.778 m)   Wt 78.9 kg (174 lb)   LMP 12/12/2018 (Approximate)   SpO2 100%   BMI 24.97 kg/m     Physical Exam:  General: patient awake, alert, and responding to commands; no apparent distress  Cardio: regular rate and rhythm per pulse  Respiratory: patient breathing comfortably without use of accessory muscles  Neck/Back/Abdomen: dressings in place, clean/dry/intact      Bilateral Lower Extremity   Motor:       Right           Left        Iliopsoas (L2/3)                     5/5           5/5         Quadriceps (L4)                    5/5           5/5        Tibialis Anterior (L4/5)           5/5           5/5        EHL (L5)                               5/5           5/5        GSC (S1)                              5/5           5/5   Sensation:        SILT in L2-S1 distributions.   Special Tests:        Downgoing plantar response. No clonus.    Vascular Exam:        Warm and well perfused distally    Laboratory Data:   Lab Results   Component Value Date    WBC 9.8 01/04/2019    HGB 8.8 (L) 01/04/2019    HCT 26.9 (L) 01/04/2019    PLT 152 01/04/2019     Lab Results   Component Value Date    NA 140 01/04/2019    K 3.9 01/04/2019    CL 106 01/04/2019    BICARB 23 01/04/2019    BUN 5 (L) 01/04/2019    CREAT 0.70 01/04/2019    GLU 127 (H) 01/04/2019    [place] 8.0 (L) 01/04/2019     No results found for: INR, PTT      Input/Output:    Intake/Output Summary (Last 24 hours) at 01/04/2019 0908  Last data filed at 01/04/2019 0704  Gross per 24 hour   Intake 3450 ml   Output 6300 ml   Net -2850  ml       Current  Medications:  Current Facility-Administered Medications   Medication   . acetaminophen (TYLENOL) tablet 975 mg   . ALPRAZolam (XANAX) tablet 0.25 mg   . ascorbic acid (VITAMIN C) tablet 500 mg   . bisacodyl (DULCOLAX) suppository 10 mg   . calcium carbonate (TUMS) chewable tablet 1,000 mg   . diazepam (VALIUM) tablet 5 mg   . diphenhydrAMINE (BENADRYL) tablet 25 mg   . docusate sodium (COLACE) capsule 250 mg   . famotidine (PEPCID) IVPB 20 mg    Or   . famotidine (PEPCID) tablet 20 mg   . gabapentin (NEURONTIN) capsule 300 mg   . HYDROmorphone (DILAUDID) 0.2 mg/mL PCA syringe   . lidocaine (ASPERCREME) 4 % patch 2 patch   . magnesium hydroxide (MILK OF MAGNESIA) suspension 30 mL   . menthol (CEPACOL) lozenge 3 mg   . multivitamin tablet 1 tablet   . ondansetron (ZOFRAN) injection 4 mg   . polyethylene glycol (MIRALAX) packet 17 g   . psyllium (METAMUCIL) 58.12 % packet 1 packet   . senna (SENOKOT) tablet 17.2 mg   . sodium chloride 0.9 % 1,000 mL IV bolus   . sodium chloride 0.9% infusion   . tiZANidine (ZANAFLEX) tablet 4 mg   . zinc sulfate capsule 220 mg (50 mg elemental zinc)   . zolpidem (AMBIEN) tablet 5 mg       Assessment:   38 year old female 1 Day Post-Op s/p above procedures. Patient doing well and progressing appropriately.   - Acute Blood Loss Anemia with component of hemodilution    Plan:   - PT: OOB with LSO brace  - Antibiotics: complete peri-operative   - DVT Prophylaxis: no pharmacologic prophylaxis; SCDs/TED hose, ambulation  - Pain Medication: PCA, transition to PO tomorrow  - Diet: regular  - Post op imaging: ordered and pending  - Drain: none  - Foley: remove per protocol  - Other: aggressive incentive spirometry  - Dispo: pending xrays, PT clearance, pain control on oral pain medication, CM clearance      Ethlyn DanielsJacob Tais Koestner, MD  Ortho Spine Fellow  PID [phone]      Please page the Orthopaedic Spine Surgery Team with questions or concerns based on patient location:     Established spine floor  patients at [facility]: Ortho Spine 1 - [phone]      [facility] - [phone]      New spine consultations not yet followed by spine team:    [facility] on call for "SPINE CONSULT" call schedule (Orthopaedic Spine versus Neurosurgery)   If Orthopaedic Spine is on call please page the PRIMARY CONSULT RESIDENT on call:   [facility] [facility]: ORTHOPEDICS/TH   [facility]: ORTHOPEDICS/HC

## 2019-01-04 NOTE — Discharge Instructions (Addendum)
Surgeon:   Dr. Christa See    Follow-up Appointment:  Your clinic appointment will be:    Future Appointments   Date Time Provider Department Center   01/18/2019 11:00 AM Zlomislic, Alger Memos, MD [facility]       If this appointment was not scheduled while you were in the hospital then our clinic scheduler will call you in the next few days to schedule this appointment. If you do not hear from [place] within 3 days you should call the clinic at [phone] (for [facility]) or [phone] (for [place]). You should see [place] in clinic within 2 weeks from hospital discharge.       Reasons to Contact a Doctor Urgently:     Call 911 or return to the hospital immediately if: If you have chest pain, sudden onset shortness of breath, difficulty breathing, dizziness, lightheadedness, change in mental status or any other concerning issues.    If you develop a fever (>101), redness or drainage from the surgical incision site, swelling or redness of an extremity, profuse bleeding or excessive drainage from incisional site, or other concerning issues please call our office to arrange for an evaluation or go to the emergency department.     If you have any questions about your hospital care, your medications, or if you have new or concerning symptoms soon after going home from the hospital, your hospital physician can be contacted in the following manner:   [facility] operator at [phone].  [facility] Scheduler Phone Number: [phone] [facility]) or [phone] [facility]).    Once you are able to see your primary care physician (PCP), your PCP will then be responsible for further medication refills, or appointment referrals.      General Instructions for After Discharge    Your activity level at home should be:  As tolerated with no bending, twisting, or lifting over 5 lbs. (approximately a gallon of milk).    If you were given instructions to wear a back brace, please wear the brace when out of bed except when  in the shower.    Wound Care/Hygiene:  Please leave your surgical dressing on for 5 days from the date of surgery. No showering during that time. Sponge baths only.     Keep dressing clean and dry at all time.     Prior to discharge, ask your nurse to provide you with extra bandages should the existing one get dirty or fall off.    On day 5, take your dressing off and you may shower. Please pat the wound dry with a clean towel so you do not disturb any of the scab formation. Do not pull at the Dermabond, but leave it in place until it falls off on its own.  Cover the incision with sterile guaze and paper tape and change dressing daily until your first post operative visit.    Please call us if there is redness, swelling or drainage.     If any concerns with showering, then OK to leave all dressings in place and sponge bathe only.    Do not bathe in a tub or swim or Jacuzzi (ie do not submerge the incision) until instructed to do so by your surgeon.    Do not use ointments or creams on the incision.      You may notice some bruising around the incision.  This is not uncommon and should begin to go away within the first 2 weeks after surgery.  Diet:  Your diet at home should be a regular diet.      Medications:    Take medications as prescribed. The short acting narcotic pain medication (usually Oxycodone) can be continued as needed, not to exceed the directions on the prescription (usually up to every 4 hours or 6 pills per day). To help wean off of the pain medications or to supplement your pain control you can use Tylenol to help with pain. If you run out of medication before your follow up visit, please let our clinic know so that we may authorize a refill if indicated. Please keep in mind it may take up to 72 hours for a refill to be authorized.    You may be given a one time prescription for Lidocaine patches. These are usually only covered once by insurance. However, 4% lidocaine patches can be purchased  over the counter if you run out.    Your medication list is located on this After Visit Summary in the Current Discharge Medication List section.  Your nurse will review this information with you before you leave the hospital.    It is very important for you to keep a current medication list with you in order to assist your doctors with your medical care.  Bring this After Visit Summary with you to your follow up appointments.    While you are taking the narcotic pain medication, take the stool softener and drink plenty of water to prevent constipation.    Do not drive while on narcotic medications.    Do not mix narcotic medications with alcohol.       What Needs to Happen Next After Discharge -- Appointments and Follow Up    You should have an office appointment about 2 weeks following your discharge from the hospital.  If you do not please call [phone] ([place]) or [phone] [facility]).     Generally, you should return to see your surgeon at the following intervals, but this may be individualized depending on special circumstances.    Any appointments already scheduled at [facility] clinics will be listed in the Future Appointments section at the top of this After Visit Summary.  Any appointments that have been requested, but have not yet been scheduled, will be listed below that under Post Discharge Referrals.

## 2019-01-04 NOTE — Interdisciplinary (Signed)
OTContact     Row Name 01/04/19 1425       Therapy Contact Note    Contact Time  1425    Therapy not provided at this time as  Patient/family/caregiver declined treatment.    Additional Comments  Pt reports she is in too much pain at this time; expressed frustration with IV pump alarming; RN notified. OT to follow up at later time as schedule permits.

## 2019-01-04 NOTE — Discharge Summary (Signed)
Patient Name:  Haley Barrett    Principal Diagnosis (required):    1. Degenerative spondylolisthesis L4-5  2. S/p motion-sparing instrumentation L4-5 in [place]  3. Severe back pain with leg weakness and radiculopathy    Hospital Problem List (required):    Patient Active Problem List   Diagnosis   . ADD (attention deficit disorder)   . Birth control   . Finger dislocation, subsequent encounter   . Chronic bilateral low back pain, with sciatica presence unspecified   . Lumbar spondylosis   . Lumbar disc disease with radiculopathy   . Degenerative spondylolisthesis   . Spinal stenosis of lumbar region with radiculopathy   . Back pain, unspecified back location, unspecified back pain laterality, unspecified chronicity   . Chronic bilateral low back pain with bilateral sciatica   . Spinal stenosis of lumbar region, unspecified whether neurogenic claudication present   . S/P lumbar fusion   . Gastroesophageal reflux disease without esophagitis   . Spondylolisthesis at L4-L5 level       Additional Hospital Diagnoses ("rule out" or "suspected" diagnoses, etc.):  - Acute Blood Loss Anemia with component of hemodilution            Principal Procedure During This Hospitalization (required):  Far lateral interbody fusion  Removal of retained spinal hardware lumbar 4-5, and posterior instrumented fusion lumbar 4-5      Other Procedures Performed During This Hospitalization (required):  None    Consultations Obtained During This Hospitalization:  PT  OT  CM      Key consultant recommendations:  Therapy regimen  Discharge disposition      Reason for Admission to the Hospital / History of Present Illness:  Per clinic note: 12/07/18  "38 year old female returns to spine clinic for follow up evaluation of complaints of low back pain. She is known to clinic having previously presented for initial evaluation of mobile spondylolisthesis last fall for which we had recommended lumbar fusion. She ultimately decided on a separate  instrumented lumbar procedure being offered in [place], and returns for follow up. She states that she had slight improvement of her symptoms for about a week after surgery, but has since had severe and unrelenting pain. She is unable to stand upright and has pain with any prolonged walking or standing beyond a couple minutes at a time. She has pain at night even when laying flat. She reports low back pain as well as radiating lower extremity pain with paresthesias and feelings of subjective weakness.  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 She states that over the past six months she is unable to stand or walk for any prolonged period of time and has been unable to return to regular activities or working out or volleyball. She denies any focal motor deficits or weakness. Pain is worse with activity and relieved with rest.  Patient rates her pain as 8, largely improved after recent medrol dose pack she had been provided by outside provider.  She has had extensive course of conservative management including time, activity modification, pain medications, PT and injections in addition to chiropractic, massage and acupuncture, in addition to surgery as noted above in [place], without any sustained relief, and with progression. She also has complaints of increasing neck pain with Patient reports no changes in bowel and bladder function.  She is here today to discuss details of her planned surgery."  Hospital Course by Problem (required):  The patient was admitted and taken to the operating room for the planned procedure.  The procedure proceeded uneventfully and without complication - please see operative details for report.  Post-operatively the patient was admitted to the ward for recovery.  The post-operative plan  included the following:  Mechanical DVT prophylaxis  IV and PO pain  medication  Prophylactic antibiotics  Working with PT/OT      On the day of discharge the patient was medically stable, urinating without difficulty, tolerating oral diet, pain well controlled, and was cleared for discharge by PT/OT. The patient was given strict return precautions, wound care instructions, and activity restrictions and demonstrated clear understanding.      Patient will follow-up in 2 weeks in clinic.    Tests Outstanding at Discharge Requiring Follow Up:  None    Discharge Condition (required):  Stable    Key Physical Exam Findings at Discharge:  General: patient awake, alert, and responding to commands; no apparent distress  Cardio: regular rate and rhythm per pulse  Respiratory: patient breathing comfortably without use of accessory muscles  Neck/Back/Abdomen: dressings in place, clean/dry/intact      Bilateral Lower Extremity              Motor:                                         Right           Left                   Iliopsoas (L2/3)                     5/5              5/5                            Quadriceps (L4)                    5/5              5/5                   Tibialis Anterior (L4/5)           5/5             5/5                   EHL (L5)                               5/5              5/5                   GSC (S1)                              5/5              5/5              Sensation:                   SILT in L2-S1 distributions.  Special Tests:                   Downgoing plantar response. No clonus.               Vascular Exam:                   Warm and well perfused distally    Discharge Diet:  regular    Discharge Medications:     What To Do With Your Medications      START taking these medications      Add'l Info   acetaminophen 325 MG tablet  Commonly known as:  TYLENOL  Take 3 tablets (975 mg) by mouth every 8 hours.   Quantity:  90 tablet  Refills:  1     docusate sodium 250 MG capsule  Commonly known as:  COLACE  Take 1 capsule (250 mg) by mouth  at bedtime.   Quantity:  60 capsule  Refills:  0     * oxyCODONE 20 MG Controlled-Release tablet  Commonly known as:  OXYCONTIN  Take 1 tablet (20 mg) by mouth every 12 hours for 2 days.   Quantity:  4 tablet  Refills:  0     * oxyCODONE 5 MG immediate release tablet  Commonly known as:  ROXICODONE  Take 1 tablet (5 mg) by mouth every 4 hours as needed for Moderate Pain (Pain Score 4-6).   Quantity:  60 tablet  Refills:  0     tiZANidine 4 MG tablet  Commonly known as:  ZANAFLEX  Take 1 tablet (4 mg) by mouth every 6 hours as needed (spasm).   Quantity:  40 tablet  Refills:  0         * This list has 2 medication(s) that are the same as other medications prescribed for you. Read the directions carefully, and ask your doctor or other care provider to review them with you.            CHANGE how you take these medications      Add'l Info   gabapentin 300 MG capsule  Commonly known as:  NEURONTIN  Take 1 capsule (300 mg) by mouth 3 times daily.   Quantity:  90 capsule  Refills:  0  What changed:     medication strength   how much to take   how to take this   when to take this   additional instructions        CONTINUE taking these medications      Add'l Info   * amphetamine-dextroamphetamine 10 MG tablet  Commonly known as:  ADDERALL  Take 10 mg by mouth 3 times daily.   Refills:  0     * ADDERALL PO  as needed.   Refills:  0     ALPRAZolam 0.25 MG tablet  Commonly known as:  XANAX  take 1 tablet by mouth once daily if needed   Refills:  0     clindamycin 1 % solution  Commonly known as:  CLEOCIN T  Apply 1 Application topically 2 times daily. Use a small amount as directed   Quantity:  1 bottle  Refills:  2     diazepam 5 MG tablet  Commonly known as:  VALIUM  Take 1 tablet (5 mg) by mouth every 6 hours as needed for Insomnia or Muscle Spasms.   Quantity:  30 tablet  Refills:  0     drospirenone-ethinyl estradiol  3-0.02 MG tablet  Commonly known as:  YAZ  Take 1 tablet by mouth daily.   Quantity:  84  tablet  Refills:  3     zolpidem 5 MG tablet  Commonly known as:  Ambien  Take 1 tablet (5 mg) by mouth nightly as needed for Insomnia.   Quantity:  30 tablet  Refills:  0         * This list has 2 medication(s) that are the same as other medications prescribed for you. Read the directions carefully, and ask your doctor or other care provider to review them with you.            STOP taking these medications    celecoxib 200 MG capsule  Commonly known as:  CELEBREX     cyclobenzaprine 10 MG tablet  Commonly known as:  FLEXERIL     ondansetron 8 MG disintegrating tablet  Commonly known as:  ZOFRAN ODT     oxyCODONE-acetaminophen 5-325 MG tablet  Commonly known as:  PERCOCET     TURMERIC PO           Where to Get Your Medications      These medications were sent to [facility]  [address], [place] [address]    Hours:  Mon-Fri: 8:30am-7:00pm; Sat-Sun & Holidays: 9:00am-5:00pm Phone:  [phone]    acetaminophen 325 MG tablet   docusate sodium 250 MG capsule   gabapentin 300 MG capsule   oxyCODONE 20 MG Controlled-Release tablet   oxyCODONE 5 MG immediate release tablet   tiZANidine 4 MG tablet         Allergies:  Sulfa drugs and Tramadol    Discharge Disposition:  Home with home care services:  home physical therapy.    Discharge Code Status:  Full code / full care  This code status is not changed from the time of admission.    Follow Up Appointments:    Scheduled appointments:  Future Appointments   Date Time Provider Department Center   01/18/2019 11:00 AM Zlomislic, Alger Memos, MD [facility]       For appointments requested for after discharge that have not yet been scheduled, refer to the Post Discharge Referrals section of the After Visit Summary.    Discharging Physician's Contact Information:  [facility] operator at [phone].

## 2019-01-04 NOTE — Op Note (Signed)
[facility] Interventional Neurophysiology Service - Spine Procedure Report  -  Patient Name: Haley Barrett, Haley Barrett  Date of Birth: Mar 24, 1981  Medical Record Number: [id number]  Pre/Post-Operative Diagnosis: Degenerative lumbar spondylolisthesis and lumbar stenosis with retained hardware (S/P prior lumbar surgery) resulting in unrelenting low back pain/radiculopathy/Bsciatica  Date of Procedure: 01-03-2019  Begin/End Record Time: 11:51 to 16:11  Surgeon(s): ZLOMISLIC  Procedure: Stage 2/Posterior Approach: ROI, revision laminectomy, PIF L4-5 - SECOND OF TWO UNIQUE PROCEDURES PERFORMED IN [place] WITH TWO DISTINCT SURGERIES ON THE SAME PERSON IN THE SAME DAY  Technologist/Technical documentation: Vincent Peyer  Neurologist: Jannetta Quint, MD  Procedural Notes:  -  PRE-INCISION BASELINE DATA:  -  The surgeon(s) requested surgical neurophysiology in order to protect/identify neural elements at risk during the  operation. After anesthetic induction, sterile electrodes were placed for purposes of multimodal s timulation and recording.  Per the neurologist, baseline finding under anesthesia & intraoperative course by modality were reported to the surgeon  and anesthesiologist and a verbal acknowledgement received (details below):  -  Neuromuscular Junction (NMJ) testing: NMJ testing was explored at the MEDIAN NERVE at baseline.  The reading neurologist provided comment at their discretion and as appropriate, and baseline NMJ responses were  communicated to the surgeon as ADEQUATE for motor element monitoring.  -  Electromyography (EMG): Recording electrodes were placed bilaterally in the following muscles to record free running  audible and visual electromyography (EMG):  Lower Extremity: VASTUS MEDIALIS VASTUS LATERALIS TIBIALIS ANTERIOR GASTROCNEMIUS  ABDUCTOR HALLUCIS ABDUCTOR DIGITI MINIMI  The reading neurologist provided comment at their discretion and as appropriate, and baseline EMG responses were  communicated to the surgeon as  ADEQUATE THROUGHOUT. See post-procedural notes for subsequent procedural  details.  -  Motor Evoked Potentials (MEP): Bite block placement was CONFIRMED . Stimulating electrodes were placed in the  scalp over the primary motor cortex to generate baseline trans-cranial electric MEPs with a centrally facilitated double  train of 2 and 7, an inter-train interval of 20uS, a pulse width of 75uS, and stimulation of 420V on the left & 420V on the  right. An attempt was made to record compound muscle action potentials bilaterally from the musculature noted below:  Upper Extremities: THENAR HYPOTHENAR  Lower Extremities: VASTUS MEDIALIS VASTUS LATERALIS TIBIALIS ANTERIOR GASTROCNEMIUS  ABDUCTOR HALLUCIS ABDUCTOR DIGITI MINIMI  A reliable, reproducible MEP is not always generated from every muscle at baseline in patients due to multiple variables  (individual response to anesthesia, proximal musculature, underlying pathology, etc).  The reading neurologist provided comment at their discretion and as appropriate, and baseline MEP responses were  communicated to the surgeon as ADEQUATE THROUGHOUT. See post-procedural notes for subsequent procedural  details.  -  Somatosensory Evoked Potentials (SEP):  Upper Extremity SEPs: Bilateral MEDIAN nerves were stimulated to generate a cortical SEP. Baseline stimulation  parameters included a repetition rate of 2.79 Hz, pulse duration of , with current intensities of 55mA on the left and  60mA on the right. First cortical negativity (N20) response latency was 20.18mS on the left & 20.64mS on the right, with  amplitudes of 2.01uV on the left & 2.31uV on the right.  Lower Extremity SEPs: Bilateral POSTERIOR TIBIAL nerves were stimulated to generate a cortical SEP. Baseline  stimulation parameters included a repetition rate of 2.79 Hz, pulse duration of , with current intensities of 19mA on  the left and 51mA on the right. First cortical positivity (P37) response latency was 43.57mS  on the left & 44.66mS on the  right, with amplitudes of 0.71uV on the left & 0.73uV on the right.  The reading neurologist provided comment at their discretion and as appropriate, and baseline SEP responses were  communicated to the surgeon as ADEQUATE THROUGHOUT. See post-procedural notes for subsequent procedural  details.  -  Electroencephalography (EEG): Scalp EEG was recorded from EIGHT channels covering the head. At baseline, per  neurologist, the overall voltage was NORMAL , the tracing was bilaterally SYMMETRICAL , and the frequency spectrum  varied between 4HZ  and 12HZ . Anesthesia was given information on presence of burst-suppression (B/S) in order to  assist in their determination of anesthetic depth; at baseline this was RELATIVE B/S .  The reading neurologist provided comment at their discretion and as appropriate, and this was communicated to  anesthesia/surgeons as appropriate. See post-procedural notes for subsequent details.  -  PROCEDURAL COURSE AND NOTES:  Per the neurologist, the surgeon and/or anesthesiologist was informed of the following post procedural notes and  acknowledged.  -  ANESTHETIC FADE: Anesthetic fade was OBSERVED TO A SMALL DEGREE in this case.  -  NMJ: NMJ demonstrated ADEQUATE motor responsiveness throughout the case. Train of 4 = 4/4@50mA   -  EMG: NO SIGNIFICANT EMG ACTIVITY  -  MEP: NO ADVERSE EVENTS WERE OBSERVED  -  SEP: NO ADVERSE EVENTS WERE OBSERVED  -  EEG: NO ADVERSE EVENTS WERE OBSERVED Anesthesia staff was given data on B/S at multiple points during the  procedure, as requested, in order to assist in their determination of anesthetic depth (the primary purpose for EEG in this  procedure).  -  Please see Professional Report below for neurologist interpretation.  -  PROFESSIONAL INTERPRETATION: (Dr. Jannetta QuintJeffrey Flonnie Wierman MD)    Baseline Waveforms: (evaluated under conditions of general anesthesia unless otherwise noted):  NMJ Testing: Within normal limits/unremarkable.  Free  Run EMG: Within normal limits/unremarkable.  MEP: Within normal limits/unremarkable in musculature wherein reliable baseline signals generated.  SEP: Within normal limits/unremarkable.  EEG: Within normal limits/unremarkable. Anesthetic depth was evaluated with EEG and discussed with anesthesia as appropriate, this was the primary purpose for EEG testing and considered essential to providing assistance to anesthesia in order to obtain optimal cortically-mediated evoked responses.     Post Procedural Notes:  NMJ Testing: The waveforms were substantively unchanged from baseline. No adverse electrodiagnostic events were encountered during NMJ testing/monitoring.  Free Run EMG: The waveforms were substantively unchanged from baseline. No adverse electrodiagnostic events were encountered during monitoring.  MEP: The waveforms were substantively unchanged from baseline. No adverse electrodiagnostic events were encountered during monitoring.  SEP: The waveforms were substantively unchanged from baseline. No adverse electrodiagnostic events were encountered during monitoring.  EEG: EEG varied with the anesthetic regimen and multiple variables as above; anesthesia was notified as data requested to assist in their determination of anesthetic depth. No adverse electrodiagnostic events were encountered during monitoring.    IMPRESSION: This was a successful multimodal intra-operative study. There was no substantive evidence of intra-operative impairment of neural structures based upon monitoring in the modalities above, & further clinical correlation is recommended.    Perioperative remote real-time monitoring dedicated personally to this patient alone performed for 1 hour(s) & 35 minute(s).    In-room monitoring dedicated personally to this patient alone performed for  38  minute(s).        Jannetta QuintJeffrey Shamiracle Gorden MD    Associate Professor of Neurosciences  Director, Interventional Neurophysiology Service  [facility] [employer] [facility]

## 2019-01-04 NOTE — Interdisciplinary (Signed)
OTContact     Row Name 01/04/19 0955       Therapy Contact Note    Contact Time  0955    Therapy not provided at this time as  Bracing/helmet required for therapy not available.    Additional Comments  Pt requires LSO for OOB acts; LSO not present at this time. OT to follow up at later time as bracing is present.

## 2019-01-05 ENCOUNTER — Inpatient Hospital Stay (HOSPITAL_COMMUNITY): Payer: Medicaid Other

## 2019-01-05 DIAGNOSIS — Z981 Arthrodesis status: Secondary | ICD-10-CM

## 2019-01-05 DIAGNOSIS — Z09 Encounter for follow-up examination after completed treatment for conditions other than malignant neoplasm: Secondary | ICD-10-CM

## 2019-01-05 LAB — HEMOGRAM, BLOOD
Hct: 26.3 % — ABNORMAL LOW (ref 34.0–45.0)
Hgb: 8.6 gm/dL — ABNORMAL LOW (ref 11.2–15.7)
MCH: 30.8 pg (ref 26.0–32.0)
MCHC: 32.7 g/dL (ref 32.0–36.0)
MCV: 94.3 um3 (ref 79.0–95.0)
MPV: 9.7 fL (ref 9.4–12.4)
Plt Count: 148 10*3/uL (ref 140–370)
RBC: 2.79 10*6/uL — ABNORMAL LOW (ref 3.90–5.20)
RDW: 12.9 % (ref 12.0–14.0)
WBC: 9.9 10*3/uL (ref 4.0–10.0)

## 2019-01-05 MED ORDER — OXYCODONE HCL 5 MG OR TABS
5.0000 mg | ORAL_TABLET | ORAL | Status: DC | PRN
Start: 2019-01-05 — End: 2019-01-07
  Administered 2019-01-06 – 2019-01-07 (×5): 5 mg via ORAL
  Filled 2019-01-05 (×4): qty 1

## 2019-01-05 MED ORDER — KETOROLAC TROMETHAMINE 15 MG/ML IJ SOLN
15.0000 mg | Freq: Four times a day (QID) | INTRAMUSCULAR | Status: DC
Start: 2019-01-05 — End: 2019-01-05
  Administered 2019-01-05 (×3): 15 mg via INTRAVENOUS
  Filled 2019-01-05 (×3): qty 1

## 2019-01-05 MED ORDER — KETOROLAC TROMETHAMINE 15 MG/ML IJ SOLN
15.0000 mg | Freq: Four times a day (QID) | INTRAMUSCULAR | Status: DC
Start: 2019-01-06 — End: 2019-01-07
  Administered 2019-01-05 – 2019-01-07 (×7): 15 mg via INTRAMUSCULAR
  Filled 2019-01-05 (×6): qty 1

## 2019-01-05 MED ORDER — OXYCODONE HCL ER 20 MG PO T12A
20.0000 mg | EXTENDED_RELEASE_TABLET | Freq: Two times a day (BID) | ORAL | Status: DC
Start: 2019-01-05 — End: 2019-01-07
  Administered 2019-01-05 – 2019-01-07 (×5): 20 mg via ORAL
  Filled 2019-01-05 (×5): qty 1

## 2019-01-05 MED ORDER — OXYCODONE HCL 10 MG OR TABS
10.0000 mg | ORAL_TABLET | ORAL | Status: DC | PRN
Start: 2019-01-05 — End: 2019-01-07
  Administered 2019-01-05 – 2019-01-07 (×11): 10 mg via ORAL
  Filled 2019-01-05 (×11): qty 1

## 2019-01-05 NOTE — Progress Notes (Signed)
Orthopedics Spine Progress Note  01/05/2019    Patient ID:  Name: Haley Barrett  MRN: [id number]  DOB: 1980-12-04    Procedures:  01/03/2019 - L4-5 XLIF, removal of posterior instrumentation and PSF     Subjective:  Pain moderately controlled. No leg pain. No new weakness or numbness    Objective:  Vital Signs:  BP 103/62 (BP Location: Right arm, BP Patient Position: Supine)   Pulse 74   Temp 98.5 F (36.9 C)   Resp 16   Ht 5\' 10"  (1.778 m)   Wt 78.9 kg (174 lb)   LMP 12/12/2018 (Approximate)   SpO2 100%   BMI 24.97 kg/m     Physical Exam:  General: patient awake, alert, and responding to commands; no apparent distress  Cardio: regular rate and rhythm per pulse  Respiratory: patient breathing comfortably without use of accessory muscles  Neck/Back/Abdomen: dressings in place, clean/dry/intact      Bilateral Lower Extremity   Motor:       Right           Left        Iliopsoas (L2/3)                     5/5           5/5         Quadriceps (L4)                    5/5           5/5        Tibialis Anterior (L4/5)           5/5           5/5        EHL (L5)                               5/5           5/5        GSC (S1)                              5/5           5/5   Sensation:        SILT in L2-S1 distributions.   Special Tests:        Downgoing plantar response. No clonus.    Vascular Exam:        Warm and well perfused distally    Laboratory Data:   Lab Results   Component Value Date    WBC 9.9 01/05/2019    HGB 8.6 (L) 01/05/2019    HCT 26.3 (L) 01/05/2019    PLT 148 01/05/2019     Lab Results   Component Value Date    NA 140 01/04/2019    K 3.9 01/04/2019    CL 106 01/04/2019    BICARB 23 01/04/2019    BUN 5 (L) 01/04/2019    CREAT 0.70 01/04/2019    GLU 127 (H) 01/04/2019    [place] 8.0 (L) 01/04/2019     No results found for: INR, PTT      Input/Output:    Intake/Output Summary (Last 24 hours) at 01/05/2019 0834  Last data filed at 01/05/2019 0559  Gross per 24 hour   Intake 3140 ml   Output 4950 ml  Net -1810 ml        Current Medications:  Current Facility-Administered Medications   Medication   . acetaminophen (TYLENOL) tablet 975 mg   . ALPRAZolam (XANAX) tablet 0.25 mg   . ascorbic acid (VITAMIN C) tablet 500 mg   . bisacodyl (DULCOLAX) suppository 10 mg   . calcium carbonate (TUMS) chewable tablet 1,000 mg   . diazepam (VALIUM) tablet 5 mg   . diphenhydrAMINE (BENADRYL) tablet 25 mg   . docusate sodium (COLACE) capsule 250 mg   . famotidine (PEPCID) IVPB 20 mg    Or   . famotidine (PEPCID) tablet 20 mg   . gabapentin (NEURONTIN) capsule 300 mg   . HYDROmorphone (DILAUDID) injection 1 mg   . ketorolac (TORADOL) injection 15 mg   . lidocaine (ASPERCREME) 4 % patch 2 patch   . magnesium hydroxide (MILK OF MAGNESIA) suspension 30 mL   . menthol (CEPACOL) lozenge 3 mg   . multivitamin tablet 1 tablet   . ondansetron (ZOFRAN) injection 4 mg   . oxyCODONE (OXYCONTIN) ER tablet 20 mg   . oxyCODONE (ROXICODONE) tablet 10 mg   . oxyCODONE (ROXICODONE) tablet 5 mg   . polyethylene glycol (MIRALAX) packet 17 g   . psyllium (METAMUCIL) 58.12 % packet 1 packet   . senna (SENOKOT) tablet 17.2 mg   . sodium chloride 0.9% infusion   . tiZANidine (ZANAFLEX) tablet 4 mg   . zinc sulfate capsule 220 mg (50 mg elemental zinc)   . zolpidem (AMBIEN) tablet 5 mg       Assessment:   38 year old female 2 Days Post-Op s/p above procedures. Patient doing well and progressing appropriately.   - Acute Blood Loss Anemia with component of hemodilution    Plan:   - PT: OOB with LSO brace  - Antibiotics: complete peri-operative   - DVT Prophylaxis: no pharmacologic prophylaxis; SCDs/TED hose, ambulation  - Pain Medication: PO with IVBT  - Diet: regular  - Post op imaging: ordered and pending  - Drain: none  - Other: aggressive incentive spirometry  - Dispo: pending xrays, PT clearance, pain control on oral pain medication, CM clearance      Ethlyn DanielsJacob Maclovio Henson, MD  Ortho Spine Fellow  PID [phone]      Please page the Orthopaedic Spine Surgery Team with  questions or concerns based on patient location:     Established spine floor patients at [facility]: Ortho Spine 1 - [phone]      [facility] - [phone]      New spine consultations not yet followed by spine team:    [facility] on call for "SPINE CONSULT" call schedule (Orthopaedic Spine versus Neurosurgery)   If Orthopaedic Spine is on call please page the PRIMARY CONSULT RESIDENT on call:   : ORTHOPEDICS/TH   [place]: ORTHOPEDICS/HC

## 2019-01-05 NOTE — Plan of Care (Signed)
Problem: Promotion of Health and Safety  Goal: Promotion of Health and Safety  Description  The patient remains safe, receives appropriate treatment and achieves optimal outcomes (physically, psychosocially, and spiritually) within the limitations of the disease process by discharge.    Information below is the current care plan.  Flowsheets  Taken 01/04/2019 2000  Patient /Family stated Goal: sleep  Taken 01/05/2019 0223  Individualized Interventions/Recommendations #1: Assess for pain using Numeric scale.  Individualized Interventions/Recommendations #2 (if applicable): Provide restful environment.  Outcome Evaluation (rationale for progressing/not progressing) every shift: Patient resting in her room. Tearful at times. PRN pain med given as ordered.

## 2019-01-05 NOTE — Op Note (Signed)
DATE OF SERVICE:  01/03/2019    PREOPERATIVE DIAGNOSIS:    1.  Degenerative spondylolisthesis L4-L5.  2.  Status post motion sparing instrumentation L4-L5 in [place].  3.  Severe low back pain with leg weakness and radiculopathy.    POSTOPERATIVE DIAGNOSIS:    1.  Degenerative spondylolisthesis L4-L5.  2.  Status post motion sparing instrumentation L4-L5 in [place].  3.  Severe low back pain with leg weakness and radiculopathy.    PROCEDURE PERFORMED:    1.  Far lateral interbody fusion L4-L5 with PEEK cage (NuVasive),  Formagraft, rhBMP-2.   2.  Placement of biomechanical vertebral structural device consisting  of polyetheretherketone cage.   3.  Interpretation of intraoperative fluoroscopy.  4.  Interpretation of intraoperative neuromonitoring (NuVasive and  evoked potentials).     SURGEON/STAFF:  Jolean Madariaga, MD    ASSISTANTS:  Sissy Hoff, MD.  Note, there was no qualified  orthopedic surgery resident available to assist with this case.     ANESTHESIA:  General.    FINDINGS:  Spondylolisthesis, instability, retained instrumentation  L4-L5.     WOUND CLASSIFICATION:  Class 1.    WOUND CLOSURE STATUS:  All layers closed.    SPECIMENS:  None.    IV FLUIDS:  1 L LR.    BLOOD PRODUCTS:  None.    ESTIMATED BLOOD LOSS:  Zero.    URINE OUTPUT:  200 cc.    COMPLICATIONS:  None.    INDICATIONS:  Patient is a 38 year old female who has a longstanding  history of worsening low back pain with radiating bilateral lower  extremity pain, with numbness and weakness.  She had initially  presented [facility] months [facility], for which she was  evaluated extensively, and after failed conservative management  including time, activity modification, pain medications, physical  therapy, and injections, she was interested in definitive surgical  address.  We explained to her at that time, given mobile near grade 2  spondylolisthesis, indications would include lumbar interbody fusion  for stabilization, which we had  planned for in minimally invasive  fashion with far lateral interbody fusion L4-L5, with plan for  posterior spinal instrumentation L4-L5.  Patient, however, decided  that she was not interested in a fusion surgery and pursued other  options on her own.  Ultimately, she traveled to [place], where she  underwent placement of a mobile sparing device posteriorly from  L4-L5, during which process outside providers had resected in  totality the L4-L5 facets as well as spinous process and posterior  ligaments.  She returned following surgery, and within several weeks,  developed marked progression and worsening of her low back pain, with  feelings of instability, in spite of extensive conservative  management, including bracing as well as therapy.  Imaging  demonstrated evidence of hypermobile segment at L4-L5 with retained  instrumentation, with resection of posterior elements as described.  We discussed options with her in extensive detail, and explained to  her that at this point the only remaining option was to proceed with  the original plan for fusion surgery.  We did explain the caveat  being that because of the over resection of bony and ligamentous  elements by outside surgeons, the L4-L5 segment was not the only  tissue, and it had compromised the proximal junction extending to the  L3 level.  We discussed the options, which at this point again would  include revision to fusion at L4-L5, with possibility of extending  fusion to L3.  Given her young age and integrity of the disk at the  L3-L4 level, decision at this point was made not to extend fusion and  to focus only at the L4-L5 level for revision as described, with far  lateral interbody fusion L4-L5, followed by removal of retained  instrumentation, revision laminectomies, and revision posterior  spinal instrumentation and fusion L4-L5.  We have explained to her  that on account of prior procedure, she is at increased risk of  iatrogenic breakdown and  degeneration at the cephalad level at L3-L4,  and she is aware of this.  We discussed the planned procedure as well  as risks, benefits, and alternatives.  Risks include, but are not  limited to, infection, bleeding, damage to nerves and vessels, dural  tear, ongoing back pain as well as leg pain and/or weakness and/or  numbness and paresthesias, hip flexor and/or quadriceps pain,  weakness, numbness, paresthesias, pseudarthrosis or nonunion,  hardware complications or failure, additional procedures, as well as  the risk of general anesthesia including but not limited to DVT, PE,  stroke, myocardial infarction, loss of life.  She expressed  understanding and willingness to proceed in the form of a signed  consent.     PROCEDURE NARRATIVE:  Patient was met in the preoperative holding  area, where we again reviewed the planned procedure with her and  answered all of her questions.  Operative site was marked, and she  was subsequently taken to the operating theater, where she succumbed  to general endotracheal anesthesia, and was placed in the right  lateral decubitus position with left side up, with all of her  extremities and bony prominences appropriately padded and protected  with an axillary roll in place.  She was secured to the table with  tape straps.  Neuromonitoring needles were placed.  She received  appropriate preoperative antibiotic prophylaxis.  Preoperative  fluoroscopic images were obtained for level confirmation and  incisional planning.  The area over the left flank was then prepped  and draped in the usual sterile fashion.  Prior to proceeding, we  performed a formal preoperative verification and time-out procedure  in which we confirmed patient identity by name, number, and birth  date, as well as the planned procedure and availability of all  necessary equipment.     A 15-blade scalpel was used to make a 1 cm incision in the left  posterolateral flank region just lateral to the paraspinal  muscles,  and this was carried down through superficial layers using  electrocautery, maintaining excellent hemostasis.  We identified the  fascia.  We then access the retroperitoneal space, which was  developed with gentle blunt finger dissection.  At this point, a 2 cm  transverse incision was made in line with the L4-L5 disk space  directly in the left flank, and this was carried down through  superficial layers using electrocautery, maintaining excellent  hemostasis.  We again identified the oblique muscle fascia, which was  incised, and the fibers of the oblique muscle layers were then  bluntly separated, following which we traversed the transversalis  fascia and accessed the previously developed retroperitoneal space.  We then sequentially placed dilators onto the psoas muscle, and  tubular retractor was positioned to confirm integrity of our planned  approach, with presence of psoas muscle within the field.  Innermost  dilator was then attached to neuromonitoring and under fluoroscopic  and neuromonitoring guidance, we traversed the psoas muscle,  accessing the lateral aspect of the L4-L5  disk space.  There was  significant spondylolisthesis at this level, and care was taken to  dock the dilator at the appropriate position, again under  fluoroscopic guidance.  After this was confirmed, a guide pin was  placed to secure position, and we sequentially dilated up with  neuromonitoring guidance, and tubular retractor was then positioned  again under neuromonitoring guidance and attached to rigid table side  arm.  AP and lateral fluoroscopic imaging was obtained, demonstrating  appropriate positioning of the retractor with regard to the disk  space.     At this point, we then proceeded with diskectomy in standard fashion  with annulotomy knife, followed by Cobb elevators to develop the  cartilaginous endplates and release the contralateral annulus.  Pituitary and Kerrison rongeurs in addition to ring  curettes,  endplate scrapers, and rasps were used to complete diskectomy in  meticulous fashion.  After this was completed, we placed blunt paddle  rotators, starting at 8 mm and sizing up to 12 mm in height.  There  was obvious mobility, not only in the sagittal, but also coronal and  axial planes, given the high degree of motion within the retained  device, and indicative of hypermobility at this level.  Ultimately,  we placed a 10-degree lordotic 8 x 18 x 50 mm trial in position,  following which we obtained AP and lateral fluoroscopic imaging,  again demonstrating appropriate positioning.  We then sized up to a  15-degree lordotic 12 x 22 x 50 mm trial, which had improved  interference fit, and we again confirmed appropriate positioning on  fluoroscopic imaging, demonstrating excellent placement.  We then  proceeded with preparation of the definitive PEEK cage (NuVasive),  which was prepared with Formagraft and rhBMP-2.  We completed  endplate preparation, and the cage was then inserted under direct  visual as well as fluoroscopic guidance and tamped into position with  excellent interference fit.  Bone wax was placed over the lateral  aspect of the cage.  Hemostatic agents were placed.  Tubular  retractor was then removed under direct visual guidance, confirming  excellent hemostasis as well as integrity of all tissue planes.  AP  and lateral fluoroscopic imaging was obtained, demonstrating  excellent positioning of the interbody cage, with some slight  reduction of spondylolisthesis.     At this point, we then proceeded with closure.  Both incisions were  similarly irrigated and reapproximated.  Vancomycin powder was  placed.  Fascia was reapproximated with #1 Vicryl in figure-of-eight  fashion.  Superficial layers were closed in layered fashion with 0  and 2-0 Vicryl.  Skin was closed with 4-0 Monocryl in a running  subcuticular fashion.  Dermabond was placed as a skin sealant.  Sterile dressings were then  placed.     DISPOSITION:  Patient tolerated the first stage of surgery without  difficulty and is deemed appropriate to proceed with completion of  second stage of surgery, which will be dictated under a separate  heading.     There were no neuromonitoring issues throughout this case.      Job #:  [id number]

## 2019-01-05 NOTE — Plan of Care (Signed)
Problem: Promotion of Health and Safety  Goal: Promotion of Health and Safety  Description  The patient remains safe, receives appropriate treatment and achieves optimal outcomes (physically, psychosocially, and spiritually) within the limitations of the disease process by discharge.    Information below is the current care plan.  Flowsheets  Taken 01/05/2019 0800  Patient /Family stated Goal: pain control  Taken 01/05/2019 1617  Guidelines: Inpatient Nursing Guidelines  Individualized Interventions/Recommendations #1: Pain assessment done and patient medicated with po pain medication as needed.  Individualized Interventions/Recommendations #2 (if applicable): Patient assisted to the bathroom as needed.  Individualized Interventions/Recommendations #3 (if applicable): Vitals, intak and out put monitored. monitored.  Outcome Evaluation (rationale for progressing/not progressing) every shift: Patient remained alert and oriented, pain is controlled with po pain medication with some result, up to the bathroom and up in the hallway with physical therapy, she is voiding well but not BM yet at this time, posterior back dressing remained clean and dry, she is rolerating regular diet well, she is under no acute distress at this time.

## 2019-01-05 NOTE — Interdisciplinary (Signed)
Physical Therapy Daily Treatment Note    Admitting Physician:  Zlomislic, Vinko, MD  Admission Date 01/03/2019    Inpatient Diagnosis:   Problem List       Codes    Spondylolisthesis of lumbar region     ICD-10-CM: M43.16  ICD-9-CM: 738.4    Retained orthopedic hardware     ICD-10-CM: Z96.9  ICD-9-CM: V45.89    Impaired functional mobility, balance, gait, and endurance     ICD-10-CM: Z74.09  ICD-9-CM: V49.89    Decreased independence with activities of daily living     ICD-10-CM: Z65.8  ICD-9-CM: V62.89          IP Start of Service   Start of Care: 01/04/19  Onset Date: 01/03/2019  Reason for referral: Decline in functional ability/mobility    Preferred Language:English         Past Medical History:   Diagnosis Date   . ADD (attention deficit disorder)    . Clavicular fracture       Past Surgical History:   Procedure Laterality Date   . BACK SURG[place]  07[place]28/2018    [place]   . HUMERUS FRACTURE SURGERY Left 1993   . ------------OTHER-------------  38 yo    humerus ORIF        PT Acute     Row Name 01/05/19 1600          Type of Visit    Type of Physical Therapy note  Physical Therapy Daily Treatment Note     Row Name 01/05/19 1600          Treatment Precautions/Restrictions    Precautions/Restrictions  Spine;Postsurgical/procedural     Other Precautions/Restrictions Information  Post-op spine precautions, LSO when OOB      Row Name 01/05/19 1600          Medical History    History of presenting condition  POD#2 s/p L4-5 XLIF, removal of posterior instrumentation and PSF     Row Name 01/05/19 1600          Subjective    Subjective Information  Pt received supine in bed. "Sorry, I'm a little jittery."     Patient status  Patient agreeable to treatment;Nursing in agreement for treatment;Patient pain control adequate to participate in therapy     Row Name 01/05/19 1600          Pain Assessment    Pain Asssessment Tool  Numeric Pain Rating Scale     Row Name 01/05/19 1600          Numeric Pain Rating Scale    Pain  Intensity - rating at present  3     Pain Intensity- rating after treatment  -- Rating not provided despite guidance     Location  Back - pt's gel packs placed in freezer as all were thawed; two ice packs provided for immediate relief     Row Name 01/05/19 1600          Objective    Functional Mobility  Functional mobility deficits present     Bed Mobility  Supervised     Bed Mobility Comments  Sup supine <> sit with HOB flat and use of bedrails. Pt with good return demo of logroll to R. Pt insists on OOB to R. "They cut me up pretty bad on my left side."     Transfers to/from Stand  Supervised     Transfer Comments  Sup STS from low bed and BSC over toilet with UE  assist at [facility]. Pt with B UEs on FWW, but demonstrates good control. PT cautions pt on tipping of FWW.     Gait  Other (comments) SBA     Gait Comments  SBA for ambulation 32ft x 2 with FWW. Pt with decreased stride length and gait speed. VCs for upright posture and gaze. Pt educated on proper use of FWW including smooth progression, body placement and sequencing of pivot. Pt complains of increased pain/pinching with ambulation and requests return to room. Pt reports feeling "woozy," however, BP stable upon return from walk.     Device used for [employer]  47ft x 2     Other Objective Findings Pt able to don LSO with Sup/VCs on set up. "That's the back right?"    Pt with request to use bathroom with Sup. VCs for sequencing using FWW. Pt able to don/doff underwear and perform pericare with unilateral UE support at [facility]. Pt reports she has not yet had a BM.    Prolonged discussion with pt regarding d/c, including: concerns on ability to shower at home (does not want father to help with this), use of AD vs no AD, transition to [facility] PT, importance of performing daily tasks for increased functional mobility, level of pain considered "tolerable," and overall healing process. All questions answered and concerns  addressed.    Pt with extensive requests for items in/outside of room. PT obliges. Pt left in supine positioned to comfort with all needs in reach. RN informed.               Eval cont.     Row Name 01/05/19 1600          Patient/Family Education    Learner(s)  Patient     Learner response to rehab patient education interventions  Verbalizes understanding;Able to return demonstrate teaching;Needs reinforcement     Patient/family training comments  PT purpose and POC, proper use of FWW, importance of mobility including OOB to chair all meals and ambulation for improved strength, activity tolerance, GI mobility (pt has not yet had BM) and healing     Row Name 01/05/19 1600          Assessment    Assessment Pt with improved tolerance and performance this treatment vs prior, demonstrating bed mobility, transfers, balance and gait 85ft x 2 with Sup to SBA using FWW. Pt continues to demonstrate decreased LE strength and activity tolerance, mobility limited by pain and dizziness, as well as altered balance and gait mechanics affecting independence and safety. Pt will benefit from conitnued skilled IP PT to address impairments. Rec continued mobilization while in house, including ambulation and OOB to chair with sup/asst from nursing/staff and FWW.    Rec: Once medically cleared, anticipate safe d/c home w/ assist/sup from father as needed, [facility] PT for maximized functional mobility, and FWW for safe ambulation. Pt may d/c home with no DME pending progress with IP PT.     Rehab Potential  Good     Row Name 01/05/19 1600          Planned Therapy Interventions and Rationale    Gait Training  to normalize gait pattern and improve safety while ambulating;to normalize gait pattern and improve safety while ambulating with assistive device     Neuromuscular Re-Education  to improve safety during dynamic activities     Therapeutic Activities  to improve functional mobility and ability to navigate in the  home and/or community;to  improve transfers between surfaces     Theraputic Exercise  to increase strength to allow greater independence with functional mobility skills;to improve activity tolerance to allow greater independence with functional mobility skills     Row Name 01/05/19 1600          Treatment Plan Disussion    Treatment Plan Discussion and Agreement  Patient support system determined and all questions were asked and answered;Patient/family/caregiver stated understanding and agreement with the therapy plan     Row Name 01/05/19 1600          Treatment Plan    Continue therapy to address  Decline in functional ability/mobility     Frequency of treatment  7 times per week     Duration of treatment (number of visits)  While patient is hospitalized and in need of skilled therapy services     Status of treatment  Patient evaluated and will benefit from ongoing skilled therapy     Row Name 01/05/19 1600          Patient Safety Considerations    Patient safety considerations  Patient returned to bed at end of treatment;Call light left in reach and fall precautions in place;Patient may be at risk for falls;Nursing notified of safety considerations at end of treatment     Patient assistive device requirements for safe ambulation  Lupita Shutter Name 01/05/19 1600          Therapy Plan Communication    Therapy Plan Communication  Discussed therapy plan with Nursing and/or Physician;Encouraged out of bed with assistance by     Encouraged out of bed with assistance by  Nursing;Staff     Row Name 01/05/19 1600          Physical Therapy Patient Discharge Instructions    Your Physical Therapist suggests the following  Continue to follow your prescribed mobility precautions when moving in and out of bed and walking  as instructed;Supervision with walking is suggested for increased safety;Continue to use your assistive device as instructed when walking to improve your stability and prevent falls     Row Name 01/05/19 1600          MediCal  Treatment completed today    MediCal Treatment initial 30 minutes (x3908)  Completed     MediCal Treatment Add 15 min minutes (x3910)  2 +1     Row Name 01/05/19 1600          Treatment Time     Treatment start time  1415     Total TIMED Treatment  (min)  75     Total Treatment Time (min)  75         Post Acute Discharge Recommendations  Discharge Rehabilitation Reccomendations ([place] ONLY): If medically appropriate and available, patient demonstrates tolerance to participate in skilled therapy at the following anticipated level  Therapy level: Home health  Equipment recommendations: To be determined as patient progresses in therapy(Pt presently requires FWW for safe ambulation)    The physical therapist of record is endorsed by evaluating physical therapist.

## 2019-01-05 NOTE — Interdisciplinary (Signed)
Occupational Therapy Evaluation    Admitting Physician:  Zlomislic, Vinko, MD  Admission Date 01/03/2019    Inpatient Diagnosis:   Problem List       Codes    Spondylolisthesis of lumbar region     ICD-10-CM: M43.16  ICD-9-CM: 738.4    Retained orthopedic hardware     ICD-10-CM: Z96.9  ICD-9-CM: V45.89    Impaired functional mobility, balance, gait, and endurance     ICD-10-CM: Z74.09  ICD-9-CM: V49.89          IP Start of Service  Start of Care: 01/05/19  Reason for referral: Decline in functional ability/mobility;Decline in performance of activities of daily living (ADL)    Preferred Language:English         Past Medical History:   Diagnosis Date   . ADD (attention deficit disorder)    . Clavicular fracture       Past Surgical History:   Procedure Laterality Date   . BACK SURGERY  07/28/2018    In [place]   . HUMERUS FRACTURE SURGERY Left 1993   . ------------OTHER-------------  38 yo    humerus ORIF        OT Acute     Row Name 01/05/19 1000          Type of Visit    Type of Occupational Therapy note  Occupational Therapy Evaluation     Row Name 01/05/19 1000          Treatment Time    Treatment Start Time  0950     Total TIMED Treatment (min)  60     Total Treatment Time (min)  60     Row Name 01/05/19 1000          Treatment Precautions/Restrictions    Precautions/Restrictions  Fall;Postsurgical/procedural;Spine     Fall  Socks/charm     Other Precautions/Restrictions Information  LSO OOB, post-op spine precautions     Row Name 01/05/19 1000          Medical History    History of presenting condition Patient is a 38 yo female s/p L4-5 XLIF, removal of posterior instrumentation and PSF      Fall history  No falls reported in the last 6 months     Row Name 01/05/19 1000          Functional History    Prior Level of Function  Minimal deficits     General ADL/Self-Care Assistance Needs  Independent with ADLs and self care using adaptive device/equipment     Self Care Equipment/Device(s) in the home  Shower  chair/bench     Equipment required for mobility in the home  None;Other (Comment) friend gave pt walker to use post-op; wasn't usig before     Other Functional History Information  pt's dad assisting with IADLs in the weeks leading up to procedure     Row Name 01/05/19 1000          Social History    Living Situation  Lives alone;Other (Comment) dad staying with pt currently and for 2 weeks     Home Environment  Apartment     Home accessibility  Performs activities of daily living (ADL's) on one level;Other (Comment) apt on second floor, elevator access present     Bathroom accessibility  Tub/shower;Raised toilet;Shower/tub seat     Row Name 01/05/19 1000          Subjective    Subjective information Patient reports pain relief at this time  after taking pain medication     Patient status  Patient agreeable to treatment;Patient pain control adequate to participate in therapy;Nursing in agreement for treatment     Row Name 01/05/19 1000          Pain Assessment    Pain Asssessment Tool  Numeric Pain Rating Scale     Row Name 01/05/19 1000          Numeric Pain Rating Scale    Pain Intensity - rating at present  0     Pain Intensity- rating after treatment  7     Location  lower back with mobilization     Row Name 01/05/19 1000          Activities of Daily Living (ADLs)    Upper Body Dressing  Supervised     Other Upper Body Dressing Information  VC's for donning LSO     Lower Body Dressing  Moderate assistance (25-50% assistance)     Other Lower Body Dressing Information  pt able to complete fig 4 with RLE, unable with LLE     Toileting  Other (comments) CGA     Other Toileting Information  for clothing management     Toilet Transfers  Other (comments) CGA     Other [facility] Transfers Information  [facility] with use of grab bar     Row Name 01/05/19 1000          [place] AM-PAC: Daily Activity    Assistance Needed to Put on and Take off Regular Lower Body Clothing  2     Assistance Needed to Bathe, Including  Washing, Rinsing, and Drying  3     Assistance Needed to Toilet Agricultural consultant(Toilet, Bedpan, or Urinal)  3     Assistance Needed to Put on and Take off Regular Upper Body Clothing  3     Assistance Needed to Take Care of Personal Grooming Such as Brushing Teeth  3     Assistance Needed to Eat Meals  4     AM-PAC Daily Activity Total Score  18     AMP-PAC Daily Activity Impairment rating  Score 14-19 - 40-59% impaired     Row Name 01/05/19 1000          Objective    Overall Cognitive Status  Intact - no cognitive limitations or impairments noted     Communication  No communication limitations or impairments noted. Current status of hearing, speech and vision allow functional communication.     Coordination/Motor control  No limitations or impairments noted. Movement patterns are fluid and coordinated throughout     Balance  Balance limitations present     Static Sitting Balance  Good - able to maintain balance without handhold support, limited postural sway     Dynamic Sitting Balance  Fair - accepts minimal challenge, able to maintain balance while turning head/trunk     Static Standing Balance  Good - able to maintain balance without handhold support, limited postural sway     Dynamic Standing Balance  Fair - accepts minimal challenge, able to maintain balance while turning head/trunk     Other Balance Information  pt with 1 LOB [facility]     Extremity Assessment  Flexibility, strength, muscle tone and sensation grossly within functional limits throughout     Functional Mobility  Functional mobility deficits present     Bed Mobility  Supervised     Bed Mobility Comments  via log roll     Transfers to/from Stand  Other (comments) CGA     Ambulation during functional tasks  Other (comments) CGA     Device used for ambulation/mobility  Front wheeled walker     Ambulation Distance  within room/bathroom     Other Objective Findings Patient educated on role and purpose of OT, post-op spine precautions (provided with  handout), LSO use, compensatory dressing techniques, need for SUP for bathing/LBD/toilet tf initially, DME, home safety/fall prevention, log roll technique, and benefits of OOB to chair. Patient completed functional bed mobility supine to sitting EOB with SUP, required Min A for BLE later in session for sit to supine. Patient donned LSO with VC's. Patient with c/o minor dizziness upon sitting. BP 100/59. Patient requesting to use bathroom. Patient completed STS and ambulated to bathroom with FWW. Patient completed toilet tf to [facility] over toilet, and toileting. Patient with 1 minor LOB in prep for BSC tf. Patient deferred standing g/h dt wanting to avoid reaching to sink. Patient returned to room, engaged in LBD via fig 4 for RLE, unable to complete for LLE. Patient requesting to return to bed. Patient doffed LSO. Patient left supine.          OT Acute Tool Box     Row Name 01/05/19 1000          Cognition Assessment    Overall Cognitive Status  Intact - no cognitive limitations or impairments noted             Eval cont.     Row Name 01/05/19 1000          Patient/Family Education    Learner(s)  Patient     Learner response to rehab patient education interventions  Verbalizes understanding;Needs reinforcement     Row Name 01/05/19 1000          Assessment    Assessment Pt reports PLOF as Mod I with ADLs and functional tfs, requiring assist for IADLs. Pt currently functioning below baseline, limited by post- op spine precautions. Pt currently overall SUP/Mod A level for OOB ADLs and CGA for functional tfs. Patient would continue to benefit from IPOT to address deficits and increase safety and IND with ADL completion and functional tfs. Anticipate patient safe to return home to prior living situation with SUP and assist as needed from dad, who will be staying with her.      Rehab Potential  Good     Row Name 01/05/19 1000          Goal 1 (Short Term)    Impairment  Functional ability/mobility - Tub transfers      Functional ability/mobility - Tub transfers  Patient will demonstrate the ability to perform /complete / simulate tub transfers at modified independent/independent level     Custom goal  SUP     Number of visits  1-3     Goal Status  New     Row Name 01/05/19 1000          Goal 2 (Short Term)    Impairment  Activities of Daily Living - Lower Body Dressing     Activities of Daily Living - Lower Body Dressing  Patient able to perform and complete lower body dressing at modified independent/independent level     Custom goal  SUP level with AE/compensatory techniques PRN     Number of visits  1-3     Goal Status  New     Row  Name 01/05/19 1000          Goal 3 (Short Term)    Impairment  Functional ability/mobility - Standing activities     Functional ability/mobility - Standing activities  Patient will demonstrate the ability to perform /complete grooming and hygiene in standing position at modified independent/independent level     Number of visits  1-3     Goal Status  New     Row Name 01/05/19 1000          Goal 4 (Short Term)    Impairment  Safety/judgement     Safety/judgement  Patient and/or caregiver able to verbalize understanding of safety recommendations     Custom goal  post-op spine precautions     Number of visits  1-3     Goal Status  New     Row Name 01/05/19 1000          GOAL (Long Term)    Long Term Goal Impairment  Functional ability/mobility - Toileting     Functional ability/mobility - Toileting  Patient demonstrates the ability to perform and complete toilet transfers at modified independent/independent level     Long Term Custom Goal  with LRAD     Long Term Goal Number of visits  3-5     Long Term Goal Status  New     Row Name 01/05/19 1000          Planned Therapy Interventions and Rationale    Patient Education  to improve parent/caregiver proficiency with developmental interventions for child;to increase independence in functional activities;to increase knowledge of precautions to  prevent/minimize complications of condition     Self-Care/ADL Training  to improve independence with compensatory strategies;to improve home safety;to improve safety when completing daily activities and self care;to improve safety while using an assistive device;to improve independence with adaptive equipment     Therapeutic Activities  to improve transfers between surfaces     Row Name 01/05/19 1000          Treatment Plan Disussion    Treatment Plan Discussion and Agreement  Patient/family/caregiver stated understanding and agreement with the therapy plan     Row Name 01/05/19 1000          Treatment Plan    Continue therapy to address  Decline in functional ability/mobility;Decline in performance of activities of daily living (ADL)     Frequency of treatment  6 times per week     Duration of treatment (number of visits)  One time only, further treatment not indicated     Status of treatment  Patient evaluated and will benefit from ongoing skilled therapy     Interdisciplinary Recommendations  Physical Therapy consult     Row Name 01/05/19 1000          Patient Safety Considerations    Patient safety considerations  Patient returned to bed at end of treatment;Call light left in reach and fall precautions in place;Patient left  in appropriate pressure relieving position;Patient may be at risk for falls;Nursing notified of safety considerations at end of treatment     Patient assistive device requirements for safe ambulation  Lupita Shutter Name 01/05/19 1000          Post Acute Discharge Recommendations    Discharge Rehabilitation Reccomendations ( ONLY)  Patient would benefit from home safety evaluation upon discharge     Equipment recommendations  To be determined as patient progresses in therapy;Bedside Commode  Row Name 01/05/19 1000          Therapy Plan Communication    Therapy Plan Communication  Discussed therapy plan with Nursing and/or Physician     Row Name 01/05/19 1000          Occupational  Therapy Patient Discharge Instructions    Your Occupational Therapist suggests the following  Continue to follow your prescribed mobility precautions when transferring to the chair and toilet as instructed;Continue to complete your self care Activities of Daily Living as frequently as possible;Continue to use energy conservation, pursed lip breathing and self-pacing when completing your self care Activities of Daily Living     Row Name 01/05/19 1300          MediCal Evaluation    Medi-Cal Eval Initial 30 Minutes (x4100)  Completed     Medi-Cal Eval Addtl 15 Min [phone])  2           The occupational therapist of record is endorsed by evaluating occupational therapist.

## 2019-01-06 NOTE — Op Note (Signed)
DATE OF SERVICE:  01/03/2019    PREOPERATIVE DIAGNOSIS:    1.  Degenerative spondylolisthesis, L4-L5.  2.  Status post motion-sparing posterior spinal instrumentation,  L4-L5, in [place].   3.  Severe low back pain with leg weakness and radiculopathy.  4.  Status post stage 1 far lateral interbody fusion, L4-L5.    POSTOPERATIVE DIAGNOSIS:    1.  Degenerative spondylolisthesis, L4-L5.  2.  Status post motion-sparing posterior spinal instrumentation,  L4-L5, in [place].   3.  Severe low back pain with leg weakness and radiculopathy.  4.  Status post stage 1 far lateral interbody fusion, L4-L5.    PROCEDURE PERFORMED:    1.  Removal of retained instrumentation consisting of screws and rods  with hinge device, L4-L5.   2.  Revision laminectomies, partial L4 and L5, with bilateral  foraminotomies, L4-L5.   3.  Revision Smith-Petersen osteotomy, L4-L5.  4.  Decompression of cauda equina and exploration of nerve roots,  bilateral L5-L4, with resection of extensive epidural scar tissue.   5.  Posterior spinal instrumentation and fusion, L4-L5, with screws  and rods ([employer]), local bone autograft, DBM ([employer]ell),  beta-tricalcium phosphate (MagnetOs).   6.  Interpretation of intraoperative fluoroscopy.  7.  Interpretation of intraoperative neuromonitoring.  22-modifier due to complex revision resulting in increased time and difficulty    SURGEON/STAFF:  Keller Bounds, MD    ASSISTANT:  Mollie Germany, MD     Note, there was no qualified orthopedic surgery resident available to  assist with this case.     ANESTHESIA:  General.    FINDINGS:  Extensive epidural scar tissue, prior extensive bone  resection, severe stenosis, retained instrumentation with mobile  hinge device causing severe dural compression.     WOUND CLASSIFICATION:  Class 1.    WOUND CLOSURE STATUS:  All layers closed.    SPECIMENS:  Retained instrumentation to Pathology.    INTRAVENOUS FLUIDS:  2 L LR, 1 L albumin.    BLOOD PRODUCTS:   None.    ESTIMATED BLOOD LOSS:  150 cc.    URINE OUTPUT:  2300 cc.    COMPLICATIONS:  None.    INDICATIONS:  The patient is a 38 year old female who has  longstanding history of worsening low back pain with radiating  bilateral leg pain as well as numbness and weakness.  She initially  had presented to the spine clinic several months ago with mobile,  near grade 2 spondylolisthesis, for which She was evaluated after a  prolonged course of failed conservative management, including  activity modification, pain medications, physical therapy, and  injections.  At that time, she was interested in definitive surgical  address, and we explained to her that given her imaging findings and  degree of instability, surgical address would involve far lateral  interbody fusion, L4-L5, with posterior spinal instrumentation,  L4-L5, which we discussed with her could be completed in minimally  invasive fashion.  She was not in favor of a fusion procedure and  therefore elected to pursue other options and ultimately traveled to  [place], where she underwent an apparent lumbar decompression with  posterior instrumentation at L4-L5 consisting of a motion-sparing  device.  Upon completion, she returned to our clinic here in in [place] for followup and was complaining of severe pain and feelings of  instability in her lower back.  Her symptoms continued to progress to  involve intermittent and more frequent episodes of severe lower  extremity pain with weakness  and numbness to the point where she felt  her legs would give way sporadically.  She also felt ongoing and  worsening instability and hypermobility in her lower back.  Imaging  obtained demonstrated evidence of hypermobility, which does correlate  with the distribution of her symptoms, and we discussed options,  which at this point included only revision of retained  instrumentation to far lateral interbody fusion, L4-L5, and posterior  spinal instrumentation, L4-L5.  On review  of imaging, we again  discussed with her in detail that as part of her index procedure  there had been extensive bony resection to include complete  laminectomy with resultant iatrogenic instability to the level above,  at L3-L4, and as a result increased risk of adjacent segment  degeneration.  Given her young age and our desire to offer the least  invasive option for her, we discussed with her proceeding at this  time only with revision fusion at the L4-L5 level and not to proceed  with stabilization to the L3 level.  We did explain to her, however,  that as a result of over-aggressive resection at her index procedure  in [place] she is at increased risk of adjacent segment degeneration,  and she is aware of this.  We have, therefore, discussed with her a  plan for staged approach for far lateral interbody fusion, L4-L5,  followed then by removal of retained instrumentation, revision  laminectomies, and posterior spinal instrumentation fusion, L4-L5.  She completed first stage of surgery without difficulty and plan is  now to proceed as discussed.  We have previously discussed in detail  the planned procedure as well as risks, benefits, and alternatives.  Risks include, but are not limited to infection, bleeding, damage to  nerves and vessels, dural tear, ongoing back pain as well as leg pain  and/or weakness and/or numbness and paresthesias, hardware  complications or failure, pseudarthrosis or nonunion, additional  procedures, as well as the risks of general anesthesia, including but  not limited to DVT, PE, stroke, myocardial infarction, loss of life.  She expressed understanding and willingness to proceed in the form of  a signed consent.     PROCEDURE DESCRIPTION:  The patient was previously met in the  preoperative holding area, where we again reviewed the planned  procedure with her and answered all of her questions.  The operative  site was marked, and she was subsequently taken to the operating  theater,  where she succumbed to general endotracheal anesthesia and  was first placed in the right lateral decubitus position with left  side up with all of her extremities and bony prominences  appropriately padded and protected.  Neuromonitoring needles were  placed.  She received appropriate preoperative antibiotic  prophylaxis.     She then underwent first stage of surgery consisting of far lateral  interbody fusion, L4-L5.  For additional details with regard to this  stage of surgery, please refer to the separately dictated operative  note.     Upon completion of first stage, she was deemed appropriate to proceed  with completion of second stage and as such was transferred from the  lateral position and placed prone on a Jackson frame with all of her  extremities and bony prominences appropriately padded and protected.  Neuromonitoring needles were retained.  Antibiotic prophylaxis was  appropriately re-dosed.  The area over the low back was then prepped  and draped in the usual sterile fashion.  Prior to proceeding, we  performed a formal  preoperative verification time-out procedure, in  which we confirmed patient identity by name, number, and birth date,  as well as the planned procedure and availability of all necessary  equipment.     A 15-blade scalpel was used to make a midline incision in line of her  prior incision, which we utilized.  We were able to incorporate even  less of the incision and carried dissection through the superficial  layers, where there was again extensive scar tissue.  We continued  dissection with electrocautery and identified lumbodorsal fascia,  which was then incised, and we elevated the deep paraspinal muscles  in subperiosteal fashion.  Again, there was extensive laminectomy,  taking all of the L4 spinous process to the level of L3 as well as to  the level of L5 distally.  We continued again in meticulous fashion  to elevate the deep paraspinal muscles in subperiosteal fashion  and  were able to expose underlying instrumentation, consisting of a very  large and bulky hinge joint crossing over the dura attached through  set screw instrumentation to pedicle screws at L4 and L5.  Deep  retractors were placed to facilitate visualization.  We again exposed  the posterior elements from L4-L5 in order to allow for appropriate  visualization of retained instrumentation at this level.     Unusual procedure:    At this point, we then proceeded with removal of retained  instrumentation.  We again first evaluated this level from motion  standpoint, and there was obvious continued motion and instability at  this level.  We then used a universal removal set to remove retained  instrumentation, first consisting of set screws, following which we  were able to remove the hinged connector device.  At this point, we  then proceeded with planned removal of retained pedicle screw  instrumentation.  Because we did not have proprietary  instrumentation, we did attempt to use various screw removal options  from the universal set.  However, none of these would fit  appropriately, and therefore we cut a small rod, which we then were  able to by reattaching a set screw and securing it in each respective  pedicle screw in sequential fashion helicopter out the screws  independently and without difficulty.  Again, this was a  time-consuming process; however, we were able to do this  successfully.  We noted that there was a screw breach out the  anterior lateral cortex.     At this point, we then proceeded with re-instrumentation of levels L4  and L5.  A gearshift pedicle finder was used to recannulate the L5  levels to allow for appropriate positioning, especially on the left  side at L5, which was re-directed in more optimal fashion.  After  preparation of new screw tract on the left at L5 with a 4.35 mm tap,  we then positioned a 6 x 45 mm screw with excellent purchase and more  optimal position.  At the remaining  screw positions, which were found  to be adequate, we positioned 8 x 45 mm screws at L5 and 8 x 40 mm  screws bilaterally at L4.  All screws were tested with  neuromonitoring and fell well within acceptable limits.  AP and  lateral fluoroscopic imaging was obtained demonstrating appropriate  positioning of all screws.     At this point, we then proceeded with revision laminectomies.  There  was again evidence of extensive dural scarring, which was relatively  unexpected, especially given the  recency of the index surgery, but  likely coincident with the extent of hypermobility at this level.  There was essentially a bursal sac over the dorsal aspect of the dura  at the level of L4, which was very adherent and caused a mass  compressive affect onto the cauda equina at this level.  In  meticulous fashion, we first identified the posterolateral bony  columns with large Cobb curettes to identify the area of prior  decompression.  Again, we noted there was complete resection of the  L4 spinous process to include the L3-L4 interspinous and supraspinous  ligaments all the way to the caudal aspect of the L3 lamina and  spinous process with exposure of the L3-L4 facet joint from the index  surgery.  Care was taken to preserve the facets at L3-L4.  We were  able to identify the posterolateral bony margins, including the  inferior articular process of L4 as well as superior articular  process of L5.  There had been complete resection of the facet at  L4-L5, likely also contributing to significant hypermobility as a  result of the index surgery.  There was still continued severe  stenosis as a result of extensive scar tissue as well as retained  ligamentum flavum, which had not been adequately decompressed in the  lateral recesses.  High-speed matchstick bur was used to perform  revision partial laminectomy of the cephalad aspect of L5, and  straight- and forward-angled Epstein curettes were then used again to  develop a plane  between the underlying dura and the overlying lamina  of L5.  We then completed revision partial laminectomy with 3 and 4  mm Kerrison rongeurs, ensuring wide decompression of the cauda  equina.  We then used a high-speed matchstick bur to perform revision  laminectomy of a small portion of the remaining lateral lamina of L4,  which was causing significant and ongoing compression of the lateral  recesses, especially in the subarticular region of the inferior  articular process of L4.  In order to adequately decompress the  exiting nerve roots, which again were tethered in extensive scar  tissue, we performed not only revision laminectomy but also a  revision decompression with Smith-Petersen osteotomy.  A high-speed  matchstick bur was used to resect across the inferior articular  process at the level of the pars of L4 bilaterally, resecting the  remaining portion of the inferior articular processes bilaterally.  We completed Smith-Petersen osteotomy with combination of Leksell  rongeur as well as 3 and 4 mm Kerrison rongeurs, ensuring wide  decompression of the exiting nerve roots.  There was again extensive  scar tissue that was tethering the dura in addition to the bursal  sac, which was evident dorsally.  At this point, in meticulous  fashion, we used straight- and forward-angled Epstein curettes to  elevate the scar tissue and bursa off the underlying dura.  This was  again a very tedious and meticulous process so as not to violate the  underlying dura, which was preserved throughout.  We then, in  combination with pituitary rongeurs as well as 2 and 3 mm Kerrison  rongeurs, were able to elevate a large amount of scar tissue off the  underlying dura, which we decompressed from pedicle to pedicle and we  were able to safely resect all scar tissue.  We also resected scar  tissue from the area surrounding the exiting L4 as well as traversing  L5 nerve roots bilaterally.  Upon completion of  revision  laminectomies  and Smith-Petersen osteotomy, we confirmed wide  decompression of the cauda equina as well as exiting nerve roots.     At this point, we proceeded with completion of posterior spinal  instrumentation and fusion.  Again, pedicle screws had been revised  and replaced.  At this point, we then sized two 45 mm rods  appropriately.  We introduced additional lordosis with a [place]  bender, and rods were first secured distally at the L5 pedicle screw  with set screws.  We over-contoured rods, and we then proceeded with  reduction of L4 back onto L5 with combination of reduction towers  bilaterally in simultaneous fashion placed at the level of L4.  We  reduced slowly and in controlled fashion L4 back onto L5, and set  screws were placed.  We then compressed and final tightened between  L4 and L5 to achieve additional lordotic correction in order to  optimize segmental sagittal alignment at this level with  near-complete reduction of spondylolisthesis.  At this point, we then  final tightened with torque-limiting screwdriver set screws again  bilaterally at L4 and L5.  We then proceeded with preparation of  posterolateral fusion bed.  A high-speed matchstick bur was used to  decorticate the remaining bony elements of L4 and L5, including  transverse processes.  Local bone autograft was then combined with  demineralized bone matrix putty as well as cortical cancellous  allograft chips and rhBMP-2 into the posterolateral fusion bed.  At  this point, we then obtained final AP and lateral fluoroscopic  imaging, demonstrating excellent positioning of instrumentation with  reduction of spondylolisthesis and improvement of segmental sagittal  alignment.     We then proceeded with closure.  We confirmed excellent hemostasis.  Vancomycin powder was placed.  Lumbodorsal fascia was then  reapproximated with #1 Vicryl.  Superficial layers were closed in  layered fashion with 0 and 2-0 Vicryl.  Skin was closed with 4-0  Monocryl in  running subcuticular fashion.  Dermabond was placed as a  skin sealant.  Sterile dressings were placed.     The patient was then transferred to a regular bed and extubated  without difficulty.     DISPOSITION:  The patient returned to PACU in stable condition.  We  will continue mechanical DVT prophylaxis.  Mobilize with Physical  Therapy and LSO brace.  Anticipate discharge to home when clears PT.     There were no neuromonitoring issues throughout this case.      Job #:  [id number]

## 2019-01-06 NOTE — Interdisciplinary (Signed)
Occupational Therapy Discharge Summary    Admitting Physician:  Zlomislic, Vinko, MD  Admission Date 01/03/2019    Inpatient Diagnosis:   Problem List       Codes    Spondylolisthesis of lumbar region     ICD-10-CM: M43.16  ICD-9-CM: 738.4    Retained orthopedic hardware     ICD-10-CM: Z96.9  ICD-9-CM: V45.89    Impaired functional mobility, balance, gait, and endurance     ICD-10-CM: Z74.09  ICD-9-CM: V49.89    Decreased independence with activities of daily living     ICD-10-CM: Z65.8  ICD-9-CM: V62.89    Decreased activities of daily living (ADL)     ICD-10-CM: Z78.9  ICD-9-CM: V49.89        IP Start of Service  Start of Care: 01/05/19  Reason for referral: Decline in functional ability/mobility;Decline in performance of activities of daily living (ADL)    Preferred [place]    Past Medical History:   Diagnosis Date   . ADD (attention deficit disorder)    . Clavicular fracture       Past Surgical History:   Procedure Laterality Date   . BACK SURGERY  07/28/2018    In [place]   . HUMERUS FRACTURE SURGERY Left 1993   . ------------OTHER-------------  38 yo    humerus ORIF      OT Acute     Row Name 01/06/19 1200          Type of Visit    Type of Occupational Therapy note  Occupational Therapy Discharge Summary     Row Name 01/06/19 1200          Treatment Time    Treatment Start Time  1026     Total TIMED Treatment (min)  -- 55     Total Treatment Time (min)  35     Row Name 01/06/19 1200          Treatment Precautions/Restrictions    Precautions/Restrictions  Fall;Postsurgical/procedural;Spine     Fall  Socks/charm     Other Precautions/Restrictions Information  LSO OOB, post-op spine precautions     Row Name 01/06/19 1200          Medical History    History of presenting condition  Patient is a 38 yo female s/p L4-5 XLIF, removal of posterior instrumentation and PSF      Fall history  No falls reported in the last 6 months     [place] Name 01/06/19 1200          Functional History    Prior Level of Function   Minimal deficits     General ADL/Self-Care Assistance Needs  Independent with ADLs and self care using adaptive device/equipment     Self Care Equipment/Device(s) in the home  Shower chair/bench     Equipment required for mobility in the home  None;Other (Comment) friend gave pt walker to use post-op; wasn't usig before     Other Functional History Information  pt's dad assisting with IADLs in the weeks leading up to procedure     Row Name 01/06/19 1200          Social History    Living Situation  Lives alone;Other (Comment) dad staying with pt currently and for 2 weeks     [place] accessibility  Performs activities of daily living (ADL's) on one level;Other (Comment) apt on second floor, elevator access present     Bathroom accessibility  Tub/shower;Raised  toilet;Shower/tub seat     Row Name 01/06/19 1200          Subjective    Subjective information "I feel good. I know I just need to go slow and be careful not to bend"     Patient status  Patient agreeable to treatment;Patient pain control adequate to participate in therapy;Nursing in agreement for treatment     [place] Name 01/06/19 1200          Pain Assessment    Pain Asssessment Tool  Numeric Pain Rating Scale     Row Name 01/06/19 1200          Numeric Pain Rating Scale    Pain Intensity - rating at present  0     Pain Intensity- rating after treatment  4     Location  lower back with mobilization, reporting tolerable level     Row Name 01/06/19 1200          Activities of Daily Living (ADLs)    Self Grooming  Modified independent;Supervised     Other Self Grooming Information  -Educated on post operative precautions and modified stance standing at sink side to prevent bending  -Following education, pt mod I standing hygiene grooming at sink side      Upper Body Dressing  Modified independent     Other Upper Body Dressing Information  -Donning/Doffing/Adjusting LSO sitting EOB      Lower Body Dressing  Other (comments)     Other  Lower Body Dressing Information  -Simulated figure four  -able to achieve functional reach (R>L)  -Educated on compensatory technique of donning LLE prior to RLE, verbalized understanding  -owns reacher and able to utilize as needed for functional reach      Bathing  Other (comments)     Other Bathing Information  -Not formally assessed  -Simulated tub transfer with pt requiring supervision to complete with cues for sequence     Toileting  Modified independent     Toilet Transfers  Modified independent     [place] Name 01/06/19 1200          [place] AM-PAC: Daily Activity    Assistance Needed to Put on and Take off Regular Lower Body Clothing  4     Assistance Needed to Bathe, Including Washing, Rinsing, and Drying  3     Assistance Needed to Toilet Environmental manager, Bedpan, or Urinal)  4     Assistance Needed to Put on and Take off Regular Upper Body Clothing  4     Assistance Needed to Nina Such as Brushing Teeth  4     Assistance Needed to Eat Meals  4     AM-PAC Daily Activity Total Score  23     AMP-PAC Daily Activity Impairment rating  Score 23 - 1-19% impaired     Row Name 01/06/19 1200          Objective    Overall Cognitive Status  Intact - no cognitive limitations or impairments noted     Communication  No communication limitations or impairments noted. Current status of hearing, speech and vision allow functional communication.     Coordination/Motor control  No limitations or impairments noted. Movement patterns are fluid and coordinated throughout     Balance  Balance limitations present     Static Sitting Balance  Good - able to maintain balance without handhold support, limited postural sway     Dynamic Sitting Balance  Good - accepts moderate challenge, able to maintain balance while picking object off floor     Static Standing Balance  Good - able to maintain balance without handhold support, limited postural sway     Dynamic Standing Balance  Fair - accepts minimal challenge, able to  maintain balance while turning head/trunk     Extremity Assessment  Flexibility, strength, muscle tone and sensation grossly within functional limits throughout     Functional Mobility  Functional mobility deficits present     Bed Mobility  Modified independent     Bed Mobility Comments  Good compliance with log roll technique  -Supine to sit and sit to supine     Transfers to/from Stand  Modified independent;Supervised     Ambulation during functional tasks  Modified independent;Supervised     Device used for ambulation/mobility  Front wheeled walker     Ambulation Distance In room ambulation with FWW     Other Objective Findings Chart reviewed and RN cleared pt for OT treatment. Pt received supine in bed, agreeable to OT treatment. Pt able to recall all post operative precautions. Functional in room ambulation and ADLs completed (see above). Extensive education on compensatory technique to complete higher level ADLs/IADLs upon discharge in compliance with precautions. Pt verbalized understanding. Pt left supine in bed, all needs met, call light in reach, RN made aware.          OT Acute Tool Box     Row Name 01/06/19 1200          Cognition Assessment    Overall Cognitive Status  Intact - no cognitive limitations or impairments noted         Eval cont.     [place] Name 01/06/19 1200          Patient/Family Education    Learner(s)  Patient     Learner response to rehab patient education interventions  Verbalizes understanding     [place] Name 01/06/19 1200          Assessment    Assessment Pt cooperative with OT treatment. Pt with good recall and compliance with post operative precautions. Mobilizing well with modified independence utilizing FWW. Overall mod I to supervision for ADLs. Extensive education on compensatory techniques for return to functional higher level tasks within parameters of precautions, verbalized understanding. No further inpatient OT needs at this time. Recommend discharge home with increased  family/friend support. Home health PT to follow.      Rehab Potential  Good     Row Name 01/06/19 1200          Goal 1 (Short Term)    Impairment  Functional ability/mobility - Tub transfers     Functional ability/mobility - Tub transfers  Patient will demonstrate the ability to perform /complete / simulate tub transfers at modified independent/independent level     Custom goal  SUP     Number of visits  1-3     Goal Status  Met     Row Name 01/06/19 1200          Goal 2 (Short Term)    Impairment  Activities of Daily Living - Lower Body Dressing     Activities of Daily Living - Lower Body Dressing  Patient able to perform and complete lower body dressing at modified independent/independent level     Custom goal  SUP level with AE/compensatory techniques PRN     Number of visits  1-3     Goal Status  Met     Row Name 01/06/19 1200          Goal 3 (Short Term)    Impairment  Functional ability/mobility - Standing activities     Functional ability/mobility - Standing activities  Patient will demonstrate the ability to perform /complete grooming and hygiene in standing position at modified independent/independent level     Number of visits  1-3     Goal Status  Met     Row Name 01/06/19 1200          Goal 4 (Short Term)    Impairment  Safety/judgement     Safety/judgement  Patient and/or caregiver able to verbalize understanding of safety recommendations     Custom goal  post-op spine precautions     Number of visits  1-3     Goal Status  Met     Row Name 01/06/19 1200          GOAL (Long Term)    Long Term Goal Impairment  Functional ability/mobility - Toileting     Functional ability/mobility - Toileting  Patient demonstrates the ability to perform and complete toilet transfers at modified independent/independent level     Long Term Custom Goal  with LRAD     Long Term Goal Number of visits  3-5     Long Term Goal Status  Met     Row Name 01/06/19 1200          Planned Therapy Interventions and Rationale    Patient  Education  to improve parent/caregiver proficiency with developmental interventions for child;to increase independence in functional activities;to increase knowledge of precautions to prevent/minimize complications of condition     Self-Care/ADL Training  to improve independence with compensatory strategies;to improve home safety;to improve safety when completing daily activities and self care;to improve safety while using an assistive device;to improve independence with adaptive equipment     Therapeutic Activities  to improve transfers between surfaces     Row Name 01/06/19 1200          Treatment Plan Disussion    Treatment Plan Discussion and Agreement  Patient/family/caregiver stated understanding and agreement with the therapy plan     [place] Name 01/06/19 1200          Treatment Plan    Continue therapy to address  Decline in functional ability/mobility;Decline in performance of activities of daily living (ADL)     Frequency of treatment  Other (comments) No further inpatient OT needs     Status of treatment  Patient appropriate for discharge from therapy     [place] Name 01/06/19 1200          Patient Safety Considerations    Patient safety considerations  Patient returned to bed at end of treatment;Call light left in reach and fall precautions in place;Patient left  in appropriate pressure relieving position;Patient may be at risk for falls;Nursing notified of safety considerations at end of treatment     Patient assistive device requirements for safe ambulation  [place] Name 01/06/19 1200          Post Acute Discharge Recommendations    Discharge Rehabilitation Reccomendations ([place])  Patient would benefit from home safety evaluation upon discharge     Equipment recommendations  No equipment needed - patient has own equipment     [place] Name 01/06/19 1200          Therapy Plan Communication    Therapy Plan Communication  Discussed therapy plan with Nursing and/or Physician     [place] Name 01/06/19 1200           Occupational Therapy Patient Discharge Instructions    Your Occupational Therapist suggests the following  Continue to follow your prescribed mobility precautions when transferring to the chair and toilet as instructed;Continue to complete your self care Activities of Daily Living as frequently as possible;Continue to use energy conservation, pursed lip breathing and self-pacing when completing your self care Activities of Daily Living     [place] Name 01/06/19 1200          Discharge Report    Discharge Report Date  01/06/19     Reason for discharge  Goals met, no further skilled treatment indicated     Discharge [place] with family      Patient participation  Excellent, patient participated in all treatment sessions     Patient compliance with therapy program  Excellent     Response to therapy  Excellent     Row Name 01/06/19 1200          MediCal Treatment Completed Today    MediCal Treatment Initial 30 Minutes [phone])  Completed     MediCal Treatment Addtl 15 Minutes [phone])  2           The occupational therapist of record is endorsed by evaluating occupational therapist.

## 2019-01-06 NOTE — Interdisciplinary (Signed)
Physical Therapy Discharge Summary    Admitting Physician:  Zlomislic, Vinko, MD  Admission Date 01/03/2019    Inpatient Diagnosis:   Problem List       Codes    Spondylolisthesis of lumbar region     ICD-10-CM: M43.16  ICD-9-CM: 738.4    Retained orthopedic hardware     ICD-10-CM: Z96.9  ICD-9-CM: V45.89    Impaired functional mobility, balance, gait, and endurance     ICD-10-CM: Z74.09  ICD-9-CM: V49.89    Decreased independence with activities of daily living     ICD-10-CM: Z65.8  ICD-9-CM: V62.89          IP Start of Service   Start of Care: 01/04/19  Onset Date: 01/03/2019  Reason for referral: Decline in functional ability/mobility    Preferred [place]         Past Medical History:   Diagnosis Date   . ADD (attention deficit disorder)    . Clavicular fracture       Past Surgical History:   Procedure Laterality Date   . BACK SURGERY  07/28/2018    In [place]   . HUMERUS FRACTURE SURGERY Left 1993   . ------------OTHER-------------  38 yo    humerus ORIF        PT Acute     Row Name 01/06/19 0900          Type of Visit    Type of Physical Therapy note  Physical Therapy Discharge Summary     Row Name 01/06/19 0900          Treatment Precautions/Restrictions    Precautions/Restrictions  Spine;Postsurgical/procedural     Other Precautions/Restrictions Information  Post-op spine precautions, LSO when [place] Name 01/06/19 0900          Medical History    History of presenting condition  POD#3 s/p L4-5 XLIF, removal of posterior instrumentation and PSF     Row Name 01/06/19 0900          Subjective    Subjective Information  pt received supine in bed, agreeable to PT Tx.      Patient status  Patient agreeable to treatment;Nursing in agreement for treatment;Patient pain control adequate to participate in therapy     [place] Name 01/06/19 0900          Pain Assessment    Pain Asssessment Tool  Numeric Pain Rating Scale     Row Name 01/06/19 0900          Numeric Pain Rating Scale    Pain Intensity - rating at  present  4     Pain Intensity- rating after treatment  4     Location  Back     Row Name 01/06/19 0900          Objective    Overall Cognitive Status  Intact - no cognitive limitations or impairments noted     Communication  No communication limitations or impairments noted. Current status of hearing, speech and vision allow functional communication.     Coordination/Motor control  No limitations or impairments noted. Movement patterns are fluid and coordinated throughout     Balance  Balance limitations present     Static Sitting Balance  Normal - able to maintain steady balance without handhold support     Dynamic Sitting Balance  Normal - accepts maximal challenge and can shift weight easily within full range in all directions     Static Standing Balance  Good - able to maintain balance without handhold support, limited postural sway     Dynamic Standing Balance  Fair - accepts minimal challenge, able to maintain balance while turning head/trunk     Extremity Assessment  Flexibility, strength, muscle tone and sensation grossly within functional limits throughout     Functional Mobility  Functional mobility deficits present     Bed Mobility  Modified independent     Bed Mobility Comments  supine<>sit using log roll technique, HOB flat, w/o use of bed rail     Transfers to/from Stand  Modified independent     Transfer Comments  Mod I for sit<>stand w/ FWW     Gait  Modified independent     Gait Comments  Mod I for ambulation x257f w/ FWW. Pt demonstrates steady gait, slow cadence, no LOB     Device used for ambulation/mobility  Front wheeled walker     Ambulation Distance  200     Other Objective Findings  Pt able to independently donn/doff LSO. Pt demonstrates good adherence to postop spine precautions w/ all functional tasks performed. Pt educated on d/c recs: supervision for OOB activity, FWW and LSO for all OOB activity, follow up w/ HHPT, postop spine precautions, home walking program               Eval cont.      [place]Name 01/06/19 0900          [place] AM-PAC: Basic Mobility    Assistance Needed to Turn from Back to Side While in a Flat Bed Without Using Bedrails  4 - None (independent)     Difficulty with Supine to Sit Transfer  4 - None (independent)     How Much Help Needed to Move to/from Bed to Chair  4 - None (independent)     Difficulty with Sit to Stand Transfer from Chair with Arms  4 - None (independent)     How Much Help Needed to Walk in Room  4 - None (independent)     How Much Help Needed to Climb 3-5 Steps with a Rail  3 - A little (supervised/min assist)     AMPAC Total Score  23     Assessment: AM-PAC Basic Mobility Impairment Rating  Score 23 - 1-19% impaired     Row Name 01/06/19 0900          Patient/Family Education    Learner(s)  Patient     Learner response to rehab patient education interventions  Verbalizes understanding;Able to return demonstrate teaching     [place] 01/06/19 0900          Assessment    Assessment  Pt overall Modified Independent for functional mobility, able to ambulate x2053fw/ FWW. Pt demonstrates improved activity tolerance and pain control this session. Pt without reports of dizziness w/ OOB activity.  Pt has met all IP PT goals, no further skilled IP PT needs. Pt encouraged to continue participating in progressive mobility protocol w/ RN supervision, for duration of hospital stay, verbalized understanding. Recommend d/ c home w/ supervision, FWW for all OOB activity and ongoing [facility], when medically stable.      Rehab Potential  Good     Row Name 01/06/19 0900          Goal 1 (Short Term)    Impairment  Education need     Custom goal  Pt will be independent to donn/doff LSO and will be able to recite  spine precautions.      Number of visits  1-3     Goal Status  Met     Row Name 01/06/19 0900          Goal 2 (Short Term)    Impairment  Functional mobility limitation     Custom goal  Pt will be supervised for bed mobility, using log roll technique     Number of visits  1-3      Goal Status  Met     Row Name 01/06/19 0900          Goal 3 (Short Term)    Impairment  Functional mobility limitation     Custom goal  Pt will be supervised for sit<>stand transfers w/ LR AD     Number of visits  1-3     Goal Status  Met     Row Name 01/06/19 0900          Goal 4 (Short Term)    Impairment  Gait impairment     Gait  Patient able to consistently ambulate household distances with least restrictive assistive device with no more than supervision assistance     Number of visits  3-5     Goal Status  Met     Row Name 01/06/19 0900          Treatment Plan Disussion    Treatment Plan Discussion and Agreement  Patient support system determined and all questions were asked and answered;Patient/family/caregiver stated understanding and agreement with the therapy plan     Row Name 01/06/19 0900          Treatment Plan    Frequency of treatment  Patient appropriate for discharge from therapy     Status of treatment  Patient appropriate for discharge from therapy     [place] Name 01/06/19 0900          Patient Safety Considerations    Patient safety considerations  Patient returned to bed at end of treatment;Call light left in reach and fall precautions in place;Nursing notified of safety considerations at end of treatment     Patient assistive device requirements for safe ambulation  [place] Name 01/06/19 0900          Therapy Plan Communication    Therapy Plan Communication  Discussed therapy plan and patient's mobility status with Case Manager;Discussed therapy plan with Nursing and/or Physician     Row Name 01/06/19 0900          Physical Therapy Patient Discharge Instructions    Your Physical Therapist suggests the following  Continue to follow your prescribed mobility precautions when moving in and out of bed and walking  as instructed;Supervision with walking is suggested for increased safety;Continue to use your assistive device as instructed when walking to improve your stability and prevent  falls     Row Name 01/06/19 0900          Discharge Report    Discharge Report Date  01/06/19     Reason for discharge  Goals met, no further skilled treatment indicated     Patient participation  Good, patient participated in at least 75% of all treatment sessions     Patient compliance with therapy program  Good     Response to therapy  Good     Row Name 01/06/19 0900          MediCal Treatment completed today    MediCal Treatment  initial 30 minutes (x3908)  Completed     MediCal Treatment Add 15 min minutes (x3910)  1     Row Name 01/06/19 0900          Treatment Time     Treatment start time  0900     Total TIMED Treatment  (min)  45     Total Treatment Time (min) 40         Post Acute Discharge Recommendations  Discharge Rehabilitation Reccomendations ([place] ONLY): If medically appropriate and available, patient demonstrates tolerance to participate in skilled therapy at the following anticipated level  Therapy level: Home health  Equipment recommendations: No equipment needed - patient has own equipment(walker)    The physical therapist of record is endorsed by evaluating physical therapist.

## 2019-01-06 NOTE — Progress Notes (Signed)
Orthopedics Spine Progress Note  01/06/2019    Patient ID:  Name: Haley Barrett  MRN: [id number]  DOB: 10/01/1980    Procedures:  01/03/2019 - L4-5 XLIF, removal of posterior instrumentation and PSF     Subjective:  Progressing well. Pain is improving. No leg pain. Walked with PT yesterday. xrays completed. Patient plans to go home tomorrow pending PT and pain control today/tonight    Objective:  Vital Signs:  BP 105/62 (BP Location: Left arm, BP Patient Position: Supine)   Pulse 68   Temp 98.8 F (37.1 C)   Resp 17   Ht 5\' 10"  (1.778 m)   Wt 78.9 kg (174 lb)   LMP 12/12/2018 (Approximate)   SpO2 99%   BMI 24.97 kg/m     Physical Exam:  General: patient awake, alert, and responding to commands; no apparent distress  Cardio: regular rate and rhythm per pulse  Respiratory: patient breathing comfortably without use of accessory muscles  Neck/Back/Abdomen: dressings in place, clean/dry/intact      Bilateral Lower Extremity   Motor:       Right           Left        Iliopsoas (L2/3)                     5/5           5/5         Quadriceps (L4)                    5/5           5/5        Tibialis Anterior (L4/5)           5/5           5/5        EHL (L5)                               5/5           5/5        GSC (S1)                              5/5           5/5   Sensation:        SILT in L2-S1 distributions.   Special Tests:        Downgoing plantar response. No clonus.    Vascular Exam:        Warm and well perfused distally    Laboratory Data:   Lab Results   Component Value Date    WBC 9.9 01/05/2019    HGB 8.6 (L) 01/05/2019    HCT 26.3 (L) 01/05/2019    PLT 148 01/05/2019     Lab Results   Component Value Date    NA 140 01/04/2019    K 3.9 01/04/2019    CL 106 01/04/2019    BICARB 23 01/04/2019    BUN 5 (L) 01/04/2019    CREAT 0.70 01/04/2019    GLU 127 (H) 01/04/2019    [place] 8.0 (L) 01/04/2019       Current Medications:  Current Facility-Administered Medications   Medication   . acetaminophen (TYLENOL) tablet  975 mg   . ALPRAZolam (XANAX) tablet 0.25 mg   . ascorbic  acid (VITAMIN C) tablet 500 mg   . bisacodyl (DULCOLAX) suppository 10 mg   . calcium carbonate (TUMS) chewable tablet 1,000 mg   . diazepam (VALIUM) tablet 5 mg   . diphenhydrAMINE (BENADRYL) tablet 25 mg   . docusate sodium (COLACE) capsule 250 mg   . famotidine (PEPCID) IVPB 20 mg    Or   . famotidine (PEPCID) tablet 20 mg   . gabapentin (NEURONTIN) capsule 300 mg   . ketorolac (TORADOL) injection 15 mg   . lidocaine (ASPERCREME) 4 % patch 2 patch   . magnesium hydroxide (MILK OF MAGNESIA) suspension 30 mL   . menthol (CEPACOL) lozenge 3 mg   . multivitamin tablet 1 tablet   . ondansetron (ZOFRAN) injection 4 mg   . oxyCODONE (OXYCONTIN) ER tablet 20 mg   . oxyCODONE (ROXICODONE) tablet 10 mg   . oxyCODONE (ROXICODONE) tablet 5 mg   . polyethylene glycol (MIRALAX) packet 17 g   . psyllium (METAMUCIL) 58.12 % packet 1 packet   . senna (SENOKOT) tablet 17.2 mg   . sodium chloride 0.9% infusion   . tiZANidine (ZANAFLEX) tablet 4 mg   . zinc sulfate capsule 220 mg (50 mg elemental zinc)   . zolpidem (AMBIEN) tablet 5 mg       Assessment:   38 year old female 3 Days Post-Op s/p above procedures. Patient doing well and progressing appropriately.   - Acute Blood Loss Anemia with component of hemodilution    Plan:   - PT: OOB with LSO brace  - Antibiotics: complete peri-operative   - DVT Prophylaxis: no pharmacologic prophylaxis; SCDs/TED hose, ambulation  - Pain Medication: PO with IVBT  - Diet: regular  - Post op imaging: obtained and reviewed  - Drain: none  - Other: aggressive incentive spirometry  - Dispo: pending PT clearance, pain control on oral pain medication, CM clearance      Ethlyn Daniels, MD  Ortho Spine Fellow  PID [id number]      Please page the Orthopaedic Spine Surgery Team with questions or concerns based on patient location:     Established spine floor patients at [place]: Ortho Spine 1 - [phone]      Established spine floor patients at  [facility]: Ortho Spine 2 - [phone]      New spine consultations not yet followed by spine team:    [facility] on call for "SPINE CONSULT" call schedule (Orthopaedic Spine versus Neurosurgery)   If Orthopaedic Spine is on call please page the PRIMARY CONSULT RESIDENT on call:   [place]: ORTHOPEDICS/TH   [place]: ORTHOPEDICS/HC

## 2019-01-06 NOTE — Plan of Care (Signed)
Problem: Promotion of Health and Safety  Goal: Promotion of Health and Safety  Description  The patient remains safe, receives appropriate treatment and achieves optimal outcomes (physically, psychosocially, and spiritually) within the limitations of the disease process by discharge.    Information below is the current care plan.  Outcome: Progressing  Flowsheets  Taken 01/05/2019 2000 by Chenay Nesmith, Bethanne Ginger, RN  Patient /Family stated Goal: pain control  Taken 01/05/2019 1617 by Zoila Shutter, RN  Guidelines: Inpatient Nursing Guidelines  Individualized Interventions/Recommendations #1: Pain assessment done and patient medicated with po pain medication as needed.  Individualized Interventions/Recommendations #2 (if applicable): Patient assisted to the bathroom as needed.  Individualized Interventions/Recommendations #3 (if applicable): Vitals, intak and out put monitored. monitored.  Taken 01/06/2019 0312 by Sherle Poe, RN  Individualized Interventions/Recommendations #4 (if applicable): assessed dressing for any s/s of infection or bleeding  Individualized Interventions/Recommendations #5 (if applicable): assisted patient to needs and concerns  Outcome Evaluation (rationale for progressing/not progressing) every shift: AOx4, afebrile, not in distress, pain med. given as needed for back pain. dressing dry and intact, encouraged to verbalize needs and concerns, provided a quiet and restful

## 2019-01-06 NOTE — Plan of Care (Signed)
Problem: Promotion of Health and Safety  Goal: Promotion of Health and Safety  Description  The patient remains safe, receives appropriate treatment and achieves optimal outcomes (physically, psychosocially, and spiritually) within the limitations of the disease process by discharge.    Information below is the current care plan.  Outcome: Progressing  Flowsheets  Taken 01/06/2019 0820  Patient /Family stated Goal: pain control  Taken 01/06/2019 1846  Guidelines: Inpatient Nursing Guidelines  Outcome Evaluation (rationale for progressing/not progressing) every shift: Pain controlled with prescribed pain medications. Dressing CDI. MOM given for constipation. Ambulatory wit hwalker and brace. Tolerating diet.

## 2019-01-06 NOTE — Interdisciplinary (Signed)
Anticipated discharge Saturday pending PT clearance, pain control and final MD order.  Final discharge recommendations and arrangement discussed and agreed upon by patient .  DC needs met.     01/06/19 1004   Discharge Plan   Living Arrangements * Alone  (Father will stay with her for 2 wks.)   Patient/Family/Other Engaged in Discharge Planning * Yes   Name, Relationship and Phone Number of Person Engaged in the Discharge Plan Patient   Family/Caregiver's Assessed for * Readiness, willingness, and ability to provide or support self-management activities   Respite Care * Not Applicable   Patient/Family/Other Are In Agreement With Discharge Plan * Yes   Patient Has Decision Making Capacity * Yes   Verified phone number for DC location * Yes   Verified address for DC location * Yes   Patient/Family/Legal/Surrogate Decision Maker Has Been Given a List Options And Choice In The Selection of Post-Acute Care Providers * Yes   CM discussed the following with pt, and/or family, and/or DPOA [place] has agreements with select post-acute care providers in the collaborative care network;If patient has chosen [place] or [facility] of [facility] for post-acute care, they were informed that we partner with, and have a financial interest in these organizations   Patient's Top 3 [place]   If Medicare or Medicare Managed Care: Important Message from [place]   If Medicare or [place]: Important Message from Medicare Given No   [place] Provided to Patient Not Applicable   Discharge Planning Needs   Actions Needed for Discharge Final [place] care arranged   Planned living arrangements after discharge: Patient will DC home with father.    Does this patient have CM discharge planning needs? Yes   CM Needs Met? Yes   Does this patient have SW discharge planning needs? No   Discharge Transportation   Transportation * Family/Friend   Charity fundraiser Details * Father can pick up   Final Discharge Destination/Services   Final Discharge Destination/Services * Home Health  (No DME needs , Has a walker)   Home Health/Home [facility] for PT and RN visit.  Provider will call  prior to initial visit 1-2 days after discharge   Phone * [phone]

## 2019-01-07 ENCOUNTER — Other Ambulatory Visit: Payer: Self-pay

## 2019-01-07 MED ORDER — ACETAMINOPHEN 325 MG PO TABS
975.0000 mg | ORAL_TABLET | Freq: Three times a day (TID) | ORAL | 1 refills | Status: DC
Start: 2019-01-07 — End: 2019-01-18
  Filled 2019-01-07: qty 90, 10d supply, fill #0

## 2019-01-07 MED ORDER — TIZANIDINE HCL 4 MG OR TABS
4.0000 mg | ORAL_TABLET | Freq: Four times a day (QID) | ORAL | 0 refills | Status: DC | PRN
Start: 2019-01-07 — End: 2019-04-19
  Filled 2019-01-07: qty 40, 10d supply, fill #0

## 2019-01-07 MED ORDER — DOCUSATE SODIUM 250 MG OR CAPS
250.0000 mg | ORAL_CAPSULE | Freq: Every evening | ORAL | 0 refills | Status: DC
Start: 2019-01-07 — End: 2019-03-03
  Filled 2019-01-07: qty 60, 60d supply, fill #0

## 2019-01-07 MED ORDER — GABAPENTIN 300 MG OR CAPS
300.0000 mg | ORAL_CAPSULE | Freq: Three times a day (TID) | ORAL | 0 refills | Status: DC
Start: 2019-01-07 — End: 2019-03-03
  Filled 2019-01-07: qty 90, 30d supply, fill #0

## 2019-01-07 MED ORDER — OXYCODONE HCL 5 MG OR TABS
5.0000 mg | ORAL_TABLET | ORAL | 0 refills | Status: DC | PRN
Start: 2019-01-07 — End: 2019-01-18
  Filled 2019-01-07: qty 60, 10d supply, fill #0

## 2019-01-07 MED ORDER — OXYCODONE HCL ER 20 MG PO T12A
20.0000 mg | EXTENDED_RELEASE_TABLET | Freq: Two times a day (BID) | ORAL | 0 refills | Status: AC
Start: 2019-01-07 — End: 2019-01-09
  Filled 2019-01-07: qty 4, 2d supply, fill #0

## 2019-01-07 MED ORDER — ONDANSETRON 4 MG OR TBDP
4.0000 mg | ORAL_TABLET | Freq: Once | ORAL | Status: DC
Start: 2019-01-07 — End: 2019-01-07

## 2019-01-07 NOTE — Plan of Care (Signed)
Problem: Promotion of Health and Safety  Goal: Promotion of Health and Safety  Description  The patient remains safe, receives appropriate treatment and achieves optimal outcomes (physically, psychosocially, and spiritually) within the limitations of the disease process by discharge.    Information below is the current care plan.  Outcome: Discharged  Note:   Patient D/C home. Instructions and teaching provided. Patient took all belongings with her. Medications picked up from pharmacy.

## 2019-01-07 NOTE — Progress Notes (Signed)
Orthopedics Spine Progress Note  01/07/2019    Patient ID:  Name: Haley Barrett  MRN: [id number]  DOB: 06-Mar-1981    Procedures:  01/03/2019 - L4-5 XLIF, removal of posterior instrumentation and PSF     Subjective:  No acute events. Ready for discharge today. Cleared PT    Objective:  Vital Signs:  BP 100/59 (BP Location: Right arm, BP Patient Position: Semi-Fowlers)   Pulse 61   Temp 98.4 F (36.9 C)   Resp 16   Ht 5\' 10"  (1.778 m)   Wt 78.9 kg (174 lb)   LMP 12/12/2018 (Approximate)   SpO2 98%   BMI 24.97 kg/m     Physical Exam:  General: patient awake, alert, and responding to commands; no apparent distress  Cardio: regular rate and rhythm per pulse  Respiratory: patient breathing comfortably without use of accessory muscles  Neck/Back/Abdomen: dressings in place, clean/dry/intact      Bilateral Lower Extremity   Motor:       Right           Left        Iliopsoas (L2/3)                     5/5           5/5         Quadriceps (L4)                    5/5           5/5        Tibialis Anterior (L4/5)           5/5           5/5        EHL (L5)                               5/5           5/5        GSC (S1)                              5/5           5/5   Sensation:        SILT in L2-S1 distributions.   Special Tests:        Downgoing plantar response. No clonus.    Vascular Exam:        Warm and well perfused distally    Laboratory Data:   Lab Results   Component Value Date    WBC 9.9 01/05/2019    HGB 8.6 (L) 01/05/2019    HCT 26.3 (L) 01/05/2019    PLT 148 01/05/2019     Lab Results   Component Value Date    NA 140 01/04/2019    K 3.9 01/04/2019    CL 106 01/04/2019    BICARB 23 01/04/2019    BUN 5 (L) 01/04/2019    CREAT 0.70 01/04/2019    GLU 127 (H) 01/04/2019    [place] 8.0 (L) 01/04/2019       Current Medications:  Current Facility-Administered Medications   Medication   . acetaminophen (TYLENOL) tablet 975 mg   . ALPRAZolam (XANAX) tablet 0.25 mg   . ascorbic acid (VITAMIN C) tablet 500 mg   . bisacodyl  (DULCOLAX) suppository 10 mg   .  calcium carbonate (TUMS) chewable tablet 1,000 mg   . diazepam (VALIUM) tablet 5 mg   . diphenhydrAMINE (BENADRYL) tablet 25 mg   . docusate sodium (COLACE) capsule 250 mg   . famotidine (PEPCID) IVPB 20 mg    Or   . famotidine (PEPCID) tablet 20 mg   . gabapentin (NEURONTIN) capsule 300 mg   . ketorolac (TORADOL) injection 15 mg   . lidocaine (ASPERCREME) 4 % patch 2 patch   . magnesium hydroxide (MILK OF MAGNESIA) suspension 30 mL   . menthol (CEPACOL) lozenge 3 mg   . multivitamin tablet 1 tablet   . ondansetron (ZOFRAN) injection 4 mg   . oxyCODONE (OXYCONTIN) ER tablet 20 mg   . oxyCODONE (ROXICODONE) tablet 10 mg   . oxyCODONE (ROXICODONE) tablet 5 mg   . polyethylene glycol (MIRALAX) packet 17 g   . psyllium (METAMUCIL) 58.12 % packet 1 packet   . senna (SENOKOT) tablet 17.2 mg   . sodium chloride 0.9% infusion   . tiZANidine (ZANAFLEX) tablet 4 mg   . zinc sulfate capsule 220 mg (50 mg elemental zinc)   . zolpidem (AMBIEN) tablet 5 mg       Assessment:   38 year old female 4 Days Post-Op s/p above procedures. Patient doing well and progressing appropriately.   - Acute Blood Loss Anemia with component of hemodilution    Plan:   - PT: OOB with LSO brace  - Antibiotics: complete peri-operative   - DVT Prophylaxis: no pharmacologic prophylaxis; SCDs/TED hose, ambulation  - Pain Medication: PO with IVBT  - Diet: regular  - Post op imaging: obtained and reviewed  - Drain: none  - Other: aggressive incentive spirometry  - Dispo: DC home today    Ethlyn DanielsJacob Jshaun Abernathy, MD  Ortho Spine Fellow  PID [phone]      Please page the Orthopaedic Spine Surgery Team with questions or concerns based on patient location:     Established spine floor patients at [place]: Ortho Spine 1 - [phone]      [facility]: Ortho Spine 2 - [phone]      New spine consultations not yet followed by spine team:    [facility] on call for "SPINE CONSULT" call schedule  (Orthopaedic Spine versus Neurosurgery)   If Orthopaedic Spine is on call please page the PRIMARY CONSULT RESIDENT on call:   [place]: ORTHOPEDICS/TH   [place]: ORTHOPEDICS/HC

## 2019-01-07 NOTE — Interdisciplinary (Signed)
Discussed AVS with pt. Pt verbalized understanding.

## 2019-01-07 NOTE — Plan of Care (Signed)
Problem: Promotion of Health and Safety  Goal: Promotion of Health and Safety  Description  The patient remains safe, receives appropriate treatment and achieves optimal outcomes (physically, psychosocially, and spiritually) within the limitations of the disease process by discharge.    Information below is the current care plan.  Outcome: Progressing  Flowsheets  Taken 01/06/2019 0820 by Darliss Ridgel, RN  Patient /Family stated Goal: pain control  Taken 01/07/2019 0114 by Dutch Quint, RN  Guidelines: Inpatient Nursing Guidelines  Individualized Interventions/Recommendations #1: Patient is independent with activities and is encouraged to notify staff when in need of assistance.  Individualized Interventions/Recommendations #2 (if applicable): Bisacodyl suppository administered as requested for no BM since surgery. Patient became frustrated with inability to pass stool. Encouraged patient to ambulate in hallway to promote GI motility. Patient able to pass medium sized BM and reports feeling of GI relief.  Individualized Interventions/Recommendations #3 (if applicable): Vital signs stable. Med Surg level of care maintained.  Individualized Interventions/Recommendations #4 (if applicable): Surgial sites free of s/s of infection. Dressings clean, dry and intact. Ice packs given for comfort.  Individualized Interventions/Recommendations #5 (if applicable): Pain medications given as requested and as scheduled with adequate pain relief.  Outcome Evaluation (rationale for progressing/not progressing) every shift: Fall precautions maintained - Bed in low and locked position, call light and needed items within reach, nonskid footwear applied when out of bed. Patient able to independently don and doff LSO brace and utilized FWW appropriately when ambulating.

## 2019-01-07 NOTE — Interdisciplinary (Signed)
01/07/19 1030   Discharge Plan   Living Arrangements * Alone  (Father will stay with patient for two weeks.)   Patient/Family/Other Engaged in Discharge Planning * Yes   Name, Relationship and Phone Number of Person Engaged in the Discharge Plan Father, Dorismar Chay [phone]   Family/Caregiver's Assessed for * Readiness, willingness, and ability to provide or support self-management activities   Respite Care * Not Applicable   Patient/Family/Other Are In Agreement With Discharge Plan * Yes   Patient Has Decision Making Capacity * Yes   Verified phone number for DC location * Yes   Verified address for DC location * Yes   Patient/Family/Legal/Surrogate Decision Maker Has Been Given a List Options And Choice In The Selection of Post-Acute Care Providers * Yes   CM discussed the following with pt, and/or family, and/or DPOA If patient has chosen [place] or [facility] for post-acute care, they were informed that we partner with, and have a financial interest in these organizations   Patient's Top 3 Discharge [place] * Not Applicable   If Medicare or Medicare Managed Care: Important Message from Medicare Given   If Medicare or Medicare Managed Care: Important Message from [place] Provided to Patient Not Applicable   Discharge Planning Needs   Actions Needed for Discharge Transportation (family)   Planned living arrangements after discharge: Patient will return to her home in [place] with her father to assist for two weeks and HHPT/RN   Primary family / caregiver name and contact information: Father   Advance Directive: No   Does this patient have CM discharge planning needs? Yes   CM Needs Met? Yes   Does this patient have SW discharge planning needs? No   Discharge Transportation   Transportation * Family/Friend   Transportation Arrangement Details * Father, Chantale Leugers [phone]      Final Discharge Destination/Services   Final Discharge Destination/Services * Home Health;Home   Home Health/Home [place]  [phone] will contact you in 1-2 days to schedule a home nursing and physical therapy visit. Please call them when you get home to coonfirm schedule.    Phone * [place]  [phone]   Home Details   Home Details Private residence in [place]   CM DC Note; Received MD order to dc patient home with HHRN/PT. Spoke with patient on phone and she agreed with dc home with [facility] HHRN/PT and said her father would be driving her home. [facility] notified via [place] . Fanny Skates RN CM

## 2019-01-08 ENCOUNTER — Encounter (INDEPENDENT_AMBULATORY_CARE_PROVIDER_SITE_OTHER): Payer: Self-pay | Admitting: Orthopaedic Surgery of the Spine

## 2019-01-09 ENCOUNTER — Other Ambulatory Visit: Payer: Self-pay

## 2019-01-09 ENCOUNTER — Telehealth (HOSPITAL_BASED_OUTPATIENT_CLINIC_OR_DEPARTMENT_OTHER): Payer: Self-pay

## 2019-01-09 NOTE — Telephone Encounter (Addendum)
Transitional Telephonic Nurse (TTN) Post Discharge Manual Follow-Up Telephone Encounter  Inpatient Encounter CSN: [id number] Admission Date: 2019-01-03 Discharge Date: 2019-01-07  Autocall Status: No Response Call Date: 2019-01-08 Contact Number: [phone]  Primary Diagnosis: CHRONIC BILATERAL LOW BACK PAIN WITH BILATERAL SCIATICA  Clinician: Selena Lesser BSN, RN  Phone: [phone]  Email: [email or web address]      2019-01-09 09:44:55 - Pending Resolution; No Answer/Voicemail 1st Attempt;      2019-01-09 09:45:53 - Pending Resolution;      2019-01-09 14:49:41 - Pending Resolution; No Answer/2nd Attempt      2019-01-09 16:34:15 - Patient Reached - All Issues Resolved; Encounter Closed  Contacted: Patient    Symptoms Issues/Concerns: Expected Symptoms  Interventions: Educated Patient    Medication Issues/Concerns: Administration Concerns, Other  Interventions: Educated Patient    Appointment Issues/Concerns: No PCP Appointment Scheduled  Interventions: Offered to Schedule/Reschedule; Patient Declined    Wound, Incision, Lines and/or Drains Issues/Concerns: Wound; Dressing Questions  Interventions: Educated Patient    Discharge Issues/Concerns: Bathing; Activities of Daily Living  Interventions: Educated Patient  [phone]:  TTN made first attempt to reach patient.  Left voicemail with TTN callback information on patient's phone and parent's home number.    1449: TTN made second attempt to reach patient.  Left voicemail with TTN callback information.    1600: TTN reached patient. Pt is doing "ok". She is having pain that she treats with the Oxycodone/Oxycontin. Pt also taking Zanaflex. TTN reviewed all of patient's medications. Pt was not aware that Percocet was different from Oxycodone and that she was to discontinue it. Pt also unaware to stop taking Zofran and Flexeril. TTN is taking other sedating medications such as Ambien, Xanax and Valium that she has been on in the past and per AVS can continue. TTN  educated on staggering these medications and keeping them to a minimum due to their similar effects.  Pt verbalized understanding. TTN also notified Dr. Christa See via routing message of amount of medications. Pt has a f/u appointment with Dr. Christa See on May 13. She has been contacted by [facility]. Pt has no new or worsening symptoms. She has no questions regarding transportation. Her father is staying with her for her recovery. Reviewed AVS including activity and bathing restrictions, diet/hydration, Colace for s/s of constipation, all medications, f/u appointment with Dr. Christa See and MD/911 criteria. Pt had additional questions about showering with or without dressing. TTN educated per AVS instructions for wound care/showering. Pt was very appreciative of TTN call.

## 2019-01-09 NOTE — Telephone Encounter (Signed)
Outreach call to f/u on Follow Up;Recent IP Discharge(Chronic bilateral low back pain with bilateral sciatica)    No answer received.  Left message, including direct contact information, requesting call back.    PLAN:  Will continue to follow up if no call back today.

## 2019-01-09 NOTE — Telephone Encounter (Signed)
From: [name]  To: [name], MD  Sent: 01/08/2019 1:15 PM PDT  Subject: 2-Procedural Question    Hi,    Could I get would care directions sent to my email? I wasn't sure on the specifics with showering and such.    Thanks,  [name]

## 2019-01-10 NOTE — Telephone Encounter (Signed)
Outreach call to f/u on Follow Up(Chronic bilateral low back pain)    No answer received.  Left message, including direct contact information, requesting call back

## 2019-01-12 NOTE — Anesthesia Postprocedure Evaluation (Signed)
Anesthesia Post Note    Patient: Haley Barrett    Procedure(s) Performed: Procedure(s):  Far lateral interbody fusion  Removal of retained spinal hardware lumbar 4-5, and posterior instrumented fusion lumbar 4-5      Final anesthesia type: General    Patient location: PACU    Post anesthesia pain: adequate analgesia    Mental status: awake, alert  and oriented    Airway Patent: Yes    Last Vitals:   Vitals Value Taken Time   BP 95/54 01/03/2019  5:45 PM   Temp 36.2 C 01/03/2019  5:15 PM   Pulse 75 01/03/2019  5:45 PM   Resp 16 01/03/2019  5:45 PM   SpO2 97 % 01/03/2019  5:45 PM        Post vital signs: stable    Hydration: adequate    N/V:no    Anesthetic complications: no    Plan of care per primary team.

## 2019-01-13 NOTE — Telephone Encounter (Signed)
You're welcome.  Sorry, I've been off for a few days.  Yes I was a bit concerned with all the overlapping meds.  Thanks for getting back to me.  Haley Barrett

## 2019-01-16 ENCOUNTER — Encounter (INDEPENDENT_AMBULATORY_CARE_PROVIDER_SITE_OTHER): Payer: Self-pay | Admitting: Orthopaedic Surgery of the Spine

## 2019-01-16 ENCOUNTER — Telehealth (HOSPITAL_BASED_OUTPATIENT_CLINIC_OR_DEPARTMENT_OTHER): Payer: Self-pay | Admitting: Orthopaedic Surgery of the Spine

## 2019-01-16 DIAGNOSIS — Z981 Arthrodesis status: Secondary | ICD-10-CM

## 2019-01-16 NOTE — Telephone Encounter (Signed)
noted 

## 2019-01-16 NOTE — Telephone Encounter (Signed)
Jae Dire from [facility] calling to inform Dr. Christa See they have started physical therapist on patient since Wednesday 05/06 and they will continue PT once a week or as insurance allows.

## 2019-01-18 ENCOUNTER — Ambulatory Visit (INDEPENDENT_AMBULATORY_CARE_PROVIDER_SITE_OTHER): Payer: Medicaid Other | Admitting: Orthopaedic Surgery of the Spine

## 2019-01-18 ENCOUNTER — Encounter (INDEPENDENT_AMBULATORY_CARE_PROVIDER_SITE_OTHER): Payer: Self-pay | Admitting: Orthopaedic Surgery of the Spine

## 2019-01-18 ENCOUNTER — Inpatient Hospital Stay (INDEPENDENT_AMBULATORY_CARE_PROVIDER_SITE_OTHER): Admit: 2019-01-18 | Discharge: 2019-01-18 | Disposition: A | Discharge status: Home Health | Payer: Medicaid Other

## 2019-01-18 VITALS — BP 107/76 | HR 79 | Temp 97.9°F | Ht 70.0 in | Wt 174.0 lb

## 2019-01-18 DIAGNOSIS — M4316 Spondylolisthesis, lumbar region: Secondary | ICD-10-CM

## 2019-01-18 DIAGNOSIS — Z981 Arthrodesis status: Secondary | ICD-10-CM

## 2019-01-18 DIAGNOSIS — M48061 Spinal stenosis, lumbar region without neurogenic claudication: Secondary | ICD-10-CM

## 2019-01-18 DIAGNOSIS — Z09 Encounter for follow-up examination after completed treatment for conditions other than malignant neoplasm: Secondary | ICD-10-CM

## 2019-01-18 MED ORDER — ACETAMINOPHEN 325 MG PO TABS
975.0000 mg | ORAL_TABLET | Freq: Three times a day (TID) | ORAL | 1 refills | Status: AC
Start: 2019-01-18 — End: 2020-01-13

## 2019-01-18 MED ORDER — OXYCODONE HCL 5 MG OR TABS
5.0000 mg | ORAL_TABLET | ORAL | 0 refills | Status: DC | PRN
Start: 2019-01-18 — End: 2019-03-03

## 2019-01-18 NOTE — Progress Notes (Signed)
Spine Surgery Post Operative Visit    DATE OF SERVICE:  01/03/2019    PROCEDURE PERFORMED:    1.  Far lateral interbody fusion L4-L5 with PEEK cage (NuVasive),  Formagraft, rhBMP-2.   2.  Placement of biomechanical vertebral structural device consisting  of polyetheretherketone cage.   3.  Interpretation of intraoperative fluoroscopy.  4.  Interpretation of intraoperative neuromonitoring (NuVasive and  evoked potentials).     PROCEDURE PERFORMED:    1.  Removal of retained instrumentation consisting of screws and rods  with hinge device, L4-L5.   2.  Revision laminectomies, partial L4 and L5, with bilateral  foraminotomies, L4-L5.   3.  Revision Smith-Petersen osteotomy, L4-L5.  4.  Decompression of cauda equina and exploration of nerve roots,  bilateral L5-L4, with resection of extensive epidural scar tissue.   5.  Posterior spinal instrumentation and fusion, L4-L5, with screws  and rods (DePuy Synthes), local bone autograft, DBM (Accell),  beta-tricalcium phosphate (MagnetOs).   6.  Interpretation of intraoperative fluoroscopy.  7.  Interpretation of intraoperative neuromonitoring.  22-modifier due to complex revision resulting in increased time and difficulty    Subjective: The patient returns today now approximately 2 weeks following the above procedures . The date of surgery was 01/03/19. The patient is doing well and has no complaints. The patient rates her pain at 4 today. The patient denies problems with the incision or any fevers, chills or night sweats. She is wearing the brace and tolerating it well. Reports she still has some residual pain and weakness in the left lower extremity but otherwise denies any significant changes or acute concerns. Denies any bowel/bladder dysfunction/saddle anesthesia.    Objective: Examination of the incision reveals a healing without evidence of erythema or infection. Sensation is intact to light touch. Motor strength is 5/5 in both upper and lower extremities. Reflexes are  symmetric. No pathologic reflexes.     Imaging: Films ordered and viewed today show that the instrumentation is intact and the spine remains in good alignment.     Impression: Patient doing well status post the above procedures, 2 weeks out    Plan: We have again discussed post-operative instructions. Wound care instructions have again been provided. We will transition the patient to outpatient physical therapy. All of the patient's questions were answered. We will plan on seeing the patient back in the office in 6 weeks. If any problems develop prior to that time the patient will let us know and we can see her right away.

## 2019-01-20 ENCOUNTER — Telehealth (HOSPITAL_BASED_OUTPATIENT_CLINIC_OR_DEPARTMENT_OTHER): Payer: Self-pay | Admitting: Orthopaedic Surgery of the Spine

## 2019-01-20 NOTE — Telephone Encounter (Signed)
Open in Error.

## 2019-01-27 ENCOUNTER — Encounter (HOSPITAL_COMMUNITY): Payer: Self-pay | Admitting: Orthopaedic Surgery of the Spine

## 2019-02-04 ENCOUNTER — Encounter (INDEPENDENT_AMBULATORY_CARE_PROVIDER_SITE_OTHER): Payer: Self-pay | Admitting: Orthopaedic Surgery of the Spine

## 2019-02-06 ENCOUNTER — Encounter (HOSPITAL_BASED_OUTPATIENT_CLINIC_OR_DEPARTMENT_OTHER): Payer: Self-pay | Admitting: Nurse Practitioner

## 2019-02-06 ENCOUNTER — Telehealth (INDEPENDENT_AMBULATORY_CARE_PROVIDER_SITE_OTHER): Payer: Self-pay | Admitting: Orthopaedic Surgery of the Spine

## 2019-02-06 ENCOUNTER — Ambulatory Visit: Payer: Medicaid Other | Attending: Nurse Practitioner | Admitting: Nurse Practitioner

## 2019-02-06 VITALS — BP 105/70 | HR 81 | Temp 98.5°F | Resp 18 | Ht 70.0 in | Wt 170.0 lb

## 2019-02-06 DIAGNOSIS — Z09 Encounter for follow-up examination after completed treatment for conditions other than malignant neoplasm: Secondary | ICD-10-CM

## 2019-02-06 DIAGNOSIS — B999 Unspecified infectious disease: Secondary | ICD-10-CM

## 2019-02-06 MED ORDER — CLINDAMYCIN HCL 300 MG OR CAPS
300.00 mg | ORAL_CAPSULE | Freq: Three times a day (TID) | ORAL | 0 refills | Status: DC
Start: 2019-02-06 — End: 2019-11-02

## 2019-02-06 NOTE — Telephone Encounter (Signed)
From: [name]  To: [name], MD  Sent: 02/04/2019 8:87 PM PDT  Subject: 1-Non Urgent Medical Advice    Hi Doctor Z,    After a shower today, I noticed some puss coming out of one of my smaller incisions. I put pressure on it and got out whatever came out. It was not painful. I put some would cleanse and neosporin and a band aid on that one and noticed some spots trying to open up and look a little infected on the top and bottom of my big incision so did the same with that. I just wanted to see if I could get someone to look at it or any advice on it?    Thanks,  Haley Barrett

## 2019-02-06 NOTE — Progress Notes (Signed)
Spine Surgery Post Operative Visit    DATE OF SERVICE:  01/03/2019    PROCEDURE PERFORMED:    1.  Far lateral interbody fusion L4-L5 with PEEK cage (NuVasive),  Formagraft, rhBMP-2.   2.  Placement of biomechanical vertebral structural device consisting  of polyetheretherketone cage.   3.  Interpretation of intraoperative fluoroscopy.  4.  Interpretation of intraoperative neuromonitoring (NuVasive and  evoked potentials).     PROCEDURE PERFORMED:    1.  Removal of retained instrumentation consisting of screws and rods  with hinge device, L4-L5.   2.  Revision laminectomies, partial L4 and L5, with bilateral  foraminotomies, L4-L5.   3.  Revision Smith-Petersen osteotomy, L4-L5.  4.  Decompression of cauda equina and exploration of nerve roots,  bilateral L5-L4, with resection of extensive epidural scar tissue.   5.  Posterior spinal instrumentation and fusion, L4-L5, with screws  and rods (DePuy Synthes), local bone autograft, DBM (Accell),  beta-tricalcium phosphate (MagnetOs).   6.  Interpretation of intraoperative fluoroscopy.  7.  Interpretation of intraoperative neuromonitoring.  22-modifier due to complex revision resulting in increased time and difficulty    Subjective: The patient returns today now approximately 2 weeks following the above procedures . The date of surgery was 01/03/19. The patient is doing well and has no complaints. The patient rates her pain at 4 today. The patient denies problems with the incision or any fevers, chills or night sweats. She is wearing the brace and tolerating it well. Reports she still has some residual pain and weakness in the left lower extremity but otherwise denies any significant changes or acute concerns. Denies any bowel/bladder dysfunction/saddle anesthesia.    Interval HIstory: noticed scant dc out of the posterior lateral incision. Here for a wound check     Objective: Examination of the incision reveals a healing without evidence of erythema or infection.  Sensation is intact to light touch. Motor strength is 5/5 in both upper and lower extremities. Reflexes are symmetric. No pathologic reflexes. Scan ss dc from the posterolateral incision.   Imaging: none    Impression: Patient doing well status post the above procedures, scan SS dc no evidence of infection. Steri strips applied.    Plan: keep steri strips in place. If dc becomes more copious will follow up. Antibiotics prescribed for prophylaxis. clinda since she is allergic to sulfa drugs.. All of the patient's questions were answered. We will plan on seeing the patient back in the office in 6 weeks. If any problems develop prior to that time the patient will let us know and we can see her right away.

## 2019-02-06 NOTE — Telephone Encounter (Signed)
Pt called and stated that on Saturday 02/04/19 she was taking a shower and on the bottom of the incision area she noticed "puss" coming out at a yellow color pt states she can still push on the area and yellowish fluid comes out. Area slightly red and itchy for a couple days. Pt is s/p 01/03/19. Please advise.  Thank you,

## 2019-02-06 NOTE — Telephone Encounter (Signed)
Pt has appt today 3pm Haley Barrett.

## 2019-02-06 NOTE — Telephone Encounter (Signed)
DOS 01/03/19 Removal of retained spinal hardware lumbar 4-5, and posterior instrumented fusion lumbar 4-5     Spoke to pt and made appt for 3pm Irina today. She denied fever.

## 2019-02-15 ENCOUNTER — Encounter (INDEPENDENT_AMBULATORY_CARE_PROVIDER_SITE_OTHER): Payer: Self-pay | Admitting: Hospitalist

## 2019-02-15 DIAGNOSIS — M48061 Spinal stenosis, lumbar region without neurogenic claudication: Secondary | ICD-10-CM

## 2019-02-16 NOTE — Telephone Encounter (Signed)
From: [name]  To: Ross Marcus, MD  Sent: 02/15/2019 8:53 PM PDT  Subject: 20-Other    Hi Dr. Reather Littler,    I was wondering if I could get my rx for ambien refilled. I'm still having trouble sleeping at night due to pain.    Thanks,  [name]

## 2019-02-16 NOTE — Telephone Encounter (Signed)
Medication Requested: zolpidem (AMBIEN) 5 MG tablet       Date controlled substance last refilled: 12/23/2018  Amount refilled: 30  Last office visit with PCP: 12/26/2018  Last office visit this clinic: 12/26/2018  Last MyChart video visit: 12/26/2018  Last Telephone visit: Visit date not found  Next office visit with PCP: Visit date not found   (should be every 3 months for opiates)  Next office visit this clinic: Visit date not found    Last CURES report documented (should be every 4 months):10/21/2018 Adams-Renteria, Jody L, NP  Controlled Substance agreement signed? No  Last urine drug screen (within 1 year): No results found for: AMPCLASS, BARBCLASS, BENZYLCGNN, BENZDIAZCL, METHADONE, OPIATESCL, OXY, PHENCYCLDN, TETCANNABIN, UDI      Resident providers: Please document if medication approved and route to Attending Provider to e-prescribe to Pharmacy.      Routing to front desk to secure re establish care appt.

## 2019-02-20 NOTE — Telephone Encounter (Signed)
Unable offer reestablish care due to our provider panel full and closed to [facility]

## 2019-02-21 MED ORDER — ZOLPIDEM TARTRATE 5 MG OR TABS
5.0000 mg | ORAL_TABLET | Freq: Every evening | ORAL | 0 refills | Status: DC | PRN
Start: 2019-02-21 — End: 2019-03-29

## 2019-02-21 NOTE — Telephone Encounter (Signed)
Reviewed prior refills and records as well as CURES report - no abnormal prescription patterns but patient is on multiple controlled substances currently.     Can prescribe one month course but would not fill beyond this as Zolpidem (Ambien) is really a best utilized as a short-term medication. Longer term use risks development of dependence.     In order to receive further refills should schedule a follow up visit/re-establish care visit. Ultimately she would be better served transitioning to a different agent for insomnia.  I understand patient is [place] so this may not be possible in this clinic. She should follow with a physician for her pain and insomnia to try to come up with a plan to transition from controlled substances.

## 2019-02-21 NOTE — Telephone Encounter (Signed)
Re routing to PCP and cross covers for assistance.

## 2019-02-22 ENCOUNTER — Encounter (INDEPENDENT_AMBULATORY_CARE_PROVIDER_SITE_OTHER): Payer: Self-pay | Admitting: Hospitalist

## 2019-02-22 NOTE — Telephone Encounter (Signed)
Patient notified via mychart

## 2019-02-22 NOTE — Telephone Encounter (Signed)
LVM for patient to schedule reestablish care appointment.

## 2019-02-22 NOTE — Telephone Encounter (Signed)
From: [name]  To: Ross Marcus, MD  Sent: 02/22/2019 12:48 PM PDT  Subject: [place] thank you.  ----- Message -----  From: Garlan Fillers  Sent: 02/22/2019 8:59 AM PDT  To: [name]  Subject: RE: 20-Other  Denna Haggard,  A prescription refill for the Ambien has been sent to your pharmacy. Dr Reather Littler has completed his residency here in our clinic. Please see message below from our covering physician Dr Devin Going:  Can prescribe one month course but would not fill beyond this as Zolpidem (Ambien) is really a best utilized as a short-term medication. Longer term use risks development of dependence.   In order to receive further refills should schedule a follow up visit/re-establish care visit. Ultimately she would be better served transitioning to a different agent for insomnia. I understand patient is Estevan Ryder so this may not be possible in this clinic. She should follow with a physician for her pain and insomnia to try to come up with a plan to transition from controlled substances.    Best regards,  Humberto Leep.      ----- Message -----   From: [name]   Sent: 02/15/2019 8:53 PM PDT   To: Ross Marcus, MD  Subject: 20-Other    Hi Dr. Reather Littler,    I was wondering if I could get my rx for ambien refilled. I'm still having trouble sleeping at night due to pain.    Thanks,  [name]

## 2019-02-27 ENCOUNTER — Encounter (INDEPENDENT_AMBULATORY_CARE_PROVIDER_SITE_OTHER): Payer: Self-pay | Admitting: Orthopaedic Surgery of the Spine

## 2019-02-27 DIAGNOSIS — Z981 Arthrodesis status: Secondary | ICD-10-CM

## 2019-02-28 ENCOUNTER — Ambulatory Visit (INDEPENDENT_AMBULATORY_CARE_PROVIDER_SITE_OTHER): Payer: Self-pay | Admitting: Hospitalist

## 2019-02-28 NOTE — Telephone Encounter (Signed)
Symptom Call          Next office visit:  None    What symptom is the patient experiencing? Patient is calling about back pain and nauseous       Name of PCP Provider: Ross Marcus   Insurance Coverage Verified: Active- in network  Last office visit: 12/26/2018    Who is reporting the symptoms? Incoming call from patient    Is this a new or ongoing symptom? new  Estimated time since experiencing symptom(s)? Ongoing    Best way to contact patient: [phone] (mobile)   Alternative communication method: [phone] (mobile)   @HOMEPHONE @  @WORKPHONE @            *Select a method of escalation for this call:     1.   2. *Cold transfer to triage nurse to extension #[id number]

## 2019-02-28 NOTE — Telephone Encounter (Signed)
PROVIDER ACTION REQUESTED: NO, FYI Only  RN ACTION: Advice given  Reason for Call: Back Pain  underwent 2 back surgeries in the past 6 months, continues to have moderate mid back pain, will have appt with surgeon on 03/01/19 but also requesting appt with PCP to check for labs and see if back pain could be unrelated to surgery, as pt also has occasional nausea without vomiting.   Pt's insurance w/ [employer] ending 03/08/19, requesting wellness check/routine labs.  Disposition: See PCP When Office is Open (Within 3 Days)     Appt scheduled:   Yes  Future Appointments   Date Time Provider Department Center   03/01/2019  1:00 PM [facility] [place]   03/01/2019  1:40 PM Zlomislic, Alger MemosVinko, MD [facility]   03/01/2019  1:40 PM [facility]   03/03/2019  2:20 PM Jenks, Debbra RidingJeffrey Daniel, [facility]     Reason for Disposition  . [1] MODERATE back pain (e.g., interferes with normal activities) AND [2] present > 3 days    Additional Information  . Negative: Passed out (i.e., lost consciousness, collapsed and was not responding)  . Negative: Shock suspected (e.g., cold/pale/clammy skin, too weak to stand, low BP, rapid pulse)  . Negative: Sounds like a life-threatening emergency to the triager  . Negative: Major injury to the back (e.g., MVA, fall > 10 feet or 3 meters, penetrating injury, etc.)  . Negative: Followed a tailbone injury  . Negative: [1] Pain in the upper back over the ribs (rib cage) AND [2] radiates (travels, goes) into chest  . Negative: [1] Pain in the upper back over the ribs (rib cage) AND [2] worsened by coughing (or clearly increases with breathing)  . Negative: Back pain during pregnancy  . Negative: Pain mainly in flank (i.e., in the side, over the lower ribs or just below the ribs)  . Negative: [1] SEVERE back pain (e.g., excruciating) AND [2] sudden onset AND [3] age > 9060  . Negative: [1] Unable to urinate (or only a few drops) > 4 hours AND [2] bladder feels very full  (e.g., palpable bladder or strong urge to urinate)  . Negative: [1] Loss of bladder or bowel control (urine or bowel incontinence; wetting self, leaking stool) AND [2] new onset  . Negative: Numbness in groin or rectal area (i.e., loss of sensation)  . Negative: [1] SEVERE abdominal pain AND [2] present > 1 hour  . Negative: [1] Abdominal pain AND [2] age > 1460  . Negative: Weakness of a leg or foot (e.g., unable to bear weight, dragging foot)  . Negative: Unable to walk  . Negative: Patient sounds very sick or weak to the triager  . Negative: [1] SEVERE back pain (e.g., excruciating, unable to do any normal activities) AND [2] not improved 2 hours after pain medicine  . Negative: [1] Pain radiates into the thigh or further down the leg AND [2] both legs  . Negative: [1] Fever > 100.0 F (37.8 C) AND [2] flank pain (i.e., in side, below ribs and above hip)  . Negative: [1] Pain or burning with passing urine (urination) AND [2] flank pain (i.e., in side, below ribs and above hip)  . Negative: Numbness in a leg or foot (i.e., loss of sensation)  . Negative: [1] Numbness in an arm or hand (i.e., loss of sensation) AND [2] upper back pain  . Negative: High-risk adult (e.g., history of cancer, HIV,  or IV drug abuse)  . Negative: [1] Fever AND [2] no symptoms of UTI  (Exception: has generalized muscle pains, not localized back pain)  . Negative: Rash in same area as pain (may be described as "small blisters")  . Negative: Blood in urine (red, pink, or tea-colored)    Answer Assessment - Initial Assessment Questions  1. ONSET: "When did the pain begin?"       07/2018  2. LOCATION: "Where does it hurt?" (upper, mid or lower back)      Lower back  3. SEVERITY: "How bad is the pain?"  (e.g., Scale 1-10; mild, moderate, or severe)    - moderate pain.   4. PATTERN: "Is the pain constant?" (e.g., yes, no; constant, intermittent)       Constant.   5. RADIATION: "Does the pain shoot into your legs or elsewhere?"      no  6. CAUSE:   "What do you think is causing the back pain?"       S/p back surgery x2  7. BACK OVERUSE:  "Any recent lifting of heavy objects, strenuous work or exercise?"      No overuse, participating in at home PT.   8. MEDICATIONS: "What have you taken so far for the pain?" (e.g., nothing, acetaminophen, NSAIDS)      Tylenol.   9. NEUROLOGIC SYMPTOMS: "Do you have any weakness, numbness, or problems with bowel/bladder control?"      no  10. OTHER SYMPTOMS: "Do you have any other symptoms?" (e.g., fever, abdominal pain, burning with urination, blood in urine)        Nausea.   11. PREGNANCY: "Is there any chance you are pregnant?" (e.g., yes, no; LMP)        Not pregnant.    Protocols used: BACK PAIN-A-AH

## 2019-03-01 ENCOUNTER — Other Ambulatory Visit
Admission: RE | Admit: 2019-03-01 | Discharge: 2019-03-01 | Disposition: A | Discharge status: Home / Self Care | Payer: Medicaid Other | Attending: Nurse Practitioner | Admitting: Nurse Practitioner

## 2019-03-01 ENCOUNTER — Ambulatory Visit (INDEPENDENT_AMBULATORY_CARE_PROVIDER_SITE_OTHER): Payer: Medicaid Other | Admitting: Orthopaedic Surgery of the Spine

## 2019-03-01 ENCOUNTER — Inpatient Hospital Stay (INDEPENDENT_AMBULATORY_CARE_PROVIDER_SITE_OTHER): Admit: 2019-03-01 | Discharge: 2019-03-01 | Disposition: A | Discharge status: Home Health | Payer: Medicaid Other

## 2019-03-01 ENCOUNTER — Encounter (INDEPENDENT_AMBULATORY_CARE_PROVIDER_SITE_OTHER): Payer: Self-pay | Admitting: Orthopaedic Surgery of the Spine

## 2019-03-01 VITALS — Temp 98.5°F | Ht 70.0 in | Wt 170.0 lb

## 2019-03-01 DIAGNOSIS — Z09 Encounter for follow-up examination after completed treatment for conditions other than malignant neoplasm: Secondary | ICD-10-CM

## 2019-03-01 DIAGNOSIS — Z791 Long term (current) use of non-steroidal anti-inflammatories (NSAID): Secondary | ICD-10-CM | POA: Insufficient documentation

## 2019-03-01 DIAGNOSIS — Z981 Arthrodesis status: Secondary | ICD-10-CM

## 2019-03-01 DIAGNOSIS — K219 Gastro-esophageal reflux disease without esophagitis: Secondary | ICD-10-CM | POA: Insufficient documentation

## 2019-03-01 DIAGNOSIS — M4316 Spondylolisthesis, lumbar region: Secondary | ICD-10-CM

## 2019-03-01 LAB — COMPREHENSIVE METABOLIC PANEL, BLOOD
ALT (SGPT): 24 U/L (ref 0–33)
AST (SGOT): 31 U/L (ref 0–32)
Albumin: 4.3 g/dL (ref 3.5–5.2)
Alkaline Phos: 71 U/L (ref 35–140)
Anion Gap: 11 mmol/L (ref 7–15)
BUN: 8 mg/dL (ref 6–20)
Bicarbonate: 24 mmol/L (ref 22–29)
Bilirubin, Tot: 0.35 mg/dL (ref <1.2)
Calcium: 9.9 mg/dL (ref 8.5–10.6)
Chloride: 101 mmol/L (ref 98–107)
Creatinine: 0.78 mg/dL (ref 0.51–0.95)
GFR: >60 mL/min
Glucose: 101 mg/dL — ABNORMAL HIGH (ref 70–99)
Potassium: 4.1 mmol/L (ref 3.5–5.1)
Sodium: 136 mmol/L (ref 136–145)
Total Protein: 7.4 g/dL (ref 6.0–8.0)

## 2019-03-01 LAB — CBC WITH DIFF, BLOOD
ANC-Automated: 2.4 10*3/uL (ref 1.6–7.0)
Abs Basophils: 0 10*3/uL (ref <0.1)
Abs Eosinophils: 0.1 10*3/uL (ref 0.1–0.5)
Abs Lymphs: 3 10*3/uL (ref 0.8–3.1)
Abs Monos: 0.6 10*3/uL (ref 0.2–0.8)
Basophils: 1 %
Eosinophils: 2 %
Hct: 39.8 % (ref 34.0–45.0)
Hgb: 12.6 gm/dL (ref 11.2–15.7)
Lymphocytes: 49 %
MCH: 29.2 pg (ref 26.0–32.0)
MCHC: 31.7 g/dL — ABNORMAL LOW (ref 32.0–36.0)
MCV: 92.1 um3 (ref 79.0–95.0)
MPV: 8.6 fL — ABNORMAL LOW (ref 9.4–12.4)
Monocytes: 9 %
Plt Count: 319 10*3/uL (ref 140–370)
RBC: 4.32 10*6/uL (ref 3.90–5.20)
RDW: 12.5 % (ref 12.0–14.0)
Segs: 40 %
WBC: 6.1 10*3/uL (ref 4.0–10.0)

## 2019-03-01 LAB — RANDOM URINE MICROALB/CREAT RATIO PANEL
Creatinine, Urine: 11 mg/dL — ABNORMAL LOW (ref 29–226)
MALB/CR Ratio Random: UNDETERMINED mcg/mgCr (ref <30)
Microalbumin, Urine: <1.2 mg/dL (ref <2.0)

## 2019-03-02 NOTE — Progress Notes (Signed)
INTERNAL MEDICINE OUTPATIENT CLINIC NOTE     Date:  03/03/19     History of Present Illness:     Haley Barrett is a 38 year old woman with ADD and spinal stenosis/degenerative disease who presents for follow-up of low back pain with radiculopathy.     Last seen by Dr. Reather Littler 12/26/2018    Given CDC recommendations and this patients age and multiple co-morbidities, the OV was converted to a Video visit.    Underwent 07/28/2018 L4-L5 fusion in [place] followed by hardware removal and repeat fusion 01/03/2019.  Back pain has improved but still having some midline "burning" back pain which has improved over the past couple days.    Reviewed labs from 03/01/2019 which were all normal.      Past Medical and Surgical History:  Past Medical History:   Diagnosis Date   . ADD (attention deficit disorder)    . Clavicular fracture      Past Surgical History:   Procedure Laterality Date   . BACK SURGERY  07/28/2018    In [place]   . HUMERUS FRACTURE SURGERY Left 1993   . ------------OTHER-------------  38 yo    humerus ORIF      Medications:  acetaminophen (TYLENOL) 325 MG tablet, Take 3 tablets (975 mg) by mouth every 8 hours.  ALPRAZolam (XANAX) 0.25 MG tablet, take 1 tablet by mouth once daily if needed  Amphetamine-Dextroamphetamine (ADDERALL PO), as needed.  clindamycin (CLEOCIN T) 1 % solution, Apply 1 Application topically 2 times daily. Use a small amount as directed  clindamycin (CLEOCIN) 300 MG capsule, Take 1 capsule (300 mg) by mouth 3 times daily.  diazepam (VALIUM) 5 MG tablet, Take 1 tablet (5 mg) by mouth every 6 hours as needed for Insomnia or Muscle Spasms.  drospirenone-ethinyl estradiol (YAZ) 3-0.02 MG tablet, Take 1 tablet by mouth daily.  tiZANidine (ZANAFLEX) 4 MG tablet, Take 1 tablet (4 mg) by mouth every 6 hours as needed (spasm).  zolpidem (AMBIEN) 5 MG tablet, Take 1 tablet (5 mg) by mouth nightly as needed for Insomnia.      Allergies:  Allergies   Allergen Reactions   . Sulfa Drugs Nausea Only and  Anxiety   . Tramadol Nausea and Vomiting     Social History:  Social History     Socioeconomic History   . Marital status: Single     Spouse name: Not on file   . Number of children: Not on file   . Years of education: Not on file   . Highest education level: Not on file   Occupational History   . Not on file   Tobacco Use   . Smoking status: Never Smoker   . Smokeless tobacco: Never Used   Substance and Sexual Activity   . Alcohol use: Yes     Comment: socially, couple drinks/week    . Drug use: Yes     Types: Marijuana     Comment: edibles   . Sexual activity: Not Currently     Partners: Male     Birth control/protection: Pill   Social Activities of Daily Living Present   . Not on file   Social History Narrative    Single    Psychotherapist - just beginning, in private practice. Went to school in [place].    Born in [place] and moved to [place] 6 years ago    Haley Barrett and history tours     Hobbies: beach volleyball, horse back riding,  exercise      Family History:  No family history of immunodeficiency disorders.  Family History   Problem Relation Name Age of Onset   . Diabetes Paternal Uncle     . Breast Cancer Paternal Aunt     . Breast Cancer Mother  34   . Breast Cancer Paternal Grandmother     . Colon Cancer Neg Hx     . Hypertension Neg Hx     . Cholesterol/Lipid Disorder Neg Hx       Review of Systems:    A complete and comprehensive ROS was completed, and found to be negative except where noted above.      Physical Exam:  Blood pressure (BP): ()/()   Wt Readings from Last 1 Encounters:   03/01/19 77.1 kg (170 lb)     GEN: Well-appearing, non-toxic, NAD  HEENT: NC/AT, EOMI  NECK: Supple  LUNGS: Symmetric expansion  ABD: ND  EXT: Well perfused, no edema  NEURO: A+Ox3, face symmetric, CNs II-XII intact, moves all extremities     Laboratory data: All labs reviewed in EPIC and are notable for:  CBC  Lab Results   Component Value Date    WBC 6.1 03/01/2019    RBC 4.32 03/01/2019    HGB 12.6  03/01/2019    HCT 39.8 03/01/2019    MCV 92.1 03/01/2019    MCHC 31.7 (L) 03/01/2019    RDW 12.5 03/01/2019    PLT 319 03/01/2019    MPV 8.6 (L) 03/01/2019    SEG 40 03/01/2019    LYMPHS 49 03/01/2019    MONOS 9 03/01/2019    EOS 2 03/01/2019    BASOS 1 03/01/2019     CHEMISTRY  Lab Results   Component Value Date    NA 136 03/01/2019    K 4.1 03/01/2019    CL 101 03/01/2019    BICARB 24 03/01/2019    BUN 8 03/01/2019    CREAT 0.78 03/01/2019    GLU 101 (H) 03/01/2019    [place] 9.9 03/01/2019     LFTS  Lab Results   Component Value Date    TBILI 0.35 03/01/2019    AST 31 03/01/2019    ALT 24 03/01/2019    ALK 71 03/01/2019    TP 7.4 03/01/2019    ALB 4.3 03/01/2019     UA: No results found for: COLORUA, APPEARUA, GLUCOSEUA, BILIUA, KETONEUA, SGUA, BLOODUA, PHUA, PROTEINUA, UROBILUA, NITRITEUA, LEUKESTUA, [place], [place], [place], [place], [place], [place]    Radiology: Images viewed by me an are notable for:  No pertinent imaging    Impression and Recommendations: Haley Barrett is a 38 year old woman with ADD and spinal stenosis/degenerative disease who presents for follow-up of low back pain with radiculopathy.     #Back pain: Has spinal stenosis s/p fusion 07/11/2018 with revision 01/03/2019.  On tizanidine '4mg'$  Q6 hours PRN and acetaminophen PRN.      - Continue current regimen   - Trial of diclofenac gel QID PRN  - Follow-up with Orthopedics 03/29/2019 as scheduled     Re-establish care in Resident Clinic if able to stay (she has Estevan Barrett)    I spent 25 minutes preparing for this visit, with the patient, and documenting the visit encounter    Atilano Ina, MD, MPH  Assistant Clinical Professor of Medicine   [facility] [place]    ---------------------(data below generated by Jennings Books, MD)--------------------    Patient Verification & Telemedicine  Consent:    I am proceeding with this evaluation at the direct request of the patient.  I have verified this is the correct patient and  have obtained verbal consent and written consent from the patient/ surrogate to perform this voluntary telemedicine evaluation (including obtaining history, performing examination and reviewing data provided by the patient).   The patient/ surrogate has the right to refuse this evaluation.  I have explained risks (including potential loss of confidentiality), benefits, alternatives, and the potential need for subsequent face to face care. Patient/ surrogate understands that there is a risk of medical inaccuracies given that our recommendations will be made based on reported data (and we must therefore assume this information is accurate).  Knowing that there is a risk that this information is not reported accurately, and that the telemedicine video, audio, or data feed may be incomplete, the patient agrees to proceed with evaluation and holds [place] harmless knowing these risks. In this evaluation, we will be providing recommendations only. The patient/ surrogate has been notified that other healthcare professionals (including students, residents and Metallurgist) may be involved in this audio-video evaluation.   All laws concerning confidentiality and patient access to medical records and copies of medical records apply to telemedicine.  The patient/ surrogate has received the [place] Notice of Privacy Practices.  I have reviewed this above verification and consent paragraph with the patient/ surrogate.  If the patient is not capacitated to understand the above, and no surrogate is available, since this is not an emergency evaluation, the visit will be rescheduled until such time that the patient can consent, or the surrogate is available to consent.    Demographics:   Medical Record #: [id number]   Date: March 03, 2019   Patient Name: Hildagard Sobecki   DOB: 1981-07-02  Age: 38 year old  Sex: female  Location: Home address on file    Evaluator(s):   Elizabelle Fite was evaluated by me today.    Clinic Location: [facility]  [place] [address]

## 2019-03-03 ENCOUNTER — Encounter (INDEPENDENT_AMBULATORY_CARE_PROVIDER_SITE_OTHER): Payer: Self-pay | Admitting: Internal Medicine

## 2019-03-03 ENCOUNTER — Telehealth (INDEPENDENT_AMBULATORY_CARE_PROVIDER_SITE_OTHER): Payer: Medicaid Other | Admitting: Internal Medicine

## 2019-03-03 DIAGNOSIS — G8918 Other acute postprocedural pain: Secondary | ICD-10-CM

## 2019-03-03 MED ORDER — DICLOFENAC SODIUM 1 % EX GEL
1.0000 g | Freq: Four times a day (QID) | TRANSDERMAL | 3 refills | Status: DC
Start: 2019-03-03 — End: 2019-11-04

## 2019-03-03 NOTE — Patient Instructions (Signed)
It was a pleasure seeing you today.  My name is Dr. Atilano Ina and you can contact me if you have any questions.  The clinic number is [phone] and fax number [phone].    It was nice talking with you today, Haley Barrett    For your low back pain you can continue what you are doing and try diclofenac gel 3-4 times as needed

## 2019-03-06 ENCOUNTER — Telehealth (INDEPENDENT_AMBULATORY_CARE_PROVIDER_SITE_OTHER): Payer: Self-pay | Admitting: Hospitalist

## 2019-03-07 ENCOUNTER — Telehealth (INDEPENDENT_AMBULATORY_CARE_PROVIDER_SITE_OTHER): Payer: Self-pay | Admitting: Orthopaedic Surgery of the Spine

## 2019-03-07 ENCOUNTER — Telehealth (INDEPENDENT_AMBULATORY_CARE_PROVIDER_SITE_OTHER): Payer: Self-pay | Admitting: Hospitalist

## 2019-03-07 NOTE — Progress Notes (Signed)
Attending Note:    Subjective:  I reviewed the history.  Patient interviewed and examined.  History of present illness (HPI):  Surgical Follow-up (1st postop Lumbar fusion)     Review of Systems (ROS): As per the fellow's note.  Past Medical, Family, Social History:  As per the fellow's  note.    Objective:   I have examined the patient and I concur with the fellow's exam.  Assessment and plan reviewed with the fellow. I agree with the fellow's plan as documented.  See the fellow's note for further details.

## 2019-03-07 NOTE — Telephone Encounter (Signed)
Routed to referrals coordinator.

## 2019-03-07 NOTE — Telephone Encounter (Signed)
Pt is s/p lumbar fusion on 01/03/19. Pt states Dr. Earnest Conroy referred her to outpatient PT, however [facility] sent a letter approving [facility] with [facility]. Letter is dated 06/24.    Please call pt and advise. Referral shell does not have rendering facility listed. Pt states she requested [facility].

## 2019-03-07 NOTE — Telephone Encounter (Signed)
General Inquiry     Who is calling: Incoming call from patient    Reason for this call:     Patient is following up on [id number] referral. She is aware [place] will no longer be in contract.     Action required by office: Please contact caller   Duplicate encounter? No previous documentation found on this issue.  Best way to [phone]    Inquiry has been read verbatim to this caller. Verbalizes satisfaction and confirms the above is accurate: yes    Has been advised this message will be transmitted to office and can expect a response within the next 24-72 hours.    Encounter created by Care Assist MA.  If further action required please route encounter to appropriate in clinic MA/LVN/Resident Pool

## 2019-03-07 NOTE — Telephone Encounter (Signed)
Left msg for patient to call back regarding physical therapy.  [facility] does not take [employer] [facility].

## 2019-03-07 NOTE — Telephone Encounter (Signed)
Faxed physical therapy rx to [facility]: [phone]

## 2019-03-08 NOTE — Telephone Encounter (Signed)
PAR Submitted for Diclofenac on 03/08/19  via CMM Key # [id number])      ** Please allow 3-5 business days for determination from insurance**    [place] ID- [id number] F    Diagnosis- pain surgical site  ICD-10- G89.18  Medications  T&F-   Justification-       Will monitor status,     [facility]  Phone: [phone]  Ext: [phone]

## 2019-03-08 NOTE — Telephone Encounter (Signed)
Referral was done by Ortho. Please forward to right clinic.

## 2019-03-13 NOTE — Telephone Encounter (Signed)
PAR APPROVED for Diclofenac      #100gm/25 days x 12 mos    Valid until 03/08/20  QR#[phone]    Pharmacy has been notified and will contact patient when ready to be picked up.     No further action needed, closing encounter.     Thank You!    [facility] [place] [facility]    Phone: [phone]  Ext: [phone]

## 2019-03-21 NOTE — Telephone Encounter (Signed)
Noted Ortho already sent PT referral [facility]. Please see 03/07/19 encounter.    Closing encounter.

## 2019-03-22 ENCOUNTER — Encounter (INDEPENDENT_AMBULATORY_CARE_PROVIDER_SITE_OTHER): Payer: Self-pay | Admitting: Orthopaedic Surgery of the Spine

## 2019-03-22 DIAGNOSIS — Z981 Arthrodesis status: Secondary | ICD-10-CM

## 2019-03-29 ENCOUNTER — Ambulatory Visit (INDEPENDENT_AMBULATORY_CARE_PROVIDER_SITE_OTHER): Payer: Medicaid Other | Admitting: Orthopaedic Surgery of the Spine

## 2019-03-29 ENCOUNTER — Inpatient Hospital Stay (INDEPENDENT_AMBULATORY_CARE_PROVIDER_SITE_OTHER): Admit: 2019-03-29 | Discharge: 2019-03-29 | Disposition: A | Discharge status: Home Health | Payer: Medicaid Other

## 2019-03-29 VITALS — BP 103/77 | HR 71 | Temp 97.8°F | Ht 70.0 in | Wt 169.8 lb

## 2019-03-29 DIAGNOSIS — Z981 Arthrodesis status: Secondary | ICD-10-CM

## 2019-03-29 DIAGNOSIS — M48061 Spinal stenosis, lumbar region without neurogenic claudication: Secondary | ICD-10-CM

## 2019-03-29 DIAGNOSIS — Z09 Encounter for follow-up examination after completed treatment for conditions other than malignant neoplasm: Secondary | ICD-10-CM

## 2019-03-29 MED ORDER — ZOLPIDEM TARTRATE 5 MG OR TABS
5.0000 mg | ORAL_TABLET | Freq: Every evening | ORAL | 0 refills | Status: DC | PRN
Start: 2019-03-29 — End: 2019-05-17

## 2019-03-29 MED ORDER — CYCLOBENZAPRINE HCL 5 MG OR TABS
5.0000 mg | ORAL_TABLET | Freq: Three times a day (TID) | ORAL | 0 refills | Status: DC | PRN
Start: 2019-03-29 — End: 2019-11-04

## 2019-03-29 NOTE — Progress Notes (Signed)
Spine Surgery Post Operative Visit    DATE OF SERVICE:  01/03/2019    PROCEDURE PERFORMED:    1.  Far lateral interbody fusion L4-L5 with PEEK cage (NuVasive),  Formagraft, rhBMP-2.   2.  Placement of biomechanical vertebral structural device consisting  of polyetheretherketone cage.   3.  Interpretation of intraoperative fluoroscopy.  4.  Interpretation of intraoperative neuromonitoring (NuVasive and  evoked potentials).     PROCEDURE PERFORMED:    1.  Removal of retained instrumentation consisting of screws and rods  with hinge device, L4-L5.   2.  Revision laminectomies, partial L4 and L5, with bilateral  foraminotomies, L4-L5.   3.  Revision Smith-Petersen osteotomy, L4-L5.  4.  Decompression of cauda equina and exploration of nerve roots,  bilateral L5-L4, with resection of extensive epidural scar tissue.   5.  Posterior spinal instrumentation and fusion, L4-L5, with screws  and rods (DePuy Synthes), local bone autograft, DBM (Accell),  beta-tricalcium phosphate (MagnetOs).   6.  Interpretation of intraoperative fluoroscopy.  7.  Interpretation of intraoperative neuromonitoring.  22-modifier due to complex revision resulting in increased time and difficulty    Subjective: The patient returns today now approximately 3 months following the above procedures . The date of surgery was 01/03/19. The patient is doing well and has no complaints. The patient rates her pain at 5 today. The patient denies problems with the incision or any fevers, chills or night sweats. She has discontinued brace wear. Reports she still has some residual pain and weakness in the left lower extremity as well as some residual low back pain but otherwise denies any significant changes or acute concerns. Denies any bowel/bladder dysfunction/saddle anesthesia. She is no longer taking narcotic pain medication, only ambien to help with sleep at night and flexaril for occasional muscle spasms.    Objective: Examination of the incision reveals a  healing without evidence of erythema or infection. Sensation is intact to light touch. Motor strength is 5/5 in both upper and lower extremities. Reflexes are symmetric. No pathologic reflexes.     Imaging: Films ordered and viewed today show that the instrumentation is intact and the spine remains in good alignment.     Impression: Patient doing well status post the above procedures, 3 months out    Plan: We have again discussed post-operative instructions. Wound care instructions have again been provided. We provided her with another prescription for PT as well as ambien to help with sleep, 5 mg x 30 tablets with no refills. Also provided a refill of flexaril 5 mg x 30 tablets with no refills. All of the patient's questions were answered. We will plan on seeing the patient back in the office in 6 weeks. If any problems develop prior to that time the patient will let us know and we can see her right away.

## 2019-04-07 NOTE — Progress Notes (Signed)
Spine Surgery Post Operative Visit      Subjective: The patient returns today now approximately 8 weeks following revision lumbar fusion.  The patient is doing well and has no complaints. The patient rates her pain at 4 today. The patient denies problems with the incision or any fevers, chills or night sweats. She is wearing the brace and tolerating it well.    Objective: Examination of the incision reveals a healing without evidence of erythema or infection. Sensation is intact to light touch. Motor strength is 5/5 in both upper and lower extremities. Reflexes are symmetric. No pathologic reflexes.     Imaging: Films ordered and viewed today show that the instrumentation is intact and the spine remains in good alignment.     Impression: Patient doing well status post lumbar fusion    Plan: We have again discussed post-operative instructions. Wound care instructions have again been provided. We will transition the patient to a home exercise program. All of the patient's questions were answered. We will plan on seeing the patient back in the office in 6 weeks. If any problems develop prior to that time the patient will let us know and we can see her right away.

## 2019-04-17 ENCOUNTER — Encounter (INDEPENDENT_AMBULATORY_CARE_PROVIDER_SITE_OTHER): Payer: Self-pay | Admitting: Orthopaedic Surgery of the Spine

## 2019-04-17 DIAGNOSIS — Z981 Arthrodesis status: Secondary | ICD-10-CM

## 2019-04-19 ENCOUNTER — Inpatient Hospital Stay (INDEPENDENT_AMBULATORY_CARE_PROVIDER_SITE_OTHER): Admit: 2019-04-19 | Discharge: 2019-04-19 | Disposition: A | Discharge status: Home Health | Payer: Medicaid Other

## 2019-04-19 ENCOUNTER — Other Ambulatory Visit: Payer: Self-pay

## 2019-04-19 ENCOUNTER — Ambulatory Visit (INDEPENDENT_AMBULATORY_CARE_PROVIDER_SITE_OTHER): Payer: Medicaid Other | Admitting: Orthopaedic Surgery of the Spine

## 2019-04-19 VITALS — BP 87/61 | HR 101 | Temp 98.4°F | Ht 70.0 in

## 2019-04-19 DIAGNOSIS — M48061 Spinal stenosis, lumbar region without neurogenic claudication: Secondary | ICD-10-CM

## 2019-04-19 DIAGNOSIS — M533 Sacrococcygeal disorders, not elsewhere classified: Secondary | ICD-10-CM

## 2019-04-19 DIAGNOSIS — Z981 Arthrodesis status: Secondary | ICD-10-CM

## 2019-04-19 MED ORDER — HYDROCODONE-ACETAMINOPHEN 5-325 MG OR TABS
1.00 | ORAL_TABLET | Freq: Four times a day (QID) | ORAL | 0 refills | Status: DC | PRN
Start: 2019-04-19 — End: 2019-06-19

## 2019-04-19 MED ORDER — TIZANIDINE HCL 4 MG OR TABS
4.00 mg | ORAL_TABLET | Freq: Four times a day (QID) | ORAL | 0 refills | Status: DC | PRN
Start: 2019-04-19 — End: 2019-11-04

## 2019-04-19 MED ORDER — GABAPENTIN 300 MG OR CAPS
300.00 mg | ORAL_CAPSULE | Freq: Three times a day (TID) | ORAL | 0 refills | Status: DC
Start: 2019-04-19 — End: 2019-06-14

## 2019-04-24 ENCOUNTER — Encounter (INDEPENDENT_AMBULATORY_CARE_PROVIDER_SITE_OTHER): Payer: Self-pay

## 2019-04-24 ENCOUNTER — Encounter (INDEPENDENT_AMBULATORY_CARE_PROVIDER_SITE_OTHER): Payer: Self-pay | Admitting: Orthopaedic Surgery of the Spine

## 2019-04-24 NOTE — Telephone Encounter (Signed)
From: [name]  To: [name], MD  Sent: 7/74/1423 8:52 AM PDT  Subject: 2-Procedural Question    Hi Doctor Z and team,    I was wondering if Doc Z would be opposed to me trying PRP injections before the steroid injections for my SI joints? I think I'll likely be able to access those sooner as well.    Thanks,  [name]

## 2019-05-08 NOTE — Progress Notes (Signed)
Attending Note:    Subjective:  I reviewed the history.  Patient interviewed and examined.  History of present illness (HPI):  Back Pain (Patient here today for follow up s/p fusion)     Review of Systems (ROS): As per the fellow's note.  Past Medical, Family, Social History:  As per the fellow's  note.    Objective:   I have examined the patient and I concur with the fellow's exam.  Assessment and plan reviewed with the fellow. I agree with the fellow's plan as documented.  See the fellow's note for further details.

## 2019-05-09 ENCOUNTER — Encounter (HOSPITAL_BASED_OUTPATIENT_CLINIC_OR_DEPARTMENT_OTHER): Payer: Self-pay | Admitting: Orthopaedic Surgery

## 2019-05-09 ENCOUNTER — Ambulatory Visit: Payer: Medicaid Other | Attending: Orthopaedic Surgery | Admitting: Orthopaedic Surgery

## 2019-05-09 VITALS — BP 107/61 | HR 70 | Temp 97.8°F | Ht 70.0 in | Wt 170.0 lb

## 2019-05-09 DIAGNOSIS — M533 Sacrococcygeal disorders, not elsewhere classified: Secondary | ICD-10-CM | POA: Insufficient documentation

## 2019-05-09 NOTE — Progress Notes (Signed)
ORTHOPAEDIC PHYSICAL MEDICINE ESTABLISHED PATIENT VISIT   PCP: Karma Lew.  Reason For Visit: had concerns including Recheck (low back pain).    History Of Present Illness: 38 year old female here to follow-up on left back pain.    Last Clinic Visit 05/2018 for left leg pain, nonspecific back and occasional right abdomen/pelvis.    "1. Updated MRI lumbar spine, MRI pelvis bone  - obtain information to schedule at either [facility] or [facility]    2. Follow up with Ortho Spine surgery    3. Follow up with your PCP for:    Pain medication management  Consideration for a consultation to [facility] for emotional intervention and support  Consider for a consultation to [facility]  PRN f/u"    Went to [place] for an experimental device 07-2018    Far better now after fusion than she was, fusion 12-2018  Dr Earnest Conroy clinic 03-2019 "We provided her with another prescription for PT as well as ambien to help with sleep, 5 mg x 30 tablets with no refills. Also provided a refill of flexaril 5 mg x 30 tablets with no refills" and follow up in 6 weeks.    She saw Dr Earnest Conroy more recently, no notes in Epic. 04-19-2019 talked about injection with me, ie SI joints and no more PT. She feels better with no PT. She has a new MRI 04-2019 LS SPINE. Pain left SI area, (right not so much an issue) pain 3-7/10 this past week. No f/c, her weight is stable, voiding normally.    Current Outpatient Medications on File Prior to Visit   Medication Sig   . acetaminophen (TYLENOL) 325 MG tablet Take 3 tablets (975 mg) by mouth every 8 hours.   . ALPRAZolam (XANAX) 0.25 MG tablet take 1 tablet by mouth once daily if needed   . Amphetamine-Dextroamphetamine (ADDERALL PO) as needed.   . clindamycin (CLEOCIN T) 1 % solution Apply 1 Application topically 2 times daily. Use a small amount as directed   . clindamycin (CLEOCIN) 300 MG capsule Take 1 capsule (300 mg) by mouth 3 times daily.   . cyclobenzaprine (FLEXERIL) 5 MG tablet Take  1 tablet (5 mg) by mouth 3 times daily as needed for Muscle Spasms.   . diazepam (VALIUM) 5 MG tablet Take 1 tablet (5 mg) by mouth every 6 hours as needed for Insomnia or Muscle Spasms.   . diclofenac (VOLTAREN) 1 % gel Apply 1 g topically 4 times daily.   . drospirenone-ethinyl estradiol (YAZ) 3-0.02 MG tablet Take 1 tablet by mouth daily.   Marland Kitchen gabapentin (NEURONTIN) 300 MG capsule Take 1 capsule (300 mg) by mouth 3 times daily.   Marland Kitchen HYDROcodone-acetaminophen (NORCO) 5-325 MG tablet Take 1 tablet by mouth every 6 hours as needed for Moderate Pain (Pain Score 4-6).   Marland Kitchen tiZANidine (ZANAFLEX) 4 MG tablet Take 1 tablet (4 mg) by mouth every 6 hours as needed (spasm).   Marland Kitchen zolpidem (AMBIEN) 5 MG tablet Take 1 tablet (5 mg) by mouth nightly as needed for Insomnia.     No current facility-administered medications on file prior to visit.        Vitals: There were no vitals taken for this visit.   BMI: There is no height or weight on file to calculate BMI.   Physical Examination:  General: Well appearing and well groomed.   Mental Status: Pleasant and cooperative.   Musculoskeletal: Normal babinski, symmetric DTRs at the  knees/ankles, no clonus on ankle jerk, full strength HF/KE/DF/EHL  Right hip abd strength 5/5      IMAGING STUDIES:                     Reviewed pertinent information provided by patient/provider.    Impression: left SI joint pain overall better but still symptomatic s/p lumbar spine fusion surgery    Plan: The recommended plan is for     SI J injection - when I went to write orders, it turns out Dr Zlomislic already wrote for the procedure.  We spoke about risks of nerve injury/infection/bleeding.    Educational diagram and thoughts/opinions shared. We spent 30 minutes with this patient. More than 50 percent of this time was spent discussing the diagnosis and the treatment plan. All questions were answered and Jackelyn HoehnMegan Clyne understood and was satisfied with this plan.    Followup: Return to clinic PRN  weeks.  The patient is encouraged to call us with any questions or problems in the interim. Our contact numbers were given to the patient.      Sherrie Georgeouglas Meekah Math, M.D., Ph.D.  Clinical Professor, [facility] Department of Orthopaedic Surgery  Chief, Physical Medicine and Rehabilitation   MLB [facility] medical consultant    [email or web address]

## 2019-05-09 NOTE — Patient Instructions (Signed)
It was a pleasure seeing you today. Today, we discussed:     Left sacroiliac joint pain    1. Left sacroiliac joint injection with steroid. (temporary facial flushing can be a side effect).    Please call the number listed below to schedule your injection procedure. Appointments are scheduled upon insurance approval of the procedure, if required. Please note that some insurances take up to 2 weeks for approval.    [facility]   (Dr. Radene Knee: Monday PMs, Wednesday AMs)  [facility], lower level  [address]   [place], [place] [address]  [email or web address]  Scheduling Line: [phone], option 0 (Beata Othelia Pulling)      To promote a safe environment for our patients, [facility] asks for COVID testing for all patients scheduled for a clinic appointments. Due to your up-coming procedure, your provider has placed an order for you to receive COVID testing.     Please follow the below instructions to schedule your testing appointment:     Please call [phone] to schedule an appointment at one of our drive-up testing sites. An appointment is REQUIRED in order to get the test performed.      We currently have 5 locations you can choose from:     . [facility] - [place], [place], [place] [address]   . [place], [place], [place] [address]   . [place], [place], parking lot [phone].  . [place], [place], [place] [address]   . [place], [address], [place], [place] [address]     Testing is available 7 days/ week 8a-4:30p, except for 12p-1p for lunch     You testing appointment needs to be within 72 hrs. (3 days) or less BEFORE your procedure appointment. Current result turnaround time is about 24 hrs, but results can take up to 72 hrs to process.     If you have any questions regarding testing for your pain procedure, please call the Pain Management  call center at [phone].    Please also remember there is a risk of coming to the hospital during COVID-19 pandemic.  The procedure will not be done if there are symptoms present suggesting this illness.  There will be COVID-19 screening before entering the medical center.  This may prevented you from entering the building and require rescheduling.      If your COVID test is positive we will contact you and recommend further care.   Your procedure will then be rescheduled.    When you come in for your procedure you will be given a mask and will be asked to wear it at all times while in the hospital.   Family/significant others will not be allowed to come in with you for the procedure.      Last, these recommendations are based on current beliefs and guidelines.  If conditions change, your procedure could be canceled/rescheduled with little to no notice.        2. Follow up with Dr Zlomislic        Take care!  Macon Large, MD PhD

## 2019-05-17 ENCOUNTER — Ambulatory Visit (INDEPENDENT_AMBULATORY_CARE_PROVIDER_SITE_OTHER): Payer: Medicaid Other | Admitting: Orthopaedic Surgery of the Spine

## 2019-05-17 ENCOUNTER — Encounter (INDEPENDENT_AMBULATORY_CARE_PROVIDER_SITE_OTHER): Payer: Self-pay | Admitting: Orthopaedic Surgery of the Spine

## 2019-05-17 DIAGNOSIS — M533 Sacrococcygeal disorders, not elsewhere classified: Secondary | ICD-10-CM

## 2019-05-17 DIAGNOSIS — M48061 Spinal stenosis, lumbar region without neurogenic claudication: Secondary | ICD-10-CM

## 2019-05-17 DIAGNOSIS — Z981 Arthrodesis status: Secondary | ICD-10-CM

## 2019-05-17 MED ORDER — ZOLPIDEM TARTRATE 5 MG OR TABS
5.0000 mg | ORAL_TABLET | Freq: Every evening | ORAL | 0 refills | Status: DC | PRN
Start: 2019-05-17 — End: 2019-06-14

## 2019-05-17 NOTE — Progress Notes (Signed)
ORTHOPAEDIC SPINE SURGERY     Visit Type: Office Visit    Reason For Visit: Follow Up (Followup lumbar pain)    Surgery Date: 01/03/2019    Procedure: ROI, revision XLIF L4-5 and PSIF L4-5     History Of Present Illness: 38 year old female returns to spine clinic now approximately 16 weeks s/p revision lumbar fusion L4-5 as noted above. She is much improved compared to her prior visit and states that she is much better. She continues to hold off any PT. She states that she has been able to do certain things which she had not been able to since last February, including going out to dinner ane walking about 2 miles. She does complain of some muscle pain and soreness the day after she does too much, and she does try to moderate her activities accordingly.  She localizes pain to the the left greater than right lumbosacral and SI joint region. Denies any radicular pain or weakness or numbness. No issues with incisions. Patient rates her pain as 4  . Patient reports no changes in bowel and bladder function. Patient returns for routine follow up.    Allergies:   Allergies   Allergen Reactions   . Sulfa Drugs Nausea Only and Anxiety   . Tramadol Nausea and Vomiting       Medications:   Current Outpatient Medications   Medication Sig   . acetaminophen (TYLENOL) 325 MG tablet Take 3 tablets (975 mg) by mouth every 8 hours.   . ALPRAZolam (XANAX) 0.25 MG tablet take 1 tablet by mouth once daily if needed   . Amphetamine-Dextroamphetamine (ADDERALL PO) as needed.   . clindamycin (CLEOCIN T) 1 % solution Apply 1 Application topically 2 times daily. Use a small amount as directed   . clindamycin (CLEOCIN) 300 MG capsule Take 1 capsule (300 mg) by mouth 3 times daily.   . cyclobenzaprine (FLEXERIL) 5 MG tablet Take 1 tablet (5 mg) by mouth 3 times daily as needed for Muscle Spasms.   . diazepam (VALIUM) 5 MG tablet Take 1 tablet (5 mg) by mouth every 6 hours as needed for Insomnia or Muscle Spasms.   . diclofenac (VOLTAREN) 1 % gel  Apply 1 g topically 4 times daily.   . drospirenone-ethinyl estradiol (YAZ) 3-0.02 MG tablet Take 1 tablet by mouth daily.   Marland Kitchen gabapentin (NEURONTIN) 300 MG capsule Take 1 capsule (300 mg) by mouth 3 times daily.   Marland Kitchen HYDROcodone-acetaminophen (NORCO) 5-325 MG tablet Take 1 tablet by mouth every 6 hours as needed for Moderate Pain (Pain Score 4-6).   Marland Kitchen tiZANidine (ZANAFLEX) 4 MG tablet Take 1 tablet (4 mg) by mouth every 6 hours as needed (spasm).   Marland Kitchen zolpidem (AMBIEN) 5 MG tablet Take 1 tablet (5 mg) by mouth nightly as needed for Insomnia.     No current facility-administered medications for this visit.          Review of Systems:  CONSTITUTIONAL: No unexpected weight loss, fevers, chills, or anorexia. SKIN: Noncontributory. GENITOURINARY: Symptoms as noted above in HPI. NEUROLOGIC: Symptoms as noted above in HPI. MUSCULOSKELETAL: Symptoms as noted above in HPI.    Physical Examination:  There were no vitals taken for this visit.  CONSTITUTIONAL: Well appearing and well groomed. PSYCHOLOGICAL: Alert and oriented and appropriate to situation. INTEGUMENT: Intact. VASCULAR: Radial and pedal pulses are intact and symmetric. EXTREMITIES: Bilateral upper and lower extremities have good range of motion and no significant deformities. GAIT: Brisk with good  coordination.     CERVICAL SPINE: Nontender to palpation. Spurling's test negative. Sensation intact of the upper extremities.    RANGE OF MOTION (in degrees)       RT LT  Lateral motion   80  80  Lateral bend   45  45  Flexion 40 degrees. Extension 35 degrees. Motion in all planes is nonpainful.    MOTOR    RT LT  Trapezius   5/5  5/5  Deltoid    5/5  5/5  Bicep    5/5  5/5  Tricep    5/5  5/5  Wrist flexor   5/5  5/5  Wrist extensor  5/5  5/5  Intrinsics   5/5  5/5  Grip    5/5  5/5    DEEP TENDON REFLEXES       RT  LT  Bicep    2+  2+  Brachioradialis  2+  2+  Tricep    2+  2+    THORACIC SPINE  Nontender to percussion. Alignment - normal, no paravertebral muscle  fullness. Sensation intact. Beevor's negative. Abdominal reflexes are absent and symmetric.     LUMBAR SPINE  Left lumbosacral and SI joint tenderness to palpation. +fortins left. +thigh thrust and Patricks on left. Heel to toe walk with difficulty due to pain. Sensation intact of the lower extremities. Trendelenburg sign negative. Skin intact.    RANGE OF MOTION        RT  LT  lateral bend, normal at 25 degrees 25  25  rotation, normal at 30 degrees 30  30  Flexion - normal at 40 degrees. Extension - normal at 10 degrees.    MOTOR STRENGTH       RT  LT  Iliopsoas   5/5  5/5  Quadricep   5/5  5/5  Anterior tibialis   5/5  5/5  Extensor hallucis longus 5/5  5/5  Gastrocsoleus  5/5  5/5    DEEP TENDON REFLEXES       RT  LT  Patellar   2+  2+  Achilles   2+  2+    PATHOLOGIC REFLEXES      RT LT  Clonus    Neg Neg  Babinski   Neg Neg  Hoffmans   Neg Neg    STRAIGHT LEG RAISING       RT  LT  Sitting @ 90 degrees   Neg Neg    IMAGING STUDIES:   Plain radiographs lumbar spien shows:  IMPRESSION:  No interval change. Status post L4/L5 pedicle screw instrumentation, anterior interbody grafting and posterior bone grafting after decompression without change in alignment or complications.    MRI lumbar spine shows intact instrumentation, interval reduction of spondylolisthesis with wide laminectomy, small seroma present in laminectomy bed, no stenosis.      OTHER MEDICAL RECORDS/IMAGING STUDIES REVIEWED: None    IMPRESSION:   No diagnosis found.     PLAN/DISCUSSION: Reviewed clinical history as well as exam results and imaging findings with patient in detail. Imaging shows intact instrumentation with excellent anatomic alignment and no evidence of stenosis. She continues to have ongoing left greater than right lumbosacral and SI joint pain. She was recently evaluated by Dr Radene Knee and is in the process of scheduling an injection. Will continue observation and provide her reassurance. Continue to hold off any active PT for now,  and she may continue to moderate her activities based on her symptoms.  All questions  were answered and Haley Barrett understood and was satisfied with this plan.     FOLLOWUP: Return to clinic: six weeks The patient is encouraged to call us with any questions or problems in the interim. Our contact numbers were given to the patient.

## 2019-05-17 NOTE — Progress Notes (Signed)
ORTHOPAEDIC SPINE SURGERY     Visit Type: Office Visit    Reason For Visit: Low Back Pain (Patient here today for acute LBP with radiating pain down both legs as of Sunday.  )    Surgery Date: 01/03/2019    Procedure: ROI, revision XLIF L4-5 and PSIF L4-5     History Of Present Illness: 38 year old female returns to spine clinic now approximately 14 weeks s/p revision lumbar fusion L4-5 as noted above. She states that she had been doing well, but has had aggravation of low back pain especially over the past few months. She is tearful stating that her pain is so severe that she is at times unable to engage in ADLs. She is frustrated by her ongoing pain, and states that at times PT has aggravated her symptoms. She has pain with all activities, and does have relief with rest and laying flat. She localizes pain to the the left greater than right lumbosacral and SI joint region. Denies any radicular pain or weakness or numbness. No issues with incisions. Patient rates her pain as 10. Patient reports no changes in bowel and bladder function. Patient returns for routine follow up.    Allergies:   Allergies   Allergen Reactions   . Sulfa Drugs Nausea Only and Anxiety   . Tramadol Nausea and Vomiting       Medications:   Current Outpatient Medications   Medication Sig   . acetaminophen (TYLENOL) 325 MG tablet Take 3 tablets (975 mg) by mouth every 8 hours.   . ALPRAZolam (XANAX) 0.25 MG tablet take 1 tablet by mouth once daily if needed   . Amphetamine-Dextroamphetamine (ADDERALL PO) as needed.   . clindamycin (CLEOCIN T) 1 % solution Apply 1 Application topically 2 times daily. Use a small amount as directed   . clindamycin (CLEOCIN) 300 MG capsule Take 1 capsule (300 mg) by mouth 3 times daily.   . cyclobenzaprine (FLEXERIL) 5 MG tablet Take 1 tablet (5 mg) by mouth 3 times daily as needed for Muscle Spasms.   . diazepam (VALIUM) 5 MG tablet Take 1 tablet (5 mg) by mouth every 6 hours as needed for Insomnia or Muscle  Spasms.   . diclofenac (VOLTAREN) 1 % gel Apply 1 g topically 4 times daily.   . drospirenone-ethinyl estradiol (YAZ) 3-0.02 MG tablet Take 1 tablet by mouth daily.   Marland Kitchen. gabapentin (NEURONTIN) 300 MG capsule Take 1 capsule (300 mg) by mouth 3 times daily.   Marland Kitchen. HYDROcodone-acetaminophen (NORCO) 5-325 MG tablet Take 1 tablet by mouth every 6 hours as needed for Moderate Pain (Pain Score 4-6).   Marland Kitchen. tiZANidine (ZANAFLEX) 4 MG tablet Take 1 tablet (4 mg) by mouth every 6 hours as needed (spasm).   Marland Kitchen. zolpidem (AMBIEN) 5 MG tablet Take 1 tablet (5 mg) by mouth nightly as needed for Insomnia.     No current facility-administered medications for this visit.          Review of Systems:  CONSTITUTIONAL: No unexpected weight loss, fevers, chills, or anorexia. SKIN: Noncontributory. GENITOURINARY: Symptoms as noted above in HPI. NEUROLOGIC: Symptoms as noted above in HPI. MUSCULOSKELETAL: Symptoms as noted above in HPI.    Physical Examination:  BP (!) 87/61 (BP Location: Left arm, BP Patient Position: Sitting, BP cuff size: Regular)   Pulse 101   Temp 98.4 F (36.9 C) (Temporal)   Ht 5\' 10"  (1.778 m)   BMI 24.36 kg/m   CONSTITUTIONAL: Well appearing and well  groomed. PSYCHOLOGICAL: Alert and oriented and appropriate to situation. INTEGUMENT: Intact. VASCULAR: Radial and pedal pulses are intact and symmetric. EXTREMITIES: Bilateral upper and lower extremities have good range of motion and no significant deformities. GAIT: Brisk with good coordination.     CERVICAL SPINE: Nontender to palpation. Spurling's test negative. Sensation intact of the upper extremities.    RANGE OF MOTION (in degrees)       RT LT  Lateral motion   80  80  Lateral bend   45  45  Flexion 40 degrees. Extension 35 degrees. Motion in all planes is nonpainful.    MOTOR    RT LT  Trapezius   5/5  5/5  Deltoid    5/5  5/5  Bicep    5/5  5/5  Tricep    5/5  5/5  Wrist flexor   5/5  5/5  Wrist extensor  5/5  5/5  Intrinsics   5/5  5/5  Grip    5/5  5/5    DEEP  TENDON REFLEXES       RT  LT  Bicep    2+  2+  Brachioradialis  2+  2+  Tricep    2+  2+    THORACIC SPINE  Nontender to percussion. Alignment - normal, no paravertebral muscle fullness. Sensation intact. Beevor's negative. Abdominal reflexes are absent and symmetric.     LUMBAR SPINE  Left lumbosacral and SI joint tenderness to palpation. +fortins left. +thigh thrust and Patricks on left. Heel to toe walk with difficulty due to pain. Sensation intact of the lower extremities. Trendelenburg sign negative. Skin intact.    RANGE OF MOTION        RT  LT  lateral bend, normal at 25 degrees 25  25  rotation, normal at 30 degrees 30  30  Flexion - normal at 40 degrees. Extension - normal at 10 degrees.    MOTOR STRENGTH       RT  LT  Iliopsoas   5/5  5/5  Quadricep   5/5  5/5  Anterior tibialis   5/5  5/5  Extensor hallucis longus 5/5  5/5  Gastrocsoleus  5/5  5/5    DEEP TENDON REFLEXES       RT  LT  Patellar   2+  2+  Achilles   2+  2+    PATHOLOGIC REFLEXES      RT LT  Clonus    Neg Neg  Babinski   Neg Neg  Hoffmans   Neg Neg    STRAIGHT LEG RAISING       RT  LT  Sitting @ 90 degrees   Neg Neg    IMAGING STUDIES:   Plain radiographs lumbar spien shows:  IMPRESSION:  No interval change. Status post L4/L5 pedicle screw instrumentation, anterior interbody grafting and posterior bone grafting after decompression without change in alignment or complications.    MRI lumbar spine shows intact instrumentation, interval reduction of spondylolisthesis with wide laminectomy, small seroma present in laminectomy bed, no stenosis.      OTHER MEDICAL RECORDS/IMAGING STUDIES REVIEWED: None    IMPRESSION:   Encounter Diagnoses   Name Primary?   . Sacroiliac joint dysfunction of both sides Yes   . Spinal stenosis of lumbar region, unspecified whether neurogenic claudication present     Comment: Added automatically from request for surgery [phone]   . S/P lumbar fusion     Comment: Added automatically from request for surgery [phone]  PLAN/DISCUSSION: Reviewed clinical history as well as exam results and imaging findings with patient in detail. Imaging shows intact instrumentation with excellent anatomic alignment and no evidence of stenosis. On exam she does have focal pain that localizes to the left SI joint region and we discussed options for this. She has had extensive course of conservative management with time, activity modification, and PT which have not been successful, and she actually feels that PT actually exacerbates her symptoms. We discussed referral to pain management for left SI joint injeciton which may prove further diagnostic as well as possibly therapeutic and referral placed. She also inquired about refill for pain medications as well as muscle relaxants, order placed.  All questions were answered and Haley Barrett understood and was satisfied with this plan.     FOLLOWUP: Return to clinic: four weeks. The patient is encouraged to call us with any questions or problems in the interim. Our contact numbers were given to the patient.

## 2019-05-17 NOTE — Addendum Note (Signed)
Addended byJennye Moccasin on: 03/10/1637 02:07 PM     Modules accepted: Orders

## 2019-05-18 ENCOUNTER — Encounter (INDEPENDENT_AMBULATORY_CARE_PROVIDER_SITE_OTHER): Payer: Self-pay | Admitting: Orthopaedic Surgery of the Spine

## 2019-05-18 ENCOUNTER — Encounter (HOSPITAL_BASED_OUTPATIENT_CLINIC_OR_DEPARTMENT_OTHER): Payer: Self-pay | Admitting: Orthopaedic Surgery

## 2019-05-18 ENCOUNTER — Telehealth (HOSPITAL_BASED_OUTPATIENT_CLINIC_OR_DEPARTMENT_OTHER): Payer: Self-pay | Admitting: Orthopaedic Surgery

## 2019-05-18 NOTE — Telephone Encounter (Signed)
Patient called asking to schedule SI joint injection with Dr. Radene Knee. I informed patient that we do not have any orders for injections with Dr, Radene Knee. We do have an order from 08/12 for SI injection written by Dr. Lelon Huh but it is for Dr. Herbie Drape to do injection and because contract has ended between [facility] and [employer] need to be very specific in terms of providers ( if Josem Kaufmann is for Dr. Herbie Drape we cannot schedule with Dr. Radene Knee). Patient does not want to see Dr. Herbie Drape for the injection so she will call Dr. Radene Knee for new order to be placed. I informed patient that [place] uploaded to out system covers clinic visits with Dr. Radene Knee,  so separate [place] for injection will need to be obtained before procedures can be scheduled.

## 2019-05-18 NOTE — Telephone Encounter (Signed)
Noted. Case cancelled.

## 2019-05-19 ENCOUNTER — Encounter (HOSPITAL_BASED_OUTPATIENT_CLINIC_OR_DEPARTMENT_OTHER): Payer: Self-pay | Admitting: Physician Assistant

## 2019-05-19 DIAGNOSIS — M533 Sacrococcygeal disorders, not elsewhere classified: Secondary | ICD-10-CM

## 2019-05-19 NOTE — Telephone Encounter (Signed)
From: [name]  To: Denzil Hughes, MD  Sent: 05/18/2019 10:34 AM PDT  Subject: 2-Procedural Question    Hi Doctor Radene Knee and team,    I contacted the scheduling department and they said somehow the orders for my steroid injections actually went to another doctor instead of Doctor Radene Knee. I know Doctor Zlomislic had intended for me to see Doctor Radene Knee. I have already seen him previously as well as gotten continuity of care approved for Doctor Radene Knee through [facility]. I called and left voicemails with both the orthopedics department and Doc Z asking him to change the order, but if it is quicker for Doctor Radene Knee to change the order himself was hoping I could do that in whichever way would be most expedient as I've already been waiting a month to get in for the injections.    Thank you,  Haley Barrett    [phone]

## 2019-05-22 ENCOUNTER — Telehealth (HOSPITAL_BASED_OUTPATIENT_CLINIC_OR_DEPARTMENT_OTHER): Payer: Self-pay | Admitting: Orthopaedic Surgery

## 2019-05-22 NOTE — Telephone Encounter (Signed)
Patient called and LM wanting to schedule pain procedure with Dr. Radene Knee. Returned call to pt and informed pt on the voice message that we do not have an authorization on file yet and because Pt has Estevan Ryder we will need to make sure COC for injection with Dr. Radene Knee is also on file.

## 2019-05-23 NOTE — Telephone Encounter (Signed)
Submitted urgent request to [facility] - pending ref# [id number]

## 2019-05-24 ENCOUNTER — Encounter (INDEPENDENT_AMBULATORY_CARE_PROVIDER_SITE_OTHER): Payer: Self-pay | Admitting: Internal Medicine

## 2019-05-24 ENCOUNTER — Encounter (INDEPENDENT_AMBULATORY_CARE_PROVIDER_SITE_OTHER): Payer: Self-pay | Admitting: Hospital

## 2019-05-24 DIAGNOSIS — Z0000 Encounter for general adult medical examination without abnormal findings: Secondary | ICD-10-CM

## 2019-05-26 ENCOUNTER — Encounter (HOSPITAL_BASED_OUTPATIENT_CLINIC_OR_DEPARTMENT_OTHER): Payer: Self-pay | Admitting: Orthopaedic Surgery

## 2019-05-26 ENCOUNTER — Encounter (HOSPITAL_BASED_OUTPATIENT_CLINIC_OR_DEPARTMENT_OTHER): Payer: Self-pay | Admitting: Hospital

## 2019-05-31 ENCOUNTER — Encounter (INDEPENDENT_AMBULATORY_CARE_PROVIDER_SITE_OTHER): Payer: Medicaid Other | Admitting: Orthopaedic Surgery of the Spine

## 2019-05-31 NOTE — Telephone Encounter (Signed)
Approved & scheduled. Matter resolved.

## 2019-06-02 ENCOUNTER — Other Ambulatory Visit (INDEPENDENT_AMBULATORY_CARE_PROVIDER_SITE_OTHER): Payer: Medicaid Other | Attending: Physician Assistant

## 2019-06-02 ENCOUNTER — Other Ambulatory Visit (HOSPITAL_BASED_OUTPATIENT_CLINIC_OR_DEPARTMENT_OTHER): Payer: Self-pay | Admitting: Orthopaedic Surgery

## 2019-06-02 DIAGNOSIS — M533 Sacrococcygeal disorders, not elsewhere classified: Secondary | ICD-10-CM

## 2019-06-02 DIAGNOSIS — Z1159 Encounter for screening for other viral diseases: Secondary | ICD-10-CM | POA: Insufficient documentation

## 2019-06-02 NOTE — Interdisciplinary (Signed)
COVID-19 DRIVE-UP TESTING    Patient confirmed their name and date of birth. Requisition order and specimen labels were verified.   Obtained nasal specimen from patient per order and submitted to lab. Patient notified results will be given within 24-72 hrs via MyChart. After care instructions were provided and patient was advised to isolate until results are available. Patient tolerated procedure well.    Documented on behalf of MA:Ruth Avila

## 2019-06-02 NOTE — Patient Instructions (Signed)
Isolation and Result Information for [facility]  (updated 04/16/2019)    PLEASE KEEP THIS DOCUMENT UNTIL YOU RECEIVE YOUR RESULTS    RESULT INFORMATION:  If your COVID-19 Coronavirus Assay swab is resulted as Detected (Positive) you will receive a call from a Provider or Team that ordered your test, you will receive follow up monitoring for 10 days.     If your COVID-19 Coronavirus Assay swab is resulted as Not Detected (Negative) this suggests that the collected specimen from your nose did not have genetic material consistent with COVID-19. There is a small chance that the test could be falsely negative. Your negative results will be released only to MyChart, if you do not have MyChart we will communicate the results to you by telephone.     More information:  Your health care provider will evaluate whether you can be cared for at home. If it is determined that you do not need hospitalization and can be isolated at home, you will be monitored by your health care provider.      You should follow the prevention steps below and even if your test is negative follow these guidelines including waiting to leave home until you are no longer contagious as per instructions at end of this document.     Contact our dedicated nurse line [phone] if you are noting worsening respiratory symptoms and need advice. This line is open 8 am -5 pm 7 days a week.  If sudden and severe worsening in symptoms please call 911 and let them know you are being tested or are COVID-19 positive.         Stay home except to get medical care  [place] has a Health order for quarantine and it is a misdemeanor if you are not following:  https://www.[place].gov/content/dam/sdc/hhsa/programs/phs/Epidemiology/covid19/HealthOfficerOrder-Isolation.pdf  · You should do no activities outside your home, except for getting medical care. Do not go to work, school, or public areas. Do not use public transportation, ride-sharing, or  taxis.  · If you have a medical appointment, call the healthcare provider and tell them that you have or may have COVID-19. This will help the healthcare provider's office take steps to keep other people from getting infected or exposed.  · Have all essential items (e.g. groceries) delivered to your home and left at your doorstep. If you need assistance with this, call 211 to learn about available services.     Separate yourself from other people and animals in your home  · People: As much as possible, stay in a specific room and away from other people in your home. If available, you should use a separate bathroom.  · Animals: Restrict contact with pets and other animals while you are sick with COVID-19, just like you would around other people. Avoid petting, snuggling, being kissed or licked, and sharing food with pets. When possible, have another member of your household care for your animals while you are sick. If you must care for your pet or be around animals while you are sick, wash your hands before and after you interact with them and wear a facemask.      Notify contacts of your illness if you test positive:  · Because people infected with COVID can spread the illness before they develop symptoms, you need to make a list of all people you've been in contact with from 48 hours prior to your first symptom through until you started home isolation.  · Contact the people on   this list and inform them of your COVID19 diagnosis or potential diagnosis if you have symptoms but are not tested.   · Inform all of your contacts that they need to quarantine themselves in their homes for 14 days from the last point of contact.  They may leave their homes only to get medical care.    · If any of your contacts are an essential worker, they should contact their employer about their return to work policy.  If their employer does not have a policy, they may follow the CDC guidelines for exposed essential workers:   https://www.cdc.gov/coronavirus/2019-ncov/community/critical-workers/implementing-safety-practices.html      Wear a facemask   · You should wear a facemask when you are around other people (e.g., sharing a room or vehicle) or pets and before you enter a health care provider's office. If you are unable to wear a facemask (for example, because it causes trouble breathing), then people who live with you should not stay in the same room with you, or they should wear a facemask if they enter your room.     Cover your coughs and sneezes  · Cover your mouth and nose with a tissue when you cough or sneeze. Throw used tissues in a lined trash can; immediately wash your hands with soap and water for at least 20 seconds or clean your hands with an alcohol-based hand sanitizer that contains 60 to 95% alcohol, covering all surfaces of your hands and rubbing them together until they feel dry. Soap and water should be used preferentially if hands are visibly dirty.     Clean your hands often  · Wash your hands often with soap and water for at least 20 seconds or clean your hands with an alcohol-based hand sanitizer that contains 60 to 95% alcohol, covering all surfaces of your hands and rubbing them together until they feel dry. Soap and water should be used preferentially if hands are visibly dirty. Avoid touching your eyes, nose, and mouth with unwashed hands.     Avoid sharing personal household items  · You should not share dishes, drinking glasses, cups, eating utensils, towels, or bedding with other people or pets in your home. After using these items, they should be washed thoroughly with soap and water.     Clean all “high-touch” surfaces everyday  · High touch surfaces include counters, tabletops, doorknobs, bathroom fixtures, toilets, phones, keyboards, tablets, and bedside tables. Also, clean any surfaces that may have blood, stool, or bodily fluids on them. Use a household cleaning spray or wipe, according to the label  instructions. Labels contain instructions for safe and effective use of the cleaning product including precautions you should take when applying the product, such as wearing gloves and making sure you have good ventilation during use of the product.     Monitor your symptoms  · Seek prompt medical attention if your illness is worsening (e.g., difficulty breathing). Before seeking care, call your health care provider and tell them that you have, or are being evaluated for, COVID-19. Put on a facemask if you have one before you enter the facility. These steps will help the health care provider's office to keep other people in the office or waiting room from being infected or exposed.   Persons who are placed under active monitoring or facilitated self-monitoring should follow instructions provided by their local health department or occupational health professionals, as appropriate.  · If you have a medical emergency and need to call 911, notify the dispatch   personnel that you have, or are being evaluated for COVID-19. If possible, put on a facemask before emergency medical services arrive.     Discontinuing home isolation**  Patients with confirmed COVID-19 or with respiratory symptoms and a negative COVID-19 test should remain under home isolation precautions until the following three things have happened:     • You have had no fever for at least 72 hours (that is three full days of no fever without the use medicine that reduces fevers)  AND  • Other symptoms have improved (for example, when your cough or shortness of breath have improved)  AND  • At least 10 days have passed since your symptoms first appeared     • You should continue to use personal protective equipment.   • If you have any symptoms or questions please contact your Health Care Provider or Primary Care Physician for further guidance.  • Patients established with [facility]CP please contact your PCP   • For those that do not have a [facility] PCP but would  like additional guidance from [facility] please schedule an express care video visit by contacting [phone] [email or web address]    **For symptomatic [employer] employee please follow up with supervisor for return to work information. Please go to [email or web address] for additional information.     Please go to https://www.cdc.gov/coronavirus/2019-ncov/if-you-are-sick/index.html for additional information.    If you are an essential worker, please contact your employer after you meet the above criteria to discuss their specific return to work policy. Please abide by their policies.     Information for [employer] Students:  • If your results are negative, they will automatically be released to MyStudentChart.  • If your result is positive you will be called by a Student Health Provider and they will provide clinical guidance and initiate contact tracing.  • All [employer] Students will be provided daily medical checks until they are fully recovered  • [facility] students living on campus with a positive test will be provided isolation housing on campus including daily meal support.  • Questions for [employer] students: Please call [phone] to connect to an Advice Nurse both during clinic and after hours,or send a message to Ask-A-Nurse in MyStudentChart if you have questions

## 2019-06-03 LAB — COVID-19 CORONAVIRUS DETECTION ASSAY AT ~~LOC~~ LAB: COVID-19 Coronavirus Result: NOT DETECTED

## 2019-06-05 ENCOUNTER — Ambulatory Visit: Admit: 2019-06-05 | Discharge: 2019-06-05 | Disposition: A | Discharge status: Home / Self Care | Payer: Medicaid Other

## 2019-06-05 ENCOUNTER — Encounter (HOSPITAL_BASED_OUTPATIENT_CLINIC_OR_DEPARTMENT_OTHER): Admission: RE | Disposition: A | Discharge status: Home Health | Payer: Self-pay | Attending: Orthopaedic Surgery

## 2019-06-05 ENCOUNTER — Ambulatory Visit
Admission: RE | Admit: 2019-06-05 | Discharge: 2019-06-05 | Disposition: A | Discharge status: Home / Self Care | Payer: Medicaid Other | Attending: Orthopaedic Surgery | Admitting: Orthopaedic Surgery

## 2019-06-05 DIAGNOSIS — Z888 Allergy status to other drugs, medicaments and biological substances status: Secondary | ICD-10-CM | POA: Insufficient documentation

## 2019-06-05 DIAGNOSIS — M533 Sacrococcygeal disorders, not elsewhere classified: Secondary | ICD-10-CM | POA: Insufficient documentation

## 2019-06-05 DIAGNOSIS — Z885 Allergy status to narcotic agent status: Secondary | ICD-10-CM | POA: Insufficient documentation

## 2019-06-05 DIAGNOSIS — Z882 Allergy status to sulfonamides status: Secondary | ICD-10-CM | POA: Insufficient documentation

## 2019-06-05 SURGERY — PAIN INJECTION, SACROILIAC JOINT
Laterality: Bilateral

## 2019-06-05 MED ORDER — IOHEXOL 180 MG/ML IJ SOLN
INTRAMUSCULAR | Status: AC
Start: 2019-06-05 — End: 2019-06-05
  Filled 2019-06-05: qty 10

## 2019-06-05 MED ORDER — IOHEXOL 180 MG/ML IJ SOLN
INTRAMUSCULAR | Status: DC | PRN
Start: 2019-06-05 — End: 2019-06-05
  Administered 2019-06-05 (×2): .5 mL

## 2019-06-05 MED ORDER — TRIAMCINOLONE ACETONIDE 40 MG/ML IJ SUSP
INTRAMUSCULAR | Status: DC | PRN
Start: 2019-06-05 — End: 2019-06-05
  Administered 2019-06-05: 14:00:00 80 mg

## 2019-06-05 MED ORDER — TRIAMCINOLONE ACETONIDE 40 MG/ML IJ SUSP
INTRAMUSCULAR | Status: AC
Start: 2019-06-05 — End: 2019-06-05
  Filled 2019-06-05: qty 1

## 2019-06-05 MED ORDER — BUPIVACAINE HCL (PF) 0.5 % IJ SOLN
INTRAMUSCULAR | Status: DC | PRN
Start: 2019-06-05 — End: 2019-06-05
  Administered 2019-06-05: 14:00:00 4 mL

## 2019-06-05 MED ORDER — BUPIVACAINE HCL (PF) 0.5 % IJ SOLN
INTRAMUSCULAR | Status: AC
Start: 2019-06-05 — End: 2019-06-05
  Filled 2019-06-05: qty 10

## 2019-06-05 SURGICAL SUPPLY — 12 items
APPLICATOR CHLORAPREP 3ML, CLEAR (Misc Medical Supply) ×2 IMPLANT
COVER ULTRASOUND PROBE W/ GEL (Drapes/towels)
GLOVE SURGICAL BIOGEL SIZE 8 (Gloves/Gowns) ×2
MARKER SECURELINE SURG SKIN (Misc Medical Supply) ×2 IMPLANT
NEEDLE ECHOBLOCK MSK 21G X 3 1/8" (Needles/punch/cannula/biopsy) IMPLANT
NEEDLE ECHOBLOCK MSK 21G X 4" (Needles/punch/cannula/biopsy)
NEEDLE ECHOBLOCK MSK 22G X 2" (Needles/punch/cannula/biopsy)
NEEDLE SPINAL BD 22G X 3.5" (Needles/punch/cannula/biopsy) IMPLANT
NEEDLE SPINAL BD 25G X 3.5" (Needles/punch/cannula/biopsy) ×2 IMPLANT
NEEDLE SPINE QUINCKE 23G X 3.5" (Needles/punch/cannula/biopsy) ×4
NEEDLE SPINE QUINCKE 25G X 3.5" (Needles/punch/cannula/biopsy) IMPLANT
TRAY SINGLE SHOT EPIDURAL (Procedure Packs/kits) ×2 IMPLANT

## 2019-06-05 NOTE — Discharge Instructions (Signed)
Patient verbalized understanding of below instructions    Procedure Done Today: Bilateral Sacroiliac Joint Injection     Follow up with Dr Zlomislic    Post-Procedure Instructions   Take it easy for several hours after the injection until you are confident that you are not having any side effects.  You may shower today.  Do not submerge the injection site in a pool, bath, hot-tub, Jacuzzi, etc. for 24 hours after the injection.  There may be some soreness at the injection site.  Local application of ice may help to reduce soreness, and also you may take Tylenol for any local discomfort.  Patients may return to their normal activities the day after the procedure, including returning to work.  The steroids begin their maximal effect about 24-72 hours after the injection.  Overall most patients experience a noticeable improvement in their pain and quality of life, over the ensuing weeks to months.     Medication Side Effects   Temporary facial flushing with hot flashes, local whitening of the skin at the injection site, and in women trace menstrual bleeding are some possible side effects but very rare.     Injection Complications   A post-injection headache that is worse with sitting/standing up is a rare possibility. If this occurs, spend most of your time lying flat and drink plenty of fluids.    Although the procedure is generally safe and complications are rare, for a few days after injection, you should be observant for any of the following serious signs:    • Fever with temperature over 101.5 F, chills, night sweats  • New onset of severe sharp pain  • Persistent redness, swelling, discharge at the injection site  • Any new weakness or numbness in the arms or legs    These symptoms would require immediate evaluation in a hospital emergency room.  (Our service is available 24 hours per day to talk to should you have these symptoms.)    After 4:30pm Mon-Fri, or on weekends and holidays you may call the [facility] Operator at [phone] and ask for the Orthopedic resident physician on call to be contacted.  Otherwise, concerns and questions can be directed to the [facility] during business hours on Monday thru Friday.  [facility]  [address], [address]  [place], [place]. [address]  Tel: [phone] Clinic & Procedure in [place]  Tel: [phone] Procedure in [place]    Please record your average daily pain scores and bring  to your next spine center appointment. It will help to evaluate the treatment:   0= No Pain  10=Severe Pain    Call 911 IN CASE OF EMERGENCY

## 2019-06-05 NOTE — H&P (Signed)
Ambulatory Surgery/Invasive Procedure History and Physical    Primary Care Physician Karma Lew.    Chief Complaint: No chief complaint on file.       37 year old female with hx of    04-2018 DESCRIPTION OF PROCEDURE: Bilateral L4/5 and l5/S1 Lumbosacral facet joint injections with steroid and medial branch nerve block using fluoroscopic guidance      Past Medical History:   Diagnosis Date   . ADD (attention deficit disorder)    . Clavicular fracture        Allergies   Allergen Reactions   . Sulfa Drugs Nausea Only and Anxiety   . Tramadol Nausea and Vomiting       No current facility-administered medications for this encounter.        I have reviewed the patient's medications.       06/05/19  1337   BP: (!) 116/96   Resp: 16   Temp: 98 F (36.7 C)   SpO2: 100%       Physical Exam     Mental status: Can this patient make their own healthcare decisions?  Yes    Chest:  Breaths easily     Heart:  RRR    Abdomen:  Soft    Pain Management Needs/Options discussed.    No Advanced Directives.      Resuscitative Status:  Full Code, Full Care    Diagnosis:    ICD-10-CM ICD-9-CM    1. Sacroiliac joint dysfunction of both sides  M53.3 724.6        Procedure: bilateral sacroiliac joint steroid injections with xray guidance      This is the first injection of this type.     Discussed Risks, Benefits, and Alternatives to procedure.  Questions answered.  Patient voiced understanding and wished to proceed. Consent Signed.    See procedure note of same date.

## 2019-06-05 NOTE — RN OR/Procedure Note (Signed)
Pt brought to recovery via wheelchair.    Report received from Heather, RN    All VSS    Pt ready for discharge.    AVS including post procedure restrictions reviewed with patient and all questions answered.     Pt provided information related to their follow up.    Pt ambulated out of the Pain Procedure recovery area with a steady gait.

## 2019-06-05 NOTE — Op Note (Signed)
PREPROCEDURE DIAGNOSIS:  Low back pain, sacroiliac joint pain  POSTPROCEDURE DIAGNOSIS:  Low back pain, sacroiliac joint pain  PROCEDURE:  1) Sacroiliac joint injection, 2) Fluoroscopy for needle guidance.  PHYSICIAN/STAFF:  Lane Hacker, M.D., Ph.D.  ANESTHESIA:  Local   INDICATIONS:  Pain (see EPIC notes for details)in the low back over the sacroiliac joint area. I reviewed the available electronic medical records.    DESCRIPTION OF PROCEDURE: Bilateral Sacroiliac joint injection using fluoroscopic guidance    Procedure Note:  The risks (including but not limited to infection, pain, and paralysis), benefits and alternatives were described. After questions were answered, informed consent was obtained, and the patient was placed in the prone position on a fluoroscopy table. The correct side of the body for the procedure was marked. Physiologic monitors were attached. The back/sacroiliac joint area was prepped in sterile fashion. Time out protocol was called. LOCALIZATION TIME OUT:  An additional intraoperative timeout, specifically to confirm accurate localization, was conducted by the attending physician. Documentation of localization timeout was documented in ORSOS.    Under fluoroscopic (ap-oblique and lateral) and contact guidance, a 23G, 3.5 inch spinal needle was advanced to the right sacroiliac joint. This elicited pain in the patient's usual distribution.     After negative aspiration for blood, contrast media (Omnipaque 180) was infiltrated which demonstrated pooling of contrast in the joint. Then a mix of 1 ml Kenalog 40 and 4 ml bupivacaine 0.5% was instilled slowly.  Then the needle was removed.    The left side of the sacroiliac joint/back was prepped in sterile fashion. Under fluoroscopic (ap-oblique and lateral) and contact guidance, a 23G, 3.5 inch spinal needle was advanced to the left sacroiliac joint. This elicited pain in the patient's usual distribution.     After negative aspiration for blood,  contrast media (Omnipaque 180) was infiltrated which demonstrated pooling of contrast in the joint. Then a mix of 1 ml Kenalog 40 and 4 ml bupivacaine 0.5% was instilled slowly. The needle was retracted and guided to another location along the sacroiliac joint slightly caudal and lateral. A similar portion of the mixture was instilled slowly. Then the needle was removed.    There was no evidence of procedural complications. The patient tolerated the procedure well, and was able to ambulate to the recovery area for observation in stable condition without evidence of subjective or objective motor loss. The patient was discharged with written instructions.    Radiation dose summary information details are provided in the transmitted radiology files.    Plan of Care:  Patient advised to contact the spine center and go to the local emergency room for serious complications. Patient to follow up with Dr Radene Knee in 1-2 weeks.

## 2019-06-05 NOTE — RN OR/Procedure Note (Signed)
..  Pt brought to procedure room via wheelchair.   Report received from Joyce RN  Consent signed with Dr. Chang prior to procedure  Pt able to position self on procedure table without assistance or difficulty.  Pt placed on NIBP and Spo2 for monitoring  All VSS  Pt tolerated procedure well.  Pt assisted off procedure table to taken to recovery area via wheelchair.

## 2019-06-08 ENCOUNTER — Encounter (INDEPENDENT_AMBULATORY_CARE_PROVIDER_SITE_OTHER): Payer: Self-pay | Admitting: Orthopaedic Surgery of the Spine

## 2019-06-09 NOTE — Telephone Encounter (Signed)
From: [name]  To: [name], MD  Sent: 97/05/8920 1:52 PM PDT  Subject: 1-Non Urgent Medical Advice    Hi Doctor Z,  You asked me to let you know how I was doing after my steroid injections and I had them this Monday. I really dont dont think my SI joints are what is causing me so much pain. Where doctor Chang injected I could feel it and my bum basically felt numb for a couple days, but if anything it made it even more clear that the main pain Im feeling is much higher than that. It can travel down a bit but that main bad pain is still there. It still feels like something is wrong, like something is pressing on a nerve or something it shouldnt be. My left leg has taken to almost always feeling dead below the knee. I can feel this somewhat in both legs but as usual the left is worse. I can feel this shift a bit as I change positions and feel it often when laying down but Im feeling it more often than not these days. It seems to alternate between pain/numbness (or sometimes a trembling feeling) or just kinda feeling dead in my lower legs. Sometimes the pain that I feel acutely right in my   back gets set off in the seated position especially bending forwards. Im wondering if there are any other x-rays in a different position or diagnostics I could try to get taken care of before my next scheduled appointment on October 21st? And/or if I should see if you have any open appointments before that? I know you said you couldnt see anything significant in what we have already and everything my body is telling me is that something is definitely still not right.     Thanks,  [name]

## 2019-06-13 ENCOUNTER — Encounter (INDEPENDENT_AMBULATORY_CARE_PROVIDER_SITE_OTHER): Payer: Self-pay | Admitting: Orthopaedic Surgery of the Spine

## 2019-06-13 ENCOUNTER — Telehealth (HOSPITAL_BASED_OUTPATIENT_CLINIC_OR_DEPARTMENT_OTHER): Payer: Self-pay | Admitting: Orthopaedic Surgery

## 2019-06-13 DIAGNOSIS — Z981 Arthrodesis status: Secondary | ICD-10-CM

## 2019-06-13 NOTE — Telephone Encounter (Signed)
Mahogany from [employer] to check stautas if [facility] received for pt  fax to [phone], Mahogany dose not now when it was faxed      Best can be reach for [facility] [phone] ex [phone]      Please advise, thank you.

## 2019-06-14 ENCOUNTER — Inpatient Hospital Stay (INDEPENDENT_AMBULATORY_CARE_PROVIDER_SITE_OTHER): Admit: 2019-06-14 | Discharge: 2019-06-14 | Disposition: A | Discharge status: Home Health | Payer: Medicaid Other

## 2019-06-14 ENCOUNTER — Ambulatory Visit (INDEPENDENT_AMBULATORY_CARE_PROVIDER_SITE_OTHER): Payer: Medicaid Other | Admitting: Orthopaedic Surgery of the Spine

## 2019-06-14 VITALS — BP 138/90 | HR 67 | Temp 97.6°F | Ht 70.0 in | Wt 169.8 lb

## 2019-06-14 DIAGNOSIS — Z981 Arthrodesis status: Secondary | ICD-10-CM

## 2019-06-14 DIAGNOSIS — M48061 Spinal stenosis, lumbar region without neurogenic claudication: Secondary | ICD-10-CM

## 2019-06-14 DIAGNOSIS — M4316 Spondylolisthesis, lumbar region: Secondary | ICD-10-CM

## 2019-06-14 DIAGNOSIS — G6289 Other specified polyneuropathies: Secondary | ICD-10-CM

## 2019-06-14 MED ORDER — PREGABALIN 75 MG OR CAPS
75.0000 mg | ORAL_CAPSULE | Freq: Two times a day (BID) | ORAL | 0 refills | Status: DC
Start: 2019-06-14 — End: 2019-10-26

## 2019-06-14 MED ORDER — DULOXETINE HCL 30 MG OR CPEP
30.0000 mg | ORAL_CAPSULE | Freq: Two times a day (BID) | ORAL | 1 refills | Status: DC
Start: 2019-06-14 — End: 2019-07-03

## 2019-06-14 MED ORDER — ZOLPIDEM TARTRATE 5 MG OR TABS
5.0000 mg | ORAL_TABLET | Freq: Every evening | ORAL | 0 refills | Status: DC | PRN
Start: 2019-06-14 — End: 2019-12-07

## 2019-06-14 NOTE — Progress Notes (Signed)
ORTHOPAEDIC SPINE SURGERY     Visit Type: Office Visit    Reason For Visit: Back Pain (Patient had esi injection with  no relief.  She feels like she is progressively getting worse.)    Surgery Date: 01/03/2019    Procedure: ROI, revision XLIF L4-5 and PSIF L4-5     History Of Present Illness: 38 year old female returns to spine clinic now approximately 6 months s/p revision lumbar fusion L4-5 as noted above. She underwent bilateral SI joint injections last week and reports no improvement to her symptoms. She continues to report that she is in constant pain with daily pain in focal areas of her legs L>R. Most recently in the region of her achilles. She requires hydrocodone in the evening. The pain is very limiting and requires her to moderate her activities. She reports that shie is unable to do much activity beyond sitting down at home. Denies any radicular type pain or weakness or numbness. No issues with incisions. Patient rates her pain as a 7. Patient reports no changes in bowel and bladder function. Patient returns for routine follow up.    Allergies:   Allergies   Allergen Reactions   . Sulfa Drugs Nausea Only and Anxiety   . Tramadol Nausea and Vomiting       Medications:   Current Outpatient Medications   Medication Sig   . acetaminophen (TYLENOL) 325 MG tablet Take 3 tablets (975 mg) by mouth every 8 hours.   . ALPRAZolam (XANAX) 0.25 MG tablet take 1 tablet by mouth once daily if needed   . Amphetamine-Dextroamphetamine (ADDERALL PO) as needed.   . clindamycin (CLEOCIN T) 1 % solution Apply 1 Application topically 2 times daily. Use a small amount as directed   . clindamycin (CLEOCIN) 300 MG capsule Take 1 capsule (300 mg) by mouth 3 times daily.   . cyclobenzaprine (FLEXERIL) 5 MG tablet Take 1 tablet (5 mg) by mouth 3 times daily as needed for Muscle Spasms.   . diazepam (VALIUM) 5 MG tablet Take 1 tablet (5 mg) by mouth every 6 hours as needed for Insomnia or Muscle Spasms.   . diclofenac (VOLTAREN) 1 %  gel Apply 1 g topically 4 times daily.   . drospirenone-ethinyl estradiol (YAZ) 3-0.02 MG tablet Take 1 tablet by mouth daily.   Marland Kitchen. gabapentin (NEURONTIN) 300 MG capsule Take 1 capsule (300 mg) by mouth 3 times daily.   Marland Kitchen. HYDROcodone-acetaminophen (NORCO) 5-325 MG tablet Take 1 tablet by mouth every 6 hours as needed for Moderate Pain (Pain Score 4-6).   Marland Kitchen. tiZANidine (ZANAFLEX) 4 MG tablet Take 1 tablet (4 mg) by mouth every 6 hours as needed (spasm).   Marland Kitchen. zolpidem (AMBIEN) 5 MG tablet Take 1 tablet (5 mg) by mouth nightly as needed for Insomnia.     No current facility-administered medications for this visit.          Review of Systems:  CONSTITUTIONAL: No unexpected weight loss, fevers, chills, or anorexia. SKIN: Noncontributory. GENITOURINARY: Symptoms as noted above in HPI. NEUROLOGIC: Symptoms as noted above in HPI. MUSCULOSKELETAL: Symptoms as noted above in HPI.    Physical Examination:  BP 138/90 (BP Location: Left arm, BP Patient Position: Sitting, BP cuff size: Regular)   Pulse 67   Temp 97.6 F (36.4 C) (Temporal)   Ht 5\' 10"  (1.778 m)   Wt 77 kg (169 lb 12.1 oz)   LMP 05/22/2019   BMI 24.36 kg/m   CONSTITUTIONAL: Well appearing and well  groomed. PSYCHOLOGICAL: Alert and oriented and appropriate to situation. INTEGUMENT: Intact. VASCULAR: Radial and pedal pulses are intact and symmetric. EXTREMITIES: Bilateral upper and lower extremities have good range of motion and no significant deformities. GAIT: Brisk with good coordination.     CERVICAL SPINE: Nontender to palpation. Spurling's test negative. Sensation intact of the upper extremities.    RANGE OF MOTION (in degrees)       RT LT  Lateral motion   80  80  Lateral bend   45  45  Flexion 40 degrees. Extension 35 degrees. Motion in all planes is nonpainful.    MOTOR    RT LT  Trapezius   5/5  5/5  Deltoid    5/5  5/5  Bicep    5/5  5/5  Tricep    5/5  5/5  Wrist flexor   5/5  5/5  Wrist extensor  5/5  5/5  Intrinsics   5/5  5/5  Grip    5/5   5/5    DEEP TENDON REFLEXES       RT  LT  Bicep    2+  2+  Brachioradialis  2+  2+  Tricep    2+  2+    THORACIC SPINE  Nontender to percussion. Alignment - normal, no paravertebral muscle fullness. Sensation intact. Beevor's negative. Abdominal reflexes are absent and symmetric.     LUMBAR SPINE  Left lumbosacral, paraspinal and SI joint tenderness to palpation. Heel to toe walk with difficulty due to pain. Sensation intact of the lower extremities. Trendelenburg sign negative. Skin intact.    RANGE OF MOTION        RT  LT  lateral bend, normal at 25 degrees 25  25  rotation, normal at 30 degrees 30  30  Flexion - normal at 40 degrees. Extension - normal at 10 degrees.    MOTOR STRENGTH       RT  LT  Iliopsoas   5/5  5/5  Quadricep   5/5  5/5  Anterior tibialis   5/5  5/5  Extensor hallucis longus 5/5  5/5  Gastrocsoleus  5/5  5/5    DEEP TENDON REFLEXES       RT  LT  Patellar   2+  2+  Achilles   2+  2+    PATHOLOGIC REFLEXES      RT LT  Clonus    Neg Neg  Babinski   Neg Neg  Hoffmans   Neg Neg    STRAIGHT LEG RAISING       RT  LT  Sitting @ 90 degrees   Neg Neg    IMAGING STUDIES:   X-ray Lumbosacral Spine 2 Or 3 Views    Result Date: 06/14/2019  IMPRESSION: Posterior decompression and combined anterior and posterior instrumented fixation at L4-L5 without evidence of hardware complication. 7 mm anterolisthesis of L4 over L5 without change between the lateral flexion-extension views.    X-ray Lumbosacral Spine 2 Or 3 Views    Result Date: 06/14/2019  IMPRESSION: No interval change. Status post L4-L5 pedicle screws instrumentation, anterior interbody grafting and posterior bone grafting after decompression without change in alignment or hardware complication.        OTHER MEDICAL RECORDS/IMAGING STUDIES REVIEWED: None    IMPRESSION:   No diagnosis found.     PLAN/DISCUSSION: Reviewed clinical history as well as exam results and imaging findings with patient in detail. Imaging shows intact instrumentation with  anatomic alignment and  no evidence of stenosis. No dynamic motion of spondylolisthesis on flex/ex XR. However pt continues to have ongoing back pain and focal areas of leg pain. No improvement after B SI J injections. Unclear etiology of sxs but they may have a psychosomatic component. Will try cymbalta and change gabapentin to lyrica. Will continue observation and follow up in 6 weeks. Continue to hold off any active PT for now, and she may continue to moderate her activities based on her symptoms.  All questions were answered and Teagen Bucio understood and was satisfied with this plan.     FOLLOWUP: Return to clinic: six weeks The patient is encouraged to call us with any questions or problems in the interim. Our contact numbers were given to the patient.

## 2019-06-16 ENCOUNTER — Telehealth (HOSPITAL_BASED_OUTPATIENT_CLINIC_OR_DEPARTMENT_OTHER): Payer: Self-pay | Admitting: Neurological Surgery

## 2019-06-16 NOTE — Telephone Encounter (Signed)
Patient is scheduled for consult with:  Dr. Annie Main on 07/11/19 at 11am. Requests MD by name.     Diagnosis S/P lumbar fusion    Referring Provider: Self-referral: Notes available in [place] with Ortho MD, Dr. Lelon Huh 11/[birth year]57    Patient's PCP: Dr.Asghar    Internal or External Referral: External - Self        Was patient given the following imaging instructions: yes    Where did patient have imaging done? [facility]    Please note pt is SELF PAY and not going through [place]. Additionally, pt will be transitioning to [facility] plan effective next week. She states she will call back to re-run insurance but is also OK with doing self pay.

## 2019-06-16 NOTE — Telephone Encounter (Signed)
Patient is scheduled for in-clinic appointment.    Appointment scheduled for 07/11/19    Do you have or have you had in the last 24 hours:   ·         Fever:  no  ·         New cough (not chronic) : no  ·         Shortness of breath: no  *Refer patient to PCP if YES for any flu like symptoms           2. Have you knowingly been exposed to anyone having any, some or all of the symptoms listed above?  no     3. Have you traveled  outside of the [place] in the last 14 days? .  no     If Yes to any of these, schedule and or reschedule existing appointment 14 + days out     4.    Are you experiencing any new urgent Neurological symptoms? no  If Yes to # 4, provide symptoms below using .sccsymptoms smart phrase and route to site specific triage.    If no to # 4, document encounter and close.  Do not route.    Did you inform patient of no visitor policy yes    Will the patient require a visitor? no    If Yes: document visitor's full name in appointment notes.

## 2019-06-17 ENCOUNTER — Encounter (INDEPENDENT_AMBULATORY_CARE_PROVIDER_SITE_OTHER): Payer: Self-pay | Admitting: Orthopaedic Surgery of the Spine

## 2019-06-17 DIAGNOSIS — M48061 Spinal stenosis, lumbar region without neurogenic claudication: Secondary | ICD-10-CM

## 2019-06-17 DIAGNOSIS — Z981 Arthrodesis status: Secondary | ICD-10-CM

## 2019-06-19 MED ORDER — HYDROCODONE-ACETAMINOPHEN 5-325 MG OR TABS
1.0000 | ORAL_TABLET | Freq: Four times a day (QID) | ORAL | 0 refills | Status: DC | PRN
Start: 2019-06-19 — End: 2019-11-02

## 2019-06-19 NOTE — Telephone Encounter (Signed)
From: [name]  To: [name], MD  Sent: 31/54/0086 10:25 AM PDT  Subject: 2-Procedural Question    Hi,    I need prior authorization for the Lyrica and I am also going to need to refill the hydrocodone. My body is starting to just shake when I'm in too much pain. I'm taking the gabapentin in the meantime but it's still really bad.    Thanks,  [name]

## 2019-06-22 ENCOUNTER — Encounter (HOSPITAL_BASED_OUTPATIENT_CLINIC_OR_DEPARTMENT_OTHER): Payer: Self-pay | Admitting: Neurological Surgery

## 2019-06-22 DIAGNOSIS — M545 Low back pain, unspecified: Secondary | ICD-10-CM

## 2019-06-23 ENCOUNTER — Telehealth (INDEPENDENT_AMBULATORY_CARE_PROVIDER_SITE_OTHER): Payer: Self-pay | Admitting: Orthopaedic Surgery of the Spine

## 2019-06-23 NOTE — Telephone Encounter (Signed)
Pt states she was told she might have a metal allergy by Dr. Lelon Huh at last visit. Pt wants to know what she should be asking her PCP to possible test for this. Please advise.  Thank you.

## 2019-06-24 ENCOUNTER — Encounter (INDEPENDENT_AMBULATORY_CARE_PROVIDER_SITE_OTHER): Payer: Self-pay | Admitting: Orthopaedic Surgery of the Spine

## 2019-06-25 ENCOUNTER — Encounter (INDEPENDENT_AMBULATORY_CARE_PROVIDER_SITE_OTHER): Payer: Self-pay | Admitting: Orthopaedic Surgery of the Spine

## 2019-06-26 ENCOUNTER — Encounter (INDEPENDENT_AMBULATORY_CARE_PROVIDER_SITE_OTHER): Payer: Self-pay | Admitting: Orthopaedic Surgery of the Spine

## 2019-06-26 NOTE — Telephone Encounter (Signed)
From: [name]  To: [name], MD  Sent: 16/83/7290 12:35 PM PDT  Subject: 3-Referral Request Status    Also,     Could you please prescribe Percocet instead of the hydrocodone? I haven't even picked up that bottle of hydrocodone yet if that's helpful for you. You could cancel it or whatever and switch it. I was taking what I have, but that is not going to cut it for the pain and I need to take care of myself until I can get this fixed.    Thank you,  [name]

## 2019-06-26 NOTE — Telephone Encounter (Signed)
From: [name]  To: [name], MD  Sent: 41/14/6431 4:15 PM PDT  Subject: 3-Referral Request Status    Hi Doctor Z and team,    Can Doctor Z please order an allergy panel on metals that might be found in medical devices for me?    Thank you,  Haley Barrett

## 2019-06-26 NOTE — Telephone Encounter (Signed)
From: [name]  To: [name], MD  Sent: 70/09/7492 12:43 PM PDT  Subject: 3-Referral Request Status    Ok I just had them do a general allergy test but I need to know what metals to even have them test for.      ----- Message -----   From:Irina Ralene Ok, NP   Sent:06/26/2019 10:54 AM PDT   WH:[id number] Pak   Subject:RE: 3-Referral Request Status    Hi Elonna,  The pain meds and the allergy testing has to come from your PCP. They will have to refer your to an allergy clinic. Dr. Earnest Conroy wanted you to try cymbalta and lyrica but not oxycodone.   Renato Gails, NP      ----- Message -----   From:Davelyn Jann   Sent:06/24/2019 4:15 PM PDT   MB:WGYKZ Zlomislic, MD   [id number] Request Status    Hi Doctor Z and team,    Can Doctor Z please order an allergy panel on metals that might be found in medical devices for me?    Thank you,  [name]

## 2019-06-27 ENCOUNTER — Ambulatory Visit (HOSPITAL_BASED_OUTPATIENT_CLINIC_OR_DEPARTMENT_OTHER): Payer: Medicaid Other | Admitting: Neurological Surgery

## 2019-06-28 ENCOUNTER — Encounter (INDEPENDENT_AMBULATORY_CARE_PROVIDER_SITE_OTHER): Payer: Medicaid Other | Admitting: Orthopaedic Surgery of the Spine

## 2019-06-29 NOTE — Telephone Encounter (Signed)
Left msg suggesting to have patient's PCP refer her to an Allergist for testing.

## 2019-07-03 ENCOUNTER — Other Ambulatory Visit (HOSPITAL_BASED_OUTPATIENT_CLINIC_OR_DEPARTMENT_OTHER): Payer: Self-pay | Admitting: Orthopaedic Surgery of the Spine

## 2019-07-03 DIAGNOSIS — G6289 Other specified polyneuropathies: Secondary | ICD-10-CM

## 2019-07-03 DIAGNOSIS — Z981 Arthrodesis status: Secondary | ICD-10-CM

## 2019-07-03 MED ORDER — DULOXETINE HCL 30 MG OR CPEP
30.0000 mg | ORAL_CAPSULE | Freq: Two times a day (BID) | ORAL | 1 refills | Status: DC
Start: 2019-07-03 — End: 2019-11-02

## 2019-07-03 NOTE — Telephone Encounter (Signed)
Received 90 day refill req for cymbalta 30 mg. Asking for qty 180  Last OV: 06/14/2019  rx placed: 06/14/2019

## 2019-07-04 ENCOUNTER — Ambulatory Visit (HOSPITAL_BASED_OUTPATIENT_CLINIC_OR_DEPARTMENT_OTHER): Payer: Medicaid Other | Admitting: Neurological Surgery

## 2019-07-04 ENCOUNTER — Ambulatory Visit
Admission: RE | Admit: 2019-07-04 | Discharge: 2019-07-04 | Disposition: A | Discharge status: Home / Self Care | Payer: Medicaid Other | Attending: Family | Admitting: Family

## 2019-07-04 VITALS — BP 147/97 | HR 91 | Temp 98.3°F | Resp 18 | Ht 70.0 in | Wt 175.0 lb

## 2019-07-04 DIAGNOSIS — M545 Low back pain, unspecified: Secondary | ICD-10-CM

## 2019-07-04 DIAGNOSIS — Z9889 Other specified postprocedural states: Secondary | ICD-10-CM

## 2019-07-04 DIAGNOSIS — Z4789 Encounter for other orthopedic aftercare: Secondary | ICD-10-CM

## 2019-07-04 DIAGNOSIS — Z981 Arthrodesis status: Secondary | ICD-10-CM

## 2019-07-04 MED ORDER — OXYCODONE-ACETAMINOPHEN 5-325 MG OR TABS
1.00 | ORAL_TABLET | Freq: Four times a day (QID) | ORAL | Status: DC | PRN
Start: ? — End: 2019-11-04

## 2019-07-04 NOTE — Progress Notes (Addendum)
NEUROSURGERY HISTORY AND PHYSICAL     Attending MD:   [name], MD    Referring Provider:   Self-Referred     Chief Complaint:   Lumbar Back Pain.      TREATMENT HISTORY:  07/2018  [place]bar [place]   01/03/2019 ROI, revision XLIF L4-5 and PSIF L4-5 (Dr Neta MendsZlomislik)    History of Present Illness:     Haley Barrett is a  38 year old year-old female who presents today to clinic with a history of  Low Back Pain s/p ROI, revision XLIF L4-5 and PSIF L4-5 on 01/03/2019 with Dr Zlomislic (Orthopedics). Patient states that she is slightly better after her second surgery with Dr Zlomislic.  She states that her back feels more stable, however she continues to have upper thoracic parascapular back pain.  She also endorses right sided tricep pain which is occasional. She also endorses lowe back pulling and burning pain. Bilateral foot discoloration and pain.  Some weakness L>R legs.  ? Left sided foot drop. Numbness/tingling in bilateral pinkies.    No loss of upper extremity function or lower extremity function. No loss of dexterity.  Patient rates her pain as 7-10/10.  She is taking Percocet, Gabapentin 1000mg  a day and Alleve.  Pain is described as aching, stabbing and  burning. Patient has some difficulties walking.  Has to walk leaning forward to have any relief of her pain.  She cannot walk > 1/2 block without excruciating pain. The problem has gotten worse in the past 2-3 weeks.  Pain is made worse by moving.  Pain is reduced by lying down and medications, ice packs. Patient has not been able to participate in physical therapy due to her severe pain. She has tried SI injections in the recent past which did not help. Patient denies any urinary incontinence or problem with bowel.No EMG studies.       05/17/19 Interval History Orthopedics CV note:   38 year old female returns to spine clinic now approximately 16 weeks s/p revision lumbar fusion L4-5 as noted above. She is much improved compared to her  prior visit and states that she is much better. She continues to hold off any PT. She states that she has been able to do certain things which she had not been able to since last February, including going out to dinner ane walking about 2 miles. She does complain of some muscle pain and soreness the day after she does too much, and she does try to moderate her activities accordingly.  She localizes pain to the the left greater than right lumbosacral and SI joint region. Denies any radicular pain or weakness or numbness. No issues with incisions. Patient rates her pain as 4  . Patient reports no changes in bowel and bladder function. Patient returns for routine follow up.    Past Medical and Surgical History:  Past Medical History:   Diagnosis Date   . ADD (attention deficit disorder)    . Clavicular fracture      Past Surgical History:   Procedure Laterality Date   . BACK SURGERY  07/28/2018    In [place]   . HUMERUS FRACTURE SURGERY Left 1993   . ------------OTHER-------------  [age] yo    humerus ORIF        Allergies:  Allergies   Allergen Reactions   . Sulfa Drugs Nausea Only and Anxiety   . Tramadol Nausea and Vomiting       Medications:  Current  Outpatient Medications   Medication Sig   . acetaminophen (TYLENOL) 325 MG tablet Take 3 tablets (975 mg) by mouth every 8 hours.   . ALPRAZolam (XANAX) 0.25 MG tablet take 1 tablet by mouth once daily if needed   . Amphetamine-Dextroamphetamine (ADDERALL PO) as needed.   . clindamycin (CLEOCIN T) 1 % solution Apply 1 Application topically 2 times daily. Use a small amount as directed   . clindamycin (CLEOCIN) 300 MG capsule Take 1 capsule (300 mg) by mouth 3 times daily.   . cyclobenzaprine (FLEXERIL) 5 MG tablet Take 1 tablet (5 mg) by mouth 3 times daily as needed for Muscle Spasms.   . diazepam (VALIUM) 5 MG tablet Take 1 tablet (5 mg) by mouth every 6 hours as needed for Insomnia or Muscle Spasms.   . diclofenac (VOLTAREN) 1 % gel Apply 1 g topically 4 times daily.    . drospirenone-ethinyl estradiol (YAZ) 3-0.02 MG tablet Take 1 tablet by mouth daily.   . DULoxetine (CYMBALTA) 30 MG CR capsule Take 1 capsule (30 mg) by mouth 2 times daily.   Marland Kitchen HYDROcodone-acetaminophen (NORCO) 5-325 MG tablet Take 1 tablet by mouth every 6 hours as needed for Moderate Pain (Pain Score 4-6).   . pregabalin (LYRICA) 75 MG capsule Take 1 capsule (75 mg) by mouth 2 times daily.   Marland Kitchen tiZANidine (ZANAFLEX) 4 MG tablet Take 1 tablet (4 mg) by mouth every 6 hours as needed (spasm).   Marland Kitchen zolpidem (AMBIEN) 5 MG tablet Take 1 tablet (5 mg) by mouth nightly as needed for Insomnia.     No current facility-administered medications for this visit.        Vital Signs:  There were no vitals taken for this visit.  BP (!) 147/97 (BP Location: Left arm, BP Patient Position: Sitting, BP cuff size: Regular)   Pulse 91   Temp 98.3 F (36.8 C) (Temporal)   Resp 18   Ht  (1.778 m)   Wt 79.4 kg (175 lb)   BMI 25.11 kg/m     Social History:  Social History     Socioeconomic History   . Marital status: Unknown     Spouse name: Not on file   . Number of children: Not on file   . Years of education: Not on file   . Highest education level: Not on file   Occupational History   . Not on file   Tobacco Use   . Smoking status: Never Smoker   . Smokeless tobacco: Never Used   Substance and Sexual Activity   . Alcohol use: Yes     Comment: socially, couple drinks/week    . Drug use: Yes     Types: Marijuana     Comment: edibles   . Sexual activity: Not Currently     Partners: Male     Birth control/protection: Pill   Social Activities of Daily Living Present   . Not on file   Social History Narrative    Single    Psychotherapist - just beginning, in private practice. Went to school in [place].    Born in [place] and moved to [place] 6 years ago    Tour guide-food and history tours     Hobbies: beach volleyball, horse back riding, exercise        Family History:  Family History   Problem Relation Name Age of  Onset   . Diabetes Paternal Uncle     . Breast Cancer  Paternal Aunt     . Breast Cancer Mother  66   . Breast Cancer Paternal Grandmother     . Colon Cancer Neg Hx     . Hypertension Neg Hx     . Cholesterol/Lipid Disorder Neg Hx          Review of Systems:  Per HPI, All other review of systems were discussed and reported negative    Physical Exam:  NEUROLOGIC EXAMINATION:     General: The patient is in no acute distress.   HEENT: Head is normocephalic, atraumatic, conjunctivae without icterus, oropharynx is patent without erythema or edema.   Neck: Supple, non tender c-spine, trachea is midline.   Extremities: No clubbing, cyanosis, or edema  Neurological examination:   Mental status: The patient is alert & oriented x3.   Intact recent and remote memory. Appropriate mood and affect.   Normal attention span and concentration.   Speech and language intact, no hoarseness or stridor, conversant without signs of aphasia or neglect. Good fund of knowledge.     Cranial Nerves: Pupils are equal, round and reactive to light and accommodation bilaterally, extraocular movements intact, no nystagmus noted, visual field intact to confrontation.  Face is symmetric, no ptosis noted  Hearing intact to finger rub, tongue and uvula is midline  Tone normal, no tremor. No muscle atrophy noted    MOTOR STRENGTH                  Upper extremeties: RUE LUE Lower extremities: RLE LLE   Deltoid 5/5 5/5 Iliopsoas 5/5 5/5   Biceps 5/5 5/5 Quadriceps 5/5 5/5   Triceps 5/5 5/5 Biceps Femoris 5/5 5/5   Wrist flexion  5/5 5/5      Wrist extension 5/5 5/5            Hand grip 5/5 5/5 Tibialis anterior 5/5 5/5   PIP extension 5/5 5/5 Gastrocnemius 5/5 5/5   Intrinsics 5/5 5/5 EHL 5/5 5/5       DEEP TENDON REFLEXES                  Upper extremities: RUE LUE Lower extremities: RLE LLE   Biceps 2+ 2+ Patellar 3+ 2+   Triceps 2+ 2+ Ankle 2+ 2+   Brachioradialis 2+ 2+              Sensory intact throughout   Intact finger-to-nose   Romberg's and  Pronator drift negative   Gait is normal  Single leg stance-  able on R/L   Able to perform tandem walk    RADIOLOGY REVIEW:                      06/14/19 Lumbar X-Ray   IMPRESSION:  Posterior decompression and combined anterior and posterior instrumented fixation at L4-L5 without evidence of hardware complication. 7 mm anterolisthesis of L4 over L5 without change between the lateral flexion-extension views.    07/04/19 Full Spine X-Rays   IMPRESSION:  Neutral coronal balance. Positive sagittal balance as described.    No significant scoliotic curvature.    Suboptimal assessment of L4/L5 fusion on this survey technique. Suggestion of mild lucencies about the L4 pedicle screws. Recommend attention on follow-up.  A/P:  38 year old female with Low Back Pain s/p ROI, revision XLIF L4-5 and PSIF L4-5 on 01/03/2019 with Dr Zlomislic (Orthopedics). Patient states that she is slightly better after her second surgery with Dr Zlomislic.  She states that  her back feels more stable, however she continues to have upper thoracic parascapular back pain.  She also endorses right sided tricep pain which is occasional.     - Unfortunately we do not have all the radiographic evidence that we need in order to determine whether not her pain is coming from a pseudoarthrosis or a spinal imbalance.   - CT of lumbar spine ordered to evaluate for hardware failure and to see if there is any lucency around the screws to suggest that she is having a pseudoarthrosis or nonunion.   - Lumbar CT ordered for further evaluation of patients presenting symptoms.   - Consultation with pain service recommended.   - RTC once imaging completed.   - All questions answered during this visit.  - Patient encouraged to call the office with worsening neurological symptoms, questions and/or concerns.       The supervising physician responsible for the care of this patient is Dr. Lysle Dingwall    Noah Delaine, NP-C  Department of Neurosurgery   [facility]   Email: [email or web address]

## 2019-07-05 ENCOUNTER — Encounter (INDEPENDENT_AMBULATORY_CARE_PROVIDER_SITE_OTHER): Payer: Medicaid Other | Admitting: Orthopaedic Surgery of the Spine

## 2019-07-05 ENCOUNTER — Telehealth (HOSPITAL_BASED_OUTPATIENT_CLINIC_OR_DEPARTMENT_OTHER): Payer: Self-pay | Admitting: Neurological Surgery

## 2019-07-05 NOTE — Telephone Encounter (Signed)
1. Do you have or have you had in the last 24 hours:     Fever: no    New Cough: no    Shortness Of Breath: no    *Refer to PCP us YES for any flue like symptoms     2. Have you knowingly been exposed to anyone having any, some or all of the above symptoms listed? no    3. have you traveled outside of the [place] in the last 14 days? no    If Yes to any of these, schedule and or reschedule existing appointment 14 + days out  4.  Are you experiencing any new urgent neurological symptoms? no    If Yes to # 4, provide symptoms below using .sccsymptoms smart phrase and route to site specific triage.     If no to # 4, document encounter and close.  Do not route.     Scripting if patient schedules, reschedules or keeps appointment:   [facility] takes health risks associated with respiratory illnesses, including influenza and the novel coronavirus, very seriously. As such, we are implementing strict visitor restrictions. No visitors of any age will be permitted to visit our hospitals or clinics until further notice. Request for exceptions may be made by contacting the clinic or unit in which loved ones are receiving care.

## 2019-07-08 ENCOUNTER — Emergency Department
Admission: EM | Admit: 2019-07-08 | Discharge: 2019-07-08 | Disposition: A | Discharge status: Home / Self Care | Payer: Medicaid Other | Attending: Emergency Medicine | Admitting: Emergency Medicine

## 2019-07-08 DIAGNOSIS — F419 Anxiety disorder, unspecified: Secondary | ICD-10-CM | POA: Insufficient documentation

## 2019-07-08 DIAGNOSIS — Z882 Allergy status to sulfonamides status: Secondary | ICD-10-CM | POA: Insufficient documentation

## 2019-07-08 DIAGNOSIS — F988 Other specified behavioral and emotional disorders with onset usually occurring in childhood and adolescence: Secondary | ICD-10-CM | POA: Insufficient documentation

## 2019-07-08 DIAGNOSIS — F1290 Cannabis use, unspecified, uncomplicated: Secondary | ICD-10-CM | POA: Insufficient documentation

## 2019-07-08 DIAGNOSIS — Z885 Allergy status to narcotic agent status: Secondary | ICD-10-CM | POA: Insufficient documentation

## 2019-07-08 DIAGNOSIS — M545 Low back pain, unspecified: Secondary | ICD-10-CM

## 2019-07-08 DIAGNOSIS — Z888 Allergy status to other drugs, medicaments and biological substances status: Secondary | ICD-10-CM | POA: Insufficient documentation

## 2019-07-08 DIAGNOSIS — Z803 Family history of malignant neoplasm of breast: Secondary | ICD-10-CM | POA: Insufficient documentation

## 2019-07-08 DIAGNOSIS — Z833 Family history of diabetes mellitus: Secondary | ICD-10-CM | POA: Insufficient documentation

## 2019-07-08 DIAGNOSIS — G8929 Other chronic pain: Secondary | ICD-10-CM | POA: Insufficient documentation

## 2019-07-08 MED ORDER — ONDANSETRON 4 MG OR TBDP
4.0000 mg | ORAL_TABLET | Freq: Once | ORAL | Status: AC
Start: 2019-07-08 — End: 2019-07-08
  Administered 2019-07-08: 20:00:00 4 mg via ORAL
  Filled 2019-07-08: qty 1

## 2019-07-08 MED ORDER — HYDROXYZINE HCL 25 MG OR TABS
25.0000 mg | ORAL_TABLET | Freq: Once | ORAL | Status: AC
Start: 2019-07-08 — End: 2019-07-08
  Administered 2019-07-08 (×2): 25 mg via ORAL
  Filled 2019-07-08: qty 1

## 2019-07-08 MED ORDER — LIDOCAINE 4 % EX PTCH
1.0000 | MEDICATED_PATCH | Freq: Once | CUTANEOUS | Status: DC
Start: 2019-07-08 — End: 2019-07-09
  Administered 2019-07-08 (×2): 1 via TRANSDERMAL
  Filled 2019-07-08: qty 1

## 2019-07-08 MED ORDER — HYDROCODONE-ACETAMINOPHEN 5-325 MG OR TABS
1.0000 | ORAL_TABLET | Freq: Once | ORAL | Status: AC
Start: 2019-07-08 — End: 2019-07-08
  Administered 2019-07-08: 20:00:00 1 via ORAL
  Filled 2019-07-08: qty 1

## 2019-07-08 MED ORDER — CYCLOBENZAPRINE HCL 5 MG OR TABS
5.0000 mg | ORAL_TABLET | Freq: Three times a day (TID) | ORAL | 0 refills | Status: AC | PRN
Start: 2019-07-08 — End: 2019-07-13

## 2019-07-08 NOTE — ED Provider Notes (Signed)
Emergency Department Note  [facility] electronic medical record reviewed for pertinent medical history.     Patient: Haley Barrett, MRN [id number], DOB 1981-03-08  The Date of Service for the Emergency Room encounter is 07/08/2019  7:06 PM     Nursing Triage Note :   Chief Complaint   Patient presents with   . Nausea     38 y/o female reporting she has had 3 weeks of nausea, headache, dizziness, abdominal discomfort. reports 2 days of "discoloration of hands and feet" PMH lumbar fusion, spinal stenosis, neurogenic claudication. aaox4 speaking clear sentences. no facial droop, no unilateral weakness.    Marland Kitchen Headache- Recurrent Or Known Dx Migraines   . Dizziness       HPI :   Tajia Szeliga is a 38 year old female with PMHx as below notable for chronic low back pain, anxiety, and prior experience a lumbar surgery in [place] in November of 2019, and then ROI, revision XLIF L4-5 and PSIF L4-5 (Dr Theodosia Paling) on 01/03/2019 who presents to the emergency department with acute on chronic low back pain.  Patient has multiple complaints, but primarily states that she is having severe low back pain resistant to her home medications.  Tells me that she took a Percocet today, which did not relieve her symptoms.  Tells me she has also been taking Motrin and Tylenol without relief.  Patient is tearful, hyperventilating, and at times difficult to obtain history from.  She is requesting something stronger for pain.  She denies any numbness, tingling, or lower extremity weakness.  There is no saddle anesthesia, patient specifically denies any bowel or bladder incontinence.  Denies any falls or trauma.  Of note, the patient is on neuro surgery in the clinic 4 days ago, and at that time was noticed to have persistent uncontrolled pain, and she was recommended to trial lyrica, which she has been unable to obtain due to insurance issues.  Patient denies any fevers, chills, cough, shortness of breath, nausea, or flu-like symptoms.  Although she tells  me that sometimes with attempts at ambulation and episodes of severe pain she does have some nausea.    Past Medical History :   Past Medical History:   Diagnosis Date   . ADD (attention deficit disorder)    . Clavicular fracture        Past Surgical history :   Past Surgical History:   Procedure Laterality Date   . BACK SURGERY  07/28/2018    In [place]   . HUMERUS FRACTURE SURGERY Left 1993   . ------------OTHER-------------  38 yo    humerus ORIF        Family History:   Family History   Problem Relation Name Age of Onset   . Diabetes Paternal Uncle     . Breast Cancer Paternal Aunt     . Breast Cancer Mother  70   . Breast Cancer Paternal Grandmother     . Colon Cancer Neg Hx     . Hypertension Neg Hx     . Cholesterol/Lipid Disorder Neg Hx         Social History:  Social History     Tobacco Use   . Smoking status: Never Smoker   . Smokeless tobacco: Never Used   Substance Use Topics   . Alcohol use: Yes     Comment: socially, couple drinks/week    . Drug use: Yes     Types: Marijuana     Comment: edibles  Alcohol:   Tobacco: denies  Illicit drugs: edibles  Housed    Medications:   Prior to Admission Medications   Prescriptions Last Dose Informant Patient Reported? Taking?   ALPRAZolam (XANAX) 0.25 MG tablet   Yes No   Sig: take 1 tablet by mouth once daily if needed   Amphetamine-Dextroamphetamine (ADDERALL PO)   Yes No   Sig: as needed.   DULoxetine (CYMBALTA) 30 MG CR capsule   No No   Sig: Take 1 capsule (30 mg) by mouth 2 times daily.   HYDROcodone-acetaminophen (NORCO) 5-325 MG tablet   No No   Sig: Take 1 tablet by mouth every 6 hours as needed for Moderate Pain (Pain Score 4-6).   acetaminophen (TYLENOL) 325 MG tablet   No No   Sig: Take 3 tablets (975 mg) by mouth every 8 hours.   clindamycin (CLEOCIN T) 1 % solution   No No   Sig: Apply 1 Application topically 2 times daily. Use a small amount as directed   clindamycin (CLEOCIN) 300 MG capsule   No No   Sig: Take 1 capsule (300 mg) by mouth 3 times  daily.   cyclobenzaprine (FLEXERIL) 5 MG tablet   No No   Sig: Take 1 tablet (5 mg) by mouth 3 times daily as needed for Muscle Spasms.   diazepam (VALIUM) 5 MG tablet   No No   Sig: Take 1 tablet (5 mg) by mouth every 6 hours as needed for Insomnia or Muscle Spasms.   diclofenac (VOLTAREN) 1 % gel   No No   Sig: Apply 1 g topically 4 times daily.   drospirenone-ethinyl estradiol (YAZ) 3-0.02 MG tablet   No No   Sig: Take 1 tablet by mouth daily.   oxyCODONE-acetaminophen (PERCOCET) 5-325 MG tablet   Yes No   Sig: Take 1 tablet by mouth every 6 hours as needed for Severe Pain (Pain Score 7-10).   pregabalin (LYRICA) 75 MG capsule   No No   Sig: Take 1 capsule (75 mg) by mouth 2 times daily.   tiZANidine (ZANAFLEX) 4 MG tablet   No No   Sig: Take 1 tablet (4 mg) by mouth every 6 hours as needed (spasm).   zolpidem (AMBIEN) 5 MG tablet   No No   Sig: Take 1 tablet (5 mg) by mouth nightly as needed for Insomnia.      Facility-Administered Medications: None       Allergies: Sulfa drugs and Tramadol    Review of Systems:  [place]  Constitutional: chills and fever.   HENT: congestion and rhinorrhea.    Respiratory: cough and shortness of breath.    Cardiovascular: chest pain, palpitations and leg swelling.   Gastrointestinal: abdominal distention, abdominal pain, constipation, diarrhea, nausea and vomiting.   Skin: rash and wound.   Neurological: dizziness, seizures, syncope, weakness, light-headedness and headaches.   MSK: low back pain  Psychiatric/Behavioral: confusion and sleep disturbance.     Complete review of 12 systems reviewed and negative unless otherwise noted in the HPI or above. This was done per my custom and practice for systems appropriate to the chief complaint in an emergency department setting and varies depending on the quality of history that the patient is able to provide. History reviewed today as available from records and EPIC chart.      Physical Exam:   07/08/19  1726   BP: 151/97      Pulse: 89   Resp: 18  Temp: 98.4 F (36.9 C)   SpO2: 99%     Nursing note and vitals reviewed. Pt not hypoxic.  Gen: Patient is in NAD, anxious appearing, tearful, tremulous sounding voice, cooperative, GCS 15, A/Ox4  HEENT: NC/AT, PERRL, MMM, no conjunctival injection, b/l sclera anicteric, oropharynx clear, no LAD  Neck: Supple. No midline tenderness. No meningeal signs.  Cardiovascular: no m/r/g, normal heart sounds, no JVD  Pulmonary/Chest: CTAB, no increased WOB, no respiratory distress, no wheezes/rhonchi/rales, no chest wall tenderness.   Abdominal: Soft. NT/ND, +BS, no r/g, no masses   Back: R and L sided paraspinal tenderness to palpations with light palpation of the skin, no spinal deformity noted, no palpable step off, no sign of trauma. Neg straight leg raise  Lower extremities: No TTP along all bones in extremities. No bony deformity, no effusion, no skin breaks  - ROM- full active & passive at hip, knee, ankle.   - SILT L1-S1  - 5 / 5 hip flexion BL;  - 5 / 5 knee extension BL;  - 5 / 5 plantarflexion BL;  - 5 / 5 dorsiflexion BL;  - 5 / 5 great toe extension BL;  - Achilles and patellar reflexes 2/4 bilaterally  - No clonus  - Distal pulses intact  - No edema  Neuro: CN II-XII normal. Speech normal. Neg pronator drift, no finger to nose ataxia. RAM intact and equal bilaterally. Strength and sensation intact b/l UE. No muscle atrophy, Ambulates with steady gait.  Psychiatric: Normal affect. Mood not labile nor depressed.   Skin: No rashes, lesions, or wounds.   Rectal: no saddle numbness         ED Course & Clinical Decision Making:  Khylei Wilms is a 38 year old female with PMHx as below notable for chronic low back pain, anxiety, and prior experience a lumbar surgery in [place] in November of 2019, and then ROI, revision XLIF L4-5 and PSIF L4-5 (Dr Neta Mends) on 01/03/2019 who presents to the emergency department with acute on chronic low back pain.     Vitals: wnl/ stable/ afebrile  Pertinent  Exam Findings:   Back: R and L sided paraspinal tenderness to palpations with light palpation of the skin, no spinal deformity noted, no palpable step off, no sign of trauma. Neg straight leg raise  No neuro deficits as above  Incisional sites intact with no evidence of cellulitis    History and physical most consistent with acute on chronic pain from known spinal stenosis necessitating multiple spinal surgeries. She has had no change in her neurological status, and is ambulating without difficulty, and has no focal neuro deficits as above.  She has no saddle anesthesia, and she has had no bowel or bladder incontinence. Patient has had multiple complaints, but despite triage summary she denies any headache, or color change in her extremities to me. She has no nuchal rigidity, no neuro deficits, and her skin appears normal color. Patient was recently recommended to start Lyrica, but has been unable to due to insurance issues.  She tells me that she attempted taking Percocet today without significant relief and is asking for something stronger.  Told patient that we can give a single tab of Norco for severe pain relief here in the emergency department, but further prescriptions will need to be obtained by pain management or spine surgery.  Disposition pending clinical improvement.    No orders of the defined types were placed in this encounter.    Medications   lidocaine (  ASPERCREME) 4 % patch 1 patch (1 patch Transdermal Patch Applied 07/08/19 1947)   HYDROcodone-acetaminophen (NORCO) 5-325 MG tablet 1 tablet (1 tablet Oral Given 07/08/19 1947)   ondansetron (ZOFRAN ODT) disintegrating tablet 4 mg (4 mg Oral Given 07/08/19 1947)   hydrOXYzine HCL (ATARAX) tablet 25 mg (25 mg Oral Given 07/08/19 1947)         Workup Review as of Jul 07 2050   Durel Salts [facility] Documentation   [facility] Jul 08, 2019   2047 Patient re-evaluated, ambulated back to her exam room from the restroom in no acute distress with a steady gait.  She  is now resting comfortably in bed.  Vital signs remained stable.Patient was reevaluated and states they feel overall improved. The patient was able to tolerate PO and ambulate without difficulty. After conversation with the patient, the decision was made to discharge the patient home with close follow up with their primary medical doctor. Strict return precautions were given verbally and in writing to the patient for any concerning findings. Questions answered, and home medications were reviewed prior to discharge.             I have discussed my thought process and plan for the patient with the attending physician, You, Stevan Born, MD.    Kendrick Ranch, M.D.  [facility] Resident, PGY3         Kendrick Ranch, MD  Resident  07/08/19 2052       You, Stevan Born, MD  07/08/19 [phone]

## 2019-07-08 NOTE — ED Notes (Signed)
Pt informed MD had prescribed her cyclobenzaprine. Pt replied with "it won't do shit. I have some and it won't do shit". Pt stated she will not pick it up.

## 2019-07-08 NOTE — ED Notes (Signed)
Pt w h/o lumbar fusion and lower back pain reports HA and nausea x 2 weeks. Also reports chronic tingling to BLE w decreased perfusion. Denies changes in vision or speech, weakness, vomiting, or additional complaints. MAE, equal strength, +PMS, -drift, symmetrical face, a/ox4, nad noted.

## 2019-07-08 NOTE — ED Notes (Signed)
Pt provided with discharge and follow up instructions.   Verbalizes understanding, no further questions.  All belongings with pt, ambulates with steady gait, a/ox4, NAD noted.   Will call mother for a ride

## 2019-07-10 ENCOUNTER — Ambulatory Visit (HOSPITAL_BASED_OUTPATIENT_CLINIC_OR_DEPARTMENT_OTHER): Payer: Medicaid Other | Admitting: Orthopaedic Surgery of the Spine

## 2019-07-11 ENCOUNTER — Ambulatory Visit (HOSPITAL_BASED_OUTPATIENT_CLINIC_OR_DEPARTMENT_OTHER): Payer: Medicaid Other | Admitting: Neurological Surgery

## 2019-07-11 ENCOUNTER — Ambulatory Visit
Admission: RE | Admit: 2019-07-11 | Discharge: 2019-07-11 | Disposition: A | Discharge status: Home / Self Care | Payer: BC Managed Care – PPO | Attending: Diagnostic Radiology | Admitting: Diagnostic Radiology

## 2019-07-11 ENCOUNTER — Encounter (HOSPITAL_BASED_OUTPATIENT_CLINIC_OR_DEPARTMENT_OTHER): Payer: Self-pay | Admitting: Neurological Surgery

## 2019-07-11 DIAGNOSIS — M5127 Other intervertebral disc displacement, lumbosacral region: Secondary | ICD-10-CM

## 2019-07-11 DIAGNOSIS — M48061 Spinal stenosis, lumbar region without neurogenic claudication: Secondary | ICD-10-CM

## 2019-07-11 DIAGNOSIS — Z9889 Other specified postprocedural states: Secondary | ICD-10-CM

## 2019-07-11 DIAGNOSIS — M4316 Spondylolisthesis, lumbar region: Secondary | ICD-10-CM | POA: Insufficient documentation

## 2019-07-11 NOTE — Progress Notes (Signed)
I have seen and examined Haley Barrett with Haley Delaine, NP and we have reviewed the pertinent clinical data and imaging with the patient. Please see her note for additional historical details.     Briefly, the patient is a 38 year old woman who is presenting after having 2 lumbar spine surgeries.  She had an initial surgery in [place] which was for a non fusion procedure using screws at L4 and L5 with a mechanism that preserved motion that attach is through the screws.  This surgery that she had performed in [place] quickly failed, and then she was treated by Dr. Lelon Huh with an P5-0 fusion using rod screws and a lateral interbody.    She reports that over the last few weeks she has developed severe back pain.  It is been so severe that she has started taking the narcotics which she was given in the postoperative period and she now is very concerned that her symptoms are coming from the same region where she had her last operation.  She does not report any numbness she and she does not report any weakness.    VITAL SIGNS:  BP (!) 147/97 (BP Location: Left arm, BP Patient Position: Sitting, BP cuff size: Regular)   Pulse 91   Temp 98.3 F (36.8 C) (Temporal)   Resp 18   Ht 5\' 10"  (1.778 m)   Wt 79.4 kg (175 lb)   BMI 25.11 kg/m     EXAM:    General/Neurological:  She has well-healed scars.  She has full strength of her tibialis anterior and extensor hallucis longus bilaterally which are 5/5.    NEUROIMAGING    X-Rays obtained today on 07/04/2019 were reviewed with patient and these showed       ASSESSMENT AND PLAN:  [name] is a 38 year old woman with a history of 2 spine surgeries at L4-5, one of [place] with [place] motion sparing device and a more recent the surgery that was a fusion was performed here around 6 months ago.  She has developed new onset back pain which is worse than in the postoperative period and is now concerned and seeking a 2nd opinion.   At this time unfortunately I do not have all the radiographic evidence that I need in order to determine whether not her pain is coming from a pseudoarthrosis or a spinal imbalance.  She would benefit from having a CT of her lumbar spine in order to evaluate whether not she is having hardware failure and to see if there is any lucency around the screws to suggest that she is having a pseudoarthrosis or nonunion.  At this time I would favor obtaining that imaging and having her come back to see me in clinic.  I did mention to her that her symptoms are extreme and that I have trouble correlating her severe pain and her severe concern with her current radiographs which look appropriate at this time. Additionally a CT scan may show a pseudarthrosis or screw lucency - but even if this would be revealed It would be difficult to relate this to her extreme symptoms.     Today I spent a total of 60 minutes of face-to-face time, with greater than 50% of the time spent in counseling and coordination of care. We discussed at great length review of xray and mri, discussion of non-union and the time it takes for her bone to fuse which is too early  currently, and she is still in the recovery phase following her most recent surgery.

## 2019-07-12 ENCOUNTER — Ambulatory Visit (HOSPITAL_BASED_OUTPATIENT_CLINIC_OR_DEPARTMENT_OTHER): Payer: Medicaid Other

## 2019-07-12 ENCOUNTER — Telehealth (INDEPENDENT_AMBULATORY_CARE_PROVIDER_SITE_OTHER): Payer: Medicaid Other | Admitting: Orthopaedic Surgery of the Spine

## 2019-07-13 ENCOUNTER — Ambulatory Visit (HOSPITAL_BASED_OUTPATIENT_CLINIC_OR_DEPARTMENT_OTHER): Payer: BC Managed Care – PPO | Admitting: Neurological Surgery

## 2019-07-18 ENCOUNTER — Ambulatory Visit: Payer: BC Managed Care – PPO | Attending: Neurological Surgery | Admitting: Neurological Surgery

## 2019-07-18 VITALS — BP 140/86 | HR 75 | Temp 98.3°F | Resp 18 | Ht 70.0 in | Wt 179.0 lb

## 2019-07-18 DIAGNOSIS — Z9889 Other specified postprocedural states: Secondary | ICD-10-CM | POA: Insufficient documentation

## 2019-07-18 DIAGNOSIS — M545 Low back pain, unspecified: Secondary | ICD-10-CM

## 2019-07-18 NOTE — Progress Notes (Signed)
I have seen and examined Haley Barrett and I reviewed the pertinent clinical data and imaging with the patient.     Today I spent a total of 40 minutes of face-to-face time, with greater than 50% of the time spent in counseling and coordination of care. We reviewed the imaging together. We discussed surgical decision making and conservative management. We reviewed treatment options.     CT scan report and images show lucency.   Her time course of symptoms are suggestive of non-union at 6 months. Still early for fusion but lucency can contribute to symptoms. Symptoms are out of proportion to radiographs.    I have asked her to discuss the CT results with Dr. Lelon Huh to see if he would have insight to her symptoms.     She was counseled to obtain an EMG and Bone scan at [facility]. I did let her know that a bone scan is invasive and may be too high exposure for her age with minimal benefit now that the CT shows screw lucency.

## 2019-07-21 ENCOUNTER — Encounter (INDEPENDENT_AMBULATORY_CARE_PROVIDER_SITE_OTHER): Payer: Self-pay | Admitting: Orthopaedic Surgery of the Spine

## 2019-07-22 ENCOUNTER — Emergency Department
Admission: EM | Admit: 2019-07-22 | Discharge: 2019-07-22 | Disposition: A | Discharge status: Home / Self Care | Payer: BC Managed Care – PPO | Attending: Emergency Medicine | Admitting: Emergency Medicine

## 2019-07-22 ENCOUNTER — Other Ambulatory Visit: Payer: Self-pay

## 2019-07-22 DIAGNOSIS — M5441 Lumbago with sciatica, right side: Secondary | ICD-10-CM | POA: Insufficient documentation

## 2019-07-22 DIAGNOSIS — R112 Nausea with vomiting, unspecified: Secondary | ICD-10-CM | POA: Insufficient documentation

## 2019-07-22 DIAGNOSIS — R110 Nausea: Secondary | ICD-10-CM

## 2019-07-22 DIAGNOSIS — G8929 Other chronic pain: Secondary | ICD-10-CM | POA: Insufficient documentation

## 2019-07-22 DIAGNOSIS — Z803 Family history of malignant neoplasm of breast: Secondary | ICD-10-CM | POA: Insufficient documentation

## 2019-07-22 DIAGNOSIS — M5442 Lumbago with sciatica, left side: Secondary | ICD-10-CM | POA: Insufficient documentation

## 2019-07-22 DIAGNOSIS — Z79899 Other long term (current) drug therapy: Secondary | ICD-10-CM | POA: Insufficient documentation

## 2019-07-22 MED ORDER — OXYCODONE HCL 5 MG OR TABS
5.0000 mg | ORAL_TABLET | Freq: Once | ORAL | Status: AC
Start: 2019-07-22 — End: 2019-07-22
  Administered 2019-07-22: 5 mg via ORAL
  Filled 2019-07-22: qty 1

## 2019-07-22 MED ORDER — ONDANSETRON 4 MG OR TBDP
4.0000 mg | ORAL_TABLET | Freq: Three times a day (TID) | ORAL | 0 refills | Status: DC | PRN
Start: 2019-07-22 — End: 2019-12-07
  Filled 2019-07-22: qty 4, 2d supply, fill #0

## 2019-07-22 MED ORDER — ACETAMINOPHEN 325 MG PO TABS
975.0000 mg | ORAL_TABLET | Freq: Once | ORAL | Status: AC
Start: 2019-07-22 — End: 2019-07-22
  Administered 2019-07-22 (×2): 975 mg via ORAL
  Filled 2019-07-22: qty 3

## 2019-07-22 MED ORDER — KETOROLAC TROMETHAMINE 30 MG/ML IJ SOLN
15.0000 mg | Freq: Once | INTRAMUSCULAR | Status: AC
Start: 2019-07-22 — End: 2019-07-22
  Administered 2019-07-22 (×2): 15 mg via INTRAVENOUS
  Filled 2019-07-22: qty 1

## 2019-07-22 MED ORDER — PREGABALIN 25 MG OR CAPS
75.0000 mg | ORAL_CAPSULE | Freq: Once | ORAL | Status: AC
Start: 2019-07-22 — End: 2019-07-22
  Administered 2019-07-22 (×2): 75 mg via ORAL
  Filled 2019-07-22: qty 3

## 2019-07-22 MED ORDER — ONDANSETRON HCL 4 MG/2ML IV SOLN
4.0000 mg | Freq: Once | INTRAMUSCULAR | Status: AC
Start: 2019-07-22 — End: 2019-07-22
  Administered 2019-07-22: 4 mg via INTRAVENOUS
  Filled 2019-07-22: qty 2

## 2019-07-22 MED ORDER — OXYCODONE HCL 5 MG OR TABS
5.0000 mg | ORAL_TABLET | Freq: Once | ORAL | Status: AC
Start: 2019-07-22 — End: 2019-07-22
  Administered 2019-07-22 (×2): 5 mg via ORAL
  Filled 2019-07-22: qty 1

## 2019-07-22 MED ORDER — MORPHINE SULFATE 4 MG/ML IJ SOLN
4.0000 mg | Freq: Once | INTRAMUSCULAR | Status: AC
Start: 2019-07-22 — End: 2019-07-22
  Administered 2019-07-22: 4 mg via INTRAVENOUS
  Filled 2019-07-22: qty 1

## 2019-07-22 MED ORDER — METOCLOPRAMIDE HCL 5 MG/ML IJ SOLN
10.0000 mg | Freq: Once | INTRAMUSCULAR | Status: AC
Start: 2019-07-22 — End: 2019-07-22
  Administered 2019-07-22: 10 mg via INTRAVENOUS
  Filled 2019-07-22: qty 2

## 2019-07-22 NOTE — Discharge Instructions (Signed)
You were seen in the emergency department for back pain.  Your evaluated by the physician and at this time do not appear to have an emergent new condition causing her pain.  This pain is likely from your chronic underlying back problems and you should follow up closely with your regular neurosurgeon.  You are given pain medication and will be discharged with a small prescription for antinausea medications.  Please take as prescribed.  Please do not operate a vehicle or participate in potentially dangerous or supervisory activity while taking your opioid pain medications.  Return to the emergency department if you have worsening pain, leg weakness, or incontinence of urine or stool.

## 2019-07-22 NOTE — ED Notes (Signed)
Pt loudly crying, reports pain persists. Oral meds did not work. MD will be notified to reassess

## 2019-07-22 NOTE — ED Notes (Addendum)
Pt cleared for DC  Pt verbalizes readiness to go home  ACI, Rx, Return precautions, and Follow up Instructions explained and well understood  All questions answered  DC'd in good condition    Called for ride. Reports not wanting to get Rx from [facility] despite offering someone to take pt to DC pharm to pick up. Pt reports she has some zofran at home

## 2019-07-22 NOTE — ED Notes (Signed)
Pt sitting up in gurney w/ good strength. Moaning in pain. Initially requesting IV meds but reports can tolerate PO and PO meds given

## 2019-07-22 NOTE — ED Notes (Signed)
POC and appropriate pain management discussed. RN ambulates pt to BR, stand by assist. Pt able to intermittently work w/ good strength however at times lean against wall/furniture to brace herself. Walker could be of assistance.

## 2019-07-22 NOTE — ED Notes (Signed)
Steady gait to waiting room. No assist needed

## 2019-07-22 NOTE — ED Notes (Signed)
Bed: 12  Expected date:   Expected time:   Means of arrival:   Comments:  67F back pain

## 2019-07-22 NOTE — ED Provider Notes (Signed)
History  Chief Complaint   Patient presents with   . Back Pain     BIBA from home. Hx of spinal fusion 12/2018, presents with back pain and vomiting since last night. VSS, afebrile. Given fentanyl and IVF en route. BG 34.      38 year old female with a history of lumbar back surgery in April 2020 who presents with lumbar back pain.  She reports that she has continued to have worsening diffuse lumbar pain and burning in her bilateral legs since the surgery but it has been worsening since early October.  She has been extensively worked up by her Careers adviser and sought 2nd opinion several hospitals.  She currently is getting allergy testing for metal allergies as well as scheduling an EMG and other scans at [facility].  Yesterday she feels she went for a longer walk than normal and has been having significant pain in the evening and throughout the night so she presents by ambulance for evaluation.  The pain is the same pain that she normally deals with but feels stronger this morning and states that she had nausea and vomiting when she tried to take her morning meds so she came to the hospital for evaluation.  She denies new numbness in her legs.  She denies focal weakness or paralysis.  She denies any incontinence of urine or stool.  She has been able to urinate normally without issue.  She denies any IV drug use and reports she takes naproxen daily as well as 1-3 Norco daily for her pain.  She denies pregnancy.          Past Medical History:   Diagnosis Date   . ADD (attention deficit disorder)    . Clavicular fracture        Past Surgical History:   Procedure Laterality Date   . BACK SURGERY  07/28/2018    In [place]   . HUMERUS FRACTURE SURGERY Left 1993   . ------------OTHER-------------  38 yo    humerus ORIF        Family History   Problem Relation Name Age of Onset   . Diabetes Paternal Uncle     . Breast Cancer Paternal Aunt     . Breast Cancer Mother  21   . Breast Cancer Paternal Grandmother     . Colon  Cancer Neg Hx     . Hypertension Neg Hx     . Cholesterol/Lipid Disorder Neg Hx         Social History     Tobacco Use   . Smoking status: Never Smoker   . Smokeless tobacco: Never Used   Substance Use Topics   . Alcohol use: Yes     Comment: socially, couple drinks/week    . Drug use: Yes     Types: Marijuana     Comment: edibles       Home Medication List  Prior to Admission Medications   Prescriptions Last Dose Informant Patient Reported? Taking?   ALPRAZolam (XANAX) 0.25 MG tablet   Yes No   Sig: take 1 tablet by mouth once daily if needed   Amphetamine-Dextroamphetamine (ADDERALL PO)   Yes No   Sig: as needed.   DULoxetine (CYMBALTA) 30 MG CR capsule   No No   Sig: Take 1 capsule (30 mg) by mouth 2 times daily.   HYDROcodone-acetaminophen (NORCO) 5-325 MG tablet   No No   Sig: Take 1 tablet by mouth every 6 hours as needed for Moderate  Pain (Pain Score 4-6).   acetaminophen (TYLENOL) 325 MG tablet   No No   Sig: Take 3 tablets (975 mg) by mouth every 8 hours.   clindamycin (CLEOCIN T) 1 % solution   No No   Sig: Apply 1 Application topically 2 times daily. Use a small amount as directed   clindamycin (CLEOCIN) 300 MG capsule   No No   Sig: Take 1 capsule (300 mg) by mouth 3 times daily.   cyclobenzaprine (FLEXERIL) 5 MG tablet   No No   Sig: Take 1 tablet (5 mg) by mouth 3 times daily as needed for Muscle Spasms.   diazepam (VALIUM) 5 MG tablet   No No   Sig: Take 1 tablet (5 mg) by mouth every 6 hours as needed for Insomnia or Muscle Spasms.   diclofenac (VOLTAREN) 1 % gel   No No   Sig: Apply 1 g topically 4 times daily.   drospirenone-ethinyl estradiol (YAZ) 3-0.02 MG tablet   No No   Sig: Take 1 tablet by mouth daily.   oxyCODONE-acetaminophen (PERCOCET) 5-325 MG tablet   Yes No   Sig: Take 1 tablet by mouth every 6 hours as needed for Severe Pain (Pain Score 7-10).   pregabalin (LYRICA) 75 MG capsule   No No   Sig: Take 1 capsule (75 mg) by mouth 2 times daily.   tiZANidine (ZANAFLEX) 4 MG tablet   No No      Sig: Take 1 tablet (4 mg) by mouth every 6 hours as needed (spasm).   zolpidem (AMBIEN) 5 MG tablet   No No   Sig: Take 1 tablet (5 mg) by mouth nightly as needed for Insomnia.      Facility-Administered Medications: None       Review of Systems   Constitutional: Negative for chills and fever.   HENT: Negative for congestion, sore throat, trouble swallowing and voice change.    Eyes: Negative for visual disturbance.   Respiratory: Negative for cough and shortness of breath.    Cardiovascular: Negative for chest pain and palpitations.   Gastrointestinal: Negative for abdominal pain, blood in stool, diarrhea, nausea and vomiting.   Genitourinary: Negative for dysuria and hematuria.   Musculoskeletal: Positive for back pain.   Skin: Negative for rash.   Neurological: Negative for syncope, weakness, numbness and headaches.   Psychiatric/Behavioral: Negative for confusion and suicidal ideas.       Physical Exam  BP 142/96   Pulse 76   Temp 98 F (36.7 C)   Resp 18   Ht 5\' 10"  (1.778 m)   SpO2 100%   BMI 25.69 kg/m     Physical Exam  Constitutional:       General: She is not in acute distress.     Appearance: She is not ill-appearing.      Comments: Uncomfortable appearing   HENT:      Head: Normocephalic and atraumatic.      Right Ear: External ear normal.      Left Ear: External ear normal.      Nose: Nose normal.      Mouth/Throat:      Mouth: Mucous membranes are moist.      Pharynx: Oropharynx is clear.   Eyes:      Extraocular Movements: Extraocular movements intact.      Conjunctiva/sclera: Conjunctivae normal.      Pupils: Pupils are equal, round, and reactive to light.   Neck:  Musculoskeletal: Neck supple. No muscular tenderness.   Cardiovascular:      Rate and Rhythm: Normal rate and regular rhythm.      Pulses: Normal pulses.      Heart sounds: No murmur. No friction rub. No gallop.    Pulmonary:      Effort: Pulmonary effort is normal. No respiratory distress.      Breath sounds: No stridor. No  wheezing, rhonchi or rales.   Abdominal:      General: There is no distension.      Palpations: Abdomen is soft.      Tenderness: There is no abdominal tenderness. There is no guarding or rebound.   Musculoskeletal:      Right lower leg: No edema.      Left lower leg: No edema.   Skin:     General: Skin is warm.      Capillary Refill: Capillary refill takes less than 2 seconds.   Neurological:      General: No focal deficit present.      Mental Status: She is alert and oriented to person, place, and time.      GCS: GCS eye subscore is 4. GCS verbal subscore is 5. GCS motor subscore is 6.      Motor: Motor function is intact. No weakness, atrophy or abnormal muscle tone.      Gait: Gait is intact.      Deep Tendon Reflexes:      Reflex Scores:       Patellar reflexes are 2+ on the right side and 2+ on the left side.     Comments: No clonus in bilateral ankles.  Sensation intact throughout the upper and lower extremities.  No saddle anesthesia   Psychiatric:         Mood and Affect: Mood is anxious.         Behavior: Behavior normal.         POCT Results       Workup Review  Workup Review as of Jul 22 1507   [facility] Documentation   GUY Jul 22, 2019   4034 Patient reports return of her nausea and ongoing back pain.  Patient understands that she has chronic back pain and has already arrange follow-up as soon as this Wednesday with her neuro surgery doctors.  She feels that this is mostly from over exertion yesterday and is okay being discharged home with follow-up.  She has no lower extremity new neurologic findings, sensation deficits, or saddle anesthesia concerning for cord compression.  I will provide 1 dose of IV pain medication now and discharge the patient with several doses of ondansetron for nausea.  She was provided with return precautions for worsening neurologic symptoms and advised to follow up closely with her regular providers.      0823 SpO2: 100 %   0823 Respirations: 18   0823 Heart  Rate: 76   0823 Temperature: 98 F (36.7 C)   0823 Blood pressure (BP): 142/96       Impression/Medical Decision Making:  38 year old female with chronic lumbar back pain with prior surgeries who presents with the same.  This was exacerbated by walking too much yesterday.  No neurologic deficits or red flags for cord compression or cauda equina.  Pain was improved with oral and 1 dose of IV pain medications here in the emergency department.  She is also reporting some nausea from her pain and will be given several tablets of ondansetron  which has worked for her in the past.  Patient has had no recent spinal injections and does not have a history of IV drug use.  There is no indication of ongoing infectious cause such as epidural abscess or osteomyelitis at this time.  There is no report or evidence of trauma.  Patient will be discharged to her already scheduled neurosurgery follow-up with precautions for sedating medications and several tablets of ondansetron.  Return precautions provided.           Lamount Crankerntiveros, Brittlyn Cloe Tomas, MD  07/22/19 [phone]

## 2019-07-24 ENCOUNTER — Encounter (INDEPENDENT_AMBULATORY_CARE_PROVIDER_SITE_OTHER): Payer: Self-pay

## 2019-07-24 ENCOUNTER — Telehealth (HOSPITAL_BASED_OUTPATIENT_CLINIC_OR_DEPARTMENT_OTHER): Payer: Self-pay | Admitting: Neurological Surgery

## 2019-07-24 NOTE — Telephone Encounter (Signed)
Patient is scheduled for in-clinic appointment.    Appointment scheduled for 11/24    Do you have or have you had in the last 24 hours:            Fever:  no           New cough (not chronic) : no           Shortness of breath: no  *Refer patient to PCP if YES for any flu like symptoms           2. Have you knowingly been exposed to anyone having any, some or all of the symptoms listed above?  no     3. Have you traveled  outside of the [place] in the last 14 days? .  no     If Yes to any of these, schedule and or reschedule existing appointment 14 + days out     4.    Are you experiencing any new urgent Neurological symptoms? no  If Yes to # 4, provide symptoms below using .sccsymptoms smart phrase and route to site specific triage.    If no to # 4, document encounter and close.  Do not route.    Did you inform patient of no visitor policy yes    Will the patient require a visitor? yes    If Yes: document visitor's full name in appointment notes.

## 2019-07-24 NOTE — Telephone Encounter (Signed)
From: [name]  To: [name], MD  Sent: 59/93/5701 5:09 PM PST  Subject: 20-Other    This is just one small clip from one of the studies. If there are any other tests I could possibly get done in the next few days to help show you this is an issue please let me know. I know it is abnormal but it is clearly affecting my body quite negatively.   Thank you for considering this info before my appointment:   "Retrolisthesis has been shown to cause narrowing of the disc space when the annulus fibrosus bulges posteriorly [16]. Concurrently, there can be a relative translation of the superior articular process of the vertebra caudal to the mobile segment in the direction of the intervertebral foramen. This can cause a lateral stenosis that can produce painful radicular symptoms [17]. Investigations of white [5] and African American [6] women both concluded that retrolisthesis was associated with a higher likelihood of low back pain. Although once believed to be a benign finding, it is becoming more apparent that retrolisthesis can be a source of morbidity for patients. The postoperative data shows that with time, the effects of retrolisthesis may be clearer. Although there is no appreciable difference at the first follow-up of 3 months, at 1 year patients with retrolisthesis exhibited greater bodily pain that continued through the 2-year and 4-year follow-up points. It is possible that   after the removal of the offending disc and recovery from the actual lumbar discectomy procedure, pain caused by retrolisthesis was no longer overshadowed."

## 2019-07-24 NOTE — Telephone Encounter (Signed)
From: [name]  To: [name], MD  Sent: 32/95/1884 5:01 PM PST  Subject: 20-Other    Doctor Z and team,     Will you please consider these research papers I have referenced below? I am doing quite poorly and believe that especially the first one is relevant to me. I can feel that this is an alignment issue in my body and my symptoms are only seem to be increasing. I believe there is some evidence of my body being in misalignment from Doctor Osorio's x-rays and what I feel seems to match exactly what he relayed in the x-ray.     Research on Retrolisthesis:  Retrolisthesis and lumbar disc herniation: a postoperative assessment of patient function  GlobalUpset.es    Spine J. Chief Strategy Officer manuscript; available in [facility] 2013 Aug 5.  Published in final edited form as: Spine J. 2013 Apr; 13(4): 166063.  Published online 2012 Nov 30. doi: [email or web address]/[email or web address].2012.10.017  PMCID: [id number]  NIHMSID: [id number]  PMID: [id number]  Pattern of Degenerative Lumbar Retrolisthesis in [facility]  [email or web address]  [email or web address]  June 2015 [place] of Surgery 21(1):2-11  DOI: 10.33762/[email or web address].[id number]      Retrolisthesis as a Compensatory Mechanism in Degenerative Lumbar Spine  GlobalU[employer]et.es  Marya Amsler, M.D. and Neldon Mc, M.D.  [employer] Neurosurg Soc. 2015 Mar; 57(3): 737106.  Published online 2015 Mar 20. doi: [email or web address]  PMCID: [id number] PMID: [id number]

## 2019-07-25 ENCOUNTER — Encounter (INDEPENDENT_AMBULATORY_CARE_PROVIDER_SITE_OTHER): Payer: Self-pay | Admitting: Orthopaedic Surgery of the Spine

## 2019-07-25 DIAGNOSIS — Z981 Arthrodesis status: Secondary | ICD-10-CM

## 2019-07-25 NOTE — Telephone Encounter (Signed)
She can schedule a video visit with Dr. Earnest Conroy if she wants to discuss. My chart messages are not appropriate for these issues.

## 2019-07-26 ENCOUNTER — Inpatient Hospital Stay (INDEPENDENT_AMBULATORY_CARE_PROVIDER_SITE_OTHER)
Admit: 2019-07-26 | Discharge: 2019-07-26 | Disposition: A | Discharge status: Home Health | Payer: BC Managed Care – PPO

## 2019-07-26 ENCOUNTER — Encounter (INDEPENDENT_AMBULATORY_CARE_PROVIDER_SITE_OTHER): Payer: Self-pay | Admitting: Orthopaedic Surgery of the Spine

## 2019-07-26 ENCOUNTER — Ambulatory Visit (INDEPENDENT_AMBULATORY_CARE_PROVIDER_SITE_OTHER): Payer: BC Managed Care – PPO | Admitting: Orthopaedic Surgery of the Spine

## 2019-07-26 VITALS — BP 125/90 | HR 79 | Temp 98.0°F | Ht 70.0 in | Wt 180.0 lb

## 2019-07-26 DIAGNOSIS — M4316 Spondylolisthesis, lumbar region: Secondary | ICD-10-CM

## 2019-07-26 DIAGNOSIS — Z981 Arthrodesis status: Secondary | ICD-10-CM

## 2019-07-26 DIAGNOSIS — M545 Low back pain, unspecified: Secondary | ICD-10-CM

## 2019-07-26 DIAGNOSIS — G8929 Other chronic pain: Secondary | ICD-10-CM | POA: Insufficient documentation

## 2019-07-31 ENCOUNTER — Other Ambulatory Visit: Payer: Self-pay

## 2019-08-01 ENCOUNTER — Encounter (HOSPITAL_BASED_OUTPATIENT_CLINIC_OR_DEPARTMENT_OTHER): Payer: Self-pay | Admitting: Family

## 2019-08-01 ENCOUNTER — Encounter (HOSPITAL_BASED_OUTPATIENT_CLINIC_OR_DEPARTMENT_OTHER): Payer: Self-pay | Admitting: Neurological Surgery

## 2019-08-01 ENCOUNTER — Ambulatory Visit: Payer: BC Managed Care – PPO | Attending: Family | Admitting: Neurological Surgery

## 2019-08-01 VITALS — BP 133/89 | HR 82 | Temp 97.8°F | Resp 18 | Ht 70.0 in | Wt 179.9 lb

## 2019-08-01 DIAGNOSIS — Z981 Arthrodesis status: Secondary | ICD-10-CM | POA: Insufficient documentation

## 2019-08-01 NOTE — Progress Notes (Signed)
Neurosurgery Follow up Evaluation     Attending MD: Dr. Memory Dance      History of Present Illness:     Haley Barrett is a 38 year old female with a past history significant for Low Back Pain s/p ROI, revision XLIF L4-5 and PSIF L4-5 on 01/03/2019 with Dr Zlomislic (Orthopedics). Patient continues to endorse upper thoracic parascapular back pain with right sided tricep pain and back "pulling and burning pain".  Some weakness L>R legs.  Numbness/tingling in bilateral pinkies.  patient states that her current pain in clinic today is 7/10.  She states that her pain is worse in her back than in her legs.  Her leg pain is L>R.  She endorses a burning sensation in her thoracic spine and feels as if there is a hinging sensation in her lumbar spine.  Patient is currently taking oxycodone 5 mg q.6 hours.  She is no longer taking Cymbalta or Lyrica, nor is she taking Flexeril. On her last visit with [place] on 07/18/19 we sent her back to discuss the CT results with Dr. Zlomislic to see if he would have insight to her symptoms.  She states that she did go to see Dr. Christa See, however it appears that she was unclear of the plan when she left his office.  Patient otherwise denies fall, discoordination, gait instability, saddle anesthesia, bowel or bladder incontinence.      Allergies:  Allergies   Allergen Reactions   . Sulfa Drugs Nausea Only and Anxiety   . Tramadol Nausea and Vomiting       Medications:  Current Outpatient Medications   Medication Sig   . acetaminophen (TYLENOL) 325 MG tablet Take 3 tablets (975 mg) by mouth every 8 hours.   . ALPRAZolam (XANAX) 0.25 MG tablet take 1 tablet by mouth once daily if needed   . Amphetamine-Dextroamphetamine (ADDERALL PO) as needed.   . clindamycin (CLEOCIN T) 1 % solution Apply 1 Application topically 2 times daily. Use a small amount as directed   . clindamycin (CLEOCIN) 300 MG capsule Take 1 capsule (300 mg) by mouth 3 times daily.   . cyclobenzaprine (FLEXERIL) 5 MG tablet Take  1 tablet (5 mg) by mouth 3 times daily as needed for Muscle Spasms.   . diazepam (VALIUM) 5 MG tablet Take 1 tablet (5 mg) by mouth every 6 hours as needed for Insomnia or Muscle Spasms.   . diclofenac (VOLTAREN) 1 % gel Apply 1 g topically 4 times daily.   . drospirenone-ethinyl estradiol (YAZ) 3-0.02 MG tablet Take 1 tablet by mouth daily.   . DULoxetine (CYMBALTA) 30 MG CR capsule Take 1 capsule (30 mg) by mouth 2 times daily.   Marland Kitchen HYDROcodone-acetaminophen (NORCO) 5-325 MG tablet Take 1 tablet by mouth every 6 hours as needed for Moderate Pain (Pain Score 4-6).   Marland Kitchen ondansetron (ZOFRAN ODT) 4 MG disintegrating tablet Dissolve 1 tablet (4 mg) by mouth every 8 hours as needed for Nausea/Vomiting.   Marland Kitchen oxyCODONE-acetaminophen (PERCOCET) 5-325 MG tablet Take 1 tablet by mouth every 6 hours as needed for Severe Pain (Pain Score 7-10).   . pregabalin (LYRICA) 75 MG capsule Take 1 capsule (75 mg) by mouth 2 times daily.   Marland Kitchen tiZANidine (ZANAFLEX) 4 MG tablet Take 1 tablet (4 mg) by mouth every 6 hours as needed (spasm).   Marland Kitchen zolpidem (AMBIEN) 5 MG tablet Take 1 tablet (5 mg) by mouth nightly as needed for Insomnia.     No current facility-administered medications  for this visit.        Review of Systems:  Per HPI, All other review of systems were discussed and reported negative    Physical Exam:  BP 133/89 (BP Location: Left arm, BP Patient Position: Sitting, BP cuff size: Regular)   Pulse 82   Temp 97.8 F (36.6 C) (Temporal)   Resp 18   Ht 5\' 10"  (1.778 m)   Wt 81.6 kg (179 lb 14.3 oz)   BMI 25.81 kg/m       General: The patient is here with her father at her side.  She appears slightly anxious with some difficulties concentrating.  HEENT: Head is normocephalic, atraumatic, conjunctivae without icterus, oropharynx is patent without erythema or edema.   Neck: Supple, non tender c-spine, trachea is midline.   Extremities: No clubbing, cyanosis, or edema  Neurological examination:   Mental status: The patient is  alert & oriented x3.   Intact recent and remote memory. Appropriate mood and affect.   Normal attention span and concentration.   Speech and language intact, no hoarseness or stridor, conversant without signs of aphasia or neglect. Good fund of knowledge.     Cranial Nerves: Pupils are equal, round and reactive to light and accommodation bilaterally, extraocular movements intact, no nystagmus noted, visual field intact to confrontation.  Face is symmetric, no ptosis noted  Hearing intact to finger rub, tongue and uvula is midline  Tone normal, no tremor. No muscle atrophy noted    MOTOR STRENGTH                  Upper extremeties: RUE LUE Lower extremities: RLE LLE   Deltoid 5/5 5/5 Iliopsoas 5/5 5/5   Biceps 5/5 5/5 Quadriceps 5/5 5/5   Triceps 5/5 5/5 Biceps Femoris 5/5 5/5   Wrist flexion  5/5 5/5      Wrist extension 5/5 5/5            Hand grip 5/5 5/5 Tibialis anterior 5/5 5/5   PIP extension 5/5 5/5 Gastrocnemius 5/5 5/5   Intrinsics 5/5 5/5 EHL 5/5 5/5       DEEP TENDON REFLEXES                  Upper extremities: RUE LUE Lower extremities: RLE LLE   Biceps 2+ 2+ Patellar 2+ 2+   Triceps 2+ 2+ Ankle 2+ 2+   Brachioradialis 2+ 2+              Sensory intact throughout   Intact finger-to-nose   Romberg's and Pronator drift negative   Gait is normal  Single leg stance-  able on R/L   Able to perform tandem walk  Able to perform heel/toe walk     Imaging:  07/26/19 Lumbar Spine X-Tay  IMPRESSION:    Redemonstration of posterior instrumented fixation and interbody graft at L4-L5 without interval hardware complication/failure. Similar 7 mm anterolisthesis of L4 on L5.    Recommendations:  38 year old female with history of Low Back Pain s/p ROI, revision XLIF L4-5 and PSIF L4-5 on 0/03/1218 with Dr Zlomislic (Orthopedics). Patient continues to endorse upper thoracic parascapular back pain with right sided tricep pain and back "pulling and burning pain". Bilateral foot discoloration and pain.  Some weakness L>R  legs.  Numbness/tingling in bilateral pinkies.      - All imaging reviewed with patient by Dr. Annie Main. CT scan report and images show lucency. Her time course of symptoms are suggestive of non-union at  6 months. Still early for fusion but lucency can contribute to symptoms. Symptoms are out of proportion to radiographs.   - At this time we recommend that she return to Dr.Zlomislic to discuss her symptoms and determine whether not he wants to proceed with another surgery.  - In the meantime we recommend the patient be evaluated by a neuro psychologist to examine the psychological components of her pain symptoms.  We have placed a referral for her to see Dr. Molli BarrowsMaya D'eon who is a pain psychologist who works with many of our neurosurgery patient prior to spinal cord stimulator placement.  - RTC PRN.    -Patient knows to call for any issues or any change in neurologic status before next visit   - All questions and concerns addressed       The supervising physician responsible for the care of this patient is Dr. [name]    Juanetta BeetsMaureen Deloros Beretta, FNP- [facility]  Department of Neurosurgery   [facility]   Email: [email or web address]

## 2019-08-04 ENCOUNTER — Other Ambulatory Visit: Payer: Self-pay

## 2019-08-05 ENCOUNTER — Encounter (INDEPENDENT_AMBULATORY_CARE_PROVIDER_SITE_OTHER): Payer: Self-pay | Admitting: Orthopaedic Surgery of the Spine

## 2019-08-07 NOTE — Telephone Encounter (Signed)
From: [name]  To: [name], MD  Sent: 93/79/0240 6:29 PM PST  Subject: 2-Procedural Question    I have an appointment in about a week or so (if there is one available earlier that would be great), however, I was wondering if I could get the notes and surgical recommendation from my last appointment with Doctor Z before my next appointment? We had discussed the issue with the "hinging" action in my back between the surgical site and the instability beginning at a higher level than the surgical level. I am continuing to do poorly, have to be very careful not to do too much even with basic chores or I end up in more pain, am sleeping on the floor, use ice packs almost constantly and think I am taking almost as much medication as I did after surgery at this point to manage pain. It's not sustainable and seems only to be getting worse as time goes on. I'd like to move forward with the surgical option as soon as possible. I would like to be able to present Doctor Z's notes to another surgeon as a second opinion since I can get in earlier with that surgeon before I see Doctor Z again.    Please let me know if this would be possible or alternatively if I could get in for an earlier appointment with Doctor Z to discuss surgical options if possible.

## 2019-08-07 NOTE — Progress Notes (Signed)
ORTHOPAEDIC SPINE SURGERY     Visit Type: Office Visit    Reason For Visit: Low Back Pain    Surgery Date: 01/03/2019    Procedure: ROI, revision XLIF L4-5 and PSIF L4-5     History Of Present Illness: 38 year old female returns to spine clinic now approximately 6.5 months s/p revision lumbar fusion L4-5 as noted above. She states taht she continues to have significant mid thoracic and thoracolumbar back pain. She states that at times she is unable to even get out of bed and has difficulty engaging in any activities. She does have some periods where she is able to do more, albeit still limited, but that any increase in her activity results in exacerbation of her pain. She has been trying to walk more, but remains quite limited in terms of her distance. She relates trying to take her dog for a one mile walk following which she was in severe pain for over a week. She describes intermittent feelings of pain and weakness into bilateral lower extremities. She has no focal weakness, but describes feelings of her legs being unsteady at times.  She has undergone trial of PT, but that had resulted in exacerbation of her symptoms. Most recently, she had trial of si joint injections which did not provide her any relief of symptoms. She continues to take pain medications for pain control The pain is very limiting and requires her to moderate her activities. She states that she is essentially bed ridden at times due to her pain. To review briefly, she underwent wide lumbar laminectomy and instrumentation L4-5 in [place] about one year ago following which she continued to have severe pain, and ultimately proceeded with revision lumbar stabilization with fusion L4-5 as noted above. No issues with incisions. Patient rates her pain as a 5. Patient reports no changes in bowel and bladder function. Patient returns for routine follow up.    Allergies:   Allergies   Allergen Reactions   . Sulfa Drugs Nausea Only and Anxiety   . Tramadol  Nausea and Vomiting       Medications:   Current Outpatient Medications   Medication Sig   . acetaminophen (TYLENOL) 325 MG tablet Take 3 tablets (975 mg) by mouth every 8 hours.   . ALPRAZolam (XANAX) 0.25 MG tablet take 1 tablet by mouth once daily if needed   . Amphetamine-Dextroamphetamine (ADDERALL PO) as needed.   . clindamycin (CLEOCIN T) 1 % solution Apply 1 Application topically 2 times daily. Use a small amount as directed   . clindamycin (CLEOCIN) 300 MG capsule Take 1 capsule (300 mg) by mouth 3 times daily.   . cyclobenzaprine (FLEXERIL) 5 MG tablet Take 1 tablet (5 mg) by mouth 3 times daily as needed for Muscle Spasms.   . diazepam (VALIUM) 5 MG tablet Take 1 tablet (5 mg) by mouth every 6 hours as needed for Insomnia or Muscle Spasms.   . diclofenac (VOLTAREN) 1 % gel Apply 1 g topically 4 times daily.   . drospirenone-ethinyl estradiol (YAZ) 3-0.02 MG tablet Take 1 tablet by mouth daily.   . DULoxetine (CYMBALTA) 30 MG CR capsule Take 1 capsule (30 mg) by mouth 2 times daily.   Marland Kitchen HYDROcodone-acetaminophen (NORCO) 5-325 MG tablet Take 1 tablet by mouth every 6 hours as needed for Moderate Pain (Pain Score 4-6).   Marland Kitchen ondansetron (ZOFRAN ODT) 4 MG disintegrating tablet Dissolve 1 tablet (4 mg) by mouth every 8 hours as needed for Nausea/Vomiting.   Marland Kitchen  oxyCODONE-acetaminophen (PERCOCET) 5-325 MG tablet Take 1 tablet by mouth every 6 hours as needed for Severe Pain (Pain Score 7-10).   . pregabalin (LYRICA) 75 MG capsule Take 1 capsule (75 mg) by mouth 2 times daily.   Marland Kitchen tiZANidine (ZANAFLEX) 4 MG tablet Take 1 tablet (4 mg) by mouth every 6 hours as needed (spasm).   Marland Kitchen zolpidem (AMBIEN) 5 MG tablet Take 1 tablet (5 mg) by mouth nightly as needed for Insomnia.     No current facility-administered medications for this visit.          Review of Systems:  CONSTITUTIONAL: No unexpected weight loss, fevers, chills, or anorexia. SKIN: Noncontributory. GENITOURINARY: Symptoms as noted above in HPI. NEUROLOGIC:  Symptoms as noted above in HPI. MUSCULOSKELETAL: Symptoms as noted above in HPI.    Physical Examination:  BP 125/90 (BP Location: Left arm, BP Patient Position: Sitting, BP cuff size: Regular)   Pulse 79   Temp 98 F (36.7 C) (Oral)   Ht 5\' 10"  (1.778 m)   Wt 81.6 kg (180 lb)   BMI 25.83 kg/m   CONSTITUTIONAL: Well appearing and well groomed. PSYCHOLOGICAL: Alert and oriented and appropriate to situation. INTEGUMENT: Intact. VASCULAR: Radial and pedal pulses are intact and symmetric. EXTREMITIES: Bilateral upper and lower extremities have good range of motion and no significant deformities. GAIT: Brisk with good coordination.     CERVICAL SPINE: Nontender to palpation. Spurling's test negative. Sensation intact of the upper extremities.    RANGE OF MOTION (in degrees)       RT LT  Lateral motion   80  80  Lateral bend   45  45  Flexion 40 degrees. Extension 35 degrees. Motion in all planes is nonpainful.    MOTOR    RT LT  Trapezius   5/5  5/5  Deltoid    5/5  5/5  Bicep    5/5  5/5  Tricep    5/5  5/5  Wrist flexor   5/5  5/5  Wrist extensor  5/5  5/5  Intrinsics   5/5  5/5  Grip    5/5  5/5    DEEP TENDON REFLEXES       RT  LT  Bicep    2+  2+  Brachioradialis  2+  2+  Tricep    2+  2+    THORACIC SPINE  Nontender to percussion. Alignment - normal, no paravertebral muscle fullness. Sensation intact. Beevor's negative. Abdominal reflexes are absent and symmetric.     LUMBAR SPINE  Left lumbosacral, paraspinal and SI joint tenderness to palpation. Heel to toe walk with difficulty due to pain. Sensation intact of the lower extremities. Trendelenburg sign negative. Skin intact.    RANGE OF MOTION        RT  LT  lateral bend, normal at 25 degrees 25  25  rotation, normal at 30 degrees 30  30  Flexion - normal at 40 degrees. Extension - normal at 10 degrees.    MOTOR STRENGTH       RT  LT  Iliopsoas   5/5  5/5  Quadricep   5/5  5/5  Anterior tibialis   5/5  5/5  Extensor hallucis longus 5/5   5/5  Gastrocsoleus  5/5  5/5    DEEP TENDON REFLEXES       RT  LT  Patellar   2+  2+  Achilles   2+  2+    PATHOLOGIC REFLEXES  RT LT  Clonus    Neg Neg  Babinski   Neg Neg  Hoffmans   Neg Neg    STRAIGHT LEG RAISING       RT  LT  Sitting @ 90 degrees   Neg Neg    IMAGING STUDIES:   X-ray Lumbosacral Spine 2 Or 3 Views    Result Date: 06/14/2019  IMPRESSION: Posterior decompression and combined anterior and posterior instrumented fixation at L4-L5 without evidence of hardware complication. 7 mm anterolisthesis of L4 over L5 without change between the lateral flexion-extension views.    X-ray Lumbosacral Spine 2 Or 3 Views    Result Date: 06/14/2019  IMPRESSION: No interval change. Status post L4-L5 pedicle screws instrumentation, anterior interbody grafting and posterior bone grafting after decompression without change in alignment or hardware complication.    CT lumbar spine shows:  Posterior decompression and instrumented fixation at L4-L5 and placement of an interbody graft at L4-L5.    Minimal lucency surrounding the L4 transpedicular screws may represent micro motion. The interbody graft is not fully incorporated.    4 mm anterolisthesis of L4 on L5.    Persistent fluid collection in the surgical bed, similar to the MRI of 04/18/2019 may represent a seroma. Sampling of the fluid collection could be obtained if there is a clinical concern about an infection.        OTHER MEDICAL RECORDS/IMAGING STUDIES REVIEWED: None    IMPRESSION:   Encounter Diagnoses   Name Primary?   . S/P lumbar fusion Yes   . Chronic bilateral low back pain without sciatica         PLAN/DISCUSSION: Reviewed clinical history as well as exam results and imaging findings with patient in detail. Imaging shows intact instrumentation with anatomic alignment and no evidence of stenosis, though there is some evidence of minimal lucency around L4 pedicle screws. There is no evidence of motion on dynamic radiographs, though a component of  micromotion cannot be ruled out given small lucency around L4 screws, although given there was necessity to reinstrument screws that had been previously placed it is unclear whether CT findings are representatative of this. In addition graft does appear to be incorporating without evidence of subsidence or lucency. She does continue to have symptoms of severe low back pain, which at times is near completely incapacitating, which does seem out of proportion to her imaging findings, and continues to have intermittent radicular type complaints, though no evidence of stenosis on imaging. We discussed options at length. We have discussed possibility of need to extend fusion to L3 given wide resection of L4 lamina that was carried out at her index surgery in [place], which would likely be necessary for any revision surgery, though both she and we would not be in favor of revision surgery at this time, and would defer if at all possible.. She is understandably frustrated by her ongoing pain and we discussed additional options. Placed referral for PT to see if she is able to slowly progress on a path of core strengthening and improvement to advance her activity in addition to facet injections which may try for diagnostic relief of symptoms. Continue pain medications PRN. She states that she will be moving out of state and likely will not be able to follow up any further. Will continue observation and follow up in 6 weeks. Continue to hold off any active PT for now, and she may continue to moderate her activities based on her symptoms.  All questions were  answered and Bristyn Kulesza understood and was satisfied with this plan.     FOLLOWUP: Return to clinic: six weeks The patient is encouraged to call us with any questions or problems in the interim. Our contact numbers were given to the patient.

## 2019-08-07 NOTE — Progress Notes (Signed)
Attending Note:    Subjective:  I reviewed the history.  Patient interviewed and examined.  History of present illness (HPI):  Back Pain (Patient had esi injection with  no relief.  She feels like she is progressively getting worse.)     Review of Systems (ROS): As per the fellow's note.  Past Medical, Family, Social History:  As per the fellow's  note.    Objective:   I have examined the patient and I concur with the fellow's exam.  Assessment and plan reviewed with the fellow. I agree with the fellow's plan as documented.  See the fellow's note for further details.

## 2019-08-08 ENCOUNTER — Other Ambulatory Visit: Payer: Self-pay

## 2019-08-16 ENCOUNTER — Other Ambulatory Visit: Payer: Self-pay

## 2019-08-16 ENCOUNTER — Telehealth (INDEPENDENT_AMBULATORY_CARE_PROVIDER_SITE_OTHER): Payer: BC Managed Care – PPO | Admitting: Orthopaedic Surgery of the Spine

## 2019-08-16 DIAGNOSIS — M545 Low back pain, unspecified: Secondary | ICD-10-CM

## 2019-08-16 DIAGNOSIS — G8929 Other chronic pain: Secondary | ICD-10-CM

## 2019-08-16 DIAGNOSIS — M4310 Spondylolisthesis, site unspecified: Secondary | ICD-10-CM

## 2019-08-23 ENCOUNTER — Ambulatory Visit (HOSPITAL_BASED_OUTPATIENT_CLINIC_OR_DEPARTMENT_OTHER): Payer: BC Managed Care – PPO | Admitting: Physical Medicine & Rehabilitation

## 2019-08-24 ENCOUNTER — Telehealth (HOSPITAL_BASED_OUTPATIENT_CLINIC_OR_DEPARTMENT_OTHER): Payer: Self-pay | Admitting: Orthopaedic Surgery of the Spine

## 2019-08-24 NOTE — Telephone Encounter (Signed)
Returned patient call. Left message to call back to schedule surgery.     Send message to Dr. Zlomislic patient is calling to schedule surgery but no surgery order in. Please enter order.    Please transfer call when patient calls back.

## 2019-08-28 NOTE — Telephone Encounter (Signed)
Patient is returning your call please call patient back thank you

## 2019-09-05 ENCOUNTER — Encounter (INDEPENDENT_AMBULATORY_CARE_PROVIDER_SITE_OTHER): Payer: Self-pay | Admitting: Orthopaedic Surgery of the Spine

## 2019-09-06 ENCOUNTER — Encounter (INDEPENDENT_AMBULATORY_CARE_PROVIDER_SITE_OTHER): Payer: Self-pay | Admitting: Orthopaedic Surgery of the Spine

## 2019-09-06 NOTE — Telephone Encounter (Signed)
From: [name]  To: [name], MD  Sent: 02/28/7627 4:34 PM PST  Subject: 20-Other    Hi Doc Z and team,    I'm wondering if I could have you order a sitting MRI? I don't know if [facility] does them or not, but I know there's a place called [facility] that told me they take my insurance: [facility], [address] [place]. Phone: [phone]. Fax: [phone]    I'm still waiting to get scheduled for surgery as we discussed 2 weeks ago, but I wanted to let you know I did end up getting the fluid behind my spine aspirated on Dec 23. I did get some relief of pain especially in my toes and some pressure from my back from doing that. They also did cultures of the fluid and nothing positive has come back as of yet. I am, however, still having a lot of low back pain as well as a sensation in certain positions (sitting being one of them) of something almost "grabbing" at my big toes and further down that line of my feet. This was somewhat more clear after the fluid was removed. These symptoms seem to be worse while sitting and I get a really awful "headache" (pain up the back of my neck and head up both sides almost to my eyes) of a sort I've not experienced before this surgery while sitting, especially on something soft like a couch. That's why I was wondering if a sitting MRI could help shed some more light on what's happening. If there is   anything else I could do diagnostically to figure this out please let me know as it seems with covid it may be a bit of a wait until I get scheduled for surgery anyways.    Thanks,  [name]

## 2019-09-06 NOTE — Progress Notes (Signed)
Today I spent a total of 40 minutes of face-to-face time, with greater than 50% of the time spent in counseling and coordination of care.    We reviewed CT. Some lucency around screws. We discussed that her pain experience is out of proportion from what I am seeing on imaging. She should discuss with Dr. Earnest Conroy to see what how he would like to proceed.

## 2019-09-11 NOTE — Telephone Encounter (Signed)
Patient returning call back to schedule Surgery with Dr Herma Carson    Phone: [phone]

## 2019-09-12 ENCOUNTER — Encounter (INDEPENDENT_AMBULATORY_CARE_PROVIDER_SITE_OTHER): Payer: Self-pay | Admitting: Orthopaedic Surgery of the Spine

## 2019-09-12 NOTE — Telephone Encounter (Signed)
Returned patient call. Left ,essage for patient to call back.    Please transfer call when patient calls back.

## 2019-09-19 ENCOUNTER — Encounter (INDEPENDENT_AMBULATORY_CARE_PROVIDER_SITE_OTHER): Payer: Self-pay | Admitting: Orthopaedic Surgery of the Spine

## 2019-09-19 ENCOUNTER — Telehealth (HOSPITAL_BASED_OUTPATIENT_CLINIC_OR_DEPARTMENT_OTHER): Payer: Self-pay | Admitting: Orthopaedic Surgery of the Spine

## 2019-09-19 NOTE — Telephone Encounter (Signed)
Pt requesting to speak with [facility] surgery coordinator.  Regarding approval and order for surgery.      [phone]

## 2019-09-20 ENCOUNTER — Telehealth (HOSPITAL_BASED_OUTPATIENT_CLINIC_OR_DEPARTMENT_OTHER): Payer: Self-pay | Admitting: Orthopaedic Surgery of the Spine

## 2019-09-20 NOTE — Telephone Encounter (Signed)
Returned patient call. Left message for patient to call back. Patient wants to schedule surgery but I don't have a surgery order. A second message has been send to Dr. Christa See to enter surgery order and I will call patient to schedule surgery.    Please transfer call when patient calls back.

## 2019-09-20 NOTE — Telephone Encounter (Signed)
From: [name]  To: [name], MD  Sent: 09/19/2019 37:10 AM PST  Subject: 2-Procedural Question    Hi,    It seems that my order was never put in for surgery at my last visit. I just discovered this last week and spoke to [facility]. But apparently she still does not have it. Is there a way to get this ASAP? It's been a little over a month since I even spoke to Doc Z now and originally he though I would be in surgery around this time.    Thanks,  [name]

## 2019-09-21 ENCOUNTER — Encounter: Payer: Self-pay | Admitting: Hospital

## 2019-09-21 DIAGNOSIS — M545 Low back pain, unspecified: Secondary | ICD-10-CM | POA: Insufficient documentation

## 2019-09-21 NOTE — Progress Notes (Signed)
ORTHOPAEDIC SPINE SURGERY     Visit Type: Office Visit    Reason For Visit: No chief complaint on file.    Surgery Date: 01/03/2019    Procedure: ROI, revision XLIF L4-5 and PSIF L4-5     History Of Present Illness: 39 year old female returns to spine clinic now approximately 7 months s/p revision lumbar fusion L4-5 as noted above. She is seen today by video telehealth as a result of covid pandemic. She states that she continues to have significant low back pain that is ongoing and limits her activities. At times she feels better, however she continues to have pain that is easily exacerbated by activities and states that she is at times unable to get out of bed as a results. She relates majority of her pain to the her low back but also complaints of generalized thoracolumbar back pain. She describes intermittent feelings of pain and weakness into bilateral lower extremities. She has no focal weakness, but describes feelings of her legs being unsteady at times.  She has undergone trial of PT, but that had resulted in exacerbation of her symptoms. Most recently, she had trial of si joint injections which did not provide her any relief of symptoms. She continues to take pain medications for pain control. To review briefly, she underwent wide lumbar laminectomy and instrumentation L4-5 in [place] about one year ago following which she continued to have severe pain, and ultimately proceeded with revision lumbar stabilization with fusion L4-5 as noted above. No issues with incisions. Patient rates her pain as a  . She has had multiple attempts at injections without any sustained relief. Patient reports no changes in bowel and bladder function. Patient returns for routine follow up.    Allergies:   Allergies   Allergen Reactions   . Sulfa Drugs Nausea Only and Anxiety   . Tramadol Nausea and Vomiting       Medications:   Current Outpatient Medications   Medication Sig   . acetaminophen (TYLENOL) 325 MG tablet Take 3 tablets  (975 mg) by mouth every 8 hours.   . ALPRAZolam (XANAX) 0.25 MG tablet take 1 tablet by mouth once daily if needed   . Amphetamine-Dextroamphetamine (ADDERALL PO) as needed.   . clindamycin (CLEOCIN T) 1 % solution Apply 1 Application topically 2 times daily. Use a small amount as directed   . clindamycin (CLEOCIN) 300 MG capsule Take 1 capsule (300 mg) by mouth 3 times daily.   . cyclobenzaprine (FLEXERIL) 5 MG tablet Take 1 tablet (5 mg) by mouth 3 times daily as needed for Muscle Spasms.   . diazepam (VALIUM) 5 MG tablet Take 1 tablet (5 mg) by mouth every 6 hours as needed for Insomnia or Muscle Spasms.   . diclofenac (VOLTAREN) 1 % gel Apply 1 g topically 4 times daily.   . drospirenone-ethinyl estradiol (YAZ) 3-0.02 MG tablet Take 1 tablet by mouth daily.   . DULoxetine (CYMBALTA) 30 MG CR capsule Take 1 capsule (30 mg) by mouth 2 times daily.   Marland Kitchen HYDROcodone-acetaminophen (NORCO) 5-325 MG tablet Take 1 tablet by mouth every 6 hours as needed for Moderate Pain (Pain Score 4-6).   Marland Kitchen ondansetron (ZOFRAN ODT) 4 MG disintegrating tablet Dissolve 1 tablet (4 mg) by mouth every 8 hours as needed for Nausea/Vomiting.   Marland Kitchen oxyCODONE-acetaminophen (PERCOCET) 5-325 MG tablet Take 1 tablet by mouth every 6 hours as needed for Severe Pain (Pain Score 7-10).   . pregabalin (LYRICA) 75 MG capsule  Take 1 capsule (75 mg) by mouth 2 times daily.   Marland Kitchen tiZANidine (ZANAFLEX) 4 MG tablet Take 1 tablet (4 mg) by mouth every 6 hours as needed (spasm).   Marland Kitchen zolpidem (AMBIEN) 5 MG tablet Take 1 tablet (5 mg) by mouth nightly as needed for Insomnia.     No current facility-administered medications for this visit.          Review of Systems:  CONSTITUTIONAL: No unexpected weight loss, fevers, chills, or anorexia. SKIN: Noncontributory. GENITOURINARY: Symptoms as noted above in HPI. NEUROLOGIC: Symptoms as noted above in HPI. MUSCULOSKELETAL: Symptoms as noted above in HPI.    Physical Examination:  There were no vitals taken for this  visit.  CONSTITUTIONAL: Well appearing and well groomed. PSYCHOLOGICAL: Alert and oriented and appropriate to situation. INTEGUMENT: Intact. VASCULAR: Radial and pedal pulses are intact and symmetric. EXTREMITIES: Bilateral upper and lower extremities have good range of motion and no significant deformities. GAIT: Brisk with good coordination.     CERVICAL SPINE: Nontender to palpation. Spurling's test negative. Sensation intact of the upper extremities.    RANGE OF MOTION (in degrees)       RT LT  Lateral motion   80  80  Lateral bend   45  45  Flexion 40 degrees. Extension 35 degrees. Motion in all planes is nonpainful.    MOTOR    RT LT  Trapezius   5/5  5/5  Deltoid    5/5  5/5  Bicep    5/5  5/5  Tricep    5/5  5/5  Wrist flexor   5/5  5/5  Wrist extensor  5/5  5/5  Intrinsics   5/5  5/5  Grip    5/5  5/5    DEEP TENDON REFLEXES       RT  LT  Bicep    2+  2+  Brachioradialis  2+  2+  Tricep    2+  2+    THORACIC SPINE  Nontender to percussion. Alignment - normal, no paravertebral muscle fullness. Sensation intact. Beevor's negative. Abdominal reflexes are absent and symmetric.     LUMBAR SPINE  Left lumbosacral, paraspinal and SI joint tenderness to palpation. Heel to toe walk with difficulty due to pain. Sensation intact of the lower extremities. Trendelenburg sign negative. Skin intact.    RANGE OF MOTION        RT  LT  lateral bend, normal at 25 degrees 25  25  rotation, normal at 30 degrees 30  30  Flexion - normal at 40 degrees. Extension - normal at 10 degrees.    MOTOR STRENGTH       RT  LT  Iliopsoas   5/5  5/5  Quadricep   5/5  5/5  Anterior tibialis   5/5  5/5  Extensor hallucis longus 5/5  5/5  Gastrocsoleus  5/5  5/5    DEEP TENDON REFLEXES       RT  LT  Patellar   2+  2+  Achilles   2+  2+    PATHOLOGIC REFLEXES      RT LT  Clonus    Neg Neg  Babinski   Neg Neg  Hoffmans   Neg Neg    STRAIGHT LEG RAISING       RT  LT  Sitting @ 90 degrees   Neg Neg    IMAGING STUDIES:   X-ray Lumbosacral Spine 2 Or  3 Views    Result  Date: 06/14/2019  IMPRESSION: Posterior decompression and combined anterior and posterior instrumented fixation at L4-L5 without evidence of hardware complication. 7 mm anterolisthesis of L4 over L5 without change between the lateral flexion-extension views.    X-ray Lumbosacral Spine 2 Or 3 Views    Result Date: 06/14/2019  IMPRESSION: No interval change. Status post L4-L5 pedicle screws instrumentation, anterior interbody grafting and posterior bone grafting after decompression without change in alignment or hardware complication.    CT lumbar spine shows:  Posterior decompression and instrumented fixation at L4-L5 and placement of an interbody graft at L4-L5.    Minimal lucency surrounding the L4 transpedicular screws may represent micro motion. The interbody graft is not fully incorporated.    4 mm anterolisthesis of L4 on L5.    Persistent fluid collection in the surgical bed, similar to the MRI of 04/18/2019 may represent a seroma. Sampling of the fluid collection could be obtained if there is a clinical concern about an infection.        OTHER MEDICAL RECORDS/IMAGING STUDIES REVIEWED: None    IMPRESSION:   S/p lumbar fusion  Chronic low back pain     PLAN/DISCUSSION: Reviewed clinical history as well as exam results and imaging findings with patient in detail. Review of prior imaging again shows intact instrumentation with near anatomic alignment, with slight residual spondylolisthesis, without evidence of severe stenosis. There is minimal lucency around pedicle screw at L4 without frank halo formation, likely related to reinstrumentation given revision surgery following an index instrumentation procedure in [place] that resulted in severe disability and pain.  In addition graft does appear to be incorporating without evidence of subsidence or lucency.  She does continue to have symptoms of severe low back pain, which at times is near completely incapacitating, and which seems out of  proportion to her imaging findings, and continues to have intermittent radicular type complaints, though no evidence of stenosis on imaging, and therefore etiology of her symptoms is not entirely clear. At her index surgery in [place] there was complete resection of posterior elements violating the level at L3-4 and she has associated pain and "feelings of instability" at this level just above her fusion, however she has not had relief with injections.  We discussed options at length. We have discussed possibility of need to extend fusion to L3 though given the nonspecific nature of her low back pain and pain that does not correlate with her imaging findings, would not recommend further fusion level. She states that she is unable to engage in any PT because it causes too much pain and is interested in surgical address. We discussed possible revision fusion L4-5 with exploration of wound and evacuation of seroma with possible removal of instrumentation if fusion in tact and she does wish to proceed. We have discussed the planned procedure as well as r/b/a in detail. I have described the surgery / procedure in detail.  All the patient's questions were answered. Arleen Bar understood the possible complications of surgery /  procedure which include but are not limited to: ongoing pain, numbness in the extremities, weakness, nerve injury, dural tear, infection, paralysis, spinal cord injury, need for further surgery, failure of fusion, possible need for reoperation if the fusion fails, instrumentation failure to include the need for repositioning of the implant, breakage, prolonged recuperation, bleeding, need for blood transfusions which includes the risk of a blood reaction, infection or disease, medical complications including pneumonia, prolonged intubation requiring respiratory support, tracheostomy, heart attack, stroke, deep vein thrombosis,  pulmonary embolism, blood vessel injury, anesthesia complications and  even death.  There is a risk of unsuccessful results from either foreseen or unforeseen causes. Having understood the risks and benefits the patient asked [place] to proceed with surgery / procedure. We have explained that there is chance she will have no relief of her back pain complaints and she is understanding of this. We have encouraged ongoing core strengthening and PT in the interim.  She will need formal signing of consents. All questions were answered and Paislie Tessler understood and was satisfied with this plan.     FOLLOWUP: Return to clinic: for postop check or in 3 months for follow up. The patient is encouraged to call us with any questions or problems in the interim. Our contact numbers were given to the patient.      ---------------------(data below generated by Hyman Hopes, MD)--------------------     Patient Verification & Telemedicine Consent & Financial Waiver:    1.   Identity: I have verified this patient's identity to be accurate.  2.   Consent: I verify consent has been secured in one of the following methods: (a) obtained written/ online attestation consent (via MyChartVideoVisit pathway), (b) the spoke-side provider has obtained verbal or written consent from patient/surrogate (if this is a "provider to provider" evaluation), or (c) in all other cases, I have personally obtained verbal consent from the patient/ surrogate (noting all elements below) to perform this voluntary telemedicine evaluation (including obtaining history, performing examination and reviewing data provided by the patient).   The patient/ surrogate has the right to refuse this evaluation.  I have explained risks (including potential loss of confidentiality), benefits, alternatives, and the potential need for subsequent face to face care. Patient/ surrogate understands that there is a risk of medical inaccuracies given that our recommendations will be made based on reported data (and we must therefore assume this  information is accurate).  Knowing that there is a risk that this information is not reported accurately, and that the telemedicine video, audio, or data feed may be incomplete, the patient agrees to proceed with evaluation and holds [place] harmless knowing these risks.  3.   Healthcare Team: The patient/ surrogate has been notified that other healthcare professionals (including students, residents and Engineer, maintenance) may be involved in this audio-video evaluation.   All laws concerning confidentiality and patient access to medical records and copies of medical records apply to telemedicine.  4.   Privacy: If this is a Restaurant manager, fast food Visit, the patient/ surrogate has received the [place] Notice of Privacy Practices via E-Checkin process.  For all other video visit techniques, I have verbally provided the patient/ surrogate with the  web link in [place] (https://health.[email or web address].aspx) or Spanish ([email or web address]://[email or web address]).  The patient/ surrogate acknowledges both being provided the NPP link, and has been offered to have the NPP mailed to the patient/ surrogate by [place] mail.  The patient/ surrogate has voiced understanding an acknowledgement of receipt of this NPP web address.  If the patient/surrogate has elected to receive the NPP via [place] mail, I verify that the NPP will be sent promptly to the patient/surrogate via [place] mail.  5.   Capacity: I have reviewed this above verification and consent paragraph with the patient/ surrogate and the patient is capacitated or has a surrogate. If the patient is not capacitated to understand the above, and no surrogate is available, since this is not an emergency evaluation, the visit will be rescheduled until such time that the patient can  consent, or the surrogate is available to consent. If this is an emergency evaluation and the patient is not capacitated to understand the above, and no surrogate is available, I am proceeding with this  evaluation as this is felt to be an emergency setting and no appropriate specialist is available at the bedside to perform these evaluations.  6.   Financial Waiver: If this is a Restaurant manager, fast foodMyChart Video Visit, the patient has been made aware of the financial waiver via E-Checkin process.  For all other video visit techniques, an E-Checkin process is not performed.  As such, I have personally verbally informed the patient/ surrogate that this evaluation will be a billable encounter similar to an in-person clinic visit, and the patient/ surrogate has agreed to pay the fee for services rendered.  If we are billing insurance for the patient's telehealth visit, her out-of-pocket cost will be determined based on her plan and will be billed to her.  The patient/ surrogate has also been informed that if the patient does not have insurance or does not wish to use insurance, [facility] [address] [facility]'s cash price for a primary care telehealth visit is $59.00 and specialist telehealth visit is $88.00.  I have further informed the patient/ surrogate that in the event the patient has additional services provided in conjunction with the specialty visit (Ex. Psychotherapy services), those services will be billed at the current rate less a 45% discount.  7.   Intra-State Location: The patient/ surrogate attests to understanding that if the patient accesses these services from a location outside of [place], that the patient does so at the patient's own risk and initiative and that the patient is ultimately responsible for compliance with any laws or regulations associated with the patient's use.  8.   Specific Use:The patient/ surrogate understands that [place] makes no representation that materials or servicesdelivered via telecommunication services, or listed on telemedicine websites, are appropriate or available for use in any other location.           Demographics:  Medical Record #: [id number]  Date: August 16, 2019  Patient Name: Haley HoehnMegan  Barrett  DOB: 1980/11/24  Age: 39 year old  Sex: female  Location: Home address on file     Evaluator(s):  Haley Barrett was evaluated by me today.    Clinic Location: [facility]  [address]  [place] [id number]

## 2019-09-26 ENCOUNTER — Encounter (HOSPITAL_BASED_OUTPATIENT_CLINIC_OR_DEPARTMENT_OTHER): Payer: Self-pay | Admitting: Orthopaedic Surgery of the Spine

## 2019-09-27 ENCOUNTER — Telehealth (INDEPENDENT_AMBULATORY_CARE_PROVIDER_SITE_OTHER): Payer: Self-pay | Admitting: Orthopaedic Surgery of the Spine

## 2019-09-27 ENCOUNTER — Telehealth (INDEPENDENT_AMBULATORY_CARE_PROVIDER_SITE_OTHER): Payer: BC Managed Care – PPO | Admitting: Orthopaedic Surgery of the Spine

## 2019-09-27 DIAGNOSIS — S32009K Unspecified fracture of unspecified lumbar vertebra, subsequent encounter for fracture with nonunion: Secondary | ICD-10-CM

## 2019-09-27 DIAGNOSIS — Z981 Arthrodesis status: Secondary | ICD-10-CM

## 2019-09-27 NOTE — Telephone Encounter (Signed)
Let patient know Dr. Herma Carson is with another patient and will be with her in approx . She will stay online.

## 2019-10-02 ENCOUNTER — Telehealth (HOSPITAL_BASED_OUTPATIENT_CLINIC_OR_DEPARTMENT_OTHER): Payer: Self-pay | Admitting: Orthopaedic Surgery of the Spine

## 2019-10-02 ENCOUNTER — Ambulatory Visit (HOSPITAL_BASED_OUTPATIENT_CLINIC_OR_DEPARTMENT_OTHER): Payer: BC Managed Care – PPO

## 2019-10-02 ENCOUNTER — Encounter (INDEPENDENT_AMBULATORY_CARE_PROVIDER_SITE_OTHER): Payer: Self-pay | Admitting: Orthopaedic Surgery of the Spine

## 2019-10-02 NOTE — Telephone Encounter (Signed)
Haley Barrett  requested callback for advice on:    Patient states she discussed with Dr. Herma Carson at her last MCVV getting a spinal injection.    Patient would like to know at what level of her spine the injection will be given?    No current order in Epic, pt states the one ordered in November 2020 was not used but that Dr. Herma Carson was going to place a new order for her after her MCVV on 09/27/2019    Treating Physician: Dr. Herma Carson , [facility]      LOV notes/plan: Not completed yet        Best number to be reached at: Phone# [phone]            Route to RN pool if requesting clinical advice, route to MA if administrative advice

## 2019-10-03 ENCOUNTER — Encounter (INDEPENDENT_AMBULATORY_CARE_PROVIDER_SITE_OTHER): Payer: Self-pay

## 2019-10-03 ENCOUNTER — Encounter: Payer: Self-pay | Admitting: Hospital

## 2019-10-03 DIAGNOSIS — Z01818 Encounter for other preprocedural examination: Secondary | ICD-10-CM

## 2019-10-03 NOTE — Telephone Encounter (Signed)
Closing encounter. Refer to TE from [employer]

## 2019-10-03 NOTE — Telephone Encounter (Signed)
Spoke with patient.  Let her know prior Haley Barrett is needed for injection. Will schedule her for 10/09/19.  If procedure is not approved by 10/06/19 then she will be rescheduled.  Patient is ok with this.

## 2019-10-03 NOTE — Telephone Encounter (Signed)
Left msg for patient to call back regarding scheduling injection.

## 2019-10-03 NOTE — Telephone Encounter (Signed)
From: [name]  To: [name], MD  Sent: 10/02/2019 56:38 PM PST  Subject: 3-Referral Request Status    Hi Doc Z and staff,    Could you find out what I was supposed to do exactly for the pain management/injections portion this time around? I know there's an old order on there, but he mentioned nerve ablations and diagnostic injections. I'm not sure if these are the same, but could you let me know where to call and what the order is when it's in? I'm not sure that it is in the system yet and I can't seem to find any info on mychart.    Thanks,  [name]

## 2019-10-04 ENCOUNTER — Ambulatory Visit (HOSPITAL_BASED_OUTPATIENT_CLINIC_OR_DEPARTMENT_OTHER): Payer: BC Managed Care – PPO | Admitting: Physician Assistant

## 2019-10-05 ENCOUNTER — Encounter (HOSPITAL_BASED_OUTPATIENT_CLINIC_OR_DEPARTMENT_OTHER): Payer: Self-pay | Admitting: Anesthesiology

## 2019-10-05 ENCOUNTER — Other Ambulatory Visit (INDEPENDENT_AMBULATORY_CARE_PROVIDER_SITE_OTHER): Payer: Self-pay

## 2019-10-05 ENCOUNTER — Encounter (INDEPENDENT_AMBULATORY_CARE_PROVIDER_SITE_OTHER): Payer: Self-pay | Admitting: Anesthesiology

## 2019-10-05 DIAGNOSIS — R54 Age-related physical debility: Secondary | ICD-10-CM

## 2019-10-05 DIAGNOSIS — Z01818 Encounter for other preprocedural examination: Secondary | ICD-10-CM

## 2019-10-06 ENCOUNTER — Other Ambulatory Visit (INDEPENDENT_AMBULATORY_CARE_PROVIDER_SITE_OTHER): Payer: BC Managed Care – PPO

## 2019-10-06 NOTE — Telephone Encounter (Signed)
Returned patients call & answered questions.

## 2019-10-06 NOTE — Telephone Encounter (Signed)
Spoke w/patient letting her know still pending insurance approval. It may be going to a peer to peer. We will postpone injection until approved but will ask Dr. Herma Carson if she needs this done prior to surgery. Will be in contact with patient next week for update.

## 2019-10-06 NOTE — Telephone Encounter (Signed)
Pt requesting to speak to Myles Rosenthal in regards to her insurance for approval for the injection.

## 2019-10-07 ENCOUNTER — Ambulatory Visit
Admission: RE | Admit: 2019-10-07 | Discharge: 2019-10-07 | Disposition: A | Discharge status: Home / Self Care | Payer: BC Managed Care – PPO | Attending: Diagnostic Radiology | Admitting: Diagnostic Radiology

## 2019-10-07 ENCOUNTER — Ambulatory Visit (HOSPITAL_BASED_OUTPATIENT_CLINIC_OR_DEPARTMENT_OTHER): Payer: BC Managed Care – PPO

## 2019-10-07 DIAGNOSIS — Z981 Arthrodesis status: Secondary | ICD-10-CM

## 2019-10-07 DIAGNOSIS — M4316 Spondylolisthesis, lumbar region: Secondary | ICD-10-CM | POA: Insufficient documentation

## 2019-10-07 DIAGNOSIS — S32009K Unspecified fracture of unspecified lumbar vertebra, subsequent encounter for fracture with nonunion: Secondary | ICD-10-CM

## 2019-10-09 ENCOUNTER — Encounter: Payer: Self-pay | Admitting: Hospital

## 2019-10-09 ENCOUNTER — Ambulatory Visit (AMBULATORY_SURGERY_CENTER): Admit: 2019-10-09 | Payer: Self-pay | Admitting: Physical Medicine and Rehab

## 2019-10-09 ENCOUNTER — Other Ambulatory Visit (INDEPENDENT_AMBULATORY_CARE_PROVIDER_SITE_OTHER): Payer: BC Managed Care – PPO

## 2019-10-09 SURGERY — PAIN INJECTION, PARAVERT FACET LUMBAR OR SACRAL 1ST
Laterality: Bilateral

## 2019-10-10 ENCOUNTER — Encounter (INDEPENDENT_AMBULATORY_CARE_PROVIDER_SITE_OTHER): Payer: Self-pay | Admitting: Anesthesiology

## 2019-10-10 ENCOUNTER — Telehealth (INDEPENDENT_AMBULATORY_CARE_PROVIDER_SITE_OTHER): Payer: Self-pay | Admitting: Orthopaedic Surgery of the Spine

## 2019-10-10 DIAGNOSIS — Z01818 Encounter for other preprocedural examination: Secondary | ICD-10-CM

## 2019-10-10 NOTE — Telephone Encounter (Signed)
Patient calling asking a call back regarding insurance auth for her injection, her number [phone].

## 2019-10-11 ENCOUNTER — Encounter (HOSPITAL_BASED_OUTPATIENT_CLINIC_OR_DEPARTMENT_OTHER): Payer: Self-pay | Admitting: Hospital

## 2019-10-11 NOTE — Telephone Encounter (Signed)
Patient calling back to ask that her authorization request be resubmitted .    She would like request to include the following words: "medically necessary" and include supporting documents with request when resubmitted.    Please call patient back when matter resolved.    Pt phone# [phone]

## 2019-10-11 NOTE — Telephone Encounter (Signed)
L/msg I spoke with NIA regarding auth for injection with Dr. Nedra Hai.  Procedure was denied.  Pending fax with denial explanation.

## 2019-10-13 NOTE — Telephone Encounter (Signed)
Called NIA to resubmit auth for injection with Dr. Nedra Hai.  Was informed Dr. Herma Carson can do a Peer to Peer.   Will set up next week to be done.

## 2019-10-14 ENCOUNTER — Ambulatory Visit (HOSPITAL_BASED_OUTPATIENT_CLINIC_OR_DEPARTMENT_OTHER): Payer: BC Managed Care – PPO

## 2019-10-18 ENCOUNTER — Telehealth (INDEPENDENT_AMBULATORY_CARE_PROVIDER_SITE_OTHER): Payer: Self-pay | Admitting: Orthopaedic Surgery of the Spine

## 2019-10-18 ENCOUNTER — Encounter (INDEPENDENT_AMBULATORY_CARE_PROVIDER_SITE_OTHER): Payer: BC Managed Care – PPO | Admitting: Orthopaedic Surgery of the Spine

## 2019-10-18 ENCOUNTER — Encounter (INDEPENDENT_AMBULATORY_CARE_PROVIDER_SITE_OTHER): Payer: Self-pay

## 2019-10-18 ENCOUNTER — Telehealth (INDEPENDENT_AMBULATORY_CARE_PROVIDER_SITE_OTHER): Payer: BC Managed Care – PPO | Admitting: Orthopaedic Surgery of the Spine

## 2019-10-18 DIAGNOSIS — Z981 Arthrodesis status: Secondary | ICD-10-CM

## 2019-10-18 NOTE — Telephone Encounter (Signed)
Spoke with patient regarding MCVV. Let patient know provider is running behind schedule, but will connect with patient when ready. Approx 

## 2019-10-20 ENCOUNTER — Encounter (INDEPENDENT_AMBULATORY_CARE_PROVIDER_SITE_OTHER): Payer: Self-pay | Admitting: Orthopaedic Surgery of the Spine

## 2019-10-20 NOTE — Telephone Encounter (Signed)
From: [name]  To: [name], MD  Sent: 10/20/2019 6:20 AM PST  Subject: 2-Procedural Question    I was a bit confused after last visit with Doc Z, but wondering if he had ordered radiofreuqency ablations and/or if there's a referral I could use to get in wherever is quickest as I'm still quite struggling with pain.    Thanks,  Haley Barrett    I also realized I think I've had the bone scan he mentioned at my last appointment with him at a second opinion. I"ll attempt to copy and past the notes in the following email and get him a copy of the cd.

## 2019-10-20 NOTE — Telephone Encounter (Signed)
From: [name]  To: [name], MD  Sent: 10/20/2019 8:10 AM PST  Subject: 1-Non Urgent Medical Advice    Here is the bone scan info. Will try and get him the imaging if that's helpful.    IMPRESSION: Planar bone scan and SPECT CT demonstrating  1. Mild increased tracer uptake in bilateral L3-L4 facet joints, suggestive of mild/early facet joint disease.  2. Post op changes from L4-L5 posterior instrumented fusion with L4-L5 interbody  Narrative  EXAMINATION: NUC MED BONE IMAGING SPECT, NUC MED BONE IMAGING LIMITED ARE  DATE/TIME: 08/01/2019 11:55 AM  HISTORY: Lumbar spondylosis, localize axial back pain generator  S/P L4-L5 fusion with L4-L5 interbody hardware  COMPARISON: Outside L-spine radiographs. Outside CT L-spine 07/11/2019  TECHNIQUE: Approximately 2-3 hour following IV injection of 21.7 mCi of MDP, planar bone scans and SPECT/CT are performed. Non-contrast low radiation dose CT images in this study are for radiotracer attenuation correction and localization only and not for replacement of diagnostic CT studies.  FINDINGS:  REGION IMAGED: Planar bone scans of the axial skeleton and SPECT CT of the lower thoracic spine, lumbar spine and sacrum  ABNORMALITIES: SPECT CT images shows post op changes from L4-L5 posterior instrumented fusion with L4-L5 interbody. There is calcification posterior to right L5 suggestive of post op changes.  There is mild increased tracer uptake in bilateral L3-L4 facet joints.  Planar bone scans demonstrate no significant additional bony abnormality.

## 2019-10-22 ENCOUNTER — Other Ambulatory Visit (INDEPENDENT_AMBULATORY_CARE_PROVIDER_SITE_OTHER): Payer: Self-pay | Admitting: Internal Medicine

## 2019-10-22 DIAGNOSIS — F32A Depression, unspecified: Secondary | ICD-10-CM

## 2019-10-23 ENCOUNTER — Encounter (INDEPENDENT_AMBULATORY_CARE_PROVIDER_SITE_OTHER): Payer: Self-pay | Admitting: Orthopaedic Surgery of the Spine

## 2019-10-24 ENCOUNTER — Telehealth (HOSPITAL_BASED_OUTPATIENT_CLINIC_OR_DEPARTMENT_OTHER): Payer: Self-pay | Admitting: Orthopaedic Surgery of the Spine

## 2019-10-24 NOTE — Telephone Encounter (Signed)
From: [name]  To: [name], MD  Sent: 10/23/2019 9:56 PM PST  Subject: 20-Other    Hi Doc Z's staff,    I know you'll say to make an appointment which I have but it is the day before surgery. Doc Z just mentioned in passing one option of trying to "live with the pain" and I just need to convey that the pain has become more and more intense and unrelenting and that really isn't an option to me. The pain became unbearable for me about 5-6 months post-op. I had a lot of trouble sitting with my left leg for a few months after surgery. Now my low back burns and burns, there can be so much pressure in the middle of my back when standing or laying on something soft that it takes my breath away. This seems to alternate between that area or having burning much lower in my back. I have to work hard to stay upright when walking. If I attempt to do anything like sitting to socialize for an hour (which I have done rarely the past few months.) I end up rolling around in burning pain often for hours afterwards.    I know he thinks it's unlikely all of this is from the metal, and I agree at this point, but I was unsure if I had missed an idea he had for a surgical solution other than that that he thought might help me as I know I've had trouble focusing during appointments due to the pain and had originally said no to a second-level fusion which I am re-thinking. I have made another appointment with him but it is not until the day before surgery. I am still willing to try simply removing the metal but was wondering if I'd missed out on hearing his idea before.    Thanks  [name]

## 2019-10-24 NOTE — Telephone Encounter (Signed)
From: [name]  To: [name], MD  Sent: 10/23/2019 3:53 PM PST  Subject: 2-Procedural Question    Oh and also could I get a referral to whatever kind of pain management he thinks you go to for this kind of pain? It's really so bad it makes me feel nauseous and sick so I am going to need help managing.    Thanks,  [name]

## 2019-10-24 NOTE — Telephone Encounter (Signed)
Haley Barrett  requested callback for advice on:    Patient is calling in to move on forward with injections with out insurance approval if ok. Please call patient to schedule appointment or discuss further.    Treating Physician:Dr Zlomislic      LOV notes/plan: 3/79/44  Home Health Telemedicine      Best number to be reached at: [phone]

## 2019-10-24 NOTE — Telephone Encounter (Signed)
From: [name]  To: [name], MD  Sent: 10/23/2019 9:45 PM PST  Subject: 1-Non Urgent Medical Advice    Also sorry one more email, could Doc Z please email me if he might have any best advice to give me going forward: even a guess or a surgery that might help reduce pain besides removing the metal as I know he doesn't think that'll help much. I have an appointment with him right before surgery but if I had options to think over that would be appreciated.    I know the level of pain I've been is is not really a sustainable one for me so looking for solutions.    Thanks,  [name]

## 2019-10-24 NOTE — Telephone Encounter (Signed)
Spoke with patient let her know that Dr. Herma Carson has to do a peer to peer for the request of injections.  I will be in contact with her tomorrow after talking to Dr. Herma Carson

## 2019-10-25 ENCOUNTER — Encounter (INDEPENDENT_AMBULATORY_CARE_PROVIDER_SITE_OTHER): Payer: BC Managed Care – PPO | Admitting: Orthopaedic Surgery of the Spine

## 2019-10-25 ENCOUNTER — Telehealth (HOSPITAL_BASED_OUTPATIENT_CLINIC_OR_DEPARTMENT_OTHER): Payer: Self-pay | Admitting: Orthopaedic Surgery of the Spine

## 2019-10-25 NOTE — Telephone Encounter (Signed)
Patient is calling state she has additional questions regards to her upcoming appt.     Patient would like to discuss further with Dr Herma Carson.

## 2019-10-26 ENCOUNTER — Encounter (HOSPITAL_BASED_OUTPATIENT_CLINIC_OR_DEPARTMENT_OTHER): Payer: Self-pay | Admitting: General Surgery

## 2019-10-26 ENCOUNTER — Ambulatory Visit: Payer: BC Managed Care – PPO | Admitting: General Surgery

## 2019-10-26 ENCOUNTER — Encounter (HOSPITAL_COMMUNITY): Payer: Self-pay | Admitting: Orthopaedic Surgery of the Spine

## 2019-10-26 DIAGNOSIS — Z01818 Encounter for other preprocedural examination: Secondary | ICD-10-CM

## 2019-10-26 MED ORDER — POLYETHYLENE GLYCOL 3350 OR POWD
17.0000 g | ORAL | Status: DC
Start: 2019-07-23 — End: 2019-12-07

## 2019-10-26 MED ORDER — GABAPENTIN 300 MG OR CAPS
300.0000 mg | ORAL_CAPSULE | Freq: Three times a day (TID) | ORAL | Status: AC
Start: 2019-10-09 — End: ?

## 2019-10-26 NOTE — Patient Instructions (Signed)
PREOPERATIVE SURGICAL INFORMATION     Your surgery is currently scheduled at [facility] on 11/02/19  The scheduler will be contacting you with the check in time      FACILITY ADDRESS      [facility], [place], [address] [place], [place] [address]   Please check in at Patient Services in [place] on 1st floor     PARKING    [facility] (including [place]): [employer] structure, Adult nurse structure, or Scientist, water quality parking (7am-5pm at [employer]; 5am-5pm at [employer]) for same cost as self-parking in the front entrance of the Medical Center   [email or web address]     QUESTIONS    If you have any questions between now and the day of your surgery, please do not hesitate to call:      [facility] Anesthesia Prepared[facility]: [phone]     DAY OF SURGERY ARRIVAL TIME:    On the day of your Surgery/Procedure, please arrive at the time provided by the surgery/preop team. If you have any questions regarding your arrival time, please call:     Preoperative Surgical Admissions at [facility] [phone]         MEDICATION INSTRUCTIONS:       MEDICATION INSTRUCTIONS BEFORE SURGERY/PROCEDURE:         OK to take the following medications as scheduled with a small sip of water on the morning of surgery:      PLEASE HOLD ALL NSAIDS (non-steroidal anti-inflammatory drugs) SUCH AS advil, aleve, motrin, ibuprofen, relafen, lodine, feldene, Diclofenac, voltaren, indomethacin, naproxen, celebrex, Mobic 7 days before surgery.        Please hold vitamins, supplements, herbs & fish oil 7 days before surgery.      It is OK to take acetaminophen (Tylenol) for pain around the time of surgery unless you have liver disease.       AFTER YOUR VISIT WITH [place], IF YOU START TAKING A NEW MEDICATION BEFORE SURGERY, PLEASE CALL us TO MAKE SURE IT IS SAFE TO TAKE & WILL NOT AFFECT YOUR SURGERY.         OSA  INSTRUCTIONS:      If you use a CPAP machine, please bring the entire CPAP machine, including mask and tubing, with you on the day of surgery.    TO DO LIST:    Surgical/procedure patients are required to have COVID 19 screening test 48-96 hrs prior to surgery.   Please call the scheduling line at [phone] between 8am and 5pm M-F to schedule an appointment if you do not have an appointment already.   Drive up testing locations at [place] require appointments, and are open 7 days a week except some holidays.        Labs to be done prior to surgery:      EATING/DRINKING          DO NOT EAT OR DRINK ANYTHING AFTER MIDNIGHT ON THE DAY OF SURGERY    Preparing for your Surgery:      Please wear clean loose-fitting clothes and leave valuables at home    Do not shave or remove body hair. Facial shaving is permitted. If you are having head surgery, ask your doctor whether you can shave.   Bring a picture ID and your insurance card, and be prepared to pay your deductible or co-insurance by cash, check, or credit card when you arrive.    If you  are going home after your surgery, please make sure to arrange for an adult to drive you home. You CANNOT use UBER or LYFT. If you do not have a ride, your surgery may be cancelled.     Visitor policy during the IXBOE-78 pandemic is subject to change. Current visitor policy can be found at [email or web address]     On The Day of Your Surgery:       Check in at the location mentioned above    If you are a woman of child bearing age, please note that you may be asked to give a urine sample upon check-in   You will meet your anesthesia and surgery teams in the preoperative holding area before surgery.    Once surgery is over, you will wake up in the recovery room.   If you go home, an adult chaperone will need to stay with you for the first 24 hours after surgery.    Visitor policy during the SXQKS-08 pandemic is subject to change. Current  visitor policy can be found at [email or web address]    A video about what to expect for the day of surgery can be found here:    [email or web address]  Or by searching "You-tube" for [place] before surgery and [place] after surgery     You medical records are available to you at [email or web address]        Preparing for your surgery    Shower with Chlorhexidine (CHG) soap to prevent infection    Instructions:   You should shower with CHG soap a minimum of three times before your surgery, or more often as directed by your surgeon.    Showering several times before surgery blocks germ growth and provides the best protection when used at least 3 times in a row.                      3 =                    +                  +               At least 3 showers       the morning the night the morning of   before surgery before surgery before surgery admission to surgery               Date:_______       Date:_______            Date:_______    How to shower with CHG Soap:  1. Rinse your body with warm water.  2. Wash your hair with regular shampoo. Rinse your hair with water. If you are having neck surgery, use CHG soap instead of your regular shampoo to wash your hair. Rinse your hair with water.  3. Wet a clean sponge. Turn off the water. Apply CHG liberally.  4. Firmly massage all areas: neck, arms, chest, back, abdomen, hips, groin, genitals (external only) and buttocks. Clean your legs and feet and between your fingers and toes. Pay special attention to the site of your surgery and all surrounding skin. Ask for help to clean your back if you have a spinal surgery.   5. Lather again before rinsing.  6. Turn on the water and rinse CHG off your body.  7. Dry off with a clean towel.  8. Don't apply lotions or powders.  9. Use clean clothes and freshly laundered bed linens.          Repeat steps 1- 9 each time you shower.      Caution: When using CHG soap, avoid contact  with eyes, nose, ear canals and mouth.      Important reminders:  . Do not use any other soaps or body wash when using CHG. Other soaps can block the CHG benefits.  . After showering, do not apply lotion, cream, powder, deodorant, or hair conditioner.  . Do not shave or remove body hair. Facial shaving is permitted. If you are having head surgery, ask your doctor whether you can shave.  . CHG is safe to use on minor wounds, rashes, burns, and over staples and stiches.  . Allergic reactions are rare but may occur. If you have an allergic reaction, stop using CHG and call your doctor if you have a skin irritation.  . If you are allergic to CHG, please follow the bathing instructions above using an over-the-counter regular soap instead of CHG.                              How to Use Chlorhexidine Before Surgery  Chlorhexidine gluconate (CHG) is a germ-killing (antiseptic) solution that is used to clean the skin. It gets rid of the bacteria that normally live on the skin. Cleaning your skin with CHG before surgery helps lower the risk for infection after surgery. To clean your skin before surgery, you may be given:   A CHG solution to use in the shower.   A prepackaged cloth that contains CHG.  What are the risks?  Risks of using CHG include:   A skin reaction.   Hearing loss, if CHG gets in your ears.   Eye injury, if CHG gets in your eyes and is not rinsed out.   The CHG product catching fire.  Make sure that you avoid smoking and flames after applying CHG to your skin.  Do not use CHG:   If you have a chlorhexidine allergy or have previously reacted to chlorhexidine.   On babies younger than 68 months of age.  How to use CHG solution     Use CHG only as told by your health care provider, and follow the instructions on the label.   Use CHG solution while taking a shower. Follow these steps when using CHG solution (unless your health care provider gives you different instructions):  1. Start the  shower.  2. Use your normal soap and shampoo to wash your face and hair.  3. Turn off the shower or move out of the shower stream.  4. Pour the CHG onto a clean washcloth. Do not use any type of brush or rough-edged sponge.  5. Starting at your neck, lather your body down to your toes. Make sure you:   Pay special attention to the part of your body where you will be having surgery. Scrub this area for at least 1 minute.   Use the full amount of CHG as directed. Usually, this is one bottle.   Do not use CHG on your head or face. If the solution gets into your ears or eyes, rinse them well with water.   Avoid your genital area.   Avoid any areas of skin that have broken skin, cuts, or scrapes.   Scrub your back and under your arms. Make sure to wash skin folds.  6. Let the lather sit on your skin for 1-2 minutes or as long as told by your health care provider.  7. Thoroughly rinse your entire body in the shower. Make sure that all body creases and crevices are rinsed well.  8. Dry off with a clean towel. Do not put any substances on your body afterward, such as powder, lotion, or perfume.  9. Put on clean clothes or pajamas.  10. If it is the night before your surgery, sleep in clean sheets.  How to use CHG prepackaged cloths     Only use CHG cloths as told by your health care provider, and follow the instructions on the label.   Use the CHG cloth on clean, dry skin. Follow these steps when using a CHG cloth (unless your health care provider gives you different instructions):  1. Using the CHG cloth, vigorously scrub the part of your body where you will be having surgery. Scrub using a back-and-forth motion for 3 minutes. The area on your body should be completely wet with CHG when you are done scrubbing.  2. Do not rinse. Discard the cloth and let the area air-dry for 1 minute. Do not put any substances on your body afterward, such as powder, lotion, or perfume.  3. Put on clean clothes or pajamas.  4. If it  is the night before your surgery, sleep in clean sheets.  Contact a health care provider if:   Your skin gets irritated after scrubbing.   You have questions about using your solution or cloth.  Get help right away if:   Your eyes become very red or swollen.   Your eyes itch badly.   Your skin itches badly and is red or swollen.   Your hearing changes.   You have trouble seeing.   You have swelling or tingling in your mouth or throat.   You have trouble breathing.   You swallow any chlorhexidine.  Summary   Chlorhexidine gluconate (CHG) is a germ-killing (antiseptic) solution that is used to clean the skin. Cleaning your skin with CHG before surgery helps lower the risk for infection after surgery.   You may be given CHG to use at home. It may be in a bottle or in a prepackaged cloth to use on your skin. Carefully follow your health care provider's instructions and the instructions on the product label.   Do not use CHG if you have a chlorhexidine allergy.   Contact your health care provider if your skin gets irritated after scrubbing.  This information is not intended to replace advice given to you by your health care provider. Make sure you discuss any questions you have with your health care provider.  Document Released: 05/18/2012 Document Revised: 07/22/2017 Document Reviewed: 07/22/2017  Elsevier Interactive Patient Education  2019 [employer].

## 2019-10-26 NOTE — Anesthesia Preprocedure Evaluation (Addendum)
ANESTHESIA PRE-OPERATIVE EVALUATION    Patient Information    Name: Haley Barrett    MRN: [id number]    DOB: 1981-08-12    Age: 39 year old    Sex: female  Procedure(s):  Posterior lumbar decompression with instrumentation, possible fusion, lumbar 4 to 5      Pre-op Vitals:   There were no vitals taken for this visit.        Primary language spoken:  English    ROS/Medical History:      History of Present Illness: 39 y/o female  s/p revision  XLIF L4-5 and PSIF L4-5 12/2018 continues to have significant low back pain that is ongoing and limits her activities    --pending Posterior lumbar decompression with instrumentation, possible fusion, lumbar 4 to 5  11/02/19    General:  Weight 180  >57mt Cardiovascular:  negative cardio ROS  Limited by pain    No cp, no sob    12/2018 SB    07/2018  Normal sinus rhythm   Prominent early anterior forces c/w normal variant   Anesthesia History:  chronic pain patient (percocet 515mx 4 times a day),  Phone anes eval  Needs exam    VERY concerned about pain control -     12/2018  Mac 3; Laryngoscope View Grade 1; Laryngoscopy Technique Laryngoscope; Airway Device ETT - Cuffed; Size (mm) 7.0;    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                      Phone anes eval: Pre op instructions reviewed and emailed               Physical Exam    Airway:    Inter-inciser distance > 4 cm  Prognanth Able    Mallampati: I  Neck ROM: full  TM distance: 5-6 cm  Short thick neck:  No          Cardiovascular:  - cardiovascular exam normal         Pulmonary:  - pulmonary exam normal           Neuro/Neck/Skeletal/Skin:      Dental:      Abdominal:              Last  OSA (STOP BANG) Score:  No data recorded    Last OSA  (STOP) Score for   Has a physician diagnosed you with sleep apnea?: No  Do you use a CPAP at home?: No  Do you snore loudly (loud enough to be heard through a closed door)?: 0  Do you often feel tired, fatigued or sleepy during the day?: 0  Has anyone observed that you stop breathing while you are sleeping?: 0  Have you ever been treated for high blood pressure?: 0  OSA total score (A score of  2 or more is high risk. Offer patient sleep study.): 0        Has a physician diagnosed you with sleep apnea?: No  Do you use a CPAP at home?: No  OSA total score (A score of 2 or more is high risk. Offer patient sleep study.): 0    Past Medical History:   Diagnosis Date   . ADD (attention deficit disorder)    . Clavicular fracture      Past Surgical History:   Procedure Laterality Date   . BACK SURGERY  07/28/2018    In [place]   . HUMERUS FRACTURE SURGERY Left 1993   . ------------OTHER-------------  39 yo    humerus ORIF      Social History     Tobacco Use   . Smoking status: Never Smoker   . Smokeless tobacco: Never Used   Substance Use Topics   . Alcohol use: Yes     Comment: socially, couple drinks/week    . Drug use: Yes     Types: Cannabinoid (CBD)     Comment: edibles       Current Outpatient Medications   Medication Sig Dispense Refill   . acetaminophen (TYLENOL) 325 MG tablet Take 3 tablets (975 mg) by mouth every 8 hours. 90 tablet 1   . ALPRAZolam (XANAX) 0.25 MG tablet take 1 tablet by mouth once daily if needed     . clindamycin (CLEOCIN T) 1 % solution Apply 1 Application topically 2 times daily. Use a small amount as directed 1 bottle 2   . clindamycin (CLEOCIN) 300 MG capsule Take 1 capsule (300 mg) by mouth 3 times daily. 40 capsule 0   . cyclobenzaprine (FLEXERIL) 5 MG  tablet Take 1 tablet (5 mg) by mouth 3 times daily as needed for Muscle Spasms. 30 tablet 0   . diazepam (VALIUM) 5 MG tablet Take 1 tablet (5 mg) by mouth every 6 hours as needed for Insomnia or Muscle Spasms. 30 tablet 0   . diclofenac (VOLTAREN) 1 % gel Apply 1 g topically 4 times daily. 1 Tube 3   . drospirenone-ethinyl estradiol (YAZ) 3-0.02 MG tablet Take 1 tablet by mouth daily. 84 tablet 3   . DULoxetine (CYMBALTA) 30 MG CR capsule Take 1 capsule (30 mg) by mouth 2 times daily. 180 capsule 1   . gabapentin (NEURONTIN) 300 MG capsule Take 300 mg by mouth 3 times daily.     Marland Kitchen HYDROcodone-acetaminophen (NORCO) 5-325 MG tablet Take 1 tablet by mouth every 6 hours as needed for Moderate Pain (Pain Score 4-6). 60 tablet 0   . ondansetron (ZOFRAN ODT) 4 MG disintegrating tablet Dissolve 1 tablet (4 mg) by mouth every 8 hours as needed for Nausea/Vomiting. 4 tablet 0   . oxyCODONE-acetaminophen (PERCOCET) 5-325 MG tablet Take 1 tablet by mouth every 6 hours as needed for Severe Pain (Pain Score 7-10).     . polyethylene glycol (GLYCOLAX) 17 GM/SCOOP powder 17 g by Oral route.     Marland Kitchen tiZANidine (ZANAFLEX) 4 MG tablet Take 1 tablet (4 mg) by mouth every 6 hours as needed (spasm). 50 tablet 0   . zolpidem (AMBIEN) 5 MG tablet Take 1 tablet (5 mg) by mouth nightly as needed for Insomnia. 30 tablet 0     No current facility-administered medications for this visit.      Allergies   Allergen Reactions   . Sulfa Drugs Nausea Only and Anxiety   . Tramadol Nausea and Vomiting  Labs and Other Data  Lab Results   Component Value Date    NA 136 03/01/2019    K 4.1 03/01/2019    CL 101 03/01/2019    BICARB 24 03/01/2019    BUN 8 03/01/2019    CREAT 0.78 03/01/2019    GLU 101 (H) 03/01/2019    Saks 9.9 03/01/2019     Lab Results   Component Value Date    AST 31 03/01/2019    ALT 24 03/01/2019    ALK 71 03/01/2019    TP 7.4 03/01/2019    ALB 4.3 03/01/2019    TBILI 0.35 03/01/2019     Lab Results   Component Value Date    WBC 6.1  03/01/2019    RBC 4.32 03/01/2019    HGB 12.6 03/01/2019    HCT 39.8 03/01/2019    MCV 92.1 03/01/2019    MCHC 31.7 (L) 03/01/2019    RDW 12.5 03/01/2019    PLT 319 03/01/2019    MPV 8.6 (L) 03/01/2019    SEG 40 03/01/2019    LYMPHS 49 03/01/2019    MONOS 9 03/01/2019    EOS 2 03/01/2019    BASOS 1 03/01/2019     Lab Results   Component Value Date    INR 0.9 07/11/2018     Lab Results   Component Value Date    ARTPH 7.29 (L) 01/03/2019    ARTPO2 260 (H) 01/03/2019    ARTPCO2 41 01/03/2019       Anesthesia Plan:  Risks and Benefits of Anesthesia  I have personally performed an appropriate pre-anesthesia physical exam of the patient (including heart, lungs, and airway) prior to the anesthetic and reviewed the pertinent medical history, drug and allergy history, laboratory and imaging studies and consultations.   I have determined that the patient has had adequate assessment and testing.  I have validated the documentation of these elements of the patient exam and/or have made necessary changes to reflect my own observations during my pre-anesthesia exam.  Anesthetic techniques, invasive monitors, anesthetic drugs for induction, maintenance and post-operative analgesia, risks and alternatives have been explained to the patient and/or patient's representatives.    I have prescribed the anesthetic plan:         Planned anesthesia method: General         ASA 2 (Mild systemic disease)     Potential anesthesia problems identified and risks including but not limited to the following were discussed with patient and/or patient's representative: Adverse or allergic drug reaction, Recall, Nerve injury, Dental injury or sore throat, Ocular injury, Injury to brain, heart and other organs and Death        Planned monitoring method: Routine monitoring    Informed Consent:  Anesthetic plan and risks discussed with Patient and Parent/Guardian.    Plan discussed with Attending.

## 2019-10-27 ENCOUNTER — Other Ambulatory Visit (INDEPENDENT_AMBULATORY_CARE_PROVIDER_SITE_OTHER): Payer: Self-pay | Admitting: Orthopaedic Surgery of the Spine

## 2019-10-27 DIAGNOSIS — M4310 Spondylolisthesis, site unspecified: Secondary | ICD-10-CM

## 2019-10-27 DIAGNOSIS — M545 Low back pain, unspecified: Secondary | ICD-10-CM

## 2019-10-27 NOTE — Telephone Encounter (Signed)
appt made for 2/24 with Primitivo Gauze to discuss by surgery scheduler    Closing encounter

## 2019-10-27 NOTE — Telephone Encounter (Signed)
From: [name]  To: [name], MD  Sent: 10/26/2019 8:18 PM PST  Subject: 20-Other    Hi,    I believe I'm on a list, but just wanted to reach out on here. I'd really like to speak to Doctor Z before my surgery next week.    I wanted to let him know I had injections done today and that they did help relieve a lot of my pain for a few hours. It did not help with the "queasy" pulling and occasional burning feeling above that area but I did get a lot relief for awhile around what I believe was the area it was intended to relieve pain and that is a large portion of my pain. I asked the office to send the info of where they did injections and they should be faxing them over from [facility].    I'd like to know if Doc Z would recommend anything differently for surgery based on this information.    Thanks,  [name]

## 2019-10-28 ENCOUNTER — Encounter (HOSPITAL_COMMUNITY): Payer: Self-pay | Admitting: Orthopaedic Surgery of the Spine

## 2019-10-30 ENCOUNTER — Other Ambulatory Visit (INDEPENDENT_AMBULATORY_CARE_PROVIDER_SITE_OTHER): Payer: BC Managed Care – PPO | Attending: Orthopaedic Surgery of the Spine

## 2019-10-30 ENCOUNTER — Ambulatory Visit (HOSPITAL_BASED_OUTPATIENT_CLINIC_OR_DEPARTMENT_OTHER): Payer: BC Managed Care – PPO | Admitting: Orthopaedic Surgery of the Spine

## 2019-10-30 DIAGNOSIS — M545 Low back pain: Secondary | ICD-10-CM | POA: Insufficient documentation

## 2019-10-30 DIAGNOSIS — M4310 Spondylolisthesis, site unspecified: Secondary | ICD-10-CM | POA: Insufficient documentation

## 2019-10-30 DIAGNOSIS — G8929 Other chronic pain: Secondary | ICD-10-CM | POA: Insufficient documentation

## 2019-10-30 LAB — COVID-19 CORONAVIRUS DETECTION ASSAY AT ~~LOC~~ LAB: COVID-19 Coronavirus Result: NOT DETECTED

## 2019-10-30 NOTE — Telephone Encounter (Signed)
Please schedule this patient to be seen via mcvv asap, in the next 2 weeks

## 2019-10-31 ENCOUNTER — Encounter (HOSPITAL_BASED_OUTPATIENT_CLINIC_OR_DEPARTMENT_OTHER): Payer: Self-pay | Admitting: Pain Medicine

## 2019-10-31 ENCOUNTER — Ambulatory Visit: Payer: BC Managed Care – PPO | Attending: Pain Medicine | Admitting: Pain Medicine

## 2019-10-31 VITALS — Temp 97.8°F

## 2019-10-31 DIAGNOSIS — M47816 Spondylosis without myelopathy or radiculopathy, lumbar region: Secondary | ICD-10-CM

## 2019-10-31 NOTE — Progress Notes (Signed)
PM&R / PAIN NEW CONSULT NOTE    Referring Physician Trecia Rogers  Primary Care Physician Blima Ledger N    History of Illness:  This is a 39 year old female with a history of chronic low back pain s/p revision XLIF L4-5 and PSIF L4-5 on 01/03/19, who presents for evaluation for preoperative PM&R rehabilitation routine. This patient was seen for a pain consultation requested by Trecia Rogers. Patient was referred to pain clinic by anesthesia preoperative department as well as neurosurgery who saw patient for second opinion given persistent pain postoperatively. She follows regularly with ortho spine who performed surgery last year. She is planning to have revision fusion B1-4 with Dr. Lelon Huh on 7/82/95.     The patient reports low back and bilateral leg pain. Pain started a few years ago. Initially had lumbar spine surgery in [place]. States it was a L4-5 facet joint replacement. Then had revision surgery last April 2020. Describes pain as throbbing and burning pain in low back and legs. No particular distribution of pain in legs. Pain significantly limits her activity. States she was very active in the past. Now not able to do PT, walk long distances or exercise due to pain. Previously enjoyed playing volleyball, no longer able to do this. Pain and function initially improved after surgery last year briefly. Then 5-6 months postoperatively, pain significantly worsened.     Current Pain Medications:  Percocet 5/325mg  - 4 tabs per day  Tizanidine 4mg  - rarely, only with severe pain, feels sedated  Gabapentin 300mg  TID    Pain Medications Tried and Failed:  Lyrica  Norco  Flexeril 5mg  - feels sedated  Duloxetine  Valium - helpful    Current Outpatient Medications   Medication Sig Dispense Refill   . acetaminophen (TYLENOL) 325 MG tablet Take 3 tablets (975 mg) by mouth every 8 hours. 90 tablet 1   . ALPRAZolam (XANAX) 0.25 MG tablet take 1 tablet by mouth once daily if needed     . clindamycin (CLEOCIN T) 1 %  solution Apply 1 Application topically 2 times daily. Use a small amount as directed 1 bottle 2   . clindamycin (CLEOCIN) 300 MG capsule Take 1 capsule (300 mg) by mouth 3 times daily. 40 capsule 0   . cyclobenzaprine (FLEXERIL) 5 MG tablet Take 1 tablet (5 mg) by mouth 3 times daily as needed for Muscle Spasms. 30 tablet 0   . diazepam (VALIUM) 5 MG tablet Take 1 tablet (5 mg) by mouth every 6 hours as needed for Insomnia or Muscle Spasms. 30 tablet 0   . diclofenac (VOLTAREN) 1 % gel Apply 1 g topically 4 times daily. 1 Tube 3   . drospirenone-ethinyl estradiol (YAZ) 3-0.02 MG tablet Take 1 tablet by mouth daily. 84 tablet 3   . DULoxetine (CYMBALTA) 30 MG CR capsule Take 1 capsule (30 mg) by mouth 2 times daily. 180 capsule 1   . gabapentin (NEURONTIN) 300 MG capsule Take 300 mg by mouth 3 times daily.     Marland Kitchen HYDROcodone-acetaminophen (NORCO) 5-325 MG tablet Take 1 tablet by mouth every 6 hours as needed for Moderate Pain (Pain Score 4-6). 60 tablet 0   . ondansetron (ZOFRAN ODT) 4 MG disintegrating tablet Dissolve 1 tablet (4 mg) by mouth every 8 hours as needed for Nausea/Vomiting. 4 tablet 0   . oxyCODONE-acetaminophen (PERCOCET) 5-325 MG tablet Take 1 tablet by mouth every 6 hours as needed for Severe Pain (Pain Score 7-10).     Marland Kitchen  polyethylene glycol (GLYCOLAX) 17 GM/SCOOP powder 17 g by Oral route.     Marland Kitchen tiZANidine (ZANAFLEX) 4 MG tablet Take 1 tablet (4 mg) by mouth every 6 hours as needed (spasm). 50 tablet 0   . zolpidem (AMBIEN) 5 MG tablet Take 1 tablet (5 mg) by mouth nightly as needed for Insomnia. 30 tablet 0     No current facility-administered medications for this visit.        Patient is not currentlyon any anticoagulation medications    Allergies   Allergen Reactions   . Sulfa Drugs Nausea Only and Anxiety   . Tramadol Nausea and Vomiting         Pain Treatment History:  Other Pain Providers: yes - [facility] (Dr. Nedra Hai)    Physical Therapy: yes - after surgery in  April 2020, now unable to do PT due to pain.    Interventional pain procedures:  06/05/19 Bilateral SI joint injections  04/25/18 Bilateral L4-5 and L5-S1 facet injections and MBB - briefly helped then pain worsened    Non-interventional pain treatments:  Physical Therapy      Additional History:  Past Medical History:   Diagnosis Date   . ADD (attention deficit disorder)    . Clavicular fracture      Past Surgical History:   Procedure Laterality Date   . BACK SURGERY  07/28/2018    In [place]   . HUMERUS FRACTURE SURGERY Left 1993   . ------------OTHER-------------  39 yo    humerus ORIF      Social History     Socioeconomic History   . Marital status: Single     Spouse name: Not on file   . Number of children: Not on file   . Years of education: Not on file   . Highest education level: Not on file   Occupational History   . Not on file   Social Needs   . Financial resource strain: Not on file   . Food insecurity     Worry: Not on file     Inability: Not on file   . Transportation needs     Medical: Not on file     Non-medical: Not on file   Tobacco Use   . Smoking status: Never Smoker   . Smokeless tobacco: Never Used   Substance and Sexual Activity   . Alcohol use: Yes     Comment: socially, couple drinks/week    . Drug use: Yes     Types: Cannabinoid (CBD)     Comment: edibles   . Sexual activity: Not Currently     Partners: Male     Birth control/protection: Pill   Lifestyle   . Physical activity     Days per week: Not on file     Minutes per session: Not on file   . Stress: Not on file   Relationships   . Social Wellsite geologist on phone: Not on file     Gets together: Not on file     Attends religious service: Not on file     Active member of club or organization: Not on file     Attends meetings of clubs or organizations: Not on file     Relationship status: Not on file   . Intimate partner violence     Fear of current or ex partner: Not on file     Emotionally abused: Not on file  Physically abused:  Not on file     Forced sexual activity: Not on file   Other Topics Concern   . 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    Born in [place] and moved to [place] 6 years ago    Tour guide-food and history tours     Hobbies: beach volleyball, horse back riding, exercise        Additional Social History:   Currently working/school: no  Alcohol or substance abuse/use: no  Current exercise: no, limited due to pain  History of Depression/Anxiety/Mental Illness: yes - ADD    Family History   Problem Relation Name Age of Onset   . Diabetes Paternal Uncle     . Breast Cancer Paternal Aunt     . Breast Cancer Mother  55   . Breast Cancer Paternal Grandmother     . Colon Cancer Neg Hx     . Hypertension Neg Hx     . Cholesterol/Lipid Disorder Neg Hx         CURES    We ran the patient's name and date-of-birth through the [place] [employer] Prescription Drug Monitoring Program CURES website which will list controlled drug prescriptions filled at [facility] in the past 12 months. The information in the patient's CURES report is consistent with their self-report and shows no evidence of opioid abuse or doctor shopping.        Physical Exam:   Vitals: Temp 97.8 F (36.6 C) (Temporal)   General: healthy, alert, no distress  Mental Status:  alert, oriented. Speech is clear/ normal  Affect: euthymic  Skin: negative  HEENT:  Pupils equal, not pinpoint.  Pulmonary:  Breathing easily without tachypnea or bradypnea.  Cardiac:  No LE edema.  Abdomen: not distended  Ambulation: Pt is able to raise from a seated position without difficulty. Gait is not antalgic and the patient ambulates without assistance. Normal toe, heal and tandem.  Neuro/Musculoskeletal:   L-Spine  Flexion (normal 45):      full without pain  Extension (normal 25):     restricted and reproduces pain  Facet Palpation: Right: tender; Left:  tender  Facet Loading:  Right negative; Left negative  Straight Leg Raise: Right negative; Left negative    Palpation: No TTP over lumbar paraspinal muscles    Sacroiliac Joint   Fortin Finger Test: Right negative; Left negative  Palpation over PSIS: Right: non-tender; Left: non-tender  FABER/Patrick's Test (Flexion Abduction Exter Rot):  Right negative; Left negative    Lower-Extremities    Motor Strength                                     Left Right   Iliopsoas:  5/5 5/5  Quadriceps:  5/5 5/5  Hamstrings:    5/5 5/5  Ankle Dorsiflexion:   5/5 5/5  Ankle Plantarflexion:  5/5 5/5    Sensory Exam  Light touch: intact      Imaging  CT Lumbar Spine 10/07/19  IMPRESSION:  No acute fracture or dislocation of the lumbar spine.    Thin lucencies around both L4 screws, slightly more prominent than on prior study, suggesting micro motion. Posterior bone graft has not yet incorporated and is sparse. Anterior graft is incorporating, but not yet solidly  fused. No change in posterior fluid collection at the surgical bed. No change in 4 mm L4/L5 anterolisthesis.      Diagnosis  Encounter Diagnoses   Name Primary?   . Lumbar spondylosis Yes       Assessment  This is a 39 year old female with a history of chronic low back pain s/p revision XLIF L4-5 and PSIF L4-5 on 01/03/19, who presents for evaluation for preoperative PM&R rehabilitation routine. Describes low back and bilateral leg pain that has severely limited her daily activity. Exam with TTP over lumbar facet joints and pain reproduced on extension. She is planning to have revision fusion L4-5 on 11/02/19.Given anticipated surgery is in two days, preoperative rehabilitation is limited. Discussed with patient that she can follow up with [place] postoperatively as needed regarding rehabilitation and pain management needs.       PLAN    Interventions:   - No interventions at this time. Patient is planning to have revision fusion L4-5 on 11/02/19. To meet with Dr. Christa See tomorrow to  discuss final decision for proceeding with upcoming surgery.   - The pt has been educated regarding the risks (including bleeding, infection, increased pain, nerve damage, or allergic reaction), benefits, and alternatives. They state they understand and are eager to proceed.     Medication recommendations:   - No changes.   - [facility] is a consultation service with regard to medications and as such we do not take over the writing of routine prescriptions for patients. We are happy to make recommendations regarding pain medications for the patient's PCP to consider implementing.       Tests/Other:   - None.    We also recommend the patient start a low impact exercise program such as aqua therapy or recumbent bike as tolerated to improve cardiovascular function, core strength, and flexibility.     Follow-up: PRN     Thank you for the consultation, please call with any questions.

## 2019-10-31 NOTE — Progress Notes (Deleted)
PREOPERATIVE HISTORY & PHYSICAL EXAM      Referring Physician Self, Referred  Primary Care Physician Alyson Ingles N    History of Illness:  This is a 39 year old female here for preoperative exam prior to  Posterior lumbar decompression with instrumentation, possible fusion, lumbar 4-5 by Dr Zlomislic on 11/02/19.       Allergies   Allergen Reactions   . Sulfa Drugs Nausea Only and Anxiety   . Tramadol Nausea and Vomiting       Patient Active Problem List   Diagnosis   . ADD (attention deficit disorder)   . Birth control   . Finger dislocation, subsequent encounter   . Chronic bilateral low back pain, with sciatica presence unspecified   . Lumbar spondylosis   . Lumbar disc disease with radiculopathy   . Degenerative spondylolisthesis   . Spinal stenosis of lumbar region with radiculopathy   . Back pain, unspecified back location, unspecified back pain laterality, unspecified chronicity   . Chronic bilateral low back pain with bilateral sciatica   . Spinal stenosis of lumbar region, unspecified whether neurogenic claudication present   . S/P lumbar fusion   . Gastroesophageal reflux disease without esophagitis   . Spondylolisthesis of lumbar region   . Sacroiliac joint dysfunction of both sides   . Chronic bilateral low back pain without sciatica   . Chronic midline low back pain, unspecified whether sciatica present       Significant PMH/PSH:  Past Medical History:   Diagnosis Date   . ADD (attention deficit disorder)    . Clavicular fracture      Past Surgical History:   Procedure Laterality Date   . BACK SURGERY  07/28/2018    In [place]   . HUMERUS FRACTURE SURGERY Left 1993   . ------------OTHER-------------  39 yo    humerus ORIF      Social History     Socioeconomic History   . Marital status: Single     Spouse name: Not on file   . Number of children: Not on file   . Years of education: Not on file   . Highest education level: Not on file   Occupational History   . Not on file   Social Needs   . Financial  resource strain: Not on file   . Food insecurity     Worry: Not on file     Inability: Not on file   . Transportation needs     Medical: Not on file     Non-medical: Not on file   Tobacco Use   . Smoking status: Never Smoker   . Smokeless tobacco: Never Used   Substance and Sexual Activity   . Alcohol use: Yes     Comment: socially, couple drinks/week    . Drug use: Yes     Types: Cannabinoid (CBD)     Comment: edibles   . Sexual activity: Not Currently     Partners: Male     Birth control/protection: Pill   Lifestyle   . Physical activity     Days per week: Not on file     Minutes per session: Not on file   . Stress: Not on file   Relationships   . Social Wellsite geologist on phone: Not on file     Gets together: Not on file     Attends religious service: Not on file     Active member of club or organization:  Not on file     Attends meetings of clubs or organizations: Not on file     Relationship status: Not on file   . Intimate partner violence     Fear of current or ex partner: Not on file     Emotionally abused: Not on file     Physically abused: Not on file     Forced sexual activity: Not on file   Other Topics Concern   . 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    Born in [place] and moved to [place] 6 years ago    [place] and history tours     Hobbies: beach volleyball, horse back riding, exercise        Medications  Current Outpatient Medications   Medication Sig Dispense Refill   . acetaminophen (TYLENOL) 325 MG tablet Take 3 tablets (975 mg) by mouth every 8 hours. 90 tablet 1   . ALPRAZolam (XANAX) 0.25 MG tablet take 1 tablet by mouth once daily if needed     . clindamycin (CLEOCIN T) 1 % solution Apply 1 Application topically 2 times daily. Use a small amount as directed 1 bottle 2   . clindamycin (CLEOCIN) 300 MG capsule Take 1 capsule (300 mg) by mouth 3 times daily. 40 capsule 0   .  cyclobenzaprine (FLEXERIL) 5 MG tablet Take 1 tablet (5 mg) by mouth 3 times daily as needed for Muscle Spasms. 30 tablet 0   . diazepam (VALIUM) 5 MG tablet Take 1 tablet (5 mg) by mouth every 6 hours as needed for Insomnia or Muscle Spasms. 30 tablet 0   . diclofenac (VOLTAREN) 1 % gel Apply 1 g topically 4 times daily. 1 Tube 3   . drospirenone-ethinyl estradiol (YAZ) 3-0.02 MG tablet Take 1 tablet by mouth daily. 84 tablet 3   . DULoxetine (CYMBALTA) 30 MG CR capsule Take 1 capsule (30 mg) by mouth 2 times daily. 180 capsule 1   . gabapentin (NEURONTIN) 300 MG capsule Take 300 mg by mouth 3 times daily.     Marland Kitchen HYDROcodone-acetaminophen (NORCO) 5-325 MG tablet Take 1 tablet by mouth every 6 hours as needed for Moderate Pain (Pain Score 4-6). 60 tablet 0   . ondansetron (ZOFRAN ODT) 4 MG disintegrating tablet Dissolve 1 tablet (4 mg) by mouth every 8 hours as needed for Nausea/Vomiting. 4 tablet 0   . oxyCODONE-acetaminophen (PERCOCET) 5-325 MG tablet Take 1 tablet by mouth every 6 hours as needed for Severe Pain (Pain Score 7-10).     . polyethylene glycol (GLYCOLAX) 17 GM/SCOOP powder 17 g by Oral route.     Marland Kitchen tiZANidine (ZANAFLEX) 4 MG tablet Take 1 tablet (4 mg) by mouth every 6 hours as needed (spasm). 50 tablet 0   . zolpidem (AMBIEN) 5 MG tablet Take 1 tablet (5 mg) by mouth nightly as needed for Insomnia. 30 tablet 0     No current facility-administered medications for this visit.        Patient {ON ANTICOG RWER:15400}    Review of Systems:  General:  Negative for fevers, chills or night sweats.  Skin: Negative for rashes, sores or infection.  Eyes: Negative for visual changes, diplopia or blurry vision.  Ears/Nose/Throat/Mouth: Negative for dental infection or problems.  Negative for sore throat or congestion.   Respiratory: Negative for cough, sputum production, wheezing, sleep  apnea.  Cardiovascular: Negative for chest pain, palpitations, syncope, orthopnea or pnd.  Gastrointestinal: Negative for nausea,  vomiting, diarrhea, melena or hematochezia.    Genitourinary: Negative for dysuria, frequency, hesitancy or nocturia.  Musculoskeletal: See HPI  Neurologic: Negative for history of seizures.  Psychiatric: negative  Hematologic/Lymphatic/Immunologic: Negative for anemia or bleeding problems.  Negative for DVT or PE history.  Endocrine: negative    Physical Exam:     Vitals: There were no vitals taken for this visit.    General: {GENERAL APPEARANCE:50}  HEENT:  Pupils equal, not pinpoint.  Cardiac:  {Cardiac Exam:14559}  Pulmonary:{LUNGS BRIEF EXAM:404}  Abdomen: nondistended  Neuro:{neuro:5902::"normal without focal findings","mental status, speech normal, alert and oriented x iii","PERLA","reflexes normal and symmetric"}  Skin: {SKIN EXAM:120131}  Musculoskeletal:  Pt ambulatory? {YES/NO:11202}    Labs/Tests/Imaging     Per anesthesia      Diagnosis  No diagnosis found.    Assessment  Haley Barrett is a 39 year old female with history as stated above presenting for preoperative evaluation prior to Posterior lumbar decompression with instrumentation, possible fusion, lumbar 4-5 by Dr Zlomislic on 11/02/19.        PLAN   Anesthesia pre-op:  10/26/19   Pt has covid testing scheduled.  10/30/19   PCP clearance ***   Final clearance is made by anesthesia team on day of surgery   Per ortho department protocol, bactroban nasal prescribed twice daily for 5 days prior to surgery as MRSA prophylaxis   CHG given   Pt advised:   Nothing to eat or drink after midnight the night prior to surgery.   Take their medications as prescribed unless otherwise directed by anesthesia team   Hold any aspirin (unless advised otherwise by anesthesia), ibuprofen, aleve, naprosyn, celebrex, and other nonsteroidal anti-inflammatory drugs, fish oil, vitamins and supplements for 7 days prior to surgery.  If no liver disease, it is OK to take acetaminophen, (Tylenol) as needed.

## 2019-10-31 NOTE — Telephone Encounter (Signed)
From: Haley Barrett  To: [name], MD  Sent: 10/28/2019 36:62 PM PST  Subject: 20-Other    Also just wanted to mention that was not my only question for Doctor Z before the surgery. I didn't get the chance to ask him last time if he had advice on the pros and cons of another level fusion for me if he doesn't see anything else wrong in my spine if he would be able to make this decision while in there or not, or if he thought the exploration and metal plan was still the best choice for me at this juncture. It is my third surgery and I would like to go in more confident that we have a plan that will solve this for me. I really need to be able to discuss this with Doctor Z before surgery, so could you let me know if it is possible to do that so that I don't have to reschedule (which I don't want to do as the pain is excruciating). I had thought I had another appointment with him before surgery after the new information last time and then when it got moved up forgot that I would need that first. It's very important to me to have a conversation about this before surgery as I've   been in a lot of pain now for the last few months and want to feel more confident of the plan going forward.    Thanks,  [name]

## 2019-10-31 NOTE — Patient Instructions (Signed)
Marland KitchenMarland KitchenMarland KitchenMarland Kitchen....PREOPERATIVE SURGICAL INFORMATION    Your surgery is currently scheduled at [facility], [facility], on 11/02/19  With a planned report time of 2:00pm (this time may change, the surgery center will call or send mychart message the day before if this time needs to be changed)      FACILITY ADDRESS   [facility], [facility], [place], [address] [place], [place] [address]   Please check in at Main Admissions on the 1st floor    QUESTIONS   If you have any questions between now and the day of your surgery, please do not hesitate to call your surgeon    DAY OF SURGERY ARRIVAL TIME:  On the day of your Surgery/Procedure, please arrive at the time provided by the surgery/preop team.  If you have any questions regarding your arrival time, please call:   Preoperative Surgical Admissions at [facility]: [phone]    MEDICATION INSTRUCTIONS:     MEDICATIONS TO STOP 7 DAYS BEFORE SURGERY/PROCEDURE:   PLEASE HOLD ASPIRIN AND ALL NSAIDS (non-steroidal anti-inflammatory drugs) SUCH AS advil, aleve, motrin, ibuprofen, relafen, lodine, feldene, Diclofenac, voltaren, indomethacin, naproxen, celebrex, Mobic.    Please hold vitamins, supplements, herbs & fish oil.   If you do not have liver issues, Tylenol (acetaminophen) is okay    REGULAR PRESCRIPTION MEDICATIONS:   Regular prescription medications should be taken the day of surgery with sips of water.   AFTER YOUR VISIT WITH [place], IF YOU START TAKING A NEW MEDICATION BEFORE SURGERY, PLEASE CALL us TO MAKE SURE IT IS SAFE TO TAKE & WON'T EFFECT YOUR SURGERY.    TO DO LIST:   Bring Cpap supplies with you on day of surgery if you have sleep apnea         EATING/DRINKING   PLEASE DO NOT EAT OR DRINK ANYTHING AFTER MIDNIGHT THE NIGHT BEFORE SURGERY.      Preparing for your Surgery:   Please wear clean loose-fitting clothes and leave valuables at home   Bring a picture ID and your insurance card, and be prepared to pay your deductible or  co-insurance by cash, check, or credit card when you arrive.   If you are going home after your surgery, please make sure to arrange for an adult to drive you home.  If you do not do so, your surgery may be cancelled.      If you are a woman of child bearing age, please note that you may be asked to give a urine sample upon checkin.    On The Day of Your Surgery:    Check in at the location mentioned above   You will meet your anesthesia and surgery teams before surgery.   Once surgery is over, you will wake up in the recovery room where you will be able to see your friends/family.   Once your time in the recovery room is complete, you will either go home or be admitted as planned.   If you go home, someone will need to stay with you for the first 24 hours after surgery.     Additional information about what to expect before & after surgery is available online at:  [email or web address]    Or by searching "You-tube" for Corn before surgery and Index after surgery    You medical records are available to you at [email or web address]  Select create account.  ------------------------------------------------------------------     Preparing for your surgery    Shower with Chlorhexidine (  CHG) soap to prevent infection    Instructions:   You should shower with CHG soap a minimum of three times before your surgery, or more often as directed by your surgeon.    Showering several times before surgery blocks germ growth and provides the best protection when used at least 3 times in a row.                      3 =                    +                  +               At least 3 showers       the morning the night the morning of   before surgery before surgery before surgery admission to surgery               Date:_______       Date:_______            Date:_______    How to shower with CHG Soap:  1. Rinse your body with warm water.  2. Wash your hair with regular shampoo.  Rinse your hair with water. If you are having neck surgery, use CHG soap instead of your regular shampoo to wash your hair. Rinse your hair with water.  3. Wet a clean sponge. Turn off the water. Apply CHG liberally.  4. Firmly massage all areas: neck, arms, chest, back, abdomen, hips, groin, genitals (external only) and buttocks. Clean your legs and feet and between your fingers and toes. Pay special attention to the site of your surgery and all surrounding skin. Ask for help to clean your back if you have a spinal surgery.   5. Lather again before rinsing.  6. Turn on the water and rinse CHG off your body.  7. Dry off with a clean towel.  8. Don't apply lotions or powders.   9. Use clean clothes and freshly laundered bed linens.          Repeat steps 1- 9 each time you shower.      Caution: When using CHG soap, avoid contact with eyes, nose, ear canals and mouth.      Important reminders:  . Do not use any other soaps or body wash when using CHG. Other soaps can block the CHG benefits.  . After showering, do not apply lotion, cream, powder, deodorant, or hair conditioner.  . Do not shave or remove body hair. Facial shaving is permitted. If you are having head surgery, ask your doctor whether you can shave.  . CHG is safe to use on minor wounds, rashes, burns, and over staples and stiches.  . Allergic reactions are rare but may occur. If you have an allergic reaction, stop using CHG and call your doctor if you have a skin irritation.  . If you are allergic to CHG, please follow the bathing instructions above using an over-the-counter regular soap instead of CHG.                          ----------------------------------------------------------------  MRSA DECOLONIZATION PROCESS  1. Nasal Ointment twice a day for 5 days leading up to date of surgery  2. Shower or Bathe with chlorhexidine soap (Hibiclens)  3. Wash bed sheets and pajamas at the start of decolonization  process then wash everything,  clothes/sheets/pajamas/pillow cases 1&2 days before.      How to use the nasal ointment (mupirocin 2%)   Your ointment requires a prescription and will come in several small, single-use tubes or in one larger tube.   . If you have the small tubes, you should use half of a tube inside each nostril each time you apply the ointment. Throw away the small tube and use a new one next time.   . If you have the large tube, you should use a pea-sized amount of ointment inside each nostril each time you apply the ointment. Save the large tube and use it for all your doses.   1. Clean your hands using a sanitizer gel or wash with soap and water for 15 to 20 seconds just before using your ointment.   2. Tilt your head back and use a cotton swab to apply the ointment to the inside of each nostril.   3. Press your nostrils together and massage for about 1 minute.   4. Don't get the ointment near your eyes.   . If any of it gets into your eyes, rinse them well with cool water.   5. Apply the nasal ointment twice a day for 5 days unless otherwise directed by your doctor.   6. Clean your hands using a sanitizer gel or wash with soap and water (for 15 to 20 seconds) as soon as you are finished.   Do not use any topical medicines or inside the nose medicines (such as nasal sprays) during the 5 days you are using the ointment.       How to use the soap (4% chlorhexidine) (Hibiclens)   Your soap will come in either a bottle or in packets.   1. If you have packets, use two packets for each application in the shower or bath. If you have the bottle, use about 2 tablespoons of soap for each application in the shower or bath.   2. First, shampoo and rinse your hair with your usual shampoo. This is done first so the Hibiclens soap isn't washed off by your shampoo.   3. Using a clean washcloth, apply the Hibiclens to all areas, avoiding your face. Keep out of your eyes, ears and mouth. Make sure to wash your armpits, behind your ears and your  knees, your groin area, and between any skin folds. The soap will not bubble or lather very much, and that is fine.   . If you get the soap in your eyes, ears or mouth - rinse well with cool water.   4. When you've covered your whole body with the soap, do not rinse, but replenish the Hibiclens on your washcloth and repeat step 3.   5. When finished, leave the soap on your skin for 2 minutes.   6. Rinse the Hibiclens off your skin thoroughly.   Do not wash with any other soap or cleanser.   7. Dry off with a clean towel and put on clean clothing.   8. Using lotion for dry skin is OK, but do not use lotion if you are having a surgical procedure.

## 2019-11-01 ENCOUNTER — Telehealth (HOSPITAL_BASED_OUTPATIENT_CLINIC_OR_DEPARTMENT_OTHER): Payer: Self-pay | Admitting: Orthopaedic Surgery of the Spine

## 2019-11-01 ENCOUNTER — Ambulatory Visit: Payer: BC Managed Care – PPO | Admitting: Physician Assistant

## 2019-11-01 ENCOUNTER — Encounter (HOSPITAL_BASED_OUTPATIENT_CLINIC_OR_DEPARTMENT_OTHER): Payer: Self-pay | Admitting: Physician Assistant

## 2019-11-01 ENCOUNTER — Other Ambulatory Visit (HOSPITAL_BASED_OUTPATIENT_CLINIC_OR_DEPARTMENT_OTHER): Payer: Self-pay | Admitting: Orthopaedic Surgery of the Spine

## 2019-11-01 ENCOUNTER — Encounter (INDEPENDENT_AMBULATORY_CARE_PROVIDER_SITE_OTHER): Payer: Self-pay

## 2019-11-01 ENCOUNTER — Telehealth (INDEPENDENT_AMBULATORY_CARE_PROVIDER_SITE_OTHER): Payer: BC Managed Care – PPO | Admitting: Orthopaedic Surgery of the Spine

## 2019-11-01 ENCOUNTER — Telehealth (INDEPENDENT_AMBULATORY_CARE_PROVIDER_SITE_OTHER): Payer: Self-pay

## 2019-11-01 ENCOUNTER — Ambulatory Visit (HOSPITAL_BASED_OUTPATIENT_CLINIC_OR_DEPARTMENT_OTHER): Payer: BC Managed Care – PPO | Admitting: Orthopedics

## 2019-11-01 DIAGNOSIS — Z01818 Encounter for other preprocedural examination: Secondary | ICD-10-CM

## 2019-11-01 DIAGNOSIS — R52 Pain, unspecified: Secondary | ICD-10-CM

## 2019-11-01 NOTE — H&P (Cosign Needed)
---------------------(data below generated by Caprice Renshaw, PA)--------------------     Patient Verification & Telemedicine Consent & Financial Waiver:    1.   Identity: I have verified this patient's identity to be accurate.  2.   Consent: I verify consent has been secured in one of the following methods: (a) obtained written/ online attestation consent (via MyChartVideoVisit pathway), (b) the spoke-side provider has obtained verbal or written consent from patient/surrogate (if this is a "provider to provider" evaluation), or (c) in all other cases, I have personally obtained verbal consent from the patient/ surrogate (noting all elements below) to perform this voluntary telemedicine evaluation (including obtaining history, performing examination and reviewing data provided by the patient).   The patient/ surrogate has the right to refuse this evaluation.  I have explained risks (including potential loss of confidentiality), benefits, alternatives, and the potential need for subsequent face to face care. Patient/ surrogate understands that there is a risk of medical inaccuracies given that our recommendations will be made based on reported data (and we must therefore assume this information is accurate).  Knowing that there is a risk that this information is not reported accurately, and that the telemedicine video, audio, or data feed may be incomplete, the patient agrees to proceed with evaluation and holds [place] harmless knowing these risks.  3.   Healthcare Team: The patient/ surrogate has been notified that other healthcare professionals (including students, residents and Metallurgist) may be involved in this audio-video evaluation.   All laws concerning confidentiality and patient access to medical records and copies of medical records apply to telemedicine.  4.   Privacy: If this is a Radiographer, therapeutic Visit, the patient/ surrogate has received the [place] Notice of Privacy Practices via E-Checkin process.   For all other video visit techniques, I have verbally provided the patient/ surrogate with the [place] in [place] ([email or web address]) or Spanish ([email or web address]).  The patient/ surrogate acknowledges both being provided the NPP link, and has been offered to have the NPP mailed to the patient/ surrogate by [place] mail.  The patient/ surrogate has voiced understanding an acknowledgement of receipt of this NPP web address.  If the patient/surrogate has elected to receive the NPP via [place] mail, I verify that the NPP will be sent promptly to the patient/surrogate via [place] mail.  5.   Capacity: I have reviewed this above verification and consent paragraph with the patient/ surrogate and the patient is capacitated or has a surrogate. If the patient is not capacitated to understand the above, and no surrogate is available, since this is not an emergency evaluation, the visit will be rescheduled until such time that the patient can consent, or the surrogate is available to consent. If this is an emergency evaluation and the patient is not capacitated to understand the above, and no surrogate is available, I am proceeding with this evaluation as this is felt to be an emergency setting and no appropriate specialist is available at the bedside to perform these evaluations.  6.   Financial Waiver: If this is a Radiographer, therapeutic Visit, the patient has been made aware of the financial waiver via E-Checkin process.  For all other video visit techniques, an E-Checkin process is not performed.  As such, I have personally verbally informed the patient/ surrogate that this evaluation will be a billable encounter similar to an in-person clinic visit, and the patient/ surrogate has agreed to pay the fee for services rendered.  If we are billing insurance for the  patient's telehealth visit, her out-of-pocket cost will be determined based on her plan and will be billed to her.  The  patient/ surrogate has also been informed that if the patient does not have insurance or does not wish to use insurance, [place] [address] [employer] price for a primary care telehealth visit is $59.00 and specialist telehealth visit is $88.00.  I have further informed the patient/ surrogate that in the event the patient has additional services provided in conjunction with the specialty visit (Ex. Psychotherapy services), those services will be billed at the current rate less a 45% discount.  7.   Intra-State Location: The patient/ surrogate attests to understanding that if the patient accesses these services from a location outside of [place], that the patient does so at the patient's own risk and initiative and that the patient is ultimately responsible for compliance with any laws or regulations associated with the patient's use.  8.   Specific Use:The patient/ surrogate understands that [facility] makes no representation that materials or servicesdelivered via telecommunication services, or listed on telemedicine websites, are appropriate or available for use in any other location.           Demographics:  Medical Record #: [id number]  Date: November 01, 2019  Patient Name: Haley Barrett  DOB: 07/03/1981  Age: 39 year old  Sex: female  Location: Home address on file     Evaluator(s):  Jonathon Castelo was evaluated by me today.    Clinic Location: [facility]   [facility]  [address]  [place] [address]       PREOPERATIVE HISTORY & PHYSICAL EXAM      Referring Physician Self, Referred  Primary Care Physician Alyson Ingles N    History of Illness:  This is a 39 year old female here for preoperative exam prior to  Posterior lumbar decompression with instrumentation, possible fusion, lumbar 4-5 by Dr Zlomislic on 11/02/19.       Allergies   Allergen Reactions   . Sulfa Drugs Nausea Only and Anxiety   . Tramadol Nausea and Vomiting       Patient Active Problem List   Diagnosis   . ADD (attention  deficit disorder)   . Birth control   . Finger dislocation, subsequent encounter   . Chronic bilateral low back pain, with sciatica presence unspecified   . Lumbar spondylosis   . Lumbar disc disease with radiculopathy   . Degenerative spondylolisthesis   . Spinal stenosis of lumbar region with radiculopathy   . Back pain, unspecified back location, unspecified back pain laterality, unspecified chronicity   . Chronic bilateral low back pain with bilateral sciatica   . Spinal stenosis of lumbar region, unspecified whether neurogenic claudication present   . S/P lumbar fusion   . Gastroesophageal reflux disease without esophagitis   . Spondylolisthesis of lumbar region   . Sacroiliac joint dysfunction of both sides   . Chronic bilateral low back pain without sciatica   . Chronic midline low back pain, unspecified whether sciatica present       Significant PMH/PSH:  Past Medical History:   Diagnosis Date   . ADD (attention deficit disorder)    . Clavicular fracture      Past Surgical History:   Procedure Laterality Date   . BACK SURGERY  07/28/2018    In [place]   . HUMERUS FRACTURE SURGERY Left 1993   . ------------OTHER-------------  39 yo    humerus ORIF    .  SPINE SURGERY  July 28, 2018, April 2020     Social History     Socioeconomic History   . Marital status: Single     Spouse name: Not on file   . Number of children: Not on file   . Years of education: Not on file   . Highest education level: Not on file   Occupational History   . Not on file   Social Needs   . Financial resource strain: Not on file   . Food insecurity     Worry: Not on file     Inability: Not on file   . Transportation needs     Medical: Not on file     Non-medical: Not on file   Tobacco Use   . Smoking status: Never Smoker   . Smokeless tobacco: Never Used   Substance and Sexual Activity   . Alcohol use: Yes     Alcohol/week: 4.0 standard drinks     Types: 4 Glasses of wine per week     Comment: socially, couple drinks/week    . Drug use:  Yes     Types: Cannabinoid (CBD)     Comment: edibles   . Sexual activity: Not Currently     Partners: Male     Birth control/protection: Pill, Abstinence   Lifestyle   . Physical activity     Days per week: Not on file     Minutes per session: Not on file   . Stress: Not on file   Relationships   . Social Product manager on phone: Not on file     Gets together: Not on file     Attends religious service: Not on file     Active member of club or organization: Not on file     Attends meetings of clubs or organizations: Not on file     Relationship status: Not on file   . Intimate partner violence     Fear of current or ex partner: Not on file     Emotionally abused: Not on file     Physically abused: Not on file     Forced sexual activity: Not on file   Other Topics Concern   . Military Service No   . Blood Transfusions No   . Occupational Exposure No   . Seat Belt Use Yes   Social History Narrative    Single    Psychotherapist - just beginning, in Biomedical engineer. Went to school in [place].    Born in [place] and moved to [place] 6 years ago    [place] and history tours     Hobbies: beach volleyball, horse back riding, exercise        Medications  Current Outpatient Medications   Medication Sig Dispense Refill   . acetaminophen (TYLENOL) 325 MG tablet Take 3 tablets (975 mg) by mouth every 8 hours. 90 tablet 1   . ALPRAZolam (XANAX) 0.25 MG tablet take 1 tablet by mouth once daily if needed     . clindamycin (CLEOCIN T) 1 % solution Apply 1 Application topically 2 times daily. Use a small amount as directed 1 bottle 2   . clindamycin (CLEOCIN) 300 MG capsule Take 1 capsule (300 mg) by mouth 3 times daily. 40 capsule 0   . cyclobenzaprine (FLEXERIL) 5 MG tablet Take 1 tablet (5 mg) by mouth 3 times daily as needed for Muscle Spasms. 30 tablet 0   .  diazepam (VALIUM) 5 MG tablet Take 1 tablet (5 mg) by mouth every 6 hours as needed for Insomnia or Muscle Spasms. 30 tablet 0   . diclofenac  (VOLTAREN) 1 % gel Apply 1 g topically 4 times daily. 1 Tube 3   . drospirenone-ethinyl estradiol (YAZ) 3-0.02 MG tablet Take 1 tablet by mouth daily. 84 tablet 3   . DULoxetine (CYMBALTA) 30 MG CR capsule Take 1 capsule (30 mg) by mouth 2 times daily. 180 capsule 1   . gabapentin (NEURONTIN) 300 MG capsule Take 300 mg by mouth 3 times daily.     Marland Kitchen HYDROcodone-acetaminophen (NORCO) 5-325 MG tablet Take 1 tablet by mouth every 6 hours as needed for Moderate Pain (Pain Score 4-6). 60 tablet 0   . ondansetron (ZOFRAN ODT) 4 MG disintegrating tablet Dissolve 1 tablet (4 mg) by mouth every 8 hours as needed for Nausea/Vomiting. 4 tablet 0   . oxyCODONE-acetaminophen (PERCOCET) 5-325 MG tablet Take 1 tablet by mouth every 6 hours as needed for Severe Pain (Pain Score 7-10).     . polyethylene glycol (GLYCOLAX) 17 GM/SCOOP powder 17 g by Oral route.     Marland Kitchen tiZANidine (ZANAFLEX) 4 MG tablet Take 1 tablet (4 mg) by mouth every 6 hours as needed (spasm). 50 tablet 0   . zolpidem (AMBIEN) 5 MG tablet Take 1 tablet (5 mg) by mouth nightly as needed for Insomnia. 30 tablet 0     No current facility-administered medications for this visit.        Patient is not currentlyon any anticoagulation medications    Review of Systems:  General:  Negative for fevers, chills or night sweats.  Skin: Negative for rashes, sores or infection.  Eyes: Negative for visual changes, diplopia or blurry vision.  Ears/Nose/Throat/Mouth: Negative for dental infection or problems.  Negative for sore throat or congestion.   Respiratory: Negative for cough, sputum production, wheezing, sleep apnea.  Cardiovascular: Negative for chest pain, palpitations, syncope, orthopnea or pnd.  Gastrointestinal: Negative for nausea, vomiting, diarrhea, melena or hematochezia.    Genitourinary: Negative for dysuria, frequency, hesitancy or nocturia.  Musculoskeletal: See HPI  Neurologic: Negative for history of seizures.  Psychiatric:  negative  Hematologic/Lymphatic/Immunologic: Negative for anemia or bleeding problems.  Negative for DVT or PE history.  Endocrine: negative    Physical Exam:     Vitals: There were no vitals taken for this visit.    PHYSICAL EXAM:   VITALS: There were no vitals taken for this visit., There is no height or weight on file to calculate BMI.   GENERAL: A+Ox3. INAD. Well appearing and well groomed.   MENTAL STATUS: Pleasant and cooperative. Alert and oriented x3 with normal mood and affect.   Vascular: Extremities appear grossly well perfused   Skin: No rashes/wounds/lesions/ulcers. Skin appears dry.   Respiratory: No respiratory distress   Cardiac: Equal chest expansion with respiration.  No cyanosis.       Labs/Tests/Imaging     Per anesthesia      Diagnosis  Encounter Diagnoses   Name Primary?   . Pre-op exam Yes       Assessment  Haley Barrett is a 39 year old female with history as stated above presenting for preoperative evaluation prior to Posterior lumbar decompression with instrumentation, possible fusion, lumbar 4-5 by Dr Zlomislic on 11/02/19.        PLAN   Anesthesia pre-op:  10/26/19   Pt has covid testing scheduled.  10/30/19   Final clearance is  made by anesthesia team on day of surgery   Per ortho department protocol, bactroban nasal prescribed twice daily for 5 days prior to surgery as MRSA prophylaxis   CHG given   Pt advised:   Nothing to eat or drink after midnight the night prior to surgery.   Take their medications as prescribed unless otherwise directed by anesthesia team   Hold any aspirin (unless advised otherwise by anesthesia), ibuprofen, aleve, naprosyn, celebrex, and other nonsteroidal anti-inflammatory drugs, fish oil, vitamins and supplements for 7 days prior to surgery.  If no liver disease, it is OK to take acetaminophen, (Tylenol) as needed.    Talitha Givens, PA-C  Orthopaedic Surgery Physician Assistant  Supervising Physician - R Shyrl Numbers, MD, PhD

## 2019-11-01 NOTE — Telephone Encounter (Signed)
Patient having surgery on Thursday she states there's a discrepancy in the time. Requesting a call back from surgery scheduler to confirm time.

## 2019-11-01 NOTE — Telephone Encounter (Signed)
Returned patient call. Matter resolved.

## 2019-11-01 NOTE — Telephone Encounter (Signed)
Left msg on pt vm auth for sx tomorrow still not received. Peer to peer was supposed to happen with Dr. Herma Barrett and peer reviewer at [facility] today at 5p. No call was received by Dr. Herma Barrett. Haley Barrett is still pending. In msg informed pt i will call her back tomorrow morning with auth status because her sx is after checkin. Will check auth status in the AM.

## 2019-11-02 ENCOUNTER — Encounter (HOSPITAL_COMMUNITY)
Admission: RE | Disposition: A | Discharge status: Home Health | Payer: Self-pay | Attending: Orthopaedic Surgery of the Spine

## 2019-11-02 ENCOUNTER — Inpatient Hospital Stay (HOSPITAL_COMMUNITY): Payer: BC Managed Care – PPO | Admitting: General Surgery

## 2019-11-02 ENCOUNTER — Inpatient Hospital Stay
Admission: RE | Admit: 2019-11-02 | Discharge: 2019-11-04 | DRG: 460 | Disposition: A | Discharge status: Home Health | Payer: BC Managed Care – PPO | Attending: Orthopaedic Surgery of the Spine | Admitting: Orthopaedic Surgery of the Spine

## 2019-11-02 ENCOUNTER — Inpatient Hospital Stay (HOSPITAL_COMMUNITY): Payer: BC Managed Care – PPO | Admitting: Certified Registered Nurse Anesthetist

## 2019-11-02 ENCOUNTER — Inpatient Hospital Stay (HOSPITAL_BASED_OUTPATIENT_CLINIC_OR_DEPARTMENT_OTHER): Payer: BC Managed Care – PPO

## 2019-11-02 DIAGNOSIS — Z885 Allergy status to narcotic agent status: Secondary | ICD-10-CM

## 2019-11-02 DIAGNOSIS — M4727 Other spondylosis with radiculopathy, lumbosacral region: Secondary | ICD-10-CM | POA: Diagnosis present

## 2019-11-02 DIAGNOSIS — M47817 Spondylosis without myelopathy or radiculopathy, lumbosacral region: Secondary | ICD-10-CM

## 2019-11-02 DIAGNOSIS — Z79899 Other long term (current) drug therapy: Secondary | ICD-10-CM

## 2019-11-02 DIAGNOSIS — Z981 Arthrodesis status: Secondary | ICD-10-CM

## 2019-11-02 DIAGNOSIS — Z9119 Patient's noncompliance with other medical treatment and regimen: Secondary | ICD-10-CM

## 2019-11-02 DIAGNOSIS — M5136 Other intervertebral disc degeneration, lumbar region: Secondary | ICD-10-CM

## 2019-11-02 DIAGNOSIS — K219 Gastro-esophageal reflux disease without esophagitis: Secondary | ICD-10-CM | POA: Diagnosis present

## 2019-11-02 DIAGNOSIS — G061 Intraspinal abscess and granuloma: Secondary | ICD-10-CM

## 2019-11-02 DIAGNOSIS — M48061 Spinal stenosis, lumbar region without neurogenic claudication: Secondary | ICD-10-CM | POA: Diagnosis present

## 2019-11-02 DIAGNOSIS — D62 Acute posthemorrhagic anemia: Secondary | ICD-10-CM | POA: Diagnosis not present

## 2019-11-02 DIAGNOSIS — M545 Low back pain, unspecified: Secondary | ICD-10-CM

## 2019-11-02 DIAGNOSIS — M4310 Spondylolisthesis, site unspecified: Secondary | ICD-10-CM

## 2019-11-02 DIAGNOSIS — Y792 Prosthetic and other implants, materials and accessory orthopedic devices associated with adverse incidents: Secondary | ICD-10-CM | POA: Diagnosis present

## 2019-11-02 DIAGNOSIS — G8929 Other chronic pain: Secondary | ICD-10-CM

## 2019-11-02 DIAGNOSIS — Z2821 Immunization not carried out because of patient refusal: Secondary | ICD-10-CM

## 2019-11-02 DIAGNOSIS — F419 Anxiety disorder, unspecified: Secondary | ICD-10-CM | POA: Diagnosis present

## 2019-11-02 DIAGNOSIS — G9764 Postprocedural seroma of a nervous system organ or structure following other procedure: Secondary | ICD-10-CM | POA: Diagnosis present

## 2019-11-02 DIAGNOSIS — R52 Pain, unspecified: Secondary | ICD-10-CM

## 2019-11-02 DIAGNOSIS — M5116 Intervertebral disc disorders with radiculopathy, lumbar region: Principal | ICD-10-CM | POA: Diagnosis present

## 2019-11-02 DIAGNOSIS — Z882 Allergy status to sulfonamides status: Secondary | ICD-10-CM

## 2019-11-02 DIAGNOSIS — M4316 Spondylolisthesis, lumbar region: Secondary | ICD-10-CM | POA: Diagnosis present

## 2019-11-02 DIAGNOSIS — Z789 Other specified health status: Secondary | ICD-10-CM

## 2019-11-02 DIAGNOSIS — R262 Difficulty in walking, not elsewhere classified: Secondary | ICD-10-CM

## 2019-11-02 DIAGNOSIS — Z8781 Personal history of (healed) traumatic fracture: Secondary | ICD-10-CM

## 2019-11-02 DIAGNOSIS — Z888 Allergy status to other drugs, medicaments and biological substances status: Secondary | ICD-10-CM

## 2019-11-02 DIAGNOSIS — F988 Other specified behavioral and emotional disorders with onset usually occurring in childhood and adolescence: Secondary | ICD-10-CM | POA: Diagnosis present

## 2019-11-02 SURGERY — LAMINECTOMY, SPINE, LUMBAR, 1 LEVEL, WITH FUSION USING INSTRUMENTATION, POSTERIOR APPROACH
Anesthesia: General | Site: Spine Lumbar | Wound class: Class I (Clean)

## 2019-11-02 MED ORDER — ONDANSETRON HCL 4 MG/2ML IV SOLN
INTRAMUSCULAR | Status: DC | PRN
Start: 2019-11-02 — End: 2019-11-03
  Administered 2019-11-02: 4 mg via INTRAVENOUS

## 2019-11-02 MED ORDER — LIDOCAINE HCL 20 MG/ML IV INJECTION WRAPPED RECORD
INTRAVENOUS | Status: DC | PRN
Start: 2019-11-02 — End: 2019-11-03
  Administered 2019-11-02: 80 mg via INTRAVENOUS

## 2019-11-02 MED ORDER — ROCURONIUM BROMIDE 100 MG/10ML IV SOLN
INTRAVENOUS | Status: DC | PRN
Start: 2019-11-02 — End: 2019-11-03
  Administered 2019-11-02: 22:00:00 50 mg via INTRAVENOUS

## 2019-11-02 MED ORDER — MIDAZOLAM HCL 2 MG/2ML IJ SOLN
INTRAMUSCULAR | Status: AC
Start: 2019-11-02 — End: 2019-11-02
  Filled 2019-11-02: qty 2

## 2019-11-02 MED ORDER — THROMBIN (RECOMBINANT) 20000 UNIT EX SOLR
CUTANEOUS | Status: AC
Start: 2019-11-02 — End: 2019-11-02
  Filled 2019-11-02: qty 1

## 2019-11-02 MED ORDER — LACTATED RINGERS IV SOLN
INTRAVENOUS | Status: DC | PRN
Start: 2019-11-02 — End: 2019-11-03

## 2019-11-02 MED ORDER — MIDAZOLAM HCL 2 MG/2ML IJ SOLN
INTRAMUSCULAR | Status: DC | PRN
Start: 2019-11-02 — End: 2019-11-03
  Administered 2019-11-02: 2 mg via INTRAVENOUS

## 2019-11-02 MED ORDER — LIDOCAINE-EPINEPHRINE 1 %-1:100000 IJ SOLN
INTRAMUSCULAR | Status: DC | PRN
Start: 2019-11-02 — End: 2019-11-03
  Administered 2019-11-02: 10 mL

## 2019-11-02 MED ORDER — LIDOCAINE HCL (PF) 1 % IJ SOLN
0.1000 mL | INTRAMUSCULAR | Status: DC | PRN
Start: 2019-11-02 — End: 2019-11-03

## 2019-11-02 MED ORDER — ACETAMINOPHEN 325 MG PO TABS
975.0000 mg | ORAL_TABLET | Freq: Once | ORAL | Status: AC
Start: 2019-11-02 — End: 2019-11-02
  Administered 2019-11-02: 975 mg via ORAL
  Filled 2019-11-02: qty 3

## 2019-11-02 MED ORDER — CEFTRIAXONE SODIUM 1 GM IJ SOLR
INTRAMUSCULAR | Status: AC
Start: 2019-11-02 — End: 2019-11-02
  Filled 2019-11-02: qty 1000

## 2019-11-02 MED ORDER — CEFAZOLIN SODIUM 1 GM IJ SOLR
INTRAMUSCULAR | Status: DC | PRN
Start: 2019-11-02 — End: 2019-11-03
  Administered 2019-11-02 (×2): 2000 mg via INTRAVENOUS

## 2019-11-02 MED ORDER — FENTANYL CITRATE (PF) 250 MCG/5ML IJ SOLN
INTRAMUSCULAR | Status: AC
Start: 2019-11-02 — End: 2019-11-02
  Filled 2019-11-02: qty 5

## 2019-11-02 MED ORDER — GABAPENTIN 300 MG OR CAPS
300.0000 mg | ORAL_CAPSULE | Freq: Once | ORAL | Status: AC
Start: 2019-11-02 — End: 2019-11-02
  Administered 2019-11-02: 18:00:00 300 mg via ORAL
  Filled 2019-11-02: qty 1

## 2019-11-02 MED ORDER — DEXAMETHASONE SODIUM PHOSPHATE 4 MG/ML IJ SOLN (CUSTOM)
INTRAMUSCULAR | Status: DC | PRN
Start: 2019-11-02 — End: 2019-11-03
  Administered 2019-11-02: 10 mg via INTRAVENOUS

## 2019-11-02 MED ORDER — OXYCODONE HCL 5 MG OR TABS
5.0000 mg | ORAL_TABLET | ORAL | Status: DC | PRN
Start: 2019-11-02 — End: 2019-11-03
  Administered 2019-11-02: 5 mg via ORAL
  Filled 2019-11-02: qty 1

## 2019-11-02 MED ORDER — VANCOMYCIN HCL 1 GM IV SOLR
INTRAVENOUS | Status: AC
Start: 2019-11-02 — End: 2019-11-02
  Filled 2019-11-02: qty 1000

## 2019-11-02 MED ORDER — FENTANYL CITRATE (PF) 250 MCG/5ML IJ SOLN
INTRAMUSCULAR | Status: DC | PRN
Start: 2019-11-02 — End: 2019-11-03
  Administered 2019-11-02 (×2): 25 ug via INTRAVENOUS
  Administered 2019-11-02: 150 ug via INTRAVENOUS
  Administered 2019-11-03: 50 ug via INTRAVENOUS
  Administered 2019-11-03: 25 ug via INTRAVENOUS
  Administered 2019-11-03: 50 ug via INTRAVENOUS
  Administered 2019-11-03: 25 ug via INTRAVENOUS

## 2019-11-02 MED ORDER — PROPOFOL 200 MG/20ML IV EMUL
INTRAVENOUS | Status: DC | PRN
Start: 2019-11-02 — End: 2019-11-03
  Administered 2019-11-02 (×2): 150 mg via INTRAVENOUS

## 2019-11-02 MED ORDER — OXYCODONE-ACETAMINOPHEN 5-325 MG OR TABS
1.0000 | ORAL_TABLET | Freq: Four times a day (QID) | ORAL | Status: DC | PRN
Start: ? — End: 2019-11-04

## 2019-11-02 MED ORDER — ROPIVACAINE HCL 5 MG/ML IJ SOLN
INTRAMUSCULAR | Status: AC
Start: 2019-11-02 — End: 2019-11-02
  Filled 2019-11-02: qty 30

## 2019-11-02 MED ORDER — SUGAMMADEX SODIUM 200 MG/2ML IV SOLN
INTRAVENOUS | Status: AC
Start: 2019-11-02 — End: 2019-11-02
  Filled 2019-11-02: qty 2

## 2019-11-02 MED ORDER — BUPIVACAINE HCL (PF) 0.5 % IJ SOLN
INTRAMUSCULAR | Status: DC | PRN
Start: 2019-11-02 — End: 2019-11-03
  Administered 2019-11-02: 10 mL

## 2019-11-02 MED ORDER — OXYCODONE-ACETAMINOPHEN 5-325 MG OR TABS
1.0000 | ORAL_TABLET | ORAL | Status: DC | PRN
Start: 2019-11-02 — End: 2019-11-03
  Administered 2019-11-02 (×2): 1 via ORAL
  Filled 2019-11-02 (×2): qty 1

## 2019-11-02 MED ORDER — ROPIVACAINE HCL 5 MG/ML IJ SOLN
INTRAMUSCULAR | Status: DC | PRN
Start: 2019-11-02 — End: 2019-11-03
  Administered 2019-11-02: 20 mL

## 2019-11-02 MED ORDER — THROMBIN (RECOMBINANT) 20000 UNIT EX SOLR
CUTANEOUS | Status: DC | PRN
Start: 2019-11-02 — End: 2019-11-03
  Administered 2019-11-02: 22:00:00 20000 [IU] via TOPICAL

## 2019-11-02 MED ORDER — LACTATED RINGERS IV SOLN
INTRAVENOUS | Status: DC
Start: 2019-11-02 — End: 2019-11-03

## 2019-11-02 MED ORDER — TOBRAMYCIN SULFATE 80 MG/2ML IJ SOLN
INTRAMUSCULAR | Status: AC
Start: 2019-11-02 — End: 2019-11-02
  Filled 2019-11-02: qty 4

## 2019-11-02 MED ORDER — BUPIVACAINE HCL (PF) 0.5 % IJ SOLN
INTRAMUSCULAR | Status: AC
Start: 2019-11-02 — End: 2019-11-02
  Filled 2019-11-02: qty 30

## 2019-11-02 MED ORDER — LIDOCAINE-EPINEPHRINE 1 %-1:100000 IJ SOLN
INTRAMUSCULAR | Status: AC
Start: 2019-11-02 — End: 2019-11-02
  Filled 2019-11-02: qty 60

## 2019-11-02 SURGICAL SUPPLY — 61 items
BLANKET BAIR HUGGER LOWER BODY (Misc Medical Supply) ×2 IMPLANT
BONE CANCELLOUS CHIPS 1.7-10 MM 15 ML ALLOGRAFT FREEZE DRIED (Bone/chips/putty) ×2 IMPLANT
BRUSH SRGN SCRUB CHG 4% (Prep Solutions) IMPLANT
CAUTERY TIP EDGE BLADE ELECTRODE 2.75", INSULATED (Cautery) ×2 IMPLANT
CORD VALLEYLAB BIPOLAR FORCEP- SINGE USE (Cautery) ×2 IMPLANT
COVER SNAP KOVER BANDED BAG 30" X 36" (Drapes/towels) IMPLANT
DBM PUTTY EVO3 ACELL 10ML (Uncategorized implant- biologic) ×2 IMPLANT
DRAIN RELIAVAC FLAT 10MM X 20CM 0070440 (Lines/Drains) IMPLANT
DRAIN SUCTION EVAC RELIAVAC 100CC (Lines/Drains) IMPLANT
DRAPE C-ARMOR FLUOROSCAN IMAGING (Drapes/towels) ×2 IMPLANT
DRESSING MEPILEX BORDER SACRUM 23 X 23CM (Dressings/packing) IMPLANT
DRESSING MEPILEX BORDER SACRUM SM 16 X 20CM (Dressings/packing) ×2 IMPLANT
DRILL BIT STD SST TWIST 2 MM X 127MM (Drills/Bits/Burs/Taps/Reamers) IMPLANT
DRILL PRECISION NEURO 3.0 X 3.8MM (Drills/Bits/Burs/Taps/Reamers) ×2 IMPLANT
FORCEP BIPOLAR 8 1/2 1.0MM TIP SINGLE USE (Cautery) ×2 IMPLANT
GLOVE BIOGEL INDICATOR UNDERGLOVE SIZE 8.5 (Gloves/Gowns) ×2 IMPLANT
GLOVE BIOGEL PI INDICATOR SIZE 6.5 (Gloves/Gowns) ×2 IMPLANT
GLOVE BIOGEL PI INDICATOR SIZE 8 (Gloves/Gowns) ×2 IMPLANT
GLOVE BIOGEL PI ULTRATOUCH SIZE 6.5 (Gloves/Gowns) ×2 IMPLANT
GLOVE SURGEON BIOGEL SIZE 7.5 (Gloves/Gowns) ×2 IMPLANT
GLOVE SURGICAL BIOGEL SIZE 8.5 (Gloves/Gowns) ×6 IMPLANT
GOWN NON-REINFORCED SIRUS SET IN SLV XL (Gloves/Gowns) ×2 IMPLANT
GOWN SURGICAL ULTRA XL BLUE, AAMI LVL 3 (Gloves/Gowns) IMPLANT
GOWN SURGICAL XXLG BLUE (Gloves/Gowns) IMPLANT
HEADREST PRONE POSITION (Patient Care Supply) IMPLANT
HEMOSTATIC MATRIX SURGIFLO W/O THROMBIN 2991 (Hemostatic agents/wax/sealants-absorbable) ×4 IMPLANT
HOVERMATT VELCRO SINGLE PATIENT USE 34" (NEW/REPROCESSED) (Patient Care Supply) IMPLANT
KIT INFUSE BONE GRAFT SMALL (Grafts) ×2 IMPLANT
KIT JACKSON PRONE VIEW PATIENT CARE KIT (Patient Care Supply) IMPLANT
KIT PT CARE JACKSON TABLE (Kits/Sets/Trays) IMPLANT
MILL BONE STRYKER MEDIUM, DISP (Misc Surgical Supply) IMPLANT
PACK AUTOGRAFT (Drape/Gowns/Gloves/Pack) IMPLANT
PACK SPINAL POSITIONING (Procedure Packs/kits) IMPLANT
PACK SPINAL SURGERY (Procedure Packs/kits) ×2 IMPLANT
PAD GROUND VALLEYLAB REM ADULT E7507 (Misc Surgical Supply) ×2 IMPLANT
PROBE NERVE STIMULATOR PRASS W/PROTECTED PIN OD 0.5MM MONOPOLAR (Misc Surgical Supply) IMPLANT
PROTECTOR ULNAR NERVE PAD, YELLOW (Misc Surgical Supply) IMPLANT
SEALER AQUAMANTYS 2.3 BIPOLAR (Lap/Endo/Arthroscopy) ×2 IMPLANT
SKIN CLOSURE SYSTEM PRINEO 22CM (Suture) ×2 IMPLANT
SLEEVE SCD KNEE MEDIUM (Misc Medical Supply) ×2 IMPLANT
SOLUTION IRR POUR BTL 0.9% NS 1000ML (Non-Pharmacy Meds/Solutions) IMPLANT
SOLUTION IRR POUR BTL H20 1000ML (Non-Pharmacy Meds/Solutions) IMPLANT
SPONGE LAP RF DETECT 4" X 18" XRAY STERILE (5 PIECES) (Sponges) IMPLANT
SPONGE SURGIFOAM LARGE (GELFOAM) (Hemostatic agents/wax/sealants-absorbable) ×2 IMPLANT
STRAP POSITIONING KNEE/BODY, HOOK AND LOOP (Misc Surgical Supply) IMPLANT
SURGICAL PACK SPINAL STAGING (Procedure Packs/kits) IMPLANT
SUTURE BOOT YELLOW (Misc Medical Supply) IMPLANT
SUTURE ETHILON 2-0 18" FS (Suture) ×2 IMPLANT
SUTURE ETHILON 2-0 30" FSLX (Suture) IMPLANT
SUTURE MONOCRYL PLUS 3-0 27" PS-2 (MCP427) (Suture) ×2 IMPLANT
SUTURE PROLENE 6-0 24" BV-1 (Suture)
SUTURE PROLENE 6-0 24" BV-1 8805H (Suture) IMPLANT
SUTURE VICRYL PLUS 0 18" MO-4 CR VCP701D (Suture) ×2 IMPLANT
SUTURE VICRYL PLUS 1 18" MO-4 CR VCP702D (Suture) ×2 IMPLANT
SUTURE VICRYL PLUS 2-0 18" CT-1 (Suture) ×1
SUTURE VICRYL PLUS 2-0 18" CT-1 VCP839D (Suture) ×1 IMPLANT
SUTURE VICRYL PLUS 2-0 27" CT-1 VCPP42D (Suture) ×2 IMPLANT
SYRINGE 10CC LL CONTRL (Misc Medical Supply) ×4 IMPLANT
TOWELS OR BLUE 4-PACK STERILE, DISPOSABLE (Drapes/towels) ×4 IMPLANT
TUBING SUCTION MEDI-VAC 9/32" X 20' (Tubing/Suction) ×2 IMPLANT
WAX BONE 2.5 GRAM HEMOSTATIC (Hemostatic agents/wax/sealants-absorbable) ×2 IMPLANT

## 2019-11-02 NOTE — H&P (Signed)
Orthopaedic Surgery Spine H&P    Current Hospital Stay:   0 days - Admitted on: 11/02/2019  11/02/19   4:48 PM    Requesting physician: Zlomislic, Alger Memos, MD    History:  ID:  Haley Barrett   39 year old female  [id number]    HPI:   39 year old female now approximately 9.5 months s/p revision lumbar fusion L4-5 as noted above. She was seen last by video Telehealth appointment at which time she decided she would like to proceed with exploration of L4-5 fusion with removal of hardware.     She states that she continues to have significant mid thoracic and thoracolumbar back pain. She states that at times she is unable to even get out of bed and has difficulty engaging in any activities. She does have some periods where she is able to do more, albeit still limited, but that any increase in her activity results in exacerbation of her pain. She has been trying to walk more, but remains quite limited in terms of her distance    Denies sensory changes, weakness, bowel or bladder changes    PMH:   ADD    SurgHx:   Wide lumbar laminectomy and instrumentation L4-5 in [place] (08/2018)  Revision lumbar stabilization XLIF 4/5 with PISF L4-5 (12/2018)    Meds:   Prior to Admission Medications   Prescriptions Last Dose Informant Patient Reported? Taking?   ALPRAZolam (XANAX) 0.25 MG tablet Past Week at Unknown time  Yes Yes   Sig: take 1 tablet by mouth once daily if needed   acetaminophen (TYLENOL) 325 MG tablet 11/02/2019 at Unknown time  No Yes   Sig: Take 3 tablets (975 mg) by mouth every 8 hours.   cyclobenzaprine (FLEXERIL) 5 MG tablet Unknown at Unknown time  No No   Sig: Take 1 tablet (5 mg) by mouth 3 times daily as needed for Muscle Spasms.   diazepam (VALIUM) 5 MG tablet 11/02/2019 at 1430  No Yes   Sig: Take 1 tablet (5 mg) by mouth every 6 hours as needed for Insomnia or Muscle Spasms.   diclofenac (VOLTAREN) 1 % gel   No No   Sig: Apply 1 g topically 4 times daily.   gabapentin (NEURONTIN) 300 MG capsule 11/02/2019 at 1315   Yes Yes   Sig: Take 300 mg by mouth 3 times daily.   ondansetron (ZOFRAN ODT) 4 MG disintegrating tablet Past Week at Unknown time  No Yes   Sig: Dissolve 1 tablet (4 mg) by mouth every 8 hours as needed for Nausea/Vomiting.   oxyCODONE-acetaminophen (PERCOCET) 5-325 MG tablet   Yes No   Sig: Take 1 tablet by mouth every 6 hours as needed for Severe Pain (Pain Score 7-10).   oxyCODONE-acetaminophen (PERCOCET) 5-325 MG tablet 11/02/2019 at 1315  Yes Yes   Sig: Take 1 tablet by mouth every 6 hours as needed for Severe Pain (Pain Score 7-10).   polyethylene glycol (GLYCOLAX) 17 GM/SCOOP powder   Yes No   Sig: 17 g by Oral route.   tiZANidine (ZANAFLEX) 4 MG tablet 11/01/2019 at Unknown time  No Yes   Sig: Take 1 tablet (4 mg) by mouth every 6 hours as needed (spasm).   zolpidem (AMBIEN) 5 MG tablet 11/01/2019 at 2000  No Yes   Sig: Take 1 tablet (5 mg) by mouth nightly as needed for Insomnia.      Facility-Administered Medications: None       All: Sulfa drugs and  Tramadol    FH: noncontributory    SH:    Social History     Socioeconomic History   . Marital status: Single     Spouse name: Not on file   . Number of children: Not on file   . Years of education: Not on file   . Highest education level: Not on file   Occupational History   . Not on file   Social Needs   . Financial resource strain: Not on file   . Food insecurity     Worry: Not on file     Inability: Not on file   . Transportation needs     Medical: Not on file     Non-medical: Not on file   Tobacco Use   . Smoking status: Never Smoker   . Smokeless tobacco: Never Used   Substance and Sexual Activity   . Alcohol use: Yes     Alcohol/week: 4.0 standard drinks     Types: 4 Glasses of wine per week     Comment: socially, couple drinks/week    . Drug use: Yes     Types: Cannabinoid (CBD)     Comment: edibles   . Sexual activity: Not Currently     Partners: Male     Birth control/protection: Pill, Abstinence   Lifestyle   . Physical activity     Days per week: Not  on file     Minutes per session: Not on file   . Stress: Not on file   Relationships   . Social Wellsite geologist on phone: Not on file     Gets together: Not on file     Attends religious service: Not on file     Active member of club or organization: Not on file     Attends meetings of clubs or organizations: Not on file     Relationship status: Not on file   . Intimate partner violence     Fear of current or ex partner: Not on file     Emotionally abused: Not on file     Physically abused: Not on file     Forced sexual activity: Not on file   Other Topics Concern   . Military Service No   . Blood Transfusions No   . Occupational Exposure No   . 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    Born in [place] and moved to [place] 6 years ago    Tour guide-food and history tours     Hobbies: beach volleyball, horse back riding, exercise      Social History     Socioeconomic History   . Marital status: Single     Spouse name: Not on file   . Number of children: Not on file   . Years of education: Not on file   . Highest education level: Not on file   Occupational History   . Not on file   Social Needs   . Financial resource strain: Not on file   . Food insecurity     Worry: Not on file     Inability: Not on file   . Transportation needs     Medical: Not on file     Non-medical: Not on file   Tobacco Use   . Smoking status: Never Smoker   .  Smokeless tobacco: Never Used   Substance and Sexual Activity   . Alcohol use: Yes     Alcohol/week: 4.0 standard drinks     Types: 4 Glasses of wine per week     Comment: socially, couple drinks/week    . Drug use: Yes     Types: Cannabinoid (CBD)     Comment: edibles   . Sexual activity: Not Currently     Partners: Male     Birth control/protection: Pill, Abstinence   Lifestyle   . Physical activity     Days per week: Not on file     Minutes per session: Not on file   .  Stress: Not on file   Relationships   . Social Wellsite geologist on phone: Not on file     Gets together: Not on file     Attends religious service: Not on file     Active member of club or organization: Not on file     Attends meetings of clubs or organizations: Not on file     Relationship status: Not on file   . Intimate partner violence     Fear of current or ex partner: Not on file     Emotionally abused: Not on file     Physically abused: Not on file     Forced sexual activity: Not on file   Other Topics Concern   . Military Service No   . Blood Transfusions No   . Occupational Exposure No   . 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    Born in [place] and moved to [place] 6 years ago    Tour guide-food and history tours     Hobbies: beach volleyball, horse back riding, exercise          Objective:    11/02/19  1429 11/02/19  1444   BP:  121/87   Pulse: 84    Resp: 18    Temp: 97.3 F (36.3 C)    SpO2: 98%         Physical Exam:   Gen: NAD  HEENT: in C collar  Pulm: NLB    Spine exam:  - Inspection: No gross deformities or obvious signs of trauma, skin intact  - Palpation: No TTP of thoracic spinous processes; no TTP of lumbar spinous processes    Lower Extremity Motor Strength  Muscle Left/5 Right/5 Comment   Hip flexion (L2) 5 5    Knee extension (L3) 5 5    Tibialis anterior (L4) 5 5    Extensor hallucis (L5) 5 5    Gastrocsoleus (S1) 5 5      Lower Extremity Sensation to Light Touch  Nerve Left Right Comment   L2 Intact Intact    L3 Intact Intact    L4 Intact Intact    L5 Intact Intact    S1 Intact Intact      Lower Extremity Special Testing        Left   Right  Clonus    Neg   Neg  Babinski   Neg   Neg  Patellar Reflex:  2+   2+  Vascular:   2+ DP/PT  2+ DP/PT      Laboratory Data:  Lab Results   Component Value Date    WBC 6.1 03/01/2019  RBC 4.32 03/01/2019    HGB 12.6 03/01/2019    HCT 39.8  03/01/2019    MCV 92.1 03/01/2019    MCHC 31.7 (L) 03/01/2019    RDW 12.5 03/01/2019    PLT 319 03/01/2019    MPV 8.6 (L) 03/01/2019     Lab Results   Component Value Date    NA 136 03/01/2019    K 4.1 03/01/2019    CL 101 03/01/2019    BICARB 24 03/01/2019    BUN 8 03/01/2019    CREAT 0.78 03/01/2019    GLU 101 (H) 03/01/2019    [place] 9.9 03/01/2019     No results found for: INR, PTT      Imaging:   IMPRESSION:  No acute fracture or dislocation of the lumbar spine.    Thin lucencies around both L4 screws, slightly more prominent than on prior study, suggesting micro motion. Posterior bone graft has not yet incorporated and is sparse. Anterior graft is incorporating, but not yet solidly fused. No change in posterior fluid collection at the surgical bed. No change in 4 mm L4/L5 anterolisthesis.    Assessment: 39 year old female s/p L4-5 XLIF and PSF, plan for exploration and removal or hardware today with possible revision.     Plan:   - To OR today for surgery as described above.     Talbert Cage  Orthopaedic Surgery Resident  [facility]    Orthopaedic Spine Surgery Service - Consent Documentation  Discussed recommended operative procedure with patient in detail. Indications and procedural description provided in depth. All questions answered. Risks of the surgery were discussed which included but were not limited to blood loss, infection, nerve damage, partial or complete paralysis, bowel or bladder dysfunction, development of adjacent segment disease, incomplete resolution of symptoms, symptomatic hardware, hardware failure, the possible need for additional operations, and the risks associated with anaesthesia including deep venous thrombosis, pulmonary embolism, stroke, heart attack, and potentially death. Patient verbalized an understanding of these risks and signed consent can be found in the chart and EMR.

## 2019-11-03 ENCOUNTER — Inpatient Hospital Stay (HOSPITAL_COMMUNITY): Payer: BC Managed Care – PPO

## 2019-11-03 DIAGNOSIS — M4310 Spondylolisthesis, site unspecified: Secondary | ICD-10-CM

## 2019-11-03 DIAGNOSIS — Z981 Arthrodesis status: Secondary | ICD-10-CM

## 2019-11-03 LAB — BASIC METABOLIC PANEL, BLOOD
Anion Gap: 9 mmol/L (ref 7–15)
BUN: 5 mg/dL — ABNORMAL LOW (ref 6–20)
Bicarbonate: 25 mmol/L (ref 22–29)
Calcium: 9 mg/dL (ref 8.5–10.6)
Chloride: 105 mmol/L (ref 98–107)
Creatinine: 0.78 mg/dL (ref 0.51–0.95)
GFR: >60 mL/min
Glucose: 134 mg/dL — ABNORMAL HIGH (ref 70–99)
Potassium: 4.3 mmol/L (ref 3.5–5.1)
Sodium: 139 mmol/L (ref 136–145)

## 2019-11-03 LAB — HEMOGRAM, BLOOD
Hct: 37.2 % (ref 34.0–45.0)
Hgb: 11.9 gm/dL (ref 11.2–15.7)
MCH: 29.9 pg (ref 26.0–32.0)
MCHC: 32 g/dL (ref 32.0–36.0)
MCV: 93.5 um3 (ref 79.0–95.0)
MPV: 8.7 fL — ABNORMAL LOW (ref 9.4–12.4)
Plt Count: 272 10*3/uL (ref 140–370)
RBC: 3.98 10*6/uL (ref 3.90–5.20)
RDW: 12.3 % (ref 12.0–14.0)
WBC: 10.2 10*3/uL — ABNORMAL HIGH (ref 4.0–10.0)

## 2019-11-03 MED ORDER — FAMOTIDINE IN NACL 20 MG/50ML IV SOLN
20.0000 mg | Freq: Two times a day (BID) | INTRAVENOUS | Status: DC
Start: 2019-11-03 — End: 2019-11-04
  Filled 2019-11-03: qty 50

## 2019-11-03 MED ORDER — ONDANSETRON HCL 4 MG/2ML IV SOLN
4.0000 mg | Freq: Once | INTRAMUSCULAR | Status: DC | PRN
Start: 2019-11-03 — End: 2019-11-03

## 2019-11-03 MED ORDER — MULTI-VITAMINS PO TABS
1.0000 | ORAL_TABLET | Freq: Every day | ORAL | Status: DC
Start: 2019-11-03 — End: 2019-11-04
  Administered 2019-11-03 – 2019-11-04 (×2): 1 via ORAL
  Filled 2019-11-03 (×2): qty 1

## 2019-11-03 MED ORDER — MIDAZOLAM HCL 2 MG/2ML IJ SOLN
1.0000 mg | INTRAMUSCULAR | Status: AC | PRN
Start: 2019-11-03 — End: 2019-11-03
  Administered 2019-11-03 (×2): 1 mg via INTRAVENOUS
  Filled 2019-11-03 (×2): qty 2

## 2019-11-03 MED ORDER — ACETAMINOPHEN 325 MG PO TABS
975.0000 mg | ORAL_TABLET | Freq: Three times a day (TID) | ORAL | Status: DC
Start: 2019-11-03 — End: 2019-11-03

## 2019-11-03 MED ORDER — ALPRAZOLAM 0.5 MG OR TABS
0.2500 mg | ORAL_TABLET | Freq: Once | ORAL | Status: AC
Start: 2019-11-03 — End: 2019-11-03
  Administered 2019-11-03: 10:00:00 0.25 mg via ORAL
  Filled 2019-11-03: qty 1

## 2019-11-03 MED ORDER — CEFAZOLIN SODIUM-DEXTROSE 2-4 GM/100ML-% IV SOLN
2000.0000 mg | Freq: Three times a day (TID) | INTRAVENOUS | Status: AC
Start: 2019-11-03 — End: 2019-11-03
  Administered 2019-11-03 (×4): 2000 mg via INTRAVENOUS
  Filled 2019-11-03 (×3): qty 100

## 2019-11-03 MED ORDER — ASCORBIC ACID 500 MG OR TABS
500.0000 mg | ORAL_TABLET | Freq: Two times a day (BID) | ORAL | Status: DC
Start: 2019-11-03 — End: 2019-11-04
  Administered 2019-11-03 – 2019-11-04 (×3): 500 mg via ORAL
  Filled 2019-11-03 (×3): qty 1

## 2019-11-03 MED ORDER — GABAPENTIN 300 MG OR CAPS
300.0000 mg | ORAL_CAPSULE | Freq: Three times a day (TID) | ORAL | Status: DC
Start: 2019-11-03 — End: 2019-11-04
  Administered 2019-11-03 – 2019-11-04 (×5): 300 mg via ORAL
  Filled 2019-11-03 (×5): qty 1

## 2019-11-03 MED ORDER — HYDROMORPHONE HCL 1 MG/ML IJ SOLN
0.5000 mg | INTRAMUSCULAR | Status: AC | PRN
Start: 2019-11-03 — End: 2019-11-04
  Administered 2019-11-03 – 2019-11-04 (×4): 0.5 mg via INTRAVENOUS
  Filled 2019-11-03 (×5): qty 0.5

## 2019-11-03 MED ORDER — MAGNESIUM HYDROXIDE 400 MG/5ML OR SUSP
30.0000 mL | Freq: Every evening | ORAL | Status: DC | PRN
Start: 2019-11-03 — End: 2019-11-04

## 2019-11-03 MED ORDER — TIZANIDINE HCL 4 MG OR TABS
2.0000 mg | ORAL_TABLET | Freq: Three times a day (TID) | ORAL | Status: DC | PRN
Start: 2019-11-03 — End: 2019-11-03

## 2019-11-03 MED ORDER — FAMOTIDINE 20 MG OR TABS
20.0000 mg | ORAL_TABLET | Freq: Two times a day (BID) | ORAL | Status: DC
Start: 2019-11-03 — End: 2019-11-04
  Administered 2019-11-03 – 2019-11-04 (×3): 20 mg via ORAL
  Filled 2019-11-03 (×3): qty 1

## 2019-11-03 MED ORDER — MIDAZOLAM HCL 2 MG/2ML IJ SOLN
2.0000 mg | Freq: Once | INTRAMUSCULAR | Status: AC
Start: 2019-11-03 — End: 2019-11-03
  Administered 2019-11-03: 02:00:00 2 mg via INTRAVENOUS

## 2019-11-03 MED ORDER — DIAZEPAM 5 MG OR TABS
5.0000 mg | ORAL_TABLET | Freq: Four times a day (QID) | ORAL | Status: DC | PRN
Start: 2019-11-03 — End: 2019-11-04
  Administered 2019-11-03 – 2019-11-04 (×5): 5 mg via ORAL
  Filled 2019-11-03 (×5): qty 1

## 2019-11-03 MED ORDER — LIDOCAINE 4 % EX PTCH
2.0000 | MEDICATED_PATCH | CUTANEOUS | Status: DC | PRN
Start: 2019-11-03 — End: 2019-11-04
  Administered 2019-11-03 – 2019-11-04 (×2): 2 via TRANSDERMAL
  Filled 2019-11-03 (×2): qty 2

## 2019-11-03 MED ORDER — SENNA 8.6 MG OR TABS
2.0000 | ORAL_TABLET | Freq: Every morning | ORAL | Status: DC
Start: 2019-11-03 — End: 2019-11-04
  Administered 2019-11-03 – 2019-11-04 (×2): 17.2 mg via ORAL
  Filled 2019-11-03 (×2): qty 2

## 2019-11-03 MED ORDER — FENTANYL CITRATE (PF) 50 MCG/ML IJ SOLN (WRAPPED RECORD) ~~LOC~~
50.0000 ug | INTRAMUSCULAR | Status: DC | PRN
Start: 2019-11-03 — End: 2019-11-03
  Administered 2019-11-03: 50 ug via INTRAVENOUS
  Filled 2019-11-03: qty 1

## 2019-11-03 MED ORDER — NALOXONE HCL 0.4 MG/ML IJ SOLN
0.1000 mg | INTRAMUSCULAR | Status: DC | PRN
Start: 2019-11-03 — End: 2019-11-04

## 2019-11-03 MED ORDER — KETAMINE HCL 50 MG/5ML IJ SOSY
PREFILLED_SYRINGE | INTRAMUSCULAR | Status: AC
Start: 2019-11-03 — End: 2019-11-03
  Filled 2019-11-03: qty 5

## 2019-11-03 MED ORDER — OXYCODONE HCL 10 MG OR TABS
10.0000 mg | ORAL_TABLET | ORAL | Status: DC | PRN
Start: 2019-11-03 — End: 2019-11-03

## 2019-11-03 MED ORDER — HALOPERIDOL LACTATE 5 MG/ML IJ SOLN
2.5000 mg | Freq: Once | INTRAMUSCULAR | Status: AC
Start: 2019-11-03 — End: 2019-11-03
  Administered 2019-11-03: 2.5 mg via INTRAVENOUS
  Filled 2019-11-03: qty 1

## 2019-11-03 MED ORDER — KETAMINE HCL 50 MG/5ML IJ SOSY
PREFILLED_SYRINGE | INTRAMUSCULAR | Status: DC | PRN
Start: 2019-11-03 — End: 2019-11-03
  Administered 2019-11-03: 02:00:00 10 mg via INTRAVENOUS
  Administered 2019-11-03 (×2): 20 mg via INTRAVENOUS

## 2019-11-03 MED ORDER — DOCUSATE SODIUM 250 MG OR CAPS
250.0000 mg | ORAL_CAPSULE | Freq: Every evening | ORAL | Status: DC
Start: 2019-11-03 — End: 2019-11-04
  Administered 2019-11-03 (×2): 250 mg via ORAL
  Filled 2019-11-03 (×2): qty 1

## 2019-11-03 MED ORDER — ACETAMINOPHEN 325 MG PO TABS
975.0000 mg | ORAL_TABLET | Freq: Three times a day (TID) | ORAL | Status: DC
Start: 2019-11-03 — End: 2019-11-04
  Administered 2019-11-03 – 2019-11-04 (×5): 975 mg via ORAL
  Filled 2019-11-03 (×4): qty 3

## 2019-11-03 MED ORDER — ONDANSETRON HCL 4 MG/2ML IV SOLN
4.0000 mg | Freq: Four times a day (QID) | INTRAMUSCULAR | Status: DC | PRN
Start: 2019-11-03 — End: 2019-11-04

## 2019-11-03 MED ORDER — LACTATED RINGERS IV SOLN
INTRAVENOUS | Status: DC
Start: 2019-11-03 — End: 2019-11-04

## 2019-11-03 MED ORDER — DIPHENHYDRAMINE HCL 50 MG/ML IJ SOLN
12.5000 mg | Freq: Once | INTRAMUSCULAR | Status: DC | PRN
Start: 2019-11-03 — End: 2019-11-03
  Administered 2019-11-03: 12.5 mg via INTRAVENOUS
  Filled 2019-11-03: qty 1

## 2019-11-03 MED ORDER — OXYCODONE HCL 5 MG OR TABS
15.0000 mg | ORAL_TABLET | ORAL | Status: DC | PRN
Start: 2019-11-03 — End: 2019-11-04
  Administered 2019-11-03 – 2019-11-04 (×8): 15 mg via ORAL
  Filled 2019-11-03 (×8): qty 1

## 2019-11-03 MED ORDER — HYDROMORPHONE HCL 1 MG/ML IJ SOLN
0.5000 mg | INTRAMUSCULAR | Status: DC | PRN
Start: 2019-11-03 — End: 2019-11-03
  Administered 2019-11-03 (×9): 0.5 mg via INTRAVENOUS
  Filled 2019-11-03 (×8): qty 0.5

## 2019-11-03 MED ORDER — POLYETHYLENE GLYCOL 3350 OR PACK
17.0000 g | PACK | Freq: Every day | ORAL | Status: DC
Start: 2019-11-03 — End: 2019-11-04
  Administered 2019-11-03 – 2019-11-04 (×2): 17 g via ORAL
  Filled 2019-11-03 (×2): qty 1

## 2019-11-03 MED ORDER — LACTATED RINGERS IV SOLN
INTRAVENOUS | Status: DC
Start: 2019-11-03 — End: 2019-11-03

## 2019-11-03 MED ORDER — PSYLLIUM 58.12 % PO PACK
1.0000 | PACK | Freq: Every day | ORAL | Status: DC
Start: 2019-11-03 — End: 2019-11-04
  Administered 2019-11-03 – 2019-11-04 (×3): 1 via ORAL
  Filled 2019-11-03 (×2): qty 1

## 2019-11-03 MED ORDER — OXYCODONE HCL 10 MG OR TABS
10.0000 mg | ORAL_TABLET | ORAL | Status: DC | PRN
Start: 2019-11-03 — End: 2019-11-04

## 2019-11-03 MED ORDER — OXYCODONE HCL 5 MG OR TABS
5.0000 mg | ORAL_TABLET | ORAL | Status: DC | PRN
Start: 2019-11-03 — End: 2019-11-04
  Filled 2019-11-03 (×2): qty 1

## 2019-11-03 MED ORDER — FENTANYL CITRATE (PF) 50 MCG/ML IJ SOLN (WRAPPED RECORD) ~~LOC~~
25.0000 ug | INTRAMUSCULAR | Status: DC | PRN
Start: 2019-11-03 — End: 2019-11-03

## 2019-11-03 MED ORDER — FENTANYL CITRATE (PF) 100 MCG/2ML IJ SOLN
INTRAMUSCULAR | Status: AC
Start: 2019-11-03 — End: 2019-11-03
  Filled 2019-11-03: qty 2

## 2019-11-03 MED ORDER — DEXMEDETOMIDINE HCL 200 MCG/2ML IV SOLN
INTRAVENOUS | Status: DC | PRN
Start: 2019-11-03 — End: 2019-11-03
  Administered 2019-11-03 (×3): 4 ug via INTRAVENOUS

## 2019-11-03 MED ORDER — NALOXONE HCL 0.4 MG/ML IJ SOLN
0.1000 mg | INTRAMUSCULAR | Status: DC | PRN
Start: 2019-11-03 — End: 2019-11-03

## 2019-11-03 MED ORDER — HALOPERIDOL LACTATE 5 MG/ML IJ SOLN
INTRAMUSCULAR | Status: DC
Start: 2019-11-03 — End: 2019-11-03
  Filled 2019-11-03: qty 1

## 2019-11-03 MED ORDER — OXYCODONE HCL 5 MG OR TABS
5.0000 mg | ORAL_TABLET | Freq: Once | ORAL | Status: DC | PRN
Start: 2019-11-03 — End: 2019-11-03

## 2019-11-03 MED ORDER — SUGAMMADEX SODIUM 200 MG/2ML IV SOLN
INTRAVENOUS | Status: DC | PRN
Start: 2019-11-03 — End: 2019-11-03
  Administered 2019-11-03: 200 mg via INTRAVENOUS

## 2019-11-03 MED ORDER — HYDROMORPHONE PCA 0.2 MG/ML SYRINGE
INTRAMUSCULAR | Status: DC
Start: 2019-11-03 — End: 2019-11-03
  Filled 2019-11-03: qty 50

## 2019-11-03 MED ORDER — DEXAMETHASONE SODIUM PHOSPHATE 4 MG/ML IJ SOLN (CUSTOM)
4.0000 mg | Freq: Four times a day (QID) | INTRAMUSCULAR | Status: AC
Start: 2019-11-03 — End: 2019-11-03
  Administered 2019-11-03 (×5): 4 mg via INTRAVENOUS
  Filled 2019-11-03 (×4): qty 1

## 2019-11-03 MED ORDER — ACETAMINOPHEN 10 MG/ML IV SOLN
1000.0000 mg | Freq: Once | INTRAVENOUS | Status: AC
Start: 2019-11-03 — End: 2019-11-03
  Administered 2019-11-03 (×2): 1000 mg via INTRAVENOUS
  Filled 2019-11-03: qty 100

## 2019-11-03 MED ORDER — BISACODYL 10 MG RE SUPP
10.0000 mg | Freq: Every day | RECTAL | Status: DC | PRN
Start: 2019-11-03 — End: 2019-11-04

## 2019-11-03 MED ORDER — TIZANIDINE HCL 4 MG OR TABS
4.0000 mg | ORAL_TABLET | Freq: Four times a day (QID) | ORAL | Status: DC | PRN
Start: 2019-11-03 — End: 2019-11-04

## 2019-11-03 MED ORDER — OXYCODONE HCL 5 MG OR TABS
15.0000 mg | ORAL_TABLET | ORAL | Status: DC | PRN
Start: 2019-11-03 — End: 2019-11-03
  Administered 2019-11-03 (×2): 15 mg via ORAL
  Filled 2019-11-03 (×2): qty 1

## 2019-11-03 MED ORDER — ZOLPIDEM TARTRATE 5 MG OR TABS
5.0000 mg | ORAL_TABLET | Freq: Every evening | ORAL | Status: DC | PRN
Start: 2019-11-03 — End: 2019-11-04
  Administered 2019-11-03: 23:00:00 5 mg via ORAL
  Filled 2019-11-03: qty 1

## 2019-11-03 MED ORDER — HALOPERIDOL LACTATE 5 MG/ML IJ SOLN
2.5000 mg | Freq: Once | INTRAMUSCULAR | Status: DC
Start: 2019-11-03 — End: 2019-11-03

## 2019-11-03 MED ORDER — MENTHOL 3 MG MT LOZG
1.0000 | LOZENGE | Freq: Three times a day (TID) | OROMUCOSAL | Status: DC | PRN
Start: 2019-11-03 — End: 2019-11-04

## 2019-11-03 NOTE — Interdisciplinary (Signed)
PT Contact     Row Name 11/03/19 1130       Therapy Contact Note    Contact Time  1130    Therapy not provided at this time as  Other (comment)    Additional Comments  Pt with significant pain earlier. RN medicated and pt now resting. PT will re-attempt later.

## 2019-11-03 NOTE — Brief Op Note (Signed)
BRIEF OPERATIVE NOTE    DATE: 11/02/2019  TIME: 11:15 PM    PREOPERATIVE DIAGNOSIS:   1. Chronic low back pain  2. S/p remote revision lumbar fusion L4-5    POSTOPERATIVE DIAGNOSIS:   Same    PROCEDURE:   1. Removal of retained instrumentation L4-5 consisting of screws and rods (Depuy synthes)  2. Exploration of fusion L4-5 (no motion)  3. Revision laminectomies L4-5 with removal of deep seroma  4. Posterior in situ fusion L4-5 with corticocancellous allograft chips, DBM (accel), rhBMP-2  5. Interpretation of intraoperative fluoroscopy    ATTENDING SURGEON: Aneisa Karren  ASSISTANTS(s): Atten[place], Hachadorian    ANESTHESIA: GETA    FINDINGS: deep epidural seroma; no motion    WOUND CLASSIFICATION:Class I (clean)    WOUND CLOSURE STATUS:All layers of surgical incision (deep and superficial) were fully closed.    SPECIMENS: None    Fluids/Blood Products:      IV Fluids: 200cc LR    Blood Products: None    EBL: 10cc    Urine Output: no foley    COMPLICATIONS: None    DISPOSITION: Stable to PACU

## 2019-11-03 NOTE — Interdisciplinary (Signed)
11/03/19 1047   Initial Assessment   CM Initial Assessment * Completed   Patient Information   Where was the patient admitted from? * Home   Prior to Level of Function * Ambulatory/Independent with ADL's   Assistive Device * Walker;Shower chair   Prior [employer]) * Self   Primary Contact Name, Number and Relationship Conley Simmonds MOTHER [phone] / [phone]   Permission to Contact * Yes   Secondary Contact Name, Number and Relationship Beougher,TOM FATHER   [phone]   Conservator/Public Guardian Name and [place] Unemployed   Financial Resources Other (Comment)  (BLUE SHIELD COVERED [place])   Discharge Planning   Living Arrangements * Alone   Available Assistance/Support System * Parent  (MOTHER WILL ASSIST DURING RECOVERY)   Type of Residence * [place] * Yes  (HX WITH [facility])   Additional Services Not Applicable   Anticipated Discharge Dispostion/Needs Home;Home with Family;HH PT;HH RN   Patient's Discharge Goal(s) Home;Home Health   Barriers to Discharge * None   Do you have difficulty affording your medications No   Patient/Family/Other Engaged in Discharge Planning * Yes   Name, Relationship and Phone Number of Person Engaged in the Discharge Plan PATIENT   Patient Has Decision Making Capacity * Yes   Patient/Family/Legal/Surrogate Decision Maker Has Been Given a List Options And Choice In The Selection of Post-Acute Care Providers * Yes   CM discussed the following with pt, and/or family, and/or DPOA [place] has agreements with select post-acute care providers in the collaborative care network   Legal Designee/Surrogate Name/Relationship/Contact Info NA   Family/Caregiver's Assessed for * Readiness, willingness, and ability to provide or support self-management activities;Readiness to provide care to the patient   Respite Care * Not Applicable   Patient/Family/Other Are In Agreement With Discharge  Plan * To be determined   Public Health Clearance Needed * Not Applicable   Social Worker Consult   Do you need to see a Education officer, museum? * No   Readmission Risk Assessment   Readmission Within 30 Days of Discharge * No   Recent Hospitalizations (Within Last 6 Months) * No   High Risk For Readmission * No   MOON   MOON Provided to Patient Not Applicable       LOS at time of Initial Assessment: 20 Hours  Pt admitted on 11/02/2019  1:56 PM  LACE+ Score: 40    Medical Necessity:  Scheduled surgery    11/02/2019 Procedure(s):  exploration of fusion lumbar 4 - lumbar 5, removal of hardware, revision fusion, decompression    CM Assessment:  Case Manager met with patient for initial DC planning.  Confirmed phone number and address on face sheet.  Patient will return to address in face sheet.     PLOF: Independent with ADLs and mobility.  Support system: Mother will stay during recovery  Prior Durable Medical Equipment:   has FWW  Prior SNF/ Home Health:  Yes.  Hx of HH with [facility].  Prefer the same provider for post acute HH needs.    PCP verified: Gwynneth Munson  Ph[facility]acy: [facility] [phone] [place], [place]   Anticipated DC disposition (home, SNF, etc) :  home  Anticipated DC needs (HH, DME, none, etc):   HH PT/RN - referral initiated to patient's agency of choice.   DME -  none, has fWW  Anticipated barriers to discharge: None identified at this time.  Transportation:  parent      Expected discharge date:  2-3 days    Case Manager will continue to assess needs for safe transition to home or next level of care.       Louretta Shorten RN BSN [facility]  Care Manager  [phone]

## 2019-11-03 NOTE — Addendum Note (Signed)
Addendum  created 11/03/19 0220 by Bernita Raisin, MD    Intraprocedure Meds edited, Order list changed

## 2019-11-03 NOTE — Plan of Care (Signed)
Problem: Promotion of Health and Safety  Goal: Promotion of Health and Safety  Description: The patient remains safe, receives appropriate treatment and achieves optimal outcomes (physically, psychosocially, and spiritually) within the limitations of the disease process by discharge.    Information below is the current care plan.  Outcome: Progressing  Flowsheets  Taken 11/03/2019 1508  Individualized Interventions/Recommendations #1: Monitor for pain and treat with PRN pain medicaitons  Individualized Interventions/Recommendations #2 (if applicable): Offer ice packs for back discomfort  Individualized Interventions/Recommendations #3 (if applicable): Encourage compliance with PT and OT  Individualized Interventions/Recommendations #4 (if applicable): Encourage frequent position changes  Individualized Interventions/Recommendations #5 (if applicable): Cluster care to promote rest  Outcome Evaluation (rationale for progressing/not progressing) every shift: Pt taken off of dilaudid PCA and transitioned to oral pain meds. Pt still in a lot of pain on oral oxycodone 15mg , pt has recieved 2/3 rescue doses, will continue to monitor. Pt using ice packs and lidocane patches to help with lower back soreness and pain. Pt turns self for comfort. Pt worked with PT and OT this shift. Care clustered to promote rest.  Taken 11/03/2019 0830  Patient /Family stated Goal: Pain control

## 2019-11-03 NOTE — Interdisciplinary (Signed)
Physical Therapy Evaluation    Admitting Physician:  Zlomislic, Vinko, MD  Admission Date 11/02/2019    Inpatient Diagnosis:   Problem List       Codes    Pain     ICD-10-CM: R52  ICD-9-CM: 780.96    Relevant Orders    X-Ray Fluoroscopy Up To 1 Hr - OR (Completed)    Decreased activities of daily living (ADL)     ICD-10-CM: Z78.9  ICD-9-CM: V49.89    Difficulty walking     ICD-10-CM: R26.2  ICD-9-CM: 719.7          IP Start of Service   Start of Care: 11/03/19  Onset Date: 11/02/2019  Reason for referral: Activity tolerance limitation;Decline in functional ability/mobility    Preferred Language:English         Past Medical History:   Diagnosis Date   . ADD (attention deficit disorder)    . Clavicular fracture       Past Surgical History:   Procedure Laterality Date   . BACK SURGERY  07/28/2018    In [place]   . HUMERUS FRACTURE SURGERY Left 1993   . ------------OTHER-------------  39 yo    humerus ORIF    . SPINE SURGERY  July 28, 2018, April 2020       PT Acute     Row Name 11/03/19 1500          Type of Visit    Type of Physical Therapy note  Physical Therapy Evaluation     Row Name 11/03/19 1500          Treatment Precautions/Restrictions    Precautions/Restrictions  Fall;Spine     Fall  Socks/charm     Other Precautions/Restrictions Information  LSO when OOB for mobility     Row Name 11/03/19 1500          Medical History    History of presenting condition  Patient is POD #1 L4-5 fusion hardware removal.     Fall history  No falls reported in the last 6 months     Row Name 11/03/19 1500          Functional History    Prior Level of Function  Minimal deficits     Equipment required for mobility in the home  None     Other Functional History Information  Just limited overall by pain in back with walking     Row Name 11/03/19 1500          Social History    Living Situation  Lives alone     Home Environment  Apartment     Home accessibility   Performs activities of daily living (ADL's) on one level elevator  access     Other Social History Information  Patient states her mom will stay with her after surgery.      Row Name 11/03/19 1500          Subjective    Subjective Information  "I just don't want this pain anymore."     Patient status  Patient agreeable to treatment;Nursing in agreement for treatment     Row Name 11/03/19 1500          Pain Assessment    Pain Asssessment Tool  Numeric Pain Rating Scale     Row Name 11/03/19 1500          Numeric Pain Rating Scale    Pain Intensity - rating at present  7  Pain Intensity- rating after treatment  7     Location  low back     Row Name 11/03/19 1500          Objective    Overall Cognitive Status  Intact - no cognitive limitations or impairments noted     Communication  No communication limitations or impairments noted. Current status of hearing, speech and vision allow functional communication.     Coordination/Motor control  No limitations or impairments noted. Movement patterns are fluid and coordinated throughout     Balance  Balance limitations present     Static Sitting Balance  Good - able to maintain balance without handhold support, limited postural sway     Dynamic Sitting Balance  Good - accepts moderate challenge, able to maintain balance while picking object off floor     Static Standing Balance  Fair - able to maintain balance with handhold support, may require occasional minimal assistance     Dynamic Standing Balance  Fair - accepts minimal challenge, able to maintain balance while turning head/trunk     Other Balance Information  fair balance when standing with B UE on FWW, minor LOBs when pain exacerbates.     Extremity Assessment  Flexibility, strength, muscle tone and sensation grossly within functional limits throughout     Other  Extremity Assessment  Information  4/5 grossly     Functional Mobility  Functional mobility deficits present     Bed Mobility  Supervised     Bed Mobility Comments  log roll to R with supervision     Transfers to/from  Stand  Supervised     Transfer Comments  Patient is able to perform sit to stand from bed with supervision and FWW     Gait  Other (comments) CGA     Gait Comments  Patient is able to ambulate 100' with FWW and CGA with no LOB. Patient is able to demonstrate step through gait pattern with decreased cadence. Patient is unable to progress with gait as patient is limited by pain.      Device used for ambulation/mobility  Front wheeled walker     Ambulation Distance  100'     Other Objective Findings  Patient educated on putting on LSO at the edge of the bed and instructed to perform corset portion at the same time. Patient educated about log roll importance/compliance and no bending lfiting, twisting.                Eval cont.     [place] Name 11/03/19 1500          [place] AM-PAC: Basic Mobility    Assistance Needed to Turn from Back to Side While in a Flat Bed Without Using Bedrails  3 - A little (supervised/min assist)     Difficulty with Supine to Sit Transfer  3 - A little (supervised/min assist)     How Much Help Needed to Move to/from Bed to Chair  3 - A little (supervised/min assist)     Difficulty with Sit to Stand Transfer from Chair with Arms  3 - A little (supervised/min assist)     How Much Help Needed to Walk in Room  3 - A little (supervised/min assist)     How Much Help Needed to Climb 3-5 Steps with a Rail  3 - A little (supervised/min assist)     AMPAC Total Score  18     Assessment: AM-PAC Basic Mobility Impairment Rating  Score  13-18 - 40-59% impaired     Row Name 11/03/19 1500          Patient/Family Education    Learner(s)  Patient     Learner response to rehab patient education interventions  Verbalizes understanding;Able to return demonstrate teaching     Patient/family training comments  PT role, POC and DC plan.      Row Name 11/03/19 1500          Assessment    Assessment  Patient seen for skilled IP PT Evaluation. Patient is able to ambulate 100' with FWW and no LOB. Patient is limited primarily  by pain throughout session. Patient to continue to benefit from skilled IP PT to improve functional mobility. Once pt is medically stable, recommend pt DC home with FWW and HH PT or progress to OP PT. Patient educated about the importance of OOB mobility, pt acknowledged understanding.      Rehab Potential  Good     Row Name 11/03/19 1500          Patient stated Goal    Patient stated goal  to have less pain     Row Name 11/03/19 1500          Goal 1 (Short Term)    Impairment  Functional mobility limitation     Functional mobility  Patient able to consistently complete bed mobility skills with no more than minimum assistance     Custom goal  with log roll     Number of visits  5     Goal Status  New     Row Name 11/03/19 1500          Goal 2 (Short Term)    Impairment  Functional mobility limitation     Functional mobility  Patient able to consistently complete sit to stand transfer safely between surfaces with no more than minimum assistance     Number of visits  7     Goal Status  New     Row Name 11/03/19 1500          Goal 3 (Short Term)    Impairment  Gait impairment     Gait  Patient able to consistently ambulate household distances with least restrictive assistive device with no more than supervision assistance     Number of visits  10     Goal Status  New     Row Name 11/03/19 1500          Planned Therapy Interventions and Rationale    Gait Training  to normalize gait pattern and improve safety while ambulating;to normalize gait pattern and improve safety while ambulating with assistive device     Neuromuscular Re-Education  to improve safety during dynamic activities;to normalize muscle tone and coordination     Therapeutic Activities  to improve functional mobility and ability to navigate in the home and/or community;to improve transfers between surfaces     Theraputic Exercise  to increase range of motion to allow greater independence with functional mobility skills;to increase strength to allow greater  independence with functional mobility skills     Row Name 11/03/19 1500          Treatment Plan Disussion    Treatment Plan Discussion and Agreement  Patient support system determined and all questions were asked and answered;Patient/family/caregiver stated understanding and agreement with the therapy plan     Row Name 11/03/19 1500          Treatment Plan  Continue therapy to address  Activity tolerance limitation;Decline in functional ability/mobility     Frequency of treatment  7 times per week     Duration of treatment (number of visits)  While patient is hospitalized and in need of skilled therapy services     Status of treatment  Patient evaluated and will benefit from ongoing skilled therapy     Row Name 11/03/19 1500          Patient Safety Considerations    Patient safety considerations  Patient returned to bed at end of treatment;Call light left in reach and fall precautions in place;Patient left  in appropriate pressure relieving position;Nursing notified of safety considerations at end of treatment;Patient may be at risk for falls     Patient assistive device requirements for safe ambulation  Lupita Shutter Name 11/03/19 1500          Therapy Plan Communication    Therapy Plan Communication  Discussed therapy plan with Nursing and/or Physician;Encouraged out of bed with assistance by     Encouraged out of bed with assistance by  Nursing     Row Name 11/03/19 1500          Physical Therapy Patient Discharge Instructions    Your Physical Therapist suggests the following  Supervision with walking is suggested for increased safety;Continue to use your assistive device as instructed when walking to improve your stability and prevent falls;Continue to complete your home exercise program daily as instructed     Row Name 11/03/19 1600          Type of Eval    Low Complexity [phone])  Completed     Row Name 11/03/19 1600          Therapeutic Procedures    Gait Training (81275)  Assistive device training;Dynamic  activities while walking;Gait training with varying resistance or speed;Gait pattern analysis and treatment of deviations;Muscle facilitation to address gait deviation(s);Postural alighnment/biomechanic training during gait;Patient education;Stair/curb/obstacle navigation training        Total TIMED Treatment (min)   15     Orthotic Mgmt/Training, initial encounter, each 15 minutes (17001)  Orthotic/splint fitting;Orthotic/splint modification;Orthotic/splint training;Patient education        Total TIMED Treatment (min)   15     Row Name 11/03/19 1500          Treatment Time     Total TIMED Treatment  (min)  30     Total Treatment Time (min)  45     Treatment start time  1315         Post Acute Discharge Recommendations  Discharge Rehabilitation Reccomendations ([place] ONLY): If medically appropriate and available, patient demonstrates tolerance to participate in skilled therapy at the following anticipated level  Therapy level: Home health;Outpatient  Equipment recommendations: Lyda Perone Justification: Patient safety and mobility is enhanced by the use of a walker. Patient has indicated agreement to utilize the walker during Mobility Related Activities of Daily Living (MRADLs) and is able to complete MRADLs in a more timely manner using a walker.    The physical therapist of record is endorsed by evaluating physical therapist.

## 2019-11-03 NOTE — Discharge Instructions (Signed)
Surgeon:   Dr. Christa See    Follow-up Appointment:  Your clinic appointment will be:    Future Appointments   Date Time Provider Department Center   11/08/2019  1:00 PM Zlomislic, Alger Memos, MD [facility]   11/16/2019  1:00 PM Henrene Pastor, MD [facility]   11/22/2019  2:20 PM Zlomislic, Alger Memos, MD [facility]       If this appointment was not scheduled while you were in the hospital then our clinic scheduler will call you in the next few days to schedule this appointment. If you do not hear from [place] within 3 days you should call the clinic at [phone] (for [facility]) or [phone] (for [place]). You should see [place] in clinic within 2 weeks from hospital discharge.       Reasons to Contact a Doctor Urgently:     Call 911 or return to the hospital immediately if: If you have chest pain, sudden onset shortness of breath, difficulty breathing, dizziness, lightheadedness, change in mental status or any other concerning issues.    If you develop a fever (>101), redness or drainage from the surgical incision site, swelling or redness of an extremity, profuse bleeding or excessive drainage from incisional site, or other concerning issues please call our office to arrange for an evaluation or go to the emergency department.     If you have any questions about your hospital care, your medications, or if you have new or concerning symptoms soon after going home from the hospital, your hospital physician can be contacted in the following manner:    Medical Center operator at [phone].  Orthopaedic Clinic Scheduler Phone Number: [phone] [facility]) or [phone] [facility]).    Once you are able to see your primary care physician (PCP), your PCP will then be responsible for further medication refills, or appointment referrals.      General Instructions for After Discharge    Your activity level at home should be:  As tolerated with no bending, twisting, or lifting over 5 lbs.  (approximately a gallon of milk).    If you were given instructions to wear a back brace, please wear the brace when out of bed except when in the shower.      Wound Care/Hygiene:  Please leave your surgical dressing on for 5 days from the date of surgery. No showering during that time. Sponge baths only.     Keep dressing clean and dry at all time.     Prior to discharge, ask your nurse to provide you with extra bandages should the existing one get dirty or fall off.    On day 5, take your dressing off and you may shower. Please pat the wound dry with a clean towel so you do not disturb any of the scab formation. Do not pull at the Dermabond, but leave it in place until it falls off on its own.  Cover the incision with sterile guaze and paper tape and change dressing daily until your first post operative visit.    Please call us if there is redness, swelling or drainage.     If any concerns with showering, then OK to leave all dressings in place and sponge bathe only.    Do not bathe in a tub or swim or Jacuzzi (ie do not submerge the incision) until instructed to do so by your surgeon.    Do not use ointments or creams on the incision.  You may notice some bruising around the incision.  This is not uncommon and should begin to go away within the first 2 weeks after surgery.      Medications:    Take medications as prescribed. The short acting narcotic pain medication (usually Oxycodone) can be continued as needed, not to exceed the directions on the prescription (usually up to every 4 hours or 6 pills per day). To help wean off of the pain medications or to supplement your pain control you can use Tylenol to help with pain. If you run out of medication before your follow up visit, please let our clinic know so that we may authorize a refill if indicated. Please keep in mind it may take up to 72 hours for a refill to be authorized.    You may be given a one time prescription for Lidocaine patches. These are  usually only covered once by insurance. However, 4% lidocaine patches can be purchased over the counter if you run out.    Your medication list is located on this After Visit Summary in the Current Discharge Medication List section.  Your nurse will review this information with you before you leave the hospital.    It is very important for you to keep a current medication list with you in order to assist your doctors with your medical care.  Bring this After Visit Summary with you to your follow up appointments.    While you are taking the narcotic pain medication, take the stool softener and drink plenty of water to prevent constipation.    Do not drive while on narcotic medications.    Do not mix narcotic medications with alcohol.       What Needs to Happen Next After Discharge -- Appointments and Follow Up    You should have an office appointment about 2 weeks following your discharge from the hospital.  If you do not please call [phone] ([place]) or [phone] [facility]).     Generally, you should return to see your surgeon at the following intervals, but this may be individualized depending on special circumstances.    Any appointments already scheduled at [facility] clinics will be listed in the Future Appointments section at the top of this After Visit Summary.  Any appointments that have been requested, but have not yet been scheduled, will be listed below that under Post Discharge Referrals.

## 2019-11-03 NOTE — Progress Notes (Signed)
Orthopedics Spine Progress Note  11/03/2019    Patient ID:  Name: Haley Barrett  MRN: [id number]  DOB: 10/07/1980    Procedures:  DOS 11-02-19  1. Removal of posterior instrumentation at L4-5  2. In situ fusion L4-5 with allograft and BMP    Subjective:  Doing well, pain well controlled on pca. Will transition to PO and see how she does later in the afternoon for possible dc. Denies new weakness/paresthesias/denies CP/SOB. No complaints    Objective:  Vital Signs:  BP 117/67 (BP Location: Right arm, BP Patient Position: Semi-Fowlers)   Pulse 64   Temp 97.8 F (36.6 C)   Resp 16   Ht 5' 10.9" (1.801 m)   Wt 81.1 kg (178 lb 11.2 oz)   SpO2 100%   BMI 24.99 kg/m     Physical Exam:  General: patient awake, alert, and responding to commands; no apparent distress  Cardio: regular rate and rhythm per pulse  Respiratory: patient breathing comfortably without use of accessory muscles  Back: dressings in place, clean/dry/intact    Bilateral Lower Extremity   Motor:       Right           Left        Iliopsoas (L2/3)                     5/5           5/5         Quadriceps (L4)                    5/5           5/5        Tibialis Anterior (L4/5)           5/5           5/5        EHL (L5)                               5/5           5/5        GSC (S1)                              5/5           5/5   Sensation:        SILT in L2-S1 distributions.   Vascular Exam:        Warm and well perfused distally    Laboratory Data:   Lab Results   Component Value Date    WBC 10.2 (H) 11/03/2019    HGB 11.9 11/03/2019    HCT 37.2 11/03/2019    PLT 272 11/03/2019     Lab Results   Component Value Date    NA 139 11/03/2019    K 4.3 11/03/2019    CL 105 11/03/2019    BICARB 25 11/03/2019    BUN 5 (L) 11/03/2019    CREAT 0.78 11/03/2019    GLU 134 (H) 11/03/2019    [place] 9.0 11/03/2019     No results found for: INR, PTT      Input/Output:    Intake/Output Summary (Last 24 hours) at 11/03/2019 0950  Last data filed at 11/03/2019 0300  Gross per 24  hour   Intake 320 ml   Output -  Net 320 ml       Current Medications:  Current Facility-Administered Medications   Medication   . acetaminophen (TYLENOL) tablet 975 mg   . ALPRAZolam (XANAX) tablet 0.25 mg   . ascorbic acid (VITAMIN C) tablet 500 mg   . bisacodyl (DULCOLAX) suppository 10 mg   . ceFAZolin in dextrose (ANCEF) IVPB 2,000 mg   . dexAMETHasone (DECADRON) injection 4 mg   . diazepam (VALIUM) tablet 5 mg   . docusate sodium (COLACE) capsule 250 mg   . famotidine (PEPCID) IVPB 20 mg    Or   . famotidine (PEPCID) tablet 20 mg   . gabapentin (NEURONTIN) capsule 300 mg   . HYDROmorphone (DILAUDID) injection 0.5 mg   . lactated ringers infusion   . lidocaine (ASPERCREME) 4 % patch 2 patch   . magnesium hydroxide (MILK OF MAGNESIA) suspension 30 mL   . menthol (CEPACOL) lozenge 3 mg   . multivitamin tablet 1 tablet   . nalOXone (NARCAN) injection 0.1 mg   . ondansetron (ZOFRAN) injection 4 mg   . oxyCODONE (ROXICODONE) tablet 10 mg   . oxyCODONE (ROXICODONE) tablet 15 mg   . polyethylene glycol (MIRALAX) packet 17 g   . psyllium (METAMUCIL) 58.12 % packet 1 packet   . senna (SENOKOT) tablet 17.2 mg   . tiZANidine (ZANAFLEX) tablet 2 mg   . zolpidem (AMBIEN) tablet 5 mg       Assessment:   39 year old female 1 Day Post-Op s/p above procedures. Patient doing well and progressing appropriately. Will eval in PM for dc today  - Acute Blood Loss Anemia with component of hemodilution    Plan:  - PT: mob as tolerated, lumbar corset prn for comfort  - Antibiotics: complete peri-operative ancef  - DVT Prophylaxis: no pharmacologic prophylaxis; SCDs/TED hose, ambulation  - Pain Medication: multimodal  - Diet: regular  - Post op imaging: L spine XR  - Drain: none  - Foley: none  - Other: aggressive incentive spirometry  - Dispo: pending XR, PT clearance, pain control on oral pain medication, CM clearance      Marilynn Rail, MD  Orthopaedic Spine Fellow      Please page the Orthopaedic Spine Surgery Team with questions  or concerns based on patient location:     Established spine floor patients at [place]: Ortho Spine 1 - [phone]      Established spine floor patients at [facility]: Ortho Spine 2 - [phone]      New spine consultations not yet followed by spine team:    View [facility] on call for "SPINE CONSULT" call schedule (Orthopaedic Spine versus Neurosurgery)   If Orthopaedic Spine is on call please page the PRIMARY CONSULT RESIDENT on call:   [place]: ORTHOPEDICS/TH   [place]: ORTHOPEDICS/HC

## 2019-11-03 NOTE — RN OR/Procedure Note (Addendum)
Pt very agitated , reassured that pain medication is being given and anesthesia paged a second time to come to bedside.   Medicated by anesthesia at bedside with ketamine and versed iv.

## 2019-11-03 NOTE — Interdisciplinary (Signed)
Occupational Therapy Evaluation    Admitting Physician:  Zlomislic, Vinko, MD  Admission Date 11/02/2019    Inpatient Diagnosis:   Problem List       Codes    Pain     ICD-10-CM: R52  ICD-9-CM: 780.96    Relevant Orders    X-Ray Fluoroscopy Up To 1 Hr - OR (Completed)    Decreased activities of daily living (ADL)     ICD-10-CM: Z78.9  ICD-9-CM: V49.89          IP Start of Service  Start of Care: 11/03/19  Reason for referral: Decline in functional ability/mobility;Decline in performance of activities of daily living (ADL);Activity tolerance limitation    Preferred Language:English         Past Medical History:   Diagnosis Date   . ADD (attention deficit disorder)    . Clavicular fracture       Past Surgical History:   Procedure Laterality Date   . BACK SURGERY  07/28/2018    In [place]   . HUMERUS FRACTURE SURGERY Left 1993   . ------------OTHER-------------  39 yo    humerus ORIF    . SPINE SURGERY  July 28, 2018, April 2020       OT Acute     Row Name 11/03/19 1100          Type of Visit    Type of Occupational Therapy note  Occupational Therapy Evaluation     Row Name 11/03/19 1100          Treatment Time    Treatment Start Time  0258     Total TIMED Treatment (min)  30     Total Treatment Time (min)  32     Row Name 11/03/19 1100          Treatment Precautions/Restrictions    Precautions/Restrictions  Fall;Spine     Fall  Socks/charm     Other Precautions/Restrictions Information  lumbar corset for [place] Name 11/03/19 1100          Medical History    History of presenting condition  POD 1    1. Removal of retained instrumentation L4-5 consisting of screws and rods (Depuy synthes)  2. Exploration of fusion L4-5 (no motion)  3. Revision laminectomies L4-5 with removal of deep seroma  4. Posterior in situ fusion L4-5 with corticocancellous allograft chips, DBM (accel), rhBMP-2  5. Interpretation of intraoperative fluoroscopy       Fall history  No falls reported in the last 6 months     [place] Name 11/03/19  1100          Functional History    Prior Level of Function  Minimal deficits     General ADL/Self-Care Assistance Needs  Independent with ADLs and self care using adaptive device/equipment     Self Care Equipment/Device(s) in the home  Shower chair/bench     Equipment required for mobility in the home  None     Row Name 11/03/19 1100          Social History    Living Situation  Lives alone Mother staying post-acute to assist with needs     [place] accessibility  Stairs present;Accessible with wheelchair or walker Elevator available     Number of steps to enter home  - 2 flights     Number of steps within home  0     Bathroom accessibility  Tub/shower;Shower/tub seat     Row Name 11/03/19 1100          Subjective    Subjective information  "I'm just worried that I still have pain"      Patient status  Patient agreeable to treatment;Nursing in agreement for treatment     Row Name 11/03/19 1100          Pain Assessment    Pain Asssessment Tool  Numeric Pain Rating Scale     Row Name 11/03/19 1100          Numeric Pain Rating Scale    Pain Intensity - rating at present  7     Pain Intensity- rating after treatment  7     Location  spine incision     Row Name 11/03/19 1100          Activities of Daily Living (ADLs)    Upper Body Dressing  Supervised     Other Upper Body Dressing Information  cuing for proper technqiue to don/doff brace     Row Name 11/03/19 1100          [place] AM-PAC: Daily Activity    Assistance Needed to Put on and Take off Regular Lower Body Clothing  2     Assistance Needed to Bathe, Including Washing, Rinsing, and Drying  2     Assistance Needed to Toilet Environmental manager, Bedpan, or Urinal)  3     Assistance Needed to Put on and Take off Regular Upper Body Clothing  3     Assistance Needed to Take Care of Personal Grooming Such as Brushing Teeth  4     Assistance Needed to Eat Meals  4     AM-PAC Daily Activity Total Score  18     AMP-PAC Daily Activity Impairment rating   Score 14-19 - 40-59% impaired     Row Name 11/03/19 1100          Objective    Overall Cognitive Status  Intact - no cognitive limitations or impairments noted     Communication  No communication limitations or impairments noted. Current status of hearing, speech and vision allow functional communication.     Coordination/Motor control  No limitations or impairments noted. Movement patterns are fluid and coordinated throughout     Balance  Balance limitations present     Static Sitting Balance  Good - able to maintain balance without handhold support, limited postural sway     Dynamic Sitting Balance  Good - accepts moderate challenge, able to maintain balance while picking object off floor     Static Standing Balance  Not tested     Dynamic Standing Balance  Not tested     Extremity Assessment  Flexibility, strength, muscle tone and sensation grossly within functional limits throughout BUEs [facility]     Functional Mobility  Functional mobility deficits present     Bed Mobility  Supervised     Bed Mobility Comments  instructed in log roll technqiue     Transfers to/from Stand  Supervised CGA     Transfer Comments  Completes 2 STS tranfers, unable to tolerate standing for >3seconds prior to needing to sit down, limited by pain      Other Objective Findings  Patient found supine in bed, agreeable to session. Educated on spine precautions and log roll technique for LB dressing. Able to complete supine >sit with SUP. Educated on Lumbar corset wearing protocol, able to don with cuing and supervision.  Attempts 2 stands however c/o significant pain increase and returns to R sidelying in bed. BP stable supine and seated 114/72. Left as found, in NAD.          OT Acute Tool Box     Row Name 11/03/19 1100          Cognition Assessment    Overall Cognitive Status  Intact - no cognitive limitations or impairments noted             Eval cont.     [place] Name 11/03/19 1100          Patient/Family Education    Learner(s)  Patient      Learner response to rehab patient education interventions  Verbalizes understanding;Able to return demonstrate teaching     [place] Name 11/03/19 1100          Assessment    Assessment  Patient seen for OT EVAL, tolerates well. Educated on role of OT, OT POC, spine precautions and brace wearing procotol. Patient limited to EOB activity this date (corset training and sit to stand trials) due to pain. Further limited by decreased endurance, decreased strength. Will benefit from skilled IP OT to address deficits in order to maximize safety/IND with OOB ADLs/mobility. Anticipate home with mother support once IP OT goals met and pt medically cleared. Has shower chair as DME.      [place] Name 11/03/19 1100          Patient stated Goal    Patient stated goal  Less pain     Row Name 11/03/19 1100          Goal 1 (Short Term)    Impairment  Activities of Daily Living - Upper Body Dressing     Activities of Daily Living - Upper Body Dressing  Patient able to perform and complete upper body dressing at modified independent/independent level Lumbar corset     Number of visits  2     Goal Status  New     Row Name 11/03/19 1100          Goal 2 (Short Term)    Impairment  Activities of Daily Living - Lower Body Dressing     Activities of Daily Living - Lower Body Dressing  Patient able to perform and complete lower body dressing at modified independent/independent level SUP level     Number of visits  2     Goal Status  [place] Name 11/03/19 1100          Goal 3 (Short Term)    Impairment  Functional ability/mobility - Toileting     Custom goal  Toilet transfer at [place] level      Number of visits  2     Goal [place] Name 11/03/19 1100          Goal 4 (Short Term)    Impairment  -     Row Name 11/03/19 1100          GOAL (Long Term)    Long Term Goal Impairment  Activities of Daily Living - Toileting     Activities of Daily Living - Toileting  Patient able to perform toileting activities at  modified independent/independent  level     Long Term Goal Number of visits  [place] Name 11/03/19 1100  Planned Therapy Interventions and Rationale    Neuromuscular Re-Education  to improve safety during dynamic activities     Patient Education  to improve energy conservation measures during functional activities;to increase independence with correct body mechanics technique during functional activities;to increase safety during dynamic activities;to increase knowledge of precautions to prevent/minimize complications of condition;to increase independence in functional activities     Self-Care/ADL Training  to improve energy conservation measures during functional activities;to improve independence with compensatory strategies;to improve home safety;to improve safety when completing daily activities and self care     Therapeutic Activities  to improve ability to perform self care and ADL's;to improve transfers between surfaces     Row Name 11/03/19 1100          Treatment Plan Disussion    Treatment Plan Discussion and Agreement  Patient support system determined and all questions were asked and answered;Patient/family/caregiver stated understanding and agreement with the therapy plan     Row Name 11/03/19 1100          Treatment Plan    Continue therapy to address  Decline in performance of activities of daily living (ADL);Decline in functional ability/mobility;Activity tolerance limitation     Frequency of treatment  6 times per week     Duration of treatment (number of visits)  Treatment will continue while in hospital and in need of skilled therapy services     Status of treatment  Patient evaluated and will benefit from ongoing skilled therapy     Row Name 11/03/19 1100          Patient Safety Considerations    Patient safety considerations  Call light left in reach and fall precautions in place;Patient returned to bed at end of treatment;Nursing notified of safety considerations at end of  treatment;Patient may be at risk for falls     Row Name 11/03/19 [place] Acute Discharge Recommendations    Discharge Rehabilitation Reccomendations ([place])  None- patient currently  has no further skilled therapy needs     Equipment recommendations  To be determined as patient progresses in therapy     Row Name 11/03/19 1100          Therapy Plan Communication    Therapy Plan Communication  Discussed therapy plan with Nursing and/or Physician;Discussed therapy plan and patient's mobility status with Case Manager     [place] Name 11/03/19 1100          Occupational Therapy Patient Discharge Instructions    Your Occupational Therapist suggests the following  Continue to complete your self care Activities of Daily Living as frequently as possible;Continue to follow your prescribed mobility precautions when transferring to the chair and toilet as instructed;Continue to use energy conservation, pursed lip breathing and self-pacing when completing your self care Activities of Daily Living;Supervision is suggested when you     Supervision is suggested when you  bath;toilet;dress     Row Name 11/03/19 1100          Type of Eval    Low Complexity [phone])  Completed     Row Name 11/03/19 1100          Therapeutic Procedures    Therapeutic Activities (91694)  Assistance/facilitation of bed mobility;Progressive mobilization to improve functional independence;Patient education         Total TIMED Treatment (min)  15     Self-Care/ADL Training [phone])  Dressing;Grooming;Compensatory training;Activities of daily living training;Personal hygiene;Patient education;Self-care  activities of dally living;Safety procedures         Total TIMED Treatment (min)  15           The occupational therapist of record is endorsed by evaluating occupational therapist.

## 2019-11-03 NOTE — Addendum Note (Signed)
Addendum  created 11/03/19 0338 by Bernita Raisin, MD    Order list changed

## 2019-11-03 NOTE — RN OR/Procedure Note (Signed)
Pt tearful very restless c/o severe pain, medicated with dilaudid and fentanyl as ordered with no relief per pt.   Anesthesia at bedside medicating pt for pain.   Will continue to monitor and treat for pain as needed.

## 2019-11-03 NOTE — Anesthesia Postprocedure Evaluation (Signed)
Anesthesia Post Note    Patient: Haley Barrett    Procedure(s) Performed: Procedure(s):  exploration of fusion lumbar 4 - lumbar 5, removal of hardware, revision fusion, decompression      Final anesthesia type: General    Patient location: PACU    Post anesthesia pain: adequate analgesia    Mental status: awake, alert  and oriented    Airway Patent: Yes    Last Vitals:   Vitals Value Taken Time   BP 98/47 11/03/19 0115   Temp 36.2 C 11/03/19 0115   Pulse 56 11/03/19 0126   Resp 12 11/03/19 0126   SpO2 100 % 11/03/19 0126   Vitals shown include unvalidated device data.     Post vital signs: stable    Hydration: adequate    N/V:no    Anesthetic complications: no    Plan of care per primary team.

## 2019-11-04 ENCOUNTER — Other Ambulatory Visit: Payer: Self-pay

## 2019-11-04 LAB — HEMOGRAM, BLOOD
Hct: 38.3 % (ref 34.0–45.0)
Hgb: 12.6 gm/dL (ref 11.2–15.7)
MCH: 30.4 pg (ref 26.0–32.0)
MCHC: 32.9 g/dL (ref 32.0–36.0)
MCV: 92.3 um3 (ref 79.0–95.0)
MPV: 9.2 fL — ABNORMAL LOW (ref 9.4–12.4)
Plt Count: 299 10*3/uL (ref 140–370)
RBC: 4.15 10*6/uL (ref 3.90–5.20)
RDW: 12.4 % (ref 12.0–14.0)
WBC: 14.7 10*3/uL — ABNORMAL HIGH (ref 4.0–10.0)

## 2019-11-04 LAB — MRSA SURVEILLANCE CULTURE

## 2019-11-04 MED ORDER — NALOXONE HCL 4 MG/0.1ML NA LIQD
NASAL | 3 refills | Status: DC
Start: 2019-11-04 — End: 2019-12-07
  Filled 2019-11-04: qty 2, 1d supply, fill #0

## 2019-11-04 MED ORDER — DOCUSATE SODIUM 250 MG OR CAPS
250.0000 mg | ORAL_CAPSULE | Freq: Every evening | ORAL | 0 refills | Status: DC
Start: 2019-11-04 — End: 2019-11-04
  Filled 2019-11-04: qty 60, 60d supply, fill #0

## 2019-11-04 MED ORDER — LIDOCAINE 4 % EX PTCH
2.0000 | MEDICATED_PATCH | CUTANEOUS | 0 refills | Status: DC | PRN
Start: 2019-11-04 — End: 2019-11-04
  Filled 2019-11-04: qty 10, 5d supply, fill #0

## 2019-11-04 MED ORDER — DOCUSATE SODIUM 250 MG OR CAPS
250.0000 mg | ORAL_CAPSULE | Freq: Every evening | ORAL | 0 refills | Status: AC
Start: 2019-11-04 — End: ?

## 2019-11-04 MED ORDER — HYDROMORPHONE HCL 1 MG/ML IJ SOLN
0.5000 mg | INTRAMUSCULAR | Status: DC | PRN
Start: 2019-11-04 — End: 2019-11-04
  Administered 2019-11-04: 0.5 mg via INTRAVENOUS

## 2019-11-04 MED ORDER — SENNA 8.6 MG OR TABS
17.2000 mg | ORAL_TABLET | Freq: Every morning | ORAL | 0 refills | Status: DC
Start: 2019-11-04 — End: 2019-12-07

## 2019-11-04 MED ORDER — OXYCODONE HCL 10 MG OR TABS
10.0000 mg | ORAL_TABLET | ORAL | 0 refills | Status: AC | PRN
Start: 2019-11-04 — End: ?

## 2019-11-04 MED ORDER — TIZANIDINE HCL 4 MG OR TABS
4.0000 mg | ORAL_TABLET | Freq: Four times a day (QID) | ORAL | 0 refills | Status: DC | PRN
Start: 2019-11-04 — End: 2019-11-04
  Filled 2019-11-04: qty 20, 5d supply, fill #0

## 2019-11-04 MED ORDER — SENNA 8.6 MG OR TABS
17.2000 mg | ORAL_TABLET | Freq: Every morning | ORAL | 0 refills | Status: DC
Start: 2019-11-04 — End: 2019-11-04
  Filled 2019-11-04: qty 60, 30d supply, fill #0

## 2019-11-04 MED ORDER — LIDOCAINE 4 % EX PTCH
2.0000 | MEDICATED_PATCH | CUTANEOUS | 0 refills | Status: AC | PRN
Start: 2019-11-04 — End: ?

## 2019-11-04 MED ORDER — OXYCODONE HCL 10 MG OR TABS
10.0000 mg | ORAL_TABLET | ORAL | 0 refills | Status: DC | PRN
Start: 2019-11-04 — End: 2019-11-04
  Filled 2019-11-04: qty 120, 15d supply, fill #0

## 2019-11-04 MED ORDER — TIZANIDINE HCL 4 MG OR TABS
4.0000 mg | ORAL_TABLET | Freq: Four times a day (QID) | ORAL | 0 refills | Status: AC | PRN
Start: 2019-11-04 — End: 2020-10-29

## 2019-11-04 MED ORDER — INFLUENZA VAC SPLIT QUAD 0.5 ML IM SUSY
0.5000 mL | PREFILLED_SYRINGE | INTRAMUSCULAR | Status: DC
Start: 2019-11-04 — End: 2019-11-04

## 2019-11-04 NOTE — Interdisciplinary (Signed)
Occupational Therapy Discharge Summary    Admitting Physician:  Zlomislic, Vinko, MD  Admission Date 11/02/2019    Inpatient Diagnosis:   Problem List       Codes    Pain     ICD-10-CM: R52  ICD-9-CM: 780.96    Relevant Orders    X-Ray Fluoroscopy Up To 1 Hr - OR (Completed)    Decreased activities of daily living (ADL)     ICD-10-CM: Z78.9  ICD-9-CM: V49.89    Difficulty walking     ICD-10-CM: R26.2  ICD-9-CM: 719.7          IP Start of Service  Start of Care: 11/03/19  Reason for referral: Decline in functional ability/mobility;Decline in performance of activities of daily living (ADL);Activity tolerance limitation    Preferred Language:English         Past Medical History:   Diagnosis Date   . ADD (attention deficit disorder)    . Clavicular fracture       Past Surgical History:   Procedure Laterality Date   . BACK SURGERY  07/28/2018    In [place]   . HUMERUS FRACTURE SURGERY Left 1993   . ------------OTHER-------------  39 yo    humerus ORIF    . SPINE SURGERY  July 28, 2018, April 2020       OT Acute     Row Name 11/04/19 0900          Type of Visit    Type of Occupational Therapy note  Occupational Therapy Discharge Summary     Row Name 11/04/19 0900          Treatment Time    Treatment Start Time  0910     Total TIMED Treatment (min)  30 23     Total Treatment Time (min)  23     Row Name 11/04/19 0900          Treatment Precautions/Restrictions    Precautions/Restrictions  Fall;Spine     Other Precautions/Restrictions Information  lumbar corset for [place] Name 11/04/19 0900          Functional History    Prior Level of Function  Minimal deficits     General ADL/Self-Care Assistance Needs  Independent with ADLs and self care using adaptive device/equipment     Self Care Equipment/Device(s) in the home  Shower chair/bench     Equipment required for mobility in the home  None     Row Name 11/04/19 0900          Social History    Living Situation  Lives alone Mother staying post-acute to assist with  needs     [place] accessibility  Stairs present;Accessible with wheelchair or walker Elevator available     Number of steps to enter home  - 2 flights     Number of steps within home  0     Bathroom accessibility  Tub/shower;Shower/tub seat     Row Name 11/04/19 0900          Subjective    Subjective information  "I'm feeling better, the pain is there still tho"     Patient status  Patient agreeable to treatment;Nursing in agreement for treatment     Row Name 11/04/19 0900          Pain Assessment    Pain Asssessment Tool  Numeric Pain Rating Scale     Row Name 11/04/19 0900  Numeric Pain Rating Scale    Pain Intensity - rating at present  8     Pain Intensity- rating after treatment  8     Location  spine incision     Row Name 11/04/19 0900          Activities of Daily Living (ADLs)    Self Grooming  Independent     Other Self Grooming Information  standing sinkside     Upper Body Dressing  Independent     Other Upper Body Dressing Information  to don/doff Lumbar corset     Lower Body Dressing  Modified independent     Other Lower Body Dressing Information  to don socks via [place] Name 11/04/19 0900          [place] AM-PAC: Daily Activity    Assistance Needed to Put on and Take off Regular Lower Body Clothing  4     Assistance Needed to Bathe, Including Washing, Rinsing, and Drying  3     Assistance Needed to Toilet Environmental manager, Bedpan, or Urinal)  4     Assistance Needed to Put on and Take off Regular Upper Body Clothing  4     Assistance Needed to [place] Such as Brushing Teeth  4     Assistance Needed to Eat Meals  4     AM-PAC Daily Activity Total Score  23     AMP-PAC Daily Activity Impairment rating  Score 23 - 1-19% impaired     Row Name 11/04/19 0900          Objective    Overall Cognitive Status  Intact - no cognitive limitations or impairments noted     Communication  No communication limitations or  impairments noted. Current status of hearing, speech and vision allow functional communication.     Coordination/Motor control  No limitations or impairments noted. Movement patterns are fluid and coordinated throughout     Balance  Balance limitations present     Static Sitting Balance  Good - able to maintain balance without handhold support, limited postural sway     Dynamic Sitting Balance  Good - accepts moderate challenge, able to maintain balance while picking object off floor     Static Standing Balance  Good - able to maintain balance without handhold support, limited postural sway     Dynamic Standing Balance  Good - accepts moderate challenge, able to maintain balance while picking object off floor     Extremity Assessment  Flexibility, strength, muscle tone and sensation grossly within functional limits throughout BUEs [facility]     Functional Mobility  Functional mobility deficits present     Bed Mobility  Modified independent     Bed Mobility Comments  via log roll     Transfers to/from Stand  Independent     Ambulation during functional tasks  Independent     Device used for ambulation/mobility  Front wheeled walker;None     Ambulation Distance  ambulates in room without AD, no LOB, uses AD in hallway to reduce pressure through spine     Other Objective Findings  Able to recall all spine precautions and manage LB/UB dressing at MOD I to IND level. Completes toilet transfers, standing ADLs and functional mobility at MOD I to IND level. No AD for ambulating necessary but pt feels more comfortable using FWW to reduce pressure during walking. Educated on  use of shower chair for safe bathing, pt has DME.          OT Acute Tool Box     Row Name 11/04/19 0900          Cognition Assessment    Overall Cognitive Status  Intact - no cognitive limitations or impairments noted             Eval cont.     [place] Name 11/04/19 0900          Patient/Family Education    Learner(s)  Patient     Learner response to rehab patient  education interventions  Verbalizes understanding;Able to return demonstrate teaching     [place] Name 11/04/19 0900          Assessment    Assessment  Patient tolerates OT tx session well. Able to progress to IND/MOD I level for all ADL and functional mobility since eval. Continues to be limited by pain. Aware of precautions and brace wearing protocol. No further IP OT needs. Safe to d/c home with mothers support prn. No DME req.      [place] Name 11/04/19 0900          Patient stated Goal    Patient stated goal  Less pain     Row Name 11/04/19 0900          Goal 1 (Short Term)    Impairment  Activities of Daily Living - Upper Body Dressing     Activities of Daily Living - Upper Body Dressing  Patient able to perform and complete upper body dressing at modified independent/independent level Lumbar corset     Goal Status  Met     Row Name 11/04/19 0900          Goal 2 (Short Term)    Impairment  Activities of Daily Living - Lower Body Dressing     Activities of Daily Living - Lower Body Dressing  Patient able to perform and complete lower body dressing at modified independent/independent level SUP level     Goal Status  Met     Row Name 11/04/19 0900          Goal 3 (Short Term)    Impairment  Functional ability/mobility - Toileting     Custom goal  Toilet transfer at SUP level      Goal Status  Met     Row Name 11/04/19 0900          GOAL (Long Term)    Long Term Goal Impairment  Activities of Daily Living - Toileting     Activities of Daily Living - Toileting  Patient able to perform toileting activities at  modified independent/independent level     Long Term Goal Status  Met     Row Name 11/04/19 0900          Treatment Plan Disussion    Treatment Plan Discussion and Agreement  Patient support system determined and all questions were asked and answered;Patient/family/caregiver stated understanding and agreement with the therapy plan     Row Name 11/04/19 0900          Treatment Plan    Frequency of treatment  Other  (comments)     Duration of treatment (number of visits)  Other (comments)     Status of treatment  Patient appropriate for discharge from therapy     [place] Name 11/04/19 0900          Patient Safety Considerations  Patient safety considerations  Call light left in reach and fall precautions in place;Nursing notified of safety considerations at end of treatment;Patient may be at risk for falls     Row Name 11/04/19 0900          Post Acute Discharge Recommendations    Discharge Rehabilitation Reccomendations ([place])  None- patient currently  has no further skilled therapy needs     Equipment recommendations  No equipment needed - patient has own equipment     [place] Name 11/04/19 0900          Therapy Plan Communication    Therapy Plan Communication  Discussed therapy plan with Nursing and/or Physician;Discussed therapy plan and patient's mobility status with Case Manager     [place] Name 11/04/19 0900          Occupational Therapy Patient Discharge Instructions    Your Occupational Therapist suggests the following  Continue to complete your self care Activities of Daily Living as frequently as possible;Continue to follow your prescribed mobility precautions when transferring to the chair and toilet as instructed;Continue to use energy conservation, pursed lip breathing and self-pacing when completing your self care Activities of Daily Living;Supervision is suggested when you     Supervision is suggested when you  bath     Row Name 11/04/19 0900          Therapeutic Procedures    Self-Care/ADL Training [phone])  Dressing;Grooming;Compensatory training;Activities of daily living training;Personal hygiene;Patient education;Self-care activities of dally living;Safety procedures         Total TIMED Treatment (min)  23           The occupational therapist of record is endorsed by evaluating occupational therapist.

## 2019-11-04 NOTE — Interdisciplinary (Signed)
11/04/19 1717   Discharge Plan   Living Arrangements * Alone   Patient/Family/Other Engaged in Discharge Planning * Yes   Name, Relationship and Phone Number of Person Engaged in the Discharge Plan Patient   Family/Caregiver's Assessed for * Readiness, willingness, and ability to provide or support self-management activities;Readiness to provide care to the patient   Respite Care * Not Applicable   Patient/Family/Other Are In Agreement With Discharge Plan * Yes   Patient Has Decision Making Capacity * Yes   Patient/Family/Legal/Surrogate Decision Maker Has Been Given a List Options And Choice In The Selection of Post-Acute Care Providers * Yes  (No prefernce)   CM discussed the following with pt, and/or family, and/or DPOA [place] has agreements with select post-acute care providers in the collaborative care network;If patient has chosen [employer] or [facility] for post-acute care, they were informed that we partner with, and have a financial interest in these organizations   Discharge Transportation   Transportation *  Family/Friend   Final Discharge Destination/Services   Final Discharge Destination/Services * Home;Home Health   Home Health/Home Infusion   Home Health Care Agency * CM will contact you with acepting Home Health     Pt will go home today. Pt is ware that there is no accepting HH at this time and CM will expand [facility] search.  CM will contact pt with accepting [facility] tomorrow. Pt states, she has FWW at home. Pt agreed to DCP.

## 2019-11-04 NOTE — Progress Notes (Signed)
Orthopedics Spine Progress Note  11/04/2019    Patient ID:  Name: Haley Barrett  MRN: [id number]  DOB: 1980/11/03    Procedures:  DOS 11-02-19  1. Removal of posterior instrumentation at L4-5  2. In situ fusion L4-5 with allograft and BMP    Subjective:  Doing well, transition pca to orals but pt still requiring rescue IV dose, pt not comfortable going home due to pain. Denies new weakness/paresthesias/denies CP/SOB. No complaints    Objective:  Vital Signs:  BP 110/80   Pulse 66   Temp 97.7 F (36.5 C)   Resp 17   Ht 5' 10.9" (1.801 m)   Wt 81.1 kg (178 lb 11.2 oz)   SpO2 100%   BMI 24.99 kg/m     Physical Exam:  General: patient awake, alert, and responding to commands; no apparent distress  Cardio: regular rate and rhythm per pulse  Respiratory: patient breathing comfortably without use of accessory muscles  Back: dressings in place, clean/dry/intact    Bilateral Lower Extremity   Motor:       Right           Left        Iliopsoas (L2/3)                     5/5           5/5         Quadriceps (L4)                    5/5           5/5        Tibialis Anterior (L4/5)           5/5           5/5        EHL (L5)                               5/5           5/5        GSC (S1)                              5/5           5/5   Sensation:        SILT in L2-S1 distributions.   Vascular Exam:        Warm and well perfused distally    Laboratory Data:   Lab Results   Component Value Date    WBC 14.7 (H) 11/04/2019    HGB 12.6 11/04/2019    HCT 38.3 11/04/2019    PLT 299 11/04/2019     Lab Results   Component Value Date    NA 139 11/03/2019    K 4.3 11/03/2019    CL 105 11/03/2019    BICARB 25 11/03/2019    BUN 5 (L) 11/03/2019    CREAT 0.78 11/03/2019    GLU 134 (H) 11/03/2019    [place] 9.0 11/03/2019     No results found for: INR, PTT      Input/Output:    Intake/Output Summary (Last 24 hours) at 11/04/2019 1258  Last data filed at 11/04/2019 1233  Gross per 24 hour   Intake 1350 ml   Output -   Net 1350 ml  Current  Medications:  Current Facility-Administered Medications   Medication   . acetaminophen (TYLENOL) tablet 975 mg   . ascorbic acid (VITAMIN C) tablet 500 mg   . bisacodyl (DULCOLAX) suppository 10 mg   . diazepam (VALIUM) tablet 5 mg   . docusate sodium (COLACE) capsule 250 mg   . famotidine (PEPCID) IVPB 20 mg    Or   . famotidine (PEPCID) tablet 20 mg   . gabapentin (NEURONTIN) capsule 300 mg   . HYDROmorphone (DILAUDID) injection 0.5 mg   . lactated ringers infusion   . lidocaine (ASPERCREME) 4 % patch 2 patch   . magnesium hydroxide (MILK OF MAGNESIA) suspension 30 mL   . menthol (CEPACOL) lozenge 3 mg   . multivitamin tablet 1 tablet   . nalOXone (NARCAN) injection 0.1 mg   . ondansetron (ZOFRAN) injection 4 mg   . oxyCODONE (ROXICODONE) tablet 10 mg   . oxyCODONE (ROXICODONE) tablet 15 mg   . oxyCODONE (ROXICODONE) tablet 5 mg   . polyethylene glycol (MIRALAX) packet 17 g   . psyllium (METAMUCIL) 58.12 % packet 1 packet   . senna (SENOKOT) tablet 17.2 mg   . tiZANidine (ZANAFLEX) tablet 4 mg   . zolpidem (AMBIEN) tablet 5 mg       Assessment:   39 year old female 2 Days Post-Op s/p above procedures. Patient doing well and progressing appropriately. Pain issues, dc tomorrow   - Acute Blood Loss Anemia with component of hemodilution    Plan:  - PT: mob as tolerated, lumbar corset prn for comfort  - Antibiotics: complete peri-operative ancef  - DVT Prophylaxis: no pharmacologic prophylaxis; SCDs/TED hose, ambulation  - Pain Medication: multimodal  - Diet: regular  - Post op imaging: complete  - Other: aggressive incentive spirometry  - Dispo: pending pain control on oral pain medication, CM clearance      Marilynn Rail, MD  Orthopaedic Spine Fellow      Please page the Orthopaedic Spine Surgery Team with questions or concerns based on patient location:     Established spine floor patients at [place]: Ortho Spine 1 - [phone]      Established spine floor patients at [facility]: Ortho Spine 2 - [phone]       New spine consultations not yet followed by spine team:    [facility] on call for "SPINE CONSULT" call schedule (Orthopaedic Spine versus Neurosurgery)   If Orthopaedic Spine is on call please page the PRIMARY CONSULT RESIDENT on call:   [place]: ORTHOPEDICS/TH   [place]: ORTHOPEDICS/HC

## 2019-11-04 NOTE — Interdisciplinary (Signed)
11/04/19 1607   Provider Notification   Provider Notified Physician   Provider Name 1st call    Method of Communication Text Page   Reason for Communication [address]: The patient would like to go home today. If indicated dc order? she also requested refill for her Zanaflex. Thank you    Provider Response awaiting

## 2019-11-04 NOTE — Plan of Care (Signed)
Problem: Promotion of Health and Safety  Goal: Promotion of Health and Safety  Description: The patient remains safe, receives appropriate treatment and achieves optimal outcomes (physically, psychosocially, and spiritually) within the limitations of the disease process by discharge.    Information below is the current care plan.  Flowsheets  Taken 11/04/2019 1726  Guidelines: Inpatient Nursing Guidelines  Individualized Interventions/Recommendations #1: Assessed for pain and instructed to call for pain medication as needed.  Individualized Interventions/Recommendations #2 (if applicable): Patient encouraged to ambulate.  Individualized Interventions/Recommendations #3 (if applicable): All scheduled meds given  Outcome Evaluation (rationale for progressing/not progressing) every shift: Patient remained alert and oriented, OOB this morning in the hallway and ambulated with PT, back incision area remained clean, pain is controlled with po and iv meds, patient up to the bathroom and voiding well, she want to go home this late afternoon and text sent to on call HO and patient will be going to home this late afternoon. she is under no acute distress at this time.  Taken 11/04/2019 0720  Patient /Family stated Goal: pain control

## 2019-11-04 NOTE — Interdisciplinary (Signed)
Physical Therapy Discharge Summary    Admitting Physician:  Zlomislic, Vinko, MD  Admission Date 11/02/2019    Inpatient Diagnosis:   Problem List       Codes    Pain     ICD-10-CM: R52  ICD-9-CM: 780.96    Relevant Orders    X-Ray Fluoroscopy Up To 1 Hr - OR (Completed)    Decreased activities of daily living (ADL)     ICD-10-CM: Z78.9  ICD-9-CM: V49.89    Difficulty walking     ICD-10-CM: R26.2  ICD-9-CM: 719.7          IP Start of Service   Start of Care: 11/03/19  Onset Date: 11/02/2019  Reason for referral: Activity tolerance limitation;Decline in functional ability/mobility    Preferred [place]         Past Medical History:   Diagnosis Date   . ADD (attention deficit disorder)    . Clavicular fracture       Past Surgical History:   Procedure Laterality Date   . BACK SURGERY  07/28/2018    In [place]   . HUMERUS FRACTURE SURGERY Left 1993   . ------------OTHER-------------  39 yo    humerus ORIF    . SPINE SURGERY  July 28, 2018, April 2020       PT Acute     Row Name 11/04/19 1500          Type of Visit    Type of Physical Therapy note  Physical Therapy Discharge Summary     Row Name 11/04/19 1500          Treatment Precautions/Restrictions    Precautions/Restrictions  Fall;Spine     Fall  Socks/charm     Other Precautions/Restrictions Information  LSO when OOB for mobility     Row Name 11/04/19 1500          Medical History    History of presenting condition  Patient is POD #1 L4-5 fusion hardware removal.     Fall history  No falls reported in the last 6 months     [place] Name 11/04/19 1500          Subjective    Subjective Information  "I don't think this is a good idea."     Patient status  Patient agreeable to treatment;Nursing in agreement for treatment     [place] Name 11/04/19 1500          Pain Assessment    Pain Asssessment Tool  Numeric Pain Rating Scale     Row Name 11/04/19 1500          Numeric Pain Rating Scale    Pain Intensity - rating at present  10     Pain Intensity- rating after  treatment  10     Location  low back     Row Name 11/04/19 1500          Objective    Overall Cognitive Status  Intact - no cognitive limitations or impairments noted     Communication  No communication limitations or impairments noted. Current status of hearing, speech and vision allow functional communication.     Coordination/Motor control  No limitations or impairments noted. Movement patterns are fluid and coordinated throughout     Static Sitting Balance  Good - able to maintain balance without handhold support, limited postural sway     Dynamic Sitting Balance  Good - accepts moderate challenge, able to maintain balance while picking object off floor  Static Standing Balance  Good - able to maintain balance without handhold support, limited postural sway     Dynamic Standing Balance  Good - accepts moderate challenge, able to maintain balance while picking object off floor     Other Balance Information  with FWW     Bed Mobility  Independent     Bed Mobility Comments  log roll to R      Transfers to/from Stand  Independent     Transfer Comments  Patient is able to perform sit to stand from bed with FWW     Gait  Supervised     Gait Comments  Patient is able to ambulate 100' with FWW and supervision. Patient demonstrated step through gait pattern with B heel strike. Patient unable to progress with gait distance 2/2, RN notified     Device used for ambulation/mobility  Front wheeled walker     Ambulation Distance  100'     Other Objective Findings  Patient was able to don/doff TLSO indpendently and recall all spinal precautions               Eval cont.     [place] Name 11/04/19 1500          [place] AM-PAC: Basic Mobility    Assistance Needed to Turn from Back to Side While in a Flat Bed Without Using Bedrails  3 - A little (supervised/min assist)     Difficulty with Supine to Sit Transfer  3 - A little (supervised/min assist)     How Much Help Needed to Move to/from Bed to Chair  3 - A little (supervised/min  assist)     Difficulty with Sit to Stand Transfer from Chair with Arms  3 - A little (supervised/min assist)     How Much Help Needed to Walk in Room  3 - A little (supervised/min assist)     How Much Help Needed to Climb 3-5 Steps with a Rail  3 - A little (supervised/min assist)     AMPAC Total Score  18     Assessment: AM-PAC Basic Mobility Impairment Rating  Score 13-18 - 40-59% impaired     Row Name 11/04/19 1500          Patient/Family Education    Learner(s)  Patient     Learner response to rehab patient education interventions  Verbalizes understanding;Able to return demonstrate teaching     Patient/family training comments  PT role, POC and DC plan.      [place] Name 11/04/19 1500          Assessment    Assessment  Patient seen for skilled IP PT treatment session. Patient is able to ambulate 100' with FWW and supervision. Patient is only limited with progress by pain at this point and no further IP PT is indicated at this time. Patient to DC home with FWW and [facility] PT once medically stable. Recommend pt OOb to chair 3x/day and ambulate with FWW 2/2 pain.      Rehab Potential  Good     Row Name 11/04/19 1500          Patient stated Goal    Patient stated goal  to have less pain     Row Name 11/04/19 1500          Goal 1 (Short Term)    Functional mobility  Patient able to consistently complete bed mobility skills with no more than minimum assistance     Custom  goal  with log roll     Goal Status  Met     Row Name 11/04/19 1500          Goal 2 (Short Term)    Functional mobility  Patient able to consistently complete sit to stand transfer safely between surfaces with no more than minimum assistance     Goal Status  Met     Row Name 11/04/19 1500          Goal 3 (Short Term)    Gait  Patient able to consistently ambulate household distances with least restrictive assistive device with no more than supervision assistance     Goal Status  Met     Row Name 11/04/19 1500          Planned Therapy Interventions and  Rationale    Gait Training  to normalize gait pattern and improve safety while ambulating;to normalize gait pattern and improve safety while ambulating with assistive device     Neuromuscular Re-Education  to improve safety during dynamic activities;to normalize muscle tone and coordination     Therapeutic Activities  to improve functional mobility and ability to navigate in the home and/or community;to improve transfers between surfaces     Theraputic Exercise  to increase range of motion to allow greater independence with functional mobility skills;to increase strength to allow greater independence with functional mobility skills     Row Name 11/04/19 1500          Treatment Plan Disussion    Treatment Plan Discussion and Agreement  Patient support system determined and all questions were asked and answered;Patient/family/caregiver stated understanding and agreement with the therapy plan     Row Name 11/04/19 1500          Treatment Plan    Frequency of treatment  Patient appropriate for discharge from therapy     Status of treatment  Patient appropriate for discharge from therapy     [place] Name 11/04/19 1500          Patient Safety Considerations    Patient safety considerations  Patient returned to bed at end of treatment;Call light left in reach and fall precautions in place;Patient left  in appropriate pressure relieving position;Nursing notified of safety considerations at end of treatment;Patient may be at risk for falls     Patient assistive device requirements for safe ambulation  [place] Name 11/04/19 1500          Therapy Plan Communication    Therapy Plan Communication  Discussed therapy plan with Nursing and/or Physician;Encouraged out of bed with assistance by     Encouraged out of bed with assistance by  Nursing     Row Name 11/04/19 1500          Physical Therapy Patient Discharge Instructions    Your Physical Therapist suggests the following  Supervision with walking is suggested for  increased safety;Continue to use your assistive device as instructed when walking to improve your stability and prevent falls;Continue to complete your home exercise program daily as instructed     Row Name 11/04/19 1500          Therapeutic Procedures    Gait Training [phone])  Assistive device training;Dynamic activities while walking;Gait training with varying resistance or speed;Gait pattern analysis and treatment of deviations;Muscle facilitation to address gait deviation(s);Postural alighnment/biomechanic training during gait;Patient education;Stair/curb/obstacle navigation training        Total TIMED Treatment (min)   15  Therapeutic Activities [phone])   Assistance/facilitation of bed mobility;Functional activities;Transfer training with weight shift and direction change;Weight shift activities to improve safety in unsupported sitting or standing;Progressive mobilization to improve functional independence;Patient education        Total TIMED Treatment (min)   15     Row Name 11/04/19 1500          Treatment Time     Total TIMED Treatment  (min)  30     Total Treatment Time (min)  30     Treatment start time  1400         Post Acute Discharge Recommendations  Discharge Rehabilitation Reccomendations ([place]): If medically appropriate and available, patient demonstrates tolerance to participate in skilled therapy at the following anticipated level  Therapy level: Home health;Outpatient  Equipment recommendations: Loyal Buba Justification: Patient safety and mobility is enhanced by the use of a walker. Patient has indicated agreement to utilize the walker during Mobility Related Activities of Daily Living (MRADLs) and is able to complete MRADLs in a more timely manner using a walker.    The physical therapist of record is endorsed by evaluating physical therapist.

## 2019-11-04 NOTE — Interdisciplinary (Signed)
Patient given written and verbal discharge instructions for primary RN Aggie.    Instructions included sign & symptoms to report, wound care, discharge medications, diet, activity, and follow up appointments.   Extra supplies given for wound care.    Reviewed med rec of home medications with patient.   DC meds sent to [facility]. Patient advised of need to pick up Narcan from [facility] or ask [facility] for transfer of med, pt agrees.     Per Dr Delford Field verified with fellow who advised pt she could take 15 mg of oxycodone for increased pain and would need to cut in half. Pill cutter given to patient.   Copy of discharge instructions given to patient.  Patient verbalizes understanding for discharge, all questions addressed.    Influenza declined.     Patient discharged via w/c with CCP.

## 2019-11-04 NOTE — Plan of Care (Signed)
Problem: Promotion of Health and Safety  Goal: Promotion of Health and Safety  Description: The patient remains safe, receives appropriate treatment and achieves optimal outcomes (physically, psychosocially, and spiritually) within the limitations of the disease process by discharge.    Information below is the current care plan.  Flowsheets  Taken 11/04/2019 0254  Guidelines: Inpatient Nursing Guidelines  Individualized Interventions/Recommendations #1: cluster care to promote sleep  Individualized Interventions/Recommendations #2 (if applicable): Turn and reposition patient for comfort  Individualized Interventions/Recommendations #3 (if applicable): manage patient pain with current pain regimen  Individualized Interventions/Recommendations #4 (if applicable): encourage patient to wear brace when OOB  Outcome Evaluation (rationale for progressing/not progressing) every shift: Patient turns and repositions self independently in bed. Patient ambulates to bathroom using FWW. Patient got OOB a few times without calling for assistance. Reminded patient to call when getting OOB. Bed alarm on during this shift.  Medicated patient with scheduled and PRN pain medication. Ice pack applied to lower back.  Taken 11/03/2019 2006  Patient /Family stated Goal: pain control

## 2019-11-04 NOTE — Discharge Summary (Signed)
Patient Name:  Haley Barrett    Principal Diagnosis (required):    1. Chronic low back pain  2. S/p remote revision lumbar fusion L4-5    Hospital Problem List (required):    Patient Active Problem List   Diagnosis   . ADD (attention deficit disorder)   . Birth control   . Finger dislocation, subsequent encounter   . Chronic bilateral low back pain, with sciatica presence unspecified   . Lumbar spondylosis   . Lumbar disc disease with radiculopathy   . Degenerative spondylolisthesis   . Spinal stenosis of lumbar region with radiculopathy   . Back pain, unspecified back location, unspecified back pain laterality, unspecified chronicity   . Chronic bilateral low back pain with bilateral sciatica   . Spinal stenosis of lumbar region, unspecified whether neurogenic claudication present   . S/P lumbar fusion   . Gastroesophageal reflux disease without esophagitis   . Spondylolisthesis of lumbar region   . Sacroiliac joint dysfunction of both sides   . Chronic bilateral low back pain without sciatica   . Chronic midline low back pain, unspecified whether sciatica present       Additional Hospital Diagnoses ("rule out" or "suspected" diagnoses, etc.):  None    Principal Procedure During This Hospitalization (required):  1. Removal of retained instrumentation L4-5 consisting of screws and rods (Depuy synthes)  2. Exploration of fusion L4-5 (no motion)  3. Revision laminectomies L4-5 with removal of deep seroma  4. Posterior in situ fusion L4-5 with corticocancellous allograft chips, DBM (accel), rhBMP-2  5. Interpretation of intraoperative fluoroscopy    Other Procedures Performed During This Hospitalization (required):  None    Consultations Obtained During This Hospitalization:  PT  OT  CM    Key consultant recommendations:  Physical therapy regimen  Discharge disposition    Reason for Admission to the Hospital / History of Present Illness:  39 year old female returns to spine clinic now approximately 7 months s/p revision  lumbar fusion L4-5 as noted above. She states that she continues to have significant low back pain that is ongoing and limits her activities. At times she feels better, however she continues to have pain that is easily exacerbated by activities and states that she is at times unable to get out of bed as a results. She relates majority of her pain to the her low back but also complaints of generalized thoracolumbar back pain. She describes intermittent feelings of pain and weakness into bilateral lower extremities. She has no focal weakness, but describes feelings of her legs being unsteady at times.  She has undergone trial of PT, but that had resulted in exacerbation of her symptoms. Most recently, she had trial of si joint injections which did not provide her any relief of symptoms. She continues to take pain medications for pain control. To review briefly, she underwent wide lumbar laminectomy and instrumentation L4-5 in [place] about one year ago following which she continued to have severe pain, and ultimately proceeded with revision lumbar stabilization with fusion L4-5 as noted above. No issues with incisions. She has had multiple attempts at injections without any sustained relief. Patient reports no changes in bowel and bladder function. Review of prior imaging again shows intact instrumentation with near anatomic alignment, with slight residual spondylolisthesis, without evidence of severe stenosis. There is minimal lucency around pedicle screw at L4 without frank halo formation, likely related to reinstrumentation given revision surgery following an index instrumentation procedure in [place] that resulted in severe  disability and pain.  In addition graft does appear to be incorporating without evidence of subsidence or lucency.  She does continue to have symptoms of severe low back pain, which at times is near completely incapacitating, and which seems out of proportion to her imaging findings, and  continues to have intermittent radicular type complaints, though no evidence of stenosis on imaging, and therefore etiology of her symptoms is not entirely clear. At her index surgery in [place] there was complete resection of posterior elements violating the level at L3-4 and she has associated pain and "feelings of instability" at this level just above her fusion, however she has not had relief with injections.  We discussed options at length. We have discussed possibility of need to extend fusion to L3 though given the nonspecific nature of her low back pain and pain that does not correlate with her imaging findings, would not recommend further fusion level. She states that she is unable to engage in any PT because it causes too much pain and is interested in surgical address. We discussed possible revision fusion L4-5 with exploration of wound and evacuation of seroma with possible removal of instrumentation if fusion in tact and she does wish to proceed. We have discussed the planned procedure as well as r/b/a in detail. I have described the surgery / procedure in detail.  All the patient's questions were answered.     Hospital Course by Problem (required):  The patient was admitted and taken to the operating room for the planned procedure.  The procedure proceeded uneventfully and without complication - please see operative details for report.  Post-operatively the patient was admitted to the ward for recovery.  The post-operative plan  included the following:  Mechanical DVT prophylaxis  PO pain medication  Prophylactic antibiotics  Working with PT/OT    A hemogram was also performed and was consistent with expected acute blood loss anemia; the patient remained asymptomatic and stable throughout the hospitalization.     Upon discharge, the patient's pain was well controlled.  The patient was urinating without difficulty, and tolerating a PO diet.      Patient was ready for Discharge on POD#2.  Plan is for  follow-up in 2 weeks in clinic.     Tests Outstanding at Discharge Requiring Follow Up:  None    Discharge Condition (required):  Stable    Key Physical Exam Findings at Discharge:  General: patient awake, alert, and responding to commands; no apparent distress  Cardio: regular rate and rhythm per pulse  Respiratory: patient breathing comfortably without use of accessory muscles  Back: dressings in place, clean/dry/intact    Bilateral Lower Extremity              Motor:                                         Right           Left                   Iliopsoas (L2/3)                     5/5              5/5  Quadriceps (L4)                    5/5              5/5                   Tibialis Anterior (L4/5)           5/5             5/5                   EHL (L5)                               5/5              5/5                   GSC (S1)                              5/5              5/5              Sensation:                   SILT in L2-S1 distributions.              Vascular Exam:                   Warm and well perfused distally    Discharge Diet:  regular    Discharge Medications:     What To Do With Your Medications      START taking these medications      Add'l Info   docusate sodium 250 MG capsule  Commonly known as: COLACE  Take 1 capsule (250 mg) by mouth at bedtime.   Quantity: 60 capsule  Refills: 0     lidocaine 4 % patch  Commonly known as: ASPERCREME  Apply 2 patches topically every 24 hours as needed (pain). Leave patch on for 12 hours, then remove for 12 hours.   Quantity: 30 patch  Refills: 0     naloxone 4 mg/0.1 mL nasal spray  Commonly known as: NARCAN  For suspected opioid overdose, spray once in one nostril. CALL 911.  Repeat after 3 minutes if no or minimal response.   Quantity: 2 bottle  Refills: 3     oxyCODONE 10 MG tablet  Commonly known as: ROXICODONE  Take 1 tablet (10 mg) by mouth every 3 hours as needed for Moderate Pain (Pain Score 4-6).   Quantity: 120  tablet  Refills: 0     senna 8.6 MG tablet  Commonly known as: SENOKOT  Take 2 tablets (17.2 mg) by mouth every morning.   Quantity: 60 tablet  Refills: 0        CONTINUE taking these medications      Add'l Info   acetaminophen 325 MG tablet  Commonly known as: TYLENOL  Take 3 tablets (975 mg) by mouth every 8 hours.   Quantity: 90 tablet  Refills: 1     ALPRAZolam 0.25 MG tablet  Commonly known as: XANAX  take 1 tablet by mouth once daily if needed   Refills: 0     diazepam 5 MG tablet  Commonly known as: VALIUM  Take 1 tablet (5 mg) by mouth every 6 hours as needed for Insomnia or Muscle Spasms.   Quantity: 30 tablet  Refills: 0     gabapentin 300 MG capsule  Commonly known as: NEURONTIN  Take 300 mg by mouth 3 times daily.   Refills: 0     ondansetron 4 MG disintegrating tablet  Commonly known as: ZOFRAN ODT  Dissolve 1 tablet (4 mg) by mouth every 8 hours as needed for Nausea/Vomiting.   Quantity: 4 tablet  Refills: 0     polyethylene glycol 17 GM/SCOOP powder  Commonly known as: GLYCOLAX  17 g by Oral route.   Refills: 0     tiZANidine 4 MG tablet  Commonly known as: ZANAFLEX  Take 1 tablet (4 mg) by mouth every 6 hours as needed (spasm).   Quantity: 20 tablet  Refills: 0     zolpidem 5 MG tablet  Commonly known as: Ambien  Take 1 tablet (5 mg) by mouth nightly as needed for Insomnia.   Quantity: 30 tablet  Refills: 0        STOP taking these medications    cyclobenzaprine 5 MG tablet  Commonly known as: FLEXERIL     diclofenac 1 % gel  Commonly known as: VOLTAREN     oxyCODONE-acetaminophen 5-325 MG tablet  Commonly known as: PERCOCET     Percocet 5-325 MG tablet  Generic drug: oxyCODONE-acetaminophen           Where to Get Your Medications      These medications were sent to [id number] [place] - [address]  [address], [place] [address]    Hours: Mon-Fri 6am-10pm, Sat-Sun 9am-7pm Phone: [phone]    docusate sodium 250 MG capsule   lidocaine 4 %  patch   oxyCODONE 10 MG tablet   senna 8.6 MG tablet   tiZANidine 4 MG tablet     These medications were sent to [facility]  [address], [place] [address]    Hours: Mon-Fri: 8:30am-7:00pm; Sat-Sun & Holidays: 9:00am-5:00pm Phone: [phone]    naloxone 4 mg/0.1 mL nasal spray         Allergies:  Sulfa drugs and Tramadol    Discharge Disposition:  Home.    Discharge Code Status:  Full code / full care  This code status is not changed from the time of admission.    Follow Up Appointments:    Scheduled appointments:  Future Appointments   Date Time Provider Department Center   11/08/2019  1:00 PM Zlomislic, Alger Memos, MD [facility]   11/16/2019  1:00 PM Henrene Pastor, MD [facility]   11/22/2019  2:20 PM Zlomislic, Alger Memos, MD [facility]       For appointments requested for after discharge that have not yet been scheduled, refer to the Post Discharge Referrals section of the After Visit Summary.    Discharging Physician's Contact Information:  [facility] operator at [phone].

## 2019-11-05 ENCOUNTER — Other Ambulatory Visit (INDEPENDENT_AMBULATORY_CARE_PROVIDER_SITE_OTHER): Payer: BC Managed Care – PPO

## 2019-11-05 ENCOUNTER — Encounter (INDEPENDENT_AMBULATORY_CARE_PROVIDER_SITE_OTHER): Payer: Self-pay | Admitting: Orthopaedic Surgery of the Spine

## 2019-11-05 NOTE — Interdisciplinary (Signed)
11/05/19 0855   Discharge Plan   Patient/Family/Other Engaged in Discharge Planning * Yes   Name, Relationship and Phone Number of Person Engaged in the Discharge Plan patient   Patient/Family/Legal/Surrogate Decision Maker Has Been Given a List Options And Choice In The Selection of Post-Acute Care Providers * Yes  [facility] and [facility] accepted. Pt chose [facility])   Final Discharge Destination/Services   Final Discharge Destination/Services * Home Health   Home Health/Home Infusion   [facility] Health Care Agency [facility]   Phone * [phone]       [facility] accepted pt. Pt made aware and has chosen [facility]. Contact number of [facility] given to pt. [facility] made aware  that pt was discharged yesterday  and that they are chosen provider and to contact pt for [facility]. Pt agreed to DCP.

## 2019-11-05 NOTE — Progress Notes (Signed)
ORTHOPAEDIC SPINE SURGERY     Visit Type: Office Visit    Reason For Visit: No chief complaint on file.    Surgery Date: 01/03/2019    Procedure: ROI, revision XLIF L4-5 and PSIF L4-5     History Of Present Illness: 39 year old female returns to spine clinic now approximately 8 months s/p revision lumbar fusion L4-5 as noted above. She is seen today by video telehealth as a result of covid pandemic. She continues to have similar complaints of low back pain and describes a constellation of variable symptoms including pain extending up her back to the thoracic region, pain involving the lumbosacral region as well as pain and paresthesias into both legs. She is at times tearful and inconsolable during the visit, and at other times appears to be comfortable and with no pain. To review briefly, she states that she continues to have significant low back pain that is ongoing and limits her activities. At times she feels better, however she continues to have pain that is easily exacerbated by activities and states that she is at times unable to get out of bed as a results. She relates majority of her pain to the her low back but also complaints of generalized thoracolumbar back pain. She describes intermittent feelings of pain and weakness into bilateral lower extremities. She has no focal weakness, but describes feelings of her legs being unsteady at times.  She has undergone trial of PT, but that had resulted in exacerbation of her symptoms. Most recently, she had trial of si joint injections which did not provide her any relief of symptoms. She continues to take pain medications for pain control. To review briefly, she underwent wide lumbar laminectomy and instrumentation L4-5 in [place] about one year ago following which she continued to have severe pain, and ultimately proceeded with revision lumbar stabilization with fusion L4-5 as noted above. No issues with incisions. Patient rates her pain as a  . She has had  multiple attempts at injections without any sustained relief. Patient reports no changes in bowel and bladder function. Patient returns for routine follow up.    Allergies:   Allergies   Allergen Reactions   . Sulfa Drugs Nausea Only and Anxiety   . Tramadol Nausea and Vomiting       Medications:   Current Outpatient Medications   Medication Sig   . acetaminophen (TYLENOL) 325 MG tablet Take 3 tablets (975 mg) by mouth every 8 hours.   . ALPRAZolam (XANAX) 0.25 MG tablet take 1 tablet by mouth once daily if needed   . diazepam (VALIUM) 5 MG tablet Take 1 tablet (5 mg) by mouth every 6 hours as needed for Insomnia or Muscle Spasms.   Marland Kitchen docusate sodium (COLACE) 250 MG capsule Take 1 capsule (250 mg) by mouth at bedtime.   . gabapentin (NEURONTIN) 300 MG capsule Take 300 mg by mouth 3 times daily.   Marland Kitchen lidocaine (ASPERCREME) 4 % patch Apply 2 patches topically every 24 hours as needed (pain). Leave patch on for 12 hours, then remove for 12 hours.   . naloxone (NARCAN) 4 mg/0.1 mL nasal spray For suspected opioid overdose, spray once in one nostril. CALL 911.  Repeat after 3 minutes if no or minimal response.   . ondansetron (ZOFRAN ODT) 4 MG disintegrating tablet Dissolve 1 tablet (4 mg) by mouth every 8 hours as needed for Nausea/Vomiting.   Marland Kitchen oxyCODONE (ROXICODONE) 10 MG tablet Take 1 tablet (10 mg) by mouth every  3 hours as needed for Moderate Pain (Pain Score 4-6).   Marland Kitchen polyethylene glycol (GLYCOLAX) 17 GM/SCOOP powder 17 g by Oral route.   . senna (SENOKOT) 8.6 MG tablet Take 2 tablets (17.2 mg) by mouth every morning.   Marland Kitchen tiZANidine (ZANAFLEX) 4 MG tablet Take 1 tablet (4 mg) by mouth every 6 hours as needed (spasm).   Marland Kitchen zolpidem (AMBIEN) 5 MG tablet Take 1 tablet (5 mg) by mouth nightly as needed for Insomnia.     No current facility-administered medications for this visit.          Review of Systems:  CONSTITUTIONAL: No unexpected weight loss, fevers, chills, or anorexia. SKIN: Noncontributory. GENITOURINARY:  Symptoms as noted above in HPI. NEUROLOGIC: Symptoms as noted above in HPI. MUSCULOSKELETAL: Symptoms as noted above in HPI.    Physical Examination:  There were no vitals taken for this visit.  CONSTITUTIONAL: Well appearing and well groomed. PSYCHOLOGICAL: Alert and oriented and appropriate to situation. INTEGUMENT: Intact. VASCULAR: Radial and pedal pulses are intact and symmetric. EXTREMITIES: Bilateral upper and lower extremities have good range of motion and no significant deformities. GAIT: Brisk with good coordination.     CERVICAL SPINE: Nontender to palpation. Spurling's test negative. Sensation intact of the upper extremities.    RANGE OF MOTION (in degrees)       RT LT  Lateral motion   80  80  Lateral bend   45  45  Flexion 40 degrees. Extension 35 degrees. Motion in all planes is nonpainful.    MOTOR    RT LT  Trapezius   5/5  5/5  Deltoid    5/5  5/5  Bicep    5/5  5/5  Tricep    5/5  5/5  Wrist flexor   5/5  5/5  Wrist extensor  5/5  5/5  Intrinsics   5/5  5/5  Grip    5/5  5/5    DEEP TENDON REFLEXES       RT  LT  Bicep    2+  2+  Brachioradialis  2+  2+  Tricep    2+  2+    THORACIC SPINE  Nontender to percussion. Alignment - normal, no paravertebral muscle fullness. Sensation intact. Beevor's negative. Abdominal reflexes are absent and symmetric.     LUMBAR SPINE  Left lumbosacral, paraspinal and SI joint tenderness to palpation. Heel to toe walk with difficulty due to pain. Sensation intact of the lower extremities. Trendelenburg sign negative. Skin intact.    RANGE OF MOTION        RT  LT  lateral bend, normal at 25 degrees 25  25  rotation, normal at 30 degrees 30  30  Flexion - normal at 40 degrees. Extension - normal at 10 degrees.    MOTOR STRENGTH       RT  LT  Iliopsoas   5/5  5/5  Quadricep   5/5  5/5  Anterior tibialis   5/5  5/5  Extensor hallucis longus 5/5  5/5  Gastrocsoleus  5/5  5/5    DEEP TENDON REFLEXES       RT  LT  Patellar   2+  2+  Achilles   2+  2+    PATHOLOGIC  REFLEXES      RT LT  Clonus    Neg Neg  Babinski   Neg Neg  Hoffmans   Neg Neg    STRAIGHT LEG RAISING  RT  LT  Sitting @ 90 degrees   Neg Neg    IMAGING STUDIES:   X-ray Lumbosacral Spine 2 Or 3 Views    Result Date: 06/14/2019  IMPRESSION: Posterior decompression and combined anterior and posterior instrumented fixation at L4-L5 without evidence of hardware complication. 7 mm anterolisthesis of L4 over L5 without change between the lateral flexion-extension views.    X-ray Lumbosacral Spine 2 Or 3 Views    Result Date: 06/14/2019  IMPRESSION: No interval change. Status post L4-L5 pedicle screws instrumentation, anterior interbody grafting and posterior bone grafting after decompression without change in alignment or hardware complication.    CT lumbar spine shows:  Posterior decompression and instrumented fixation at L4-L5 and placement of an interbody graft at L4-L5.    Minimal lucency surrounding the L4 transpedicular screws may represent micro motion. The interbody graft is not fully incorporated.    4 mm anterolisthesis of L4 on L5.    Persistent fluid collection in the surgical bed, similar to the MRI of 04/18/2019 may represent a seroma. Sampling of the fluid collection could be obtained if there is a clinical concern about an infection.        OTHER MEDICAL RECORDS/IMAGING STUDIES REVIEWED: None    IMPRESSION:   S/p lumbar fusion  Chronic low back pain     PLAN/DISCUSSION: Reviewed clinical history as well as exam results and imaging findings with patient in detail. Review of prior imaging again shows intact instrumentation with near anatomic alignment, with slight residual spondylolisthesis, without evidence of any significant stenosis. There is a seroma in the laminectomy bed. There is minimal lucency around pedicle screw at L4 without frank halo formation, likely related to reinstrumentation given revision surgery following an index instrumentation procedure in [place] that resulted in severe  disability and pain.  In addition graft does appear to be incorporating without evidence of subsidence or lucency.  She does continue to have symptoms of severe low back pain, which at times is near completely incapacitating, and which seems out of proportion to her imaging findings, and continues to have intermittent radicular type complaints, though no evidence of stenosis on imaging, and therefore etiology of her symptoms is not entirely clear. At her index surgery in [place] there was complete resection of posterior elements violating the level at L3-4 and she has associated pain and "feelings of instability" at this level just above her fusion, however she has not had relief with injections.  We discussed options at length. We have discussed possibility of need to extend fusion to L3 though given the nonspecific nature of her low back pain and pain that does not correlate with her imaging findings, would not recommend further fusion level. She states that she is unable to engage in any PT because it causes too much pain and is interested in surgical address. We have described options a length. She has refused prior attempts at discogram to assess pain at the L5-S1 level. We have discussed additional diagnostic facet blocks at L5-S1 to assess possible contribution of pain from this level. She does endorse history of metal sensitivities and we again discussed with her the possibility that instrumentation be potentially be the source of her pain. Given the multifactorial and likely psychosomatic component of her pain, we have discussed surgical plan would involve wound exploration with exploration of fusion, evacuation of seroma, removal of retained instrumentation and likely in situ fusion if there is concern for instability, though this does not appear likely. We have explicitly  discussed with her that there will not be any extension of fusion to additional levels. We recommend additional CT lumbar spine near  the one year mark which will further reinforce plan depending on imaging results, order placed. Over 60 minutes were spent in discussion and formulation of plan and counseling with patient including review of all options in detail. All questions were answered and Castella Lerner understood and was satisfied with this plan.     FOLLOWUP: Return to clinic:six weeks. The patient is encouraged to call us with any questions or problems in the interim. Our contact numbers were given to the patient.      ---------------------(data below generated by Hyman Hopes, MD)--------------------     Patient Verification & Telemedicine Consent & Financial Waiver:    1.   Identity: I have verified this patient's identity to be accurate.  2.   Consent: I verify consent has been secured in one of the following methods: (a) obtained written/ online attestation consent (via MyChartVideoVisit pathway), (b) the spoke-side provider has obtained verbal or written consent from patient/surrogate (if this is a "provider to provider" evaluation), or (c) in all other cases, I have personally obtained verbal consent from the patient/ surrogate (noting all elements below) to perform this voluntary telemedicine evaluation (including obtaining history, performing examination and reviewing data provided by the patient).   The patient/ surrogate has the right to refuse this evaluation.  I have explained risks (including potential loss of confidentiality), benefits, alternatives, and the potential need for subsequent face to face care. Patient/ surrogate understands that there is a risk of medical inaccuracies given that our recommendations will be made based on reported data (and we must therefore assume this information is accurate).  Knowing that there is a risk that this information is not reported accurately, and that the telemedicine video, audio, or data feed may be incomplete, the patient agrees to proceed with evaluation and holds [place] harmless  knowing these risks.  3.   Healthcare Team: The patient/ surrogate has been notified that other healthcare professionals (including students, residents and Engineer, maintenance) may be involved in this audio-video evaluation.   All laws concerning confidentiality and patient access to medical records and copies of medical records apply to telemedicine.  4.   Privacy: If this is a Restaurant manager, fast food Visit, the patient/ surrogate has received the [place] Notice of Privacy Practices via E-Checkin process.  For all other video visit techniques, I have verbally provided the patient/ surrogate with the [place] web link in [place] ([email or web address]) or Spanish ([email or web address]).  The patient/ surrogate acknowledges both being provided the NPP link, and has been offered to have the NPP mailed to the patient/ surrogate by [place] mail.  The patient/ surrogate has voiced understanding an acknowledgement of receipt of this NPP web address.  If the patient/surrogate has elected to receive the NPP via [place] mail, I verify that the NPP will be sent promptly to the patient/surrogate via [place] mail.  5.   Capacity: I have reviewed this above verification and consent paragraph with the patient/ surrogate and the patient is capacitated or has a surrogate. If the patient is not capacitated to understand the above, and no surrogate is available, since this is not an emergency evaluation, the visit will be rescheduled until such time that the patient can consent, or the surrogate is available to consent. If this is an emergency evaluation and the patient is not capacitated to understand the above, and no surrogate is available, I am proceeding  with this evaluation as this is felt to be an emergency setting and no appropriate specialist is available at the bedside to perform these evaluations.  6.   Financial Waiver: If this is a Restaurant manager, fast food Visit, the patient has been made aware of the  financial waiver via E-Checkin process.  For all other video visit techniques, an E-Checkin process is not performed.  As such, I have personally verbally informed the patient/ surrogate that this evaluation will be a billable encounter similar to an in-person clinic visit, and the patient/ surrogate has agreed to pay the fee for services rendered.  If we are billing insurance for the patient's telehealth visit, her out-of-pocket cost will be determined based on her plan and will be billed to her.  The patient/ surrogate has also been informed that if the patient does not have insurance or does not wish to use insurance, [facility] [address] [employer] price for a primary care telehealth visit is $59.00 and specialist telehealth visit is $88.00.  I have further informed the patient/ surrogate that in the event the patient has additional services provided in conjunction with the specialty visit (Ex. Psychotherapy services), those services will be billed at the current rate less a 45% discount.  7.   Intra-State Location: The patient/ surrogate attests to understanding that if the patient accesses these services from a location outside of [place], that the patient does so at the patient's own risk and initiative and that the patient is ultimately responsible for compliance with any laws or regulations associated with the patient's use.  8.   Specific Use:The patient/ surrogate understands that [facility] makes no representation that materials or servicesdelivered via telecommunication services, or listed on telemedicine websites, are appropriate or available for use in any other location.           Demographics:  Medical Record #: [id number]  Date: September 27, 2019  Patient Name: Haley Barrett  DOB: November 17, 1980  Age: 39 year old  Sex: female  Location: Home address on file     Evaluator(s):  Naiyah Klostermann was evaluated by me today.    Clinic Location: [facility]  [address]  [place] [address]

## 2019-11-06 ENCOUNTER — Encounter (INDEPENDENT_AMBULATORY_CARE_PROVIDER_SITE_OTHER): Payer: Self-pay | Admitting: Orthopaedic Surgery of the Spine

## 2019-11-06 NOTE — Interdisciplinary (Signed)
Procedure: [employer] Lumbar Corset [phone]) "Warm & Form"  applied      Location: Spine  Instruction/Education Provided: yes    Ordering Physician:  Zlomislic, Vinko MD    Breg Product: No     Brace was issued by [facility] staff    Brace was fit and applied by PT staff

## 2019-11-07 ENCOUNTER — Encounter (INDEPENDENT_AMBULATORY_CARE_PROVIDER_SITE_OTHER): Payer: Self-pay | Admitting: Orthopaedic Surgery of the Spine

## 2019-11-07 NOTE — Op Note (Signed)
DATE OF SERVICE:  11/02/2019    PREOPERATIVE DIAGNOSIS:    1. Chronic low back pain.  2. Status post remote revision lumbar fusion L4-L5 with retained  instrumentation.     POSTOPERATIVE DIAGNOSIS:    1. Chronic low back pain.  2. Status post remote revision lumbar fusion L4-L5 with retained  instrumentation.     PROCEDURE PERFORMED:    1. Removal of retained instrumentation L4-L5 consisting of screws and  rods (DePuy Synthes).   2. Exploration of fusion L4-L5 (no motion).  3. Revision laminectomies L4-L5 with irrigation and debridement and  resection of deep epidural seroma.   4. Posterior spinal in situ fusion L4-L5 with cortical cancellous  allograft chips, DBM (Accell), rhBMP-2.   5. Interpretation of intraoperative fluoroscopy.    SURGEON/STAFF:  Devansh Riese, MD    ASSISTANTS:    1. Frederic Jericho, MD.  2. Talbert Cage, MD.     Note, there was no qualified orthopedic surgery resident available to  assist with this case.     ANESTHESIA:  General.    FINDINGS:  Deep epidural seroma, no evident motion at L4-L5, no  evidence of purulence or infection.     WOUND CLASSIFICATION:  Class 1.    WOUND CLOSURE STATUS:  All layers closed.    SPECIMENS:  None.    IV FLUIDS:  200 cc LR.    BLOOD PRODUCTS:  None.    ESTIMATED BLOOD LOSS:  10 cc.    URINE OUTPUT:  No Foley.    COMPLICATIONS:  None.    INDICATIONS:  Patient is a 39 year old female who has ongoing  worsening complaints of low back pain with intermittent radiating  bilateral lower extremity pain with associated paresthesias.  She has  a complex history with regard to her lumbar spine, having initially  presented to spine clinic with degenerative spondylolisthesis L4-5  with complaints of pain and radiculopathy for which she was indicated  for minimally invasive interbody fusion.  She elected at that time to  pursue surgery overseas in [place] where she underwent surgery with a  motion sparing device at L4-L5 with wide and aggressive bony  resection  with complete laminectomy of L4 and partial laminectomies  of L3 and L5.  Following surgery, she had progression and worsening  of severe low back pain and radiating leg pain for which ultimately  she re-presented to spine clinic and at that time returned to the OR  for removal of retained instrumentation with a revision lumbar  interbody fusion L4-L5 with revision laminectomies and posterior  spinal instrumentation and fusion L4-L5.  She is now approximately 1  year out from that procedure and states that although she has had  some slight improvement she has significant low back pain which  radiates not only proximally but also distally.  She also has a wide  range of complaints including severe intermittent radicular pain as  well as paresthesias and numbness in addition to generalized  complaints of not only lumbar back pain but also thoracic back pain  with extension to the cervicothoracic region as well as lumbosacral  pain.  She is often extremely tearful and inconsolable but at other  times appears very comfortable.  There is likely a psychosomatic  component to her pain.  However, she stated that she has not had any  improvement and has not been able to tolerate or dedicate to any  formal course of physical therapy on account of her pain.  Imaging  demonstrates evidence  of instrumentation and fusion L4-L5 with  apparent intact and well incorporated anterior lumbar interbody  fusion.  There has been some notation as to concern of lucency  regarding L4 pedicle screw.  However, there was no evidence of  pseudoarthrosis or nonunion.  Imaging does show evidence of lumbar  degenerative disk disease and spondylosis at L5-S1, and we discussed  with her further diagnostic procedures to determine whether or not  this contributed to her pain.  She was not interested in diskogram.  We did discuss selective L5-S1 facet and medial branch blocks which  ultimately she did try with some significant improvement in some  of  her lower back pain.  She continues to have complaints that her back  is not stable in spite of imaging showing otherwise.  We, therefore,  discussed with her in detail options including continued conservative  management and pain management modalities.  She has had extensive and  exhaustive conservative management with time, activity modification,  and injections without any relief of her symptoms.  She has not been  compliant with any dedicated physical therapy, and we have repeatedly  discussed with her the need to do so.  She was interested in surgical  address.  She does have a history of metal sensitivity, and we,  therefore, discussed proceeding with removal of retained  instrumentation with exploration and fusion.  Imaging does  demonstrate evidence of moderate seroma in the laminectomy bed at  L4-L5, which is noncompressive, and we, therefore, also discussed  resection and removal of seroma with revision laminectomy and  exploration of fusion with possible in situ fusion L4-L5.  We did  discuss that should there be any gross motion or instability we would  proceed with repeat instrumentation and she is aware of this.  We  have reiterated that we will not proceed with any extension of fusion  to any other levels.  We discussed the planned procedure as well as  the risks, benefits, and alternatives in detail.  Risks include, but  are not limited to, infection, bleeding, damage to nerves and  vessels, dural tear, ongoing back pain as well as leg pain and/or  weakness and/or numbness and paresthesias, hardware complications or  failure, pseudarthrosis or nonunion, additional procedures, as well  as the risk of general anesthesia including, but not limited to, DVT,  PE, stroke, myocardial infarction, and loss of life.  She expressed  her understanding and willingness to proceed in the form of a signed  consent.     PROCEDURE NARRATIVE:  Patient was met in the preoperative holding  area where we again  reviewed the planned procedure with her and  answered all of her questions.  Operative site was marked, and she  was subsequently taken to the operating theater where she succumbed  to general endotracheal anesthesia and was placed prone on a Jackson  frame with all of her extremities and bony prominences appropriately  padded and protected.  She received appropriate preoperative  antibiotic prophylaxis.  The area over the low back was prepped and  draped in the usual sterile fashion.  Prior to proceeding, we  performed a formal preoperative verification and time-out procedure  in which we confirmed patient identity by name, number, and birth  date, as well as the planned procedure and availability of all  necessary equipment.     A 15 blade scalpel used to make a midline incision in line with her  prior incision, and this was carried down through  superficial layers  using electrocautery and maintaining excellent hemostasis.  We  identified the lumbodorsal fascia which was then incised bilaterally,  and the deep paraspinal muscles were elevated in subperiosteal  fashion.  Care was taken not to leave large myofascial flaps and,  therefore, we continued dissection down to the base of the  laminectomy bed where we then widened exposure to expose retained  instrumentation from L4 through L5.  Instrumentation was then widely  exposed, and deep retractors were placed to facilitate visualization.   Access to instrumentation was challenging given the depth and  laterality of its position.  However, we were able to achieve  adequate access.  At this point, we continued to define the bony  elements from L4 through L5.  There was complete resection of prior  L4 spinous process and lamina in addition to resection of L5 and L3  spinous process and lamina.  There did appear to be some abutment of  the instrumentation near the L3-L4 facet joint.  We first completed  removal of retained instrumentation consisting of screws  and  rods L4-5(DePuy Synthes).  We then proceeded with exploration and fusion. There was minimal bone present posterolaterally however we evaluated the motion segment by placing epstein curettes at the pedicle screw tracts at L4 and L5 and we proceeded with stressing of the L4-L5 segment in the sagittal, coronal, and axial planes. There was no evidence of any motion present, although there did appear to be mobility at L3-4 and L5-S1. Given lack of significant posterolateral bone fusion at this level was predicated on incorporation of the L4-L5 interbody cage, anteriorly which had been confirmed on preop CT imaging. We then proceeded with revision laminectomy at L4-5. There was history of prior extensive and wide laminectomies from her prior procedure, however, there was some residual bone in the lateral recess and we, therefore, used straight and forward angle Epstein curettes to delineate the margin between underlying scar tissue and the overlying bone.  We then proceeded with revision laminectomies of L5 and L4 with use of high-speed matchstick bur followed by 2 and 3 mm Kerrison rongeurs to achieve partiallaminectomies of L4 and L5.      After this was complete, we then used large Cobb curettes to again  develop a plane between the underlying bone and the overlying scar  tissue that was present.  We carefully elevated epidural scar off the  underlying dura.  There was a large moderately-sized seroma present  within the laminectomy bed.  However, this did not appear to be  compressive in nature.  This was resected with Cobb curettes as well  as Leksell rongeur and pituitary and Kerrison rongeurs.  We then  proceeded with formal irrigation, debridement, and resection of the  epidural seroma that was present in the laminectomy bed.  We once  more elevated epidural scar, and we were able to resect the seroma in  totality and ensured wide decompression of the cauda equina as well  as exiting nerve roots at this level.      We then copiously irrigated the wound and confirmed excellent  hemostasis.  We then proceeded with preparation of the posterolateral  fusion bed to reinforce the biologic bony fusion at this level.  A  high-speed matchstick bur was used to decorticate the residual bony  elements at L4 and L5 including transverse processes bilaterally in  addition to residual superior and inferior articular processes.  Local bone autograft was then combined with cortical cancellous  allograft chips and DBM putty (Accell) as well as rhBMP-2 and packed  into the posterolateral fusion bed.  At this point, we then obtained final imaging demonstrating adequate alignment.     We then proceeded with closure.  We again confirmed excellent  hemostasis.  A 10 mm flat drain was placed deep to the lumbodorsal  fascia.  Fascia was reapproximated with #1 Vicryl in figure-of-eight  fashion.  Superficial layers were closed in layered fashion with 0  and 2-0 Vicryl.  Skin was closed with 3-0   Monocryl in running subcuticular fashion.  Dermabond was placed as a  skin sealant.  Sterile dressings were placed.  The patient was then  transferred to regular bed and extubated without difficulty.     DISPOSITION:  Patient returned to PACU in stable condition.  Will  continue mechanical DVT prophylaxis.  Mobilize with physical therapy.     Job #:  [id number]

## 2019-11-08 ENCOUNTER — Telehealth (INDEPENDENT_AMBULATORY_CARE_PROVIDER_SITE_OTHER): Payer: BC Managed Care – PPO | Admitting: Orthopaedic Surgery of the Spine

## 2019-11-08 ENCOUNTER — Telehealth (INDEPENDENT_AMBULATORY_CARE_PROVIDER_SITE_OTHER): Payer: Self-pay | Admitting: Orthopaedic Surgery of the Spine

## 2019-11-08 ENCOUNTER — Encounter (INDEPENDENT_AMBULATORY_CARE_PROVIDER_SITE_OTHER): Payer: Self-pay | Admitting: Orthopaedic Surgery of the Spine

## 2019-11-08 DIAGNOSIS — Z09 Encounter for follow-up examination after completed treatment for conditions other than malignant neoplasm: Secondary | ICD-10-CM

## 2019-11-08 DIAGNOSIS — G8929 Other chronic pain: Secondary | ICD-10-CM

## 2019-11-08 DIAGNOSIS — M545 Low back pain, unspecified: Secondary | ICD-10-CM

## 2019-11-08 NOTE — Telephone Encounter (Signed)
Spoke with patient to confirm if appt was needed.  She would like to discuss future injections.  Call when provider is ready.

## 2019-11-09 ENCOUNTER — Inpatient Hospital Stay (HOSPITAL_COMMUNITY): Admit: 2019-11-09 | Payer: Self-pay | Admitting: Orthopaedic Surgery of the Spine

## 2019-11-09 ENCOUNTER — Inpatient Hospital Stay: Admit: 2019-11-09 | Payer: Self-pay | Admitting: Orthopaedic Surgery of the Spine

## 2019-11-09 ENCOUNTER — Encounter (HOSPITAL_COMMUNITY): Payer: Self-pay

## 2019-11-09 SURGERY — LAMINECTOMY, SPINE, LUMBAR, 1 LEVEL, WITH FUSION USING INSTRUMENTATION, POSTERIOR APPROACH
Site: Spine Lumbar

## 2019-11-09 NOTE — Telephone Encounter (Signed)
From: [name]  To: [name], MD  Sent: 11/08/2019 4:06 PM PST  Subject: 2-Procedural Question    Hi,    I forgot to ask Doctor Z today. He or one of his fellows had once mentioned seeing a neurologist a long while back ago. Is there anything he's seen that he'd still want to refer me to one to rule out anything or does he no longer think that's worth doing? My mom is here for a couple week before my next appointment with Doc Z so it's a bit easier for me to get to appointments also right now. :)    Thanks,  Haley Barrett

## 2019-11-10 ENCOUNTER — Encounter (HOSPITAL_BASED_OUTPATIENT_CLINIC_OR_DEPARTMENT_OTHER): Payer: Self-pay | Admitting: Hospital

## 2019-11-10 ENCOUNTER — Encounter (HOSPITAL_BASED_OUTPATIENT_CLINIC_OR_DEPARTMENT_OTHER): Payer: Self-pay

## 2019-11-12 ENCOUNTER — Telehealth (HOSPITAL_BASED_OUTPATIENT_CLINIC_OR_DEPARTMENT_OTHER): Payer: Self-pay

## 2019-11-12 NOTE — Telephone Encounter (Signed)
Patient called to complain of a new rash that began a couple of days ago with itching.  Began initially as little dots, now has begun to look blotchy.  Includes both upper and lower arms, back, and stomach.  Has worsened over the last day or two.  Denies any numbness, tingling, bowel or bladder incontinence, fevers, chills, or other systemic symptoms.  She has taken benadryl without any relief.      I recommended trying another antihistamine and gave her close return precautions, to include any fevers, chills, discharge from the wound, numbness, tingling, bowel or bladder incontinence, or difficulty breathing would be reasons to proceed direct to the emergency room.  She voiced understanding and agreement with the plan.    Oliver Hum, MD  Orthopaedic Surgery, PGY3

## 2019-11-13 ENCOUNTER — Encounter (INDEPENDENT_AMBULATORY_CARE_PROVIDER_SITE_OTHER): Payer: Self-pay | Admitting: Orthopaedic Surgery of the Spine

## 2019-11-14 ENCOUNTER — Encounter (INDEPENDENT_AMBULATORY_CARE_PROVIDER_SITE_OTHER): Payer: Self-pay | Admitting: Orthopaedic Surgery of the Spine

## 2019-11-14 ENCOUNTER — Telehealth (HOSPITAL_BASED_OUTPATIENT_CLINIC_OR_DEPARTMENT_OTHER): Payer: Self-pay | Admitting: Orthopaedic Surgery of the Spine

## 2019-11-14 NOTE — Telephone Encounter (Signed)
Haley Barrett 39 year old   ESTABLISHED PAT ONLY   Reason for call (including the symptoms/concerns patient has): Pt post 11/03/19   Pt woke up this morning feeling a lot of pain.  She was sweating all night.        Provider Name: V Zlomislic       If  Patient had surgery: (Procedure Name) (DOS) 11/03/19 PROCEDURE:   1. Removal of retained instrumentation L4-5 consisting of screws and rods (Depuy synthes)      LOV Plan: We then proceeded with closure.  We again confirmed excellent  hemostasis.  A 10 mm flat drain was placed deep to the lumbodorsal  fascia.  Fascia was reapproximated with #1 Vicryl in figure-of-eight  fashion.  Superficial layers were closed in layered fashion with 0  and 2-0 Vicryl.  Skin was closed with 3-0   Monocryl in running subcuticular fashion.  Dermabond was placed as a  skin sealant.  Sterile dressings were placed.  The patient was then  transferred to regular bed and extubated without difficulty.     DISPOSITION:  Patient returned to PACU in stable condition.  Will  continue mechanical DVT prophylaxis.  Mobilize with physical therapy.         Best time to call patient: (RN please document outbound call) [phone]        Reminder:  Symptom calls should be responded to within 2 hours by RN. If you need a more urgent callback to patient, please use RN chat group and/or page. Use messaging guidelines for red-flag symptoms. Please verify that your patient has received callback with-in the requested timeframe.

## 2019-11-14 NOTE — Telephone Encounter (Signed)
Spoke to pt and she is aware of provider assessment . I have changed f/u appt with Dr. Herma Carson.

## 2019-11-14 NOTE — Telephone Encounter (Signed)
DOS: 11/02/19: exploration of fusion lumbar 4 - lumbar 5, removal of hardware, revision fusion, decompression    Pt reports that she "was chilled lat night" and could not sleep. Reports her pain level is 8 and she has slept on her stomach. "I have been in so much pain for so long, nothing is comfortable."    She is taking all meds as ordered.   States that Oxycodone is more effective than Norco. She is allergic to Sulfa--no Celebrex ordered.   Wound is not red/swollen or draining.   I have asked that she send pix of wound, take her temp.   Advised not to sleep on her stomach. Will send to provider once I get pix/temp.

## 2019-11-14 NOTE — Telephone Encounter (Signed)
The incision looks good.  Continue to monitor.

## 2019-11-15 NOTE — Telephone Encounter (Signed)
Left message for pt: Usually 2 months out  From surgery for steroid injections.

## 2019-11-15 NOTE — Telephone Encounter (Signed)
Pt has appt for PM /DR Z next week. Pt spoke to Rn on 3/9 refer to TE

## 2019-11-15 NOTE — Telephone Encounter (Signed)
Patient calling back to report back to Ardell she is doing fine.    She would like to know if she can schedule steroid injections next week.    Please advise.

## 2019-11-15 NOTE — Telephone Encounter (Signed)
From: [name]  To: [name], MD  Sent: 11/13/2019 2:32 PM PST  Subject: 2-Procedural Question    Hi,    I believe Doctor Z said it might be ok for me to have an epidural steroid injection starting sometime next week if it wasn't in my incision level. Would it be possible to double check with him if I could get that on my schedule to do next week?    Thanks,  [name]

## 2019-11-16 ENCOUNTER — Ambulatory Visit: Payer: BC Managed Care – PPO | Attending: Pain Medicine | Admitting: Pain Medicine

## 2019-11-16 DIAGNOSIS — F329 Major depressive disorder, single episode, unspecified: Secondary | ICD-10-CM | POA: Insufficient documentation

## 2019-11-16 DIAGNOSIS — Z833 Family history of diabetes mellitus: Secondary | ICD-10-CM | POA: Insufficient documentation

## 2019-11-16 DIAGNOSIS — Z882 Allergy status to sulfonamides status: Secondary | ICD-10-CM | POA: Insufficient documentation

## 2019-11-16 DIAGNOSIS — G8929 Other chronic pain: Secondary | ICD-10-CM | POA: Insufficient documentation

## 2019-11-16 DIAGNOSIS — F988 Other specified behavioral and emotional disorders with onset usually occurring in childhood and adolescence: Secondary | ICD-10-CM | POA: Insufficient documentation

## 2019-11-16 DIAGNOSIS — M545 Low back pain: Secondary | ICD-10-CM | POA: Insufficient documentation

## 2019-11-16 DIAGNOSIS — Z803 Family history of malignant neoplasm of breast: Secondary | ICD-10-CM | POA: Insufficient documentation

## 2019-11-16 DIAGNOSIS — Z79899 Other long term (current) drug therapy: Secondary | ICD-10-CM | POA: Insufficient documentation

## 2019-11-16 DIAGNOSIS — M4316 Spondylolisthesis, lumbar region: Secondary | ICD-10-CM | POA: Insufficient documentation

## 2019-11-16 DIAGNOSIS — Z8349 Family history of other endocrine, nutritional and metabolic diseases: Secondary | ICD-10-CM | POA: Insufficient documentation

## 2019-11-16 DIAGNOSIS — Z981 Arthrodesis status: Secondary | ICD-10-CM

## 2019-11-16 DIAGNOSIS — Z8249 Family history of ischemic heart disease and other diseases of the circulatory system: Secondary | ICD-10-CM | POA: Insufficient documentation

## 2019-11-16 DIAGNOSIS — Z888 Allergy status to other drugs, medicaments and biological substances status: Secondary | ICD-10-CM | POA: Insufficient documentation

## 2019-11-16 NOTE — Progress Notes (Signed)
Pain Clinic Telemedicine MyChart Video Visit    ---------------------(data below generated by Henrene Pastor, MD)--------------------    Patient Verification & Telemedicine Consent:    I am proceeding with this evaluation at the direct request of the patient.  I have verified this is the correct patient and have obtained verbal consent and written consent from the patient/ surrogate to perform this voluntary telemedicine evaluation (including obtaining history, performing examination and reviewing data provided by the patient).   The patient/ surrogate has the right to refuse this evaluation.  I have explained risks (including potential loss of confidentiality), benefits, alternatives, and the potential need for subsequent face to face care. Patient/ surrogate understands that there is a risk of medical inaccuracies given that our recommendations will be made based on reported data (and we must therefore assume this information is accurate).  Knowing that there is a risk that this information is not reported accurately, and that the telemedicine video, audio, or data feed may be incomplete, the patient agrees to proceed with evaluation and holds [place] harmless knowing these risks. In this evaluation, we will be providing recommendations only.  The ultimate decision to follow, or not follow, these recommendations will be left to the bedside treating/ requesting practitioner.  The patient/ surrogate has been notified that other healthcare professionals (including students, residents and Engineer, maintenance) may be involved in this audio-video evaluation.   All laws concerning confidentiality and patient access to medical records and copies of medical records apply to telemedicine.  The patient/ surrogate has received the [place] Notice of Privacy Practices.  I have reviewed this above verification and consent paragraph with the patient/ surrogate.  If the patient is not capacitated to understand the above, and no  surrogate is available, since this is not an emergency evaluation, the visit will be rescheduled until such time that the patient can consent, or the surrogate is available to consent.    Demographics:   Medical Record #: [id number]   Date: November 16, 2019   Patient Name: Haley Barrett   DOB: August 30, 1981  Age: 39 year old  Sex: female  Location: Patients Home      Evaluator(s):   [name] was evaluated by me today.    Clinic Location:  [facility]   [facility]  [address]  [place]  [id number]      PM&R / PAIN NEW CONSULT NOTE    Referring Physician Sanda Klein  Primary Care Physician Alyson Ingles N    History of Illness:  This is a 39 year old female with a history of chronic low back pain s/p revision XLIF L4-5 and PSIF L4-5 on 01/03/19, who recently underwent revision fusion L4-5 with Dr. Christa See on 11/02/19.   She states that since the surgery, she has had persistent pain that she notes has improved slightly with surgery but still has low back and bilateral leg pain describes pain as throbbing and burning pain in low back and legs. No particular distribution of pain in legs. Pain significantly limits her activity. She states that since the surgery, she has been unable to progress with sitting, standing, ambulation for longer than a few minutes without having pain exacerbated.     Current Pain Medications:  Oxycodone 10 mg q3h PRN  Tizanidine 4mg  - rarely, only with severe pain, feels sedated  Gabapentin 300mg  TID  Lidocaine patch    Pain Medications Tried and Failed:  Lyrica  Norco  Flexeril 5mg  -  feels sedated  Duloxetine  Valium - helpful    Current Outpatient Medications   Medication Sig Dispense Refill   . acetaminophen (TYLENOL) 325 MG tablet Take 3 tablets (975 mg) by mouth every 8 hours. 90 tablet 1   . ALPRAZolam (XANAX) 0.25 MG tablet take 1 tablet by mouth once daily if needed     . diazepam (VALIUM) 5 MG tablet Take 1 tablet (5 mg) by mouth every 6 hours as needed  for Insomnia or Muscle Spasms. 30 tablet 0   . docusate sodium (COLACE) 250 MG capsule Take 1 capsule (250 mg) by mouth at bedtime. 60 capsule 0   . gabapentin (NEURONTIN) 300 MG capsule Take 300 mg by mouth 3 times daily.     Marland Kitchen lidocaine (ASPERCREME) 4 % patch Apply 2 patches topically every 24 hours as needed (pain). Leave patch on for 12 hours, then remove for 12 hours. 30 patch 0   . naloxone (NARCAN) 4 mg/0.1 mL nasal spray For suspected opioid overdose, spray once in one nostril. CALL 911.  Repeat after 3 minutes if no or minimal response. 2 bottle 3   . ondansetron (ZOFRAN ODT) 4 MG disintegrating tablet Dissolve 1 tablet (4 mg) by mouth every 8 hours as needed for Nausea/Vomiting. 4 tablet 0   . oxyCODONE (ROXICODONE) 10 MG tablet Take 1 tablet (10 mg) by mouth every 3 hours as needed for Moderate Pain (Pain Score 4-6). 120 tablet 0   . polyethylene glycol (GLYCOLAX) 17 GM/SCOOP powder 17 g by Oral route.     . senna (SENOKOT) 8.6 MG tablet Take 2 tablets (17.2 mg) by mouth every morning. 60 tablet 0   . tiZANidine (ZANAFLEX) 4 MG tablet Take 1 tablet (4 mg) by mouth every 6 hours as needed (spasm). 20 tablet 0   . zolpidem (AMBIEN) 5 MG tablet Take 1 tablet (5 mg) by mouth nightly as needed for Insomnia. 30 tablet 0     No current facility-administered medications for this visit.        Patient is not currentlyon any anticoagulation medications    Allergies   Allergen Reactions   . Sulfa Drugs Nausea Only and Anxiety   . Tramadol Nausea and Vomiting         Pain Treatment History:  Other Pain Providers: yes - [facility] (Dr. Nedra Hai)    Physical Therapy: yes - after surgery in April 2020, now unable to do PT due to pain.    Interventional pain procedures:  06/05/19 Bilateral SI joint injections  04/25/18 Bilateral L4-5 and L5-S1 facet injections and MBB - briefly helped then pain worsened    Non-interventional pain treatments:  Physical Therapy      Additional History:  Past  Medical History:   Diagnosis Date   . ADD (attention deficit disorder)    . Clavicular fracture      Past Surgical History:   Procedure Laterality Date   . BACK SURGERY  07/28/2018    In [place]   . HUMERUS FRACTURE SURGERY Left 1993   . ------------OTHER-------------  39 yo    humerus ORIF    . SPINE SURGERY  July 28, 2018, April 2020     Social History     Socioeconomic History   . Marital status: Single     Spouse name: Not on file   . Number of children: Not on file   . Years of education: Not on file   . Highest  education level: Not on file   Occupational History   . Not on file   Social Needs   . Financial resource strain: Not on file   . Food insecurity     Worry: Not on file     Inability: Not on file   . Transportation needs     Medical: Not on file     Non-medical: Not on file   Tobacco Use   . Smoking status: Never Smoker   . Smokeless tobacco: Never Used   Substance and Sexual Activity   . Alcohol use: Yes     Alcohol/week: 4.0 standard drinks     Types: 4 Glasses of wine per week     Comment: socially, couple drinks/week    . Drug use: Yes     Types: Cannabinoid (CBD)     Comment: edibles   . Sexual activity: Not Currently     Partners: Male     Birth control/protection: Pill, Abstinence   Lifestyle   . Physical activity     Days per week: Not on file     Minutes per session: Not on file   . Stress: Not on file   Relationships   . Social Product manager on phone: Not on file     Gets together: Not on file     Attends religious service: Not on file     Active member of club or organization: Not on file     Attends meetings of clubs or organizations: Not on file     Relationship status: Not on file   . Intimate partner violence     Fear of current or ex partner: Not on file     Emotionally abused: Not on file     Physically abused: Not on file     Forced sexual activity: Not on file   Other Topics Concern   . Military Service No   . Blood Transfusions No   . Occupational Exposure No   . Seat  Belt Use Yes   Social History Narrative    Single    Psychotherapist - just beginning, in Biomedical engineer. Went to school in [place].    Born in [place] and moved to [place] 6 years ago    [place] and history tours     Hobbies: beach volleyball, horse back riding, exercise      Additional Social History:   Currently working/school: no  Alcohol or substance abuse/use: no  Current exercise: no, limited due to pain  History of Depression/Anxiety/Mental Illness: yes - ADD    Family History   Problem Relation Name Age of Onset   . Diabetes Paternal Uncle     . Breast Cancer Paternal Aunt     . Breast Cancer Mother Desi 67   . Cancer Mother Desi         Breast Cancer   . Diabetes Mother Desi         Pre-diabetic   . Thyroid Mother Desi         [place]   . Breast Cancer Paternal Grandmother     . Heart Disease Father Gershon Mussel         Had some issue that was resolved   . Colon Cancer Neg Hx     . Hypertension Neg Hx     . Cholesterol/Lipid Disorder Neg Hx         CURES  We ran the patient's name and  date-of-birth through the [facility] Department of Justice Prescription Drug Monitoring Program CURES website which will list controlled drug prescriptions filled at [facility] in the past 12 months. The information in the patient's CURES report is consistent with their self-report and shows no evidence of opioid abuse or doctor shopping.      Physical Exam:   Vitals: There were no vitals taken for this visit.  General: healthy, alert, no distress  Mental Status:  alert, oriented. Speech is clear/ normal  Affect: euthymic  Skin: negative  HEENT:  Pupils equal, not pinpoint.  Pulmonary:  Breathing easily without tachypnea or bradypnea.  Cardiac:  No LE edema.  Abdomen: not distended    Imaging  CT Lumbar Spine 10/07/19  IMPRESSION:  No acute fracture or dislocation of the lumbar spine.    Thin lucencies around both L4 screws, slightly more prominent than on prior study, suggesting micro motion. Posterior  bone graft has not yet incorporated and is sparse. Anterior graft is incorporating, but not yet solidly fused. No change in posterior fluid collection at the surgical bed. No change in 4 mm L4/L5 anterolisthesis.      Diagnosis  No diagnosis found.    Assessment  This is a 38 year old female with a history of chronic low back pain s/p revision XLIF L4-5 and PSIF L4-5 on 01/03/19, who recently underwent revision fusion L4-5 with Dr. Christa See on 11/02/19.  She has slightly improved but still debilitating lumbar back neuropathic and myofascial pain that radiates into her legs bilaterally.       PLAN  Interventions:   - Will hold off on trialing epidural steroid injections until cleared by Dr. Volanda Napoleon.   - Besides ESI, may also benefit from trigger point injections, botulinum injections, or potential spinal cord stimulator trial in the future     Medication recommendations:   - Continue oxycodone 10 mg q3h PRN for the time being   - Continue tizanadine 4 mg HS  - Increase gabapentin to 600 mg TID    After we determine response to increase gabapnetin, will plan to trial one of the following options  - Adding cymbalta 30 mg daily - for myofascial and neuropathic pain  - Adding baclofen 10 mg TID - for muscle spasm related pain  - Adding pamelor 50 mg BID for neuropathic pain  - Adding ropinerole 0.5 mg TID for lower extremity symptoms      - [facility] is typically a consultation service with regard to medications and as such we do not take over the writing of routine prescriptions for patients. While we are improving her pain management, please communicate with our team if you would like [place] to take over the management of these medication changes    Follow-up: PRN     Thank you for the consultation, please call with any questions.    Total duration of encounter spent in pre-visit (reviewing last visit, reviewing prior Epic notes, reviewing CURES, reviewing labs and reviewing images), intra-visit  (updating relevant history, performing a video-based physical exam, creating a treatment plan and medical discussion with patient), and post-visit (note completion, placing of orders and coordination of care) on the day of the encounter, excluding separately reportable services/procedures: 55 minutes.

## 2019-11-20 ENCOUNTER — Telehealth (INDEPENDENT_AMBULATORY_CARE_PROVIDER_SITE_OTHER): Payer: Self-pay | Admitting: Orthopaedic Surgery of the Spine

## 2019-11-20 NOTE — Telephone Encounter (Signed)
Lm on pt vm about front entrance and possible wait

## 2019-11-21 ENCOUNTER — Encounter (INDEPENDENT_AMBULATORY_CARE_PROVIDER_SITE_OTHER): Payer: Self-pay | Admitting: Orthopaedic Surgery of the Spine

## 2019-11-21 DIAGNOSIS — Z981 Arthrodesis status: Secondary | ICD-10-CM

## 2019-11-22 ENCOUNTER — Ambulatory Visit (INDEPENDENT_AMBULATORY_CARE_PROVIDER_SITE_OTHER): Payer: BC Managed Care – PPO | Admitting: Orthopaedic Surgery of the Spine

## 2019-11-22 ENCOUNTER — Encounter (INDEPENDENT_AMBULATORY_CARE_PROVIDER_SITE_OTHER): Payer: BC Managed Care – PPO | Admitting: Orthopaedic Surgery of the Spine

## 2019-11-22 ENCOUNTER — Encounter (INDEPENDENT_AMBULATORY_CARE_PROVIDER_SITE_OTHER): Payer: Self-pay | Admitting: Orthopaedic Surgery of the Spine

## 2019-11-22 ENCOUNTER — Inpatient Hospital Stay (INDEPENDENT_AMBULATORY_CARE_PROVIDER_SITE_OTHER)
Admit: 2019-11-22 | Discharge: 2019-11-22 | Disposition: A | Discharge status: Home Health | Payer: BC Managed Care – PPO

## 2019-11-22 VITALS — BP 109/68 | HR 75 | Temp 98.2°F | Ht 70.0 in | Wt 178.6 lb

## 2019-11-22 DIAGNOSIS — Z981 Arthrodesis status: Secondary | ICD-10-CM

## 2019-11-22 DIAGNOSIS — Z09 Encounter for follow-up examination after completed treatment for conditions other than malignant neoplasm: Secondary | ICD-10-CM

## 2019-11-27 ENCOUNTER — Encounter (HOSPITAL_BASED_OUTPATIENT_CLINIC_OR_DEPARTMENT_OTHER): Payer: Self-pay | Admitting: Pain Medicine

## 2019-11-27 ENCOUNTER — Telehealth (HOSPITAL_BASED_OUTPATIENT_CLINIC_OR_DEPARTMENT_OTHER): Payer: Self-pay | Admitting: Orthopaedic Surgery of the Spine

## 2019-11-27 NOTE — Telephone Encounter (Addendum)
Haley Barrett  requested callback for advice on: Patient reports to continue have pain. Per patient she tried to shower and take a walk and pain is there. Patient requesting to move appointment to a sooner day. Current appointment is 04/14. Pain level is 3/10 but she hasn't gotten out of bed. Patient would also like to know if she can start injections? Per patient her and Dr. Herma Carson had talked about waiting 2 months before considering injections. However, due to pain pat would like to know if she can start them sooner?      Treating Physician: Dr. Christa See      LOV notes/plan:  03//17/2021 Not available        Best number to be reached at: [phone]            Route to RN pool if requesting clinical advice, route to MA if administrative advice

## 2019-11-27 NOTE — Telephone Encounter (Addendum)
DOS 10/13/19 Dr. Herma Carson   exploration of fusion lumbar 4 - lumbar 5, removal of hardware, revision fusion, decompression    Continues with pain which is constant. "Sometimes I can't even get out of bed like right now. "  She needs assistance in walking due to pain. No change in B/B function. She will be seeing pain manage 3/24. I have changed appt to 3/29 for Dr. Herma Carson.  Will send to provider.

## 2019-11-29 ENCOUNTER — Ambulatory Visit: Payer: BC Managed Care – PPO | Attending: Anesthesiology | Admitting: Anesthesiology

## 2019-11-29 ENCOUNTER — Encounter (INDEPENDENT_AMBULATORY_CARE_PROVIDER_SITE_OTHER): Payer: BC Managed Care – PPO | Admitting: Orthopaedic Surgery of the Spine

## 2019-11-29 ENCOUNTER — Other Ambulatory Visit: Payer: Self-pay

## 2019-11-29 DIAGNOSIS — M545 Low back pain: Secondary | ICD-10-CM | POA: Insufficient documentation

## 2019-11-29 NOTE — Progress Notes (Addendum)
[facility] Kitchen.  Pain Clinic Telemedicine New Consult MyChart Video Visit    ---------------------(data below generated by Macky LowerKrishnan Vijayaraghavan Chakravarthy, MD)--------------------     Patient Verification & Telemedicine Consent & Financial Waiver:    1.   Identity: I have verified this patient's identity to be accurate.  2.   Consent: I verify consent has been secured in one of the following methods: (a) obtained written/ online attestation consent (via MyChartVideoVisit pathway), (b) the spoke-side provider has obtained verbal or written consent from patient/surrogate (if this is a "provider to provider" evaluation), or (c) in all other cases, I have personally obtained verbal consent from the patient/ surrogate (noting all elements below) to perform this voluntary telemedicine evaluation (including obtaining history, performing examination and reviewing data provided by the patient).   The patient/ surrogate has the right to refuse this evaluation.  I have explained risks (including potential loss of confidentiality), benefits, alternatives, and the potential need for subsequent face to face care. Patient/ surrogate understands that there is a risk of medical inaccuracies given that our recommendations will be made based on reported data (and we must therefore assume this information is accurate).  Knowing that there is a risk that this information is not reported accurately, and that the telemedicine video, audio, or data feed may be incomplete, the patient agrees to proceed with evaluation and holds us harmless knowing these risks.  3.   Healthcare Team: The patient/ surrogate has been notified that other healthcare professionals (including students, residents and Engineer, maintenancetechnical personnel) may be involved in this audio-video evaluation.   All laws concerning confidentiality and patient access to medical records and copies of medical records apply to telemedicine.  4.   Privacy: If this is a Restaurant manager, fast foodMyChart Video Visit, the patient/  surrogate has received the [place] Notice of Privacy Practices via E-Checkin process.  For all other video visit techniques, I have verbally provided the patient/ surrogate with the [place] web link in [place] (https://health.dDotCom.siucsd.edu/hipaa/Pages/hipaa.aspx) or Spanish (https://health.LavishToys.chucsd.edu/hipaa/Pages/hipaa_sp.aspx).  The patient/ surrogate acknowledges both being provided the NPP link, and has been offered to have the NPP mailed to the patient/ surrogate by US mail.  The patient/ surrogate has voiced understanding an acknowledgement of receipt of this NPP web address.  If the patient/surrogate has elected to receive the NPP via US mail, I verify that the NPP will be sent promptly to the patient/surrogate via US mail.  5.   Capacity: I have reviewed this above verification and consent paragraph with the patient/ surrogate and the patient is capacitated or has a surrogate. If the patient is not capacitated to understand the above, and no surrogate is available, since this is not an emergency evaluation, the visit will be rescheduled until such time that the patient can consent, or the surrogate is available to consent. If this is an emergency evaluation and the patient is not capacitated to understand the above, and no surrogate is available, I am proceeding with this evaluation as this is felt to be an emergency setting and no appropriate specialist is available at the bedside to perform these evaluations.  6.   Financial Waiver: If this is a Restaurant manager, fast foodMyChart Video Visit, the patient has been made aware of the financial waiver via E-Checkin process.  For all other video visit techniques, an E-Checkin process is not performed.  As such, I have personally verbally informed the patient/ surrogate that this evaluation will be a billable encounter similar to an in-person clinic visit, and the patient/ surrogate has agreed to pay  the fee for services rendered.  If we are billing insurance for the patient's telehealth visit, her  out-of-pocket cost will be determined based on her plan and will be billed to her.  The patient/ surrogate has also been informed that if the patient does not have insurance or does not wish to use insurance, [place] [employer] price for a primary care telehealth visit is $59.00 and specialist telehealth visit is $88.00.  I have further informed the patient/ surrogate that in the event the patient has additional services provided in conjunction with the specialty visit (Ex. Psychotherapy services), those services will be billed at the current rate less a 45% discount.  7.   Intra-State Location: The patient/ surrogate attests to understanding that if the patient accesses these services from a location outside of [place], that the patient does so at the patient's own risk and initiative and that the patient is ultimately responsible for compliance with any laws or regulations associated with the patient's use.  8.   Specific Use:The patient/ surrogate understands that [facility] makes no representation that materials or servicesdelivered via telecommunication services, or listed on telemedicine websites, are appropriate or available for use in any other location.           Demographics:  Medical Record #: [id number]  Date: December 18, 2019  Patient Name: Haley Barrett  DOB: [birth year]  Age: 39 year old  Sex: female  Location: Home address on file     Evaluator(s):  Grayce Budden was evaluated by me today.    Clinic Location: [facility]  [address]  [place] [address]    PAIN NEW CONSULT NOTE  Referring Physician Zlomislic, Vinko  Primary Care Physician Gwynneth Munson    Chief Complaint: No chief complaint on file.      History of Present Illness:  This is a 39 year old female with a history of chronic low back pain s/p revision XLIF L4-5 and PSIF L4-5 on 01/03/19, who recently underwent revision fusion V4-2 with Dr. Lelon Huh on 5/95/63.   Pt reports that she is very  limited with activities due to pain and continues to take narcos and she continues to throbbing and burning pain in lower back and legs; she does not describe it as sharp, shooting pain. The pain is worse upon exhalation. The pain has become worse than prior to the spinal surgeries. She reports that movements make the pain worse, including standing, ambulation (she is still able to ambulate but the pain is exacerbated). She constantly uses ice packs and medications have helped her pain with some relief. Pt denies any weakness but endorses pins and needles sensation in the both feet in the morning. Denies any bowel or bladder incontinence. Currently she relies on her parents to take care of the household and taking care of the dog.         The patient has seen a physical therapist to treat the current problem.   PT helped partially  The patient is not doing a home exercise program.    The patient denies saddle anesthesia or bowel or bladder incontinence.    Over the past week the patient's pain has been at its worst 10/10, at best 2/10 and averages 7/10.   During the past week, it has interfered with enjoyment of life 10/10 and general activity 10/10.    Therapeutic History:   The patient has seen other pain providers.  Prior interventional pain procedures and response include:  bilateral L4/5 and L5/S1 lumbar facet and medial branch blacks on 03/28/2018   SI joint pain injection on 05/19/2019  Non-interventional pain treatments and response include:  Physical Therapy    Current Pain Medications:   ibuprofen - helpful  oxycodone - helpful  gabapentin - currently taking and not helpful  tizanidine - currently taking  Current antiplatelet or anticoagulant medications: none    Past Medical History:   Diagnosis Date   . ADD (attention deficit disorder)    . Clavicular fracture      Past Surgical History:   Procedure Laterality Date   . BACK SURGERY  07/28/2018    In [place]   . HUMERUS FRACTURE SURGERY Left 1993   .  ------------OTHER-------------  39 yo    humerus ORIF    . SPINE SURGERY  July 28, 2018, April 2020     Current Outpatient Medications   Medication Sig Dispense Refill   . acetaminophen (TYLENOL) 325 MG tablet Take 3 tablets (975 mg) by mouth every 8 hours. 90 tablet 1   . ALPRAZolam (XANAX) 0.25 MG tablet take 1 tablet by mouth once daily if needed     . diazepam (VALIUM) 5 MG tablet Take 1 tablet (5 mg) by mouth every 6 hours as needed for Insomnia or Muscle Spasms. 30 tablet 0   . docusate sodium (COLACE) 250 MG capsule Take 1 capsule (250 mg) by mouth at bedtime. 60 capsule 0   . gabapentin (NEURONTIN) 300 MG capsule Take 300 mg by mouth 3 times daily.     Marland Kitchen lidocaine (ASPERCREME) 4 % patch Apply 2 patches topically every 24 hours as needed (pain). Leave patch on for 12 hours, then remove for 12 hours. 30 patch 0   . naloxone (NARCAN) 4 mg/0.1 mL nasal spray For suspected opioid overdose, spray once in one nostril. CALL 911.  Repeat after 3 minutes if no or minimal response. 2 bottle 3   . ondansetron (ZOFRAN ODT) 4 MG disintegrating tablet Dissolve 1 tablet (4 mg) by mouth every 8 hours as needed for Nausea/Vomiting. 4 tablet 0   . oxyCODONE (ROXICODONE) 10 MG tablet Take 1 tablet (10 mg) by mouth every 3 hours as needed for Moderate Pain (Pain Score 4-6). 120 tablet 0   . polyethylene glycol (GLYCOLAX) 17 GM/SCOOP powder 17 g by Oral route.     . senna (SENOKOT) 8.6 MG tablet Take 2 tablets (17.2 mg) by mouth every morning. 60 tablet 0   . tiZANidine (ZANAFLEX) 4 MG tablet Take 1 tablet (4 mg) by mouth every 6 hours as needed (spasm). 20 tablet 0   . zolpidem (AMBIEN) 5 MG tablet Take 1 tablet (5 mg) by mouth nightly as needed for Insomnia. 30 tablet 0     No current facility-administered medications for this visit.      Allergies   Allergen Reactions   . Sulfa Drugs Nausea Only and Anxiety   . Tramadol Nausea and Vomiting       Social History     Socioeconomic History   . Marital status: Single     Spouse  name: Not on file   . Number of children: Not on file   . Years of education: Not on file   . Highest education level: Not on file   Occupational History   . Not on file   Social Needs   . Financial resource strain: Not on file   . Food  insecurity     Worry: Not on file     Inability: Not on file   . Transportation needs     Medical: Not on file     Non-medical: Not on file   Tobacco Use   . Smoking status: Never Smoker   . Smokeless tobacco: Never Used   Substance and Sexual Activity   . Alcohol use: Yes     Alcohol/week: 4.0 standard drinks     Types: 4 Glasses of wine per week     Comment: socially, couple drinks/week    . Drug use: Yes     Types: Cannabinoid (CBD)     Comment: edibles   . Sexual activity: Not Currently     Partners: Male     Birth control/protection: Pill, Abstinence   Lifestyle   . Physical activity     Days per week: Not on file     Minutes per session: Not on file   . Stress: Not on file   Relationships   . Social Wellsite geologist on phone: Not on file     Gets together: Not on file     Attends religious service: Not on file     Active member of club or organization: Not on file     Attends meetings of clubs or organizations: Not on file     Relationship status: Not on file   . Intimate partner violence     Fear of current or ex partner: Not on file     Emotionally abused: Not on file     Physically abused: Not on file     Forced sexual activity: Not on file   Other Topics Concern   . Military Service No   . Blood Transfusions No   . Occupational Exposure No   . Seat Belt Use Yes   Social History Narrative    Single    Psychotherapist - just beginning, in Artist. Went to school in [place].    Born in [place] and moved to [place] 6 years ago    Tour guide-food and history tours     Hobbies: beach volleyball, horse back riding, exercise        Opioid Risk Tool Score:             Family History   Problem Relation Name Age of Onset   . Diabetes Paternal Uncle     . Breast  Cancer Paternal Aunt     . Breast Cancer Mother Desi 17   . Cancer Mother Desi         Breast Cancer   . Diabetes Mother Desi         Pre-diabetic   . Thyroid Mother Desi         [place]   . Breast Cancer Paternal Grandmother     . Heart Disease Father Elijah Birk         Had some issue that was resolved   . Colon Cancer Neg Hx     . Hypertension Neg Hx     . Cholesterol/Lipid Disorder Neg Hx         Physical Exam:   Vitals: There were no vitals taken for this visit.  Constitutional: Vital signs listed above. Well-developed, well-nourished, and no distress  Psych: alert, oriented and responding inappropriately, oriented. Speech is clear/ normal. Affect is euthymic.  Eyes: Sclera white, conjunctiva clear, lids are without lag. Pupils equal, not pinpoint.  ENT:  Wearing facemask.   Skin: Skin color, texture, turgor normal. No rashes or lesions.  Musculoskeletal:    L-Spine    Flexion (normal 45):      full without pain  .  Unable to perform other physical exams due to video visits.           Labs and Imaging:  CT L spine without contrast on 10/07/2019:  IMPRESSION:  No acute fracture or dislocation of the lumbar spine.    Thin lucencies around both L4 screws, slightly more prominent than on prior study, suggesting micro motion. Posterior bone graft has not yet incorporated and is sparse. Anterior graft is incorporating, but not yet solidly fused. No change in posterior fluid collection at the surgical bed. No change in 4 mm L4/L5 anterolisthesis.    Assessment and Plan:  This is a 40 year old female with a history of chronic low back pain s/p revision XLIF L4-5 and PSIF L4-5 on 01/03/19, who recently underwent revision fusion L4-5 with Dr. Christa See on 11/02/19 here with low back pain. The pain has been persistent since the surgery on 2/25, which is roughly 1 month ago. Pt currently is taking adequate pain medications, however, the pain appears to be severe and limiting her activities. It is difficult to discern at this  point if the pain is related to post-operative changes vs. A secondary process since it has only been a month since the surgery. It would be ideal to defer other interventions for at least 3 months after a spinal fusion surgery. Patient currently appears to have good support system and is receiving the appropriate medications for pain management. After discussions on risks and benefits of interventions at the moment, pt was agreeable to wait for 3 months after the surgery to consider further interventional options.     Plan:   - f/u in 2 months (~ 3 months after the surgery) to discuss about future options on pain management    Today reviewed notes from prior visit(s) with [facility], reviewed note(s) from pain doctor PMR and reviewed note(s) from orthopedics      Risks applicable to today's encounter and procedural risks relevant to the patient include:   decision regarding procedure(s) with identified patient or procedure risk factors with identified patient or procedure risk factors    As the patient has not had an adequate trial of PT, recommend PT at this time.    Regarding the above medications, the [facility] is a consultation service with regard to medications and as such we do not take over the writing of routine prescriptions for patients. We are happy to make recommendations regarding pain medications for the patient's PCP to consider implementing.     Follow-up: 2 months    The encounter was staffed by Dr. Karin Lieu, who agrees with the assessment and plan for the patient    Hurshel Party) Baird Kay MD MS, PGY-2, Internal Medicine Categorical Program  [facility], [place] ([place])

## 2019-12-04 ENCOUNTER — Ambulatory Visit (HOSPITAL_BASED_OUTPATIENT_CLINIC_OR_DEPARTMENT_OTHER): Payer: BC Managed Care – PPO | Admitting: Orthopaedic Surgery of the Spine

## 2019-12-04 ENCOUNTER — Encounter (HOSPITAL_BASED_OUTPATIENT_CLINIC_OR_DEPARTMENT_OTHER): Payer: Self-pay | Admitting: Anesthesiology

## 2019-12-04 NOTE — Progress Notes (Signed)
ATTENDING SUPERVISION NOTE:  I have interviewed and examined the patient at [facility], and I have discussed my findings and recommendations with the patient and the trainee. I have reviewed the trainee's note including the history, medications, and physical examination.  I concur with the assessment and plan.    ASSESSMENT:  No diagnosis found.      COUNSELING & COORDINATION OF CARE:  This visit involved counseling and coordination of care that comprised more than 50% of the visit time. Today I spent 60 minutes total face-to-face time with the patient.  There were no barriers to patient education.

## 2019-12-06 ENCOUNTER — Encounter (INDEPENDENT_AMBULATORY_CARE_PROVIDER_SITE_OTHER): Payer: Self-pay

## 2019-12-06 NOTE — Progress Notes (Signed)
Spine Surgery Post Operative Visit    Surgery Date: 11/03/2019    Procedure: ROI and exploration of fusion L4-5    Subjective: The patient returns today now approximately 2 weeks following ROI as noted above. She states that her pain does feel better, but she is still having difficulty with pain control.  The patient is doing well and has no complaints. The patient rates her pain at   today. The patient denies problems with the incision or any fevers, chills or night sweats.     Objective: Examination of the incision reveals a healing without evidence of erythema or infection. Sensation is intact to light touch. Motor strength is 5/5 in both upper and lower extremities. Reflexes are symmetric. No pathologic reflexes.     Imaging: No new imaging    Impression: Patient doing well status post ROI and exploration of fusion.    Plan: We have again discussed post-operative instructions. Wound care instructions have again been provided. We will transition the patient to home PT and placed referral to pain management today to assist with pain management.. All of the patient's questions were answered. We will plan on seeing the patient back in the office in 4 weeks. If any problems develop prior to that time the patient will let us know and we can see her right away.      ---------------------(data below generated by Jennye Moccasin, MD)--------------------     Patient Verification & Telemedicine Consent & Financial Waiver:    1.   Identity: I have verified this patient's identity to be accurate.  2.   Consent: I verify consent has been secured in one of the following methods: (a) obtained written/ online attestation consent (via MyChartVideoVisit pathway), (b) the spoke-side provider has obtained verbal or written consent from patient/surrogate (if this is a "provider to provider" evaluation), or (c) in all other cases, I have personally obtained verbal consent from the patient/ surrogate (noting all elements below) to  perform this voluntary telemedicine evaluation (including obtaining history, performing examination and reviewing data provided by the patient).   The patient/ surrogate has the right to refuse this evaluation.  I have explained risks (including potential loss of confidentiality), benefits, alternatives, and the potential need for subsequent face to face care. Patient/ surrogate understands that there is a risk of medical inaccuracies given that our recommendations will be made based on reported data (and we must therefore assume this information is accurate).  Knowing that there is a risk that this information is not reported accurately, and that the telemedicine video, audio, or data feed may be incomplete, the patient agrees to proceed with evaluation and holds [place] harmless knowing these risks.  3.   Healthcare Team: The patient/ surrogate has been notified that other healthcare professionals (including students, residents and Metallurgist) may be involved in this audio-video evaluation.   All laws concerning confidentiality and patient access to medical records and copies of medical records apply to telemedicine.  4.   Privacy: If this is a Radiographer, therapeutic Visit, the patient/ surrogate has received the [place] Notice of Privacy Practices via E-Checkin process.  For all other video visit techniques, I have verbally provided the patient/ surrogate with the [place] in [place] (https://health.PodcastRanking.se.aspx) or Spanish (https://health.https://www.matthews.info/.aspx).  The patient/ surrogate acknowledges both being provided the NPP link, and has been offered to have the NPP mailed to the patient/ surrogate by [place] mail.  The patient/ surrogate has voiced understanding an acknowledgement of receipt of this NPP  web address.  If the patient/surrogate has elected to receive the NPP via [place] mail, I verify that the NPP will be sent promptly to the patient/surrogate via [place] mail.  5.   Capacity: I  have reviewed this above verification and consent paragraph with the patient/ surrogate and the patient is capacitated or has a surrogate. If the patient is not capacitated to understand the above, and no surrogate is available, since this is not an emergency evaluation, the visit will be rescheduled until such time that the patient can consent, or the surrogate is available to consent. If this is an emergency evaluation and the patient is not capacitated to understand the above, and no surrogate is available, I am proceeding with this evaluation as this is felt to be an emergency setting and no appropriate specialist is available at the bedside to perform these evaluations.  6.   Financial Waiver: If this is a Restaurant manager, fast food Visit, the patient has been made aware of the financial waiver via E-Checkin process.  For all other video visit techniques, an E-Checkin process is not performed.  As such, I have personally verbally informed the patient/ surrogate that this evaluation will be a billable encounter similar to an in-person clinic visit, and the patient/ surrogate has agreed to pay the fee for services rendered.  If we are billing insurance for the patient's telehealth visit, her out-of-pocket cost will be determined based on her plan and will be billed to her.  The patient/ surrogate has also been informed that if the patient does not have insurance or does not wish to use insurance, [place] [address] [employer] price for a primary care telehealth visit is $59.00 and specialist telehealth visit is $88.00.  I have further informed the patient/ surrogate that in the event the patient has additional services provided in conjunction with the specialty visit (Ex. Psychotherapy services), those services will be billed at the current rate less a 45% discount.  7.   Intra-State Location: The patient/ surrogate attests to understanding that if the patient accesses these services from a location outside of [place], that  the patient does so at the patient's own risk and initiative and that the patient is ultimately responsible for compliance with any laws or regulations associated with the patient's use.  8.   Specific Use:The patient/ surrogate understands that [facility] makes no representation that materials or servicesdelivered via telecommunication services, or listed on telemedicine websites, are appropriate or available for use in any other location.           Demographics:  Medical Record #: [id number]  Date: November 08, 2019  Patient Name: Haley Barrett  DOB: [birth year]  Age: 39 year old  Sex: female  Location: Home address on file     Evaluator(s):  Miangel Flom was evaluated by me today.    Clinic Location: [facility]  [address]  [place] [address]

## 2019-12-07 NOTE — Progress Notes (Signed)
Spine Surgery Post Operative Visit    Surgery Date: 11/03/2019    Procedure: ROI and exploration of fusion L4-5    Subjective: The patient returns today now approximately 2 weeks following ROI as noted above. She states that her pain does feel better, but she is still having difficulty with pain control.  She is starting to walk more and overall feels that her back is better, but it is too soon to tell. The patient is doing well and has no complaints. The patient rates her pain at 5 today. The patient denies problems with the incision or any fevers, chills or night sweats.     Objective: Examination of the incision reveals a healing without evidence of erythema or infection. Sensation is intact to light touch. Motor strength is 5/5 in both upper and lower extremities. Reflexes are symmetric. No pathologic reflexes.     Imaging: No new imaging    Impression: Patient doing well status post ROI and exploration of fusion.    Plan: We have again discussed post-operative instructions. Wound care instructions have again been provided. Provided reassurance. Explained to her that she will need a long and dedicated PT regimen to return to her baseline and provided encouragement to her. She does feel as though she is "better." She is planning on moving to [place] to be closer to her parents so that she does have some support and will work to engage in PT and HEP there. We will plan on seeing the patient back in the office in 4 weeks if she does stay in the [place] area. If any problems develop prior to that time the patient will let us know and we can see her right away.

## 2019-12-18 ENCOUNTER — Ambulatory Visit: Payer: BC Managed Care – PPO | Attending: Orthopaedic Surgery of the Spine | Admitting: Orthopaedic Surgery of the Spine

## 2019-12-18 DIAGNOSIS — Z09 Encounter for follow-up examination after completed treatment for conditions other than malignant neoplasm: Secondary | ICD-10-CM

## 2019-12-18 DIAGNOSIS — Z981 Arthrodesis status: Secondary | ICD-10-CM | POA: Insufficient documentation

## 2019-12-20 ENCOUNTER — Telehealth (INDEPENDENT_AMBULATORY_CARE_PROVIDER_SITE_OTHER): Payer: BC Managed Care – PPO | Admitting: Orthopaedic Surgery of the Spine

## 2019-12-21 ENCOUNTER — Telehealth (HOSPITAL_BASED_OUTPATIENT_CLINIC_OR_DEPARTMENT_OTHER): Payer: Self-pay | Admitting: Orthopaedic Surgery of the Spine

## 2019-12-21 DIAGNOSIS — M5416 Radiculopathy, lumbar region: Secondary | ICD-10-CM

## 2019-12-21 DIAGNOSIS — M545 Low back pain, unspecified: Secondary | ICD-10-CM

## 2019-12-21 NOTE — Telephone Encounter (Signed)
Pt requesting an order for ESI. She is staying in [place] right now. With her mother.  States pain is starting to come back.  Asking for recommendations for an MD in [place] for injection if possible.        No dictation  on file for [facility] 12/18/19

## 2019-12-22 ENCOUNTER — Encounter (HOSPITAL_BASED_OUTPATIENT_CLINIC_OR_DEPARTMENT_OTHER): Payer: Self-pay | Admitting: Physician Assistant

## 2019-12-22 NOTE — Telephone Encounter (Signed)
Per Dr Zlomislic OK for lumbar ESI.    Order entered for external pain clinic to perform lumbar ESI.  Pt will need to find a local pain clinic to do procedure as we do not have any recommendations for a particular office.

## 2019-12-22 NOTE — Progress Notes (Signed)
A user error has taken place: encounter opened in error, closed for administrative reasons.

## 2019-12-22 NOTE — Telephone Encounter (Signed)
Routed to [employer] covering for [employer]

## 2019-12-25 ENCOUNTER — Encounter (HOSPITAL_BASED_OUTPATIENT_CLINIC_OR_DEPARTMENT_OTHER): Payer: Self-pay | Admitting: Hospital

## 2019-12-25 NOTE — Telephone Encounter (Signed)
Pt informed of below advice from [place] via [employer]

## 2019-12-28 ENCOUNTER — Ambulatory Visit: Payer: BC Managed Care – PPO | Admitting: Pain Medicine

## 2019-12-28 DIAGNOSIS — R110 Nausea: Secondary | ICD-10-CM

## 2019-12-28 MED ORDER — PROCHLORPERAZINE MALEATE 5 MG OR TABS
5.0000 mg | ORAL_TABLET | Freq: Four times a day (QID) | ORAL | 11 refills | Status: AC | PRN
Start: 2019-12-28 — End: ?

## 2019-12-31 NOTE — Progress Notes (Signed)
Pain Clinic Telemedicine MyChart Video Visit    ---------------------(data below generated by Henrene Pastor, MD)--------------------    Patient Verification & Telemedicine Consent:    I am proceeding with this evaluation at the direct request of the patient.  I have verified this is the correct patient and have obtained verbal consent and written consent from the patient/ surrogate to perform this voluntary telemedicine evaluation (including obtaining history, performing examination and reviewing data provided by the patient).   The patient/ surrogate has the right to refuse this evaluation.  I have explained risks (including potential loss of confidentiality), benefits, alternatives, and the potential need for subsequent face to face care. Patient/ surrogate understands that there is a risk of medical inaccuracies given that our recommendations will be made based on reported data (and we must therefore assume this information is accurate).  Knowing that there is a risk that this information is not reported accurately, and that the telemedicine video, audio, or data feed may be incomplete, the patient agrees to proceed with evaluation and holds [place] harmless knowing these risks. In this evaluation, we will be providing recommendations only.  The ultimate decision to follow, or not follow, these recommendations will be left to the bedside treating/ requesting practitioner.  The patient/ surrogate has been notified that other healthcare professionals (including students, residents and Engineer, maintenance) may be involved in this audio-video evaluation.   All laws concerning confidentiality and patient access to medical records and copies of medical records apply to telemedicine.  The patient/ surrogate has received the [place] Notice of Privacy Practices.  I have reviewed this above verification and consent paragraph with the patient/ surrogate.  If the patient is not capacitated to understand the above, and no  surrogate is available, since this is not an emergency evaluation, the visit will be rescheduled until such time that the patient can consent, or the surrogate is available to consent.    Demographics:   Medical Record #: [id number]   Date: December 31, 2019   Patient Name: Haley Barrett   DOB: 09/23/1980  Age: 39 year old  Sex: female  Location: Patient currently in [place].       Evaluator(s):   Charnetta Wulff was evaluated by me today.    Clinic Location:  [facility]   [facility]  [address]  [place] [address]      Patient is now living in [place]. Discussed telemedicine policy that patient must be in the state of [place] for visit.

## 2020-01-01 NOTE — Progress Notes (Signed)
Spine Surgery Post Operative Visit    Surgery Date: 11/03/2019    Procedure: ROI and exploration of fusion L4-5    Subjective: The patient returns today now approximately 6 weeks following ROI as noted above. She is seen today by video telehealth as a result of covid pandemic. She has made some ongoing progress and appears to be feeling better and doing more, but states that she is still having difficulty with pain control.  She has relocated to [place] to be with her parents as she felt that she would be unable to be on her own. She is starting to walk more and overall feels that her back is better, but it is too soon to tell. The patient is doing well and has no complaints. The patient rates her pain at 5 today. The patient denies problems with the incision or any fevers, chills or night sweats.     Objective: Examination of the incision reveals a healing without evidence of erythema or infection. Sensation is intact to light touch. Motor strength is 5/5 in both upper and lower extremities. Reflexes are symmetric. No pathologic reflexes.     Imaging: No new imaging    Impression: Patient doing well status post ROI and exploration of fusion.    Plan: We have again discussed post-operative instructions. Wound care instructions have again been provided. Provided reassurance. Again reiterated the importance of a dedicated PT and rehab progream for long term recovery, although she thinks it is too soon now to proceed with PT given the difficulties with PT she has experienced in the past. For now will continue observation and encourage continued slow advancement of walking and basic HEP. She inquired about pain management and she may continue with pain management modalities as needed.  If any problems develop prior to that time the patient will let us know and we can see her right away.      ---------------------(data below generated by Jennye Moccasin, MD)--------------------     Patient Verification & Telemedicine  Consent & Financial Waiver:    1.   Identity: I have verified this patient's identity to be accurate.  2.   Consent: I verify consent has been secured in one of the following methods: (a) obtained written/ online attestation consent (via MyChartVideoVisit pathway), (b) the spoke-side provider has obtained verbal or written consent from patient/surrogate (if this is a "provider to provider" evaluation), or (c) in all other cases, I have personally obtained verbal consent from the patient/ surrogate (noting all elements below) to perform this voluntary telemedicine evaluation (including obtaining history, performing examination and reviewing data provided by the patient).   The patient/ surrogate has the right to refuse this evaluation.  I have explained risks (including potential loss of confidentiality), benefits, alternatives, and the potential need for subsequent face to face care. Patient/ surrogate understands that there is a risk of medical inaccuracies given that our recommendations will be made based on reported data (and we must therefore assume this information is accurate).  Knowing that there is a risk that this information is not reported accurately, and that the telemedicine video, audio, or data feed may be incomplete, the patient agrees to proceed with evaluation and holds [place] harmless knowing these risks.  3.   Healthcare Team: The patient/ surrogate has been notified that other healthcare professionals (including students, residents and Metallurgist) may be involved in this audio-video evaluation.   All laws concerning confidentiality and patient access to medical records and copies of medical records  apply to telemedicine.  4.   Privacy: If this is a Restaurant manager, fast food Visit, the patient/ surrogate has received the [place] Notice of Privacy Practices via E-Checkin process.  For all other video visit techniques, I have verbally provided the patient/ surrogate with the [place] web link in [place]  (https://health.dDotCom.si.aspx) or Spanish (https://health.LavishToys.ch.aspx).  The patient/ surrogate acknowledges both being provided the NPP link, and has been offered to have the NPP mailed to the patient/ surrogate by [place] mail.  The patient/ surrogate has voiced understanding an acknowledgement of receipt of this NPP web address.  If the patient/surrogate has elected to receive the NPP via [place] mail, I verify that the NPP will be sent promptly to the patient/surrogate via [place] mail.  5.   Capacity: I have reviewed this above verification and consent paragraph with the patient/ surrogate and the patient is capacitated or has a surrogate. If the patient is not capacitated to understand the above, and no surrogate is available, since this is not an emergency evaluation, the visit will be rescheduled until such time that the patient can consent, or the surrogate is available to consent. If this is an emergency evaluation and the patient is not capacitated to understand the above, and no surrogate is available, I am proceeding with this evaluation as this is felt to be an emergency setting and no appropriate specialist is available at the bedside to perform these evaluations.  6.   Financial Waiver: If this is a Restaurant manager, fast food Visit, the patient has been made aware of the financial waiver via E-Checkin process.  For all other video visit techniques, an E-Checkin process is not performed.  As such, I have personally verbally informed the patient/ surrogate that this evaluation will be a billable encounter similar to an in-person clinic visit, and the patient/ surrogate has agreed to pay the fee for services rendered.  If we are billing insurance for the patient's telehealth visit, her out-of-pocket cost will be determined based on her plan and will be billed to her.  The patient/ surrogate has also been informed that if the patient does not have insurance or does not wish to use insurance,  [place] [address] [employer] price for a primary care telehealth visit is $59.00 and specialist telehealth visit is $88.00.  I have further informed the patient/ surrogate that in the event the patient has additional services provided in conjunction with the specialty visit (Ex. Psychotherapy services), those services will be billed at the current rate less a 45% discount.  7.   Intra-State Location: The patient/ surrogate attests to understanding that if the patient accesses these services from a location outside of [place], that the patient does so at the patient's own risk and initiative and that the patient is ultimately responsible for compliance with any laws or regulations associated with the patient's use.  8.   Specific Use:The patient/ surrogate understands that [facility] makes no representation that materials or servicesdelivered via telecommunication services, or listed on telemedicine websites, are appropriate or available for use in any other location.           Demographics:  Medical Record #: [id number]  Date: December 18, 2019  Patient Name: Haley Barrett  DOB: Mar 02, 1981  Age: 39 year old  Sex: female  Location: Home address on file     Evaluator(s):  Preciosa Bundrick was evaluated by me today.    Clinic Location: [facility]   [facility]  [address]  [place] [address]

## 2020-01-10 ENCOUNTER — Telehealth (INDEPENDENT_AMBULATORY_CARE_PROVIDER_SITE_OTHER): Payer: BC Managed Care – PPO | Admitting: Orthopaedic Surgery of the Spine

## 2020-01-17 ENCOUNTER — Telehealth (INDEPENDENT_AMBULATORY_CARE_PROVIDER_SITE_OTHER): Payer: BC Managed Care – PPO | Admitting: Orthopaedic Surgery of the Spine

## 2020-01-17 ENCOUNTER — Telehealth (INDEPENDENT_AMBULATORY_CARE_PROVIDER_SITE_OTHER): Payer: Self-pay | Admitting: Orthopaedic Surgery of the Spine

## 2020-01-17 DIAGNOSIS — Z09 Encounter for follow-up examination after completed treatment for conditions other than malignant neoplasm: Secondary | ICD-10-CM

## 2020-01-17 DIAGNOSIS — G8929 Other chronic pain: Secondary | ICD-10-CM

## 2020-01-17 DIAGNOSIS — M545 Low back pain: Secondary | ICD-10-CM

## 2020-01-17 NOTE — Telephone Encounter (Signed)
Let patient know provider is running behind.  She would like a call when he is ready to log on.

## 2020-01-17 NOTE — Telephone Encounter (Signed)
Spoke with patient. Informed her that Dr Herma Carson will be calling her in about 15-20 minutes. Patient verbalized understanding.      By Loura Pardon

## 2020-01-23 NOTE — Progress Notes (Signed)
ORTHOPAEDIC SPINE SURGERY     Visit Type: Office Visit    Reason For Visit: No chief complaint on file.    Surgery Date: 01/03/2019    Procedure: ROI, revision XLIF L4-5 and PSIF L4-5     History Of Present Illness: 39 year old female returns to spine clinic now approximately 8 months s/p revision lumbar fusion L4-5 as noted above. She is seen today by video telehealth as a result of covid pandemic. She continues to have similar complaints of low back pain and describes a constellation of variable symptoms including pain extending up her back to the thoracic region, pain involving the lumbosacral region as well as pain and paresthesias into both legs. She remains tearful and inconsolable during the visit, and at other times appears to be comfortable and with no pain. She returns to discuss upcoming surgery plan. To review briefly, she states that she continues to have significant low back pain that is ongoing and limits her activities. At times she feels better, however she continues to have pain that is easily exacerbated by activities and states that she is at times unable to get out of bed as a results. She relates majority of her pain to the her low back but also complaints of generalized thoracolumbar back pain. She describes intermittent feelings of pain and weakness into bilateral lower extremities. She has no focal weakness, but describes feelings of her legs being unsteady at times.  She has undergone trial of PT, but that had resulted in exacerbation of her symptoms. Most recently, she had trial of si joint injections which did not provide her any relief of symptoms. She continues to take pain medications for pain control. To review briefly, she underwent wide lumbar laminectomy and instrumentation L4-5 in [place] about one year ago following which she continued to have severe pain, and ultimately proceeded with revision lumbar stabilization with fusion L4-5 as noted above. No issues with incisions. Patient  rates her pain as a  . She has had multiple attempts at injections without any sustained relief. Patient reports no changes in bowel and bladder function. Patient returns for routine follow up.    Allergies:   Allergies   Allergen Reactions   . Sulfa Drugs Nausea Only and Anxiety   . Tramadol Nausea and Vomiting       Medications:   Current Outpatient Medications   Medication Sig   . ALPRAZolam (XANAX) 0.25 MG tablet take 1 tablet by mouth once daily if needed   . docusate sodium (COLACE) 250 MG capsule Take 1 capsule (250 mg) by mouth at bedtime.   . gabapentin (NEURONTIN) 300 MG capsule Take 300 mg by mouth 3 times daily.   Marland Kitchen lidocaine (ASPERCREME) 4 % patch Apply 2 patches topically every 24 hours as needed (pain). Leave patch on for 12 hours, then remove for 12 hours.   Marland Kitchen oxyCODONE (ROXICODONE) 10 MG tablet Take 1 tablet (10 mg) by mouth every 3 hours as needed for Moderate Pain (Pain Score 4-6).   Marland Kitchen prochlorperazine (COMPAZINE) 5 MG tablet Take 1 tablet (5 mg) by mouth every 6 hours as needed for Nausea/Vomiting.   Marland Kitchen tiZANidine (ZANAFLEX) 4 MG tablet Take 1 tablet (4 mg) by mouth every 6 hours as needed (spasm).     No current facility-administered medications for this visit.          Review of Systems:  CONSTITUTIONAL: No unexpected weight loss, fevers, chills, or anorexia. SKIN: Noncontributory. GENITOURINARY: Symptoms as noted above  in HPI. NEUROLOGIC: Symptoms as noted above in HPI. MUSCULOSKELETAL: Symptoms as noted above in HPI.    Physical Examination:  There were no vitals taken for this visit.  CONSTITUTIONAL: Well appearing and well groomed. PSYCHOLOGICAL: Alert and oriented and appropriate to situation. INTEGUMENT: Intact. VASCULAR: Radial and pedal pulses are intact and symmetric. EXTREMITIES: Bilateral upper and lower extremities have good range of motion and no significant deformities. GAIT: Brisk with good coordination.     CERVICAL SPINE: Nontender to palpation. Spurling's test negative.  Sensation intact of the upper extremities.    RANGE OF MOTION (in degrees)       RT LT  Lateral motion   80  80  Lateral bend   45  45  Flexion 40 degrees. Extension 35 degrees. Motion in all planes is nonpainful.    MOTOR    RT LT  Trapezius   5/5  5/5  Deltoid    5/5  5/5  Bicep    5/5  5/5  Tricep    5/5  5/5  Wrist flexor   5/5  5/5  Wrist extensor  5/5  5/5  Intrinsics   5/5  5/5  Grip    5/5  5/5    DEEP TENDON REFLEXES       RT  LT  Bicep    2+  2+  Brachioradialis  2+  2+  Tricep    2+  2+    THORACIC SPINE  Nontender to percussion. Alignment - normal, no paravertebral muscle fullness. Sensation intact. Beevor's negative. Abdominal reflexes are absent and symmetric.     LUMBAR SPINE  Left lumbosacral, paraspinal and SI joint tenderness to palpation. Heel to toe walk with difficulty due to pain. Sensation intact of the lower extremities. Trendelenburg sign negative. Skin intact.    RANGE OF MOTION        RT  LT  lateral bend, normal at 25 degrees 25  25  rotation, normal at 30 degrees 30  30  Flexion - normal at 40 degrees. Extension - normal at 10 degrees.    MOTOR STRENGTH       RT  LT  Iliopsoas   5/5  5/5  Quadricep   5/5  5/5  Anterior tibialis   5/5  5/5  Extensor hallucis longus 5/5  5/5  Gastrocsoleus  5/5  5/5    DEEP TENDON REFLEXES       RT  LT  Patellar   2+  2+  Achilles   2+  2+    PATHOLOGIC REFLEXES      RT LT  Clonus    Neg Neg  Babinski   Neg Neg  Hoffmans   Neg Neg    STRAIGHT LEG RAISING       RT  LT  Sitting @ 90 degrees   Neg Neg    IMAGING STUDIES:   X-ray Lumbosacral Spine 2 Or 3 Views    Result Date: 06/14/2019  IMPRESSION: Posterior decompression and combined anterior and posterior instrumented fixation at L4-L5 without evidence of hardware complication. 7 mm anterolisthesis of L4 over L5 without change between the lateral flexion-extension views.    X-ray Lumbosacral Spine 2 Or 3 Views    Result Date: 06/14/2019  IMPRESSION: No interval change. Status post L4-L5 pedicle screws  instrumentation, anterior interbody grafting and posterior bone grafting after decompression without change in alignment or hardware complication.    CT lumbar spine shows:  Posterior decompression and instrumented fixation at  L4-L5 and placement of an interbody graft at L4-L5.    Minimal lucency surrounding the L4 transpedicular screws may represent micro motion. The interbody graft is not fully incorporated.    4 mm anterolisthesis of L4 on L5.    Persistent fluid collection in the surgical bed, similar to the MRI of 04/18/2019 may represent a seroma. Sampling of the fluid collection could be obtained if there is a clinical concern about an infection.        OTHER MEDICAL RECORDS/IMAGING STUDIES REVIEWED: None    IMPRESSION:   S/p lumbar fusion  Chronic low back pain     PLAN/DISCUSSION: Reviewed clinical history as well as exam results and imaging findings with patient in detail. Review of prior imaging again shows intact instrumentation with near anatomic alignment, with slight residual spondylolisthesis, without evidence of any significant stenosis. There is a seroma in the laminectomy bed. There is minimal lucency around pedicle screw at L4 without frank halo formation, likely related to reinstrumentation given revision surgery following an index instrumentation procedure in [place] that resulted in severe disability and pain.  In addition graft does appear to be incorporating without evidence of subsidence or lucency.  She does continue to have symptoms of severe low back pain, which at times is near completely incapacitating, and which seems out of proportion to her imaging findings, and continues to have intermittent radicular type complaints, though no evidence of stenosis on imaging, and therefore etiology of her symptoms is not entirely clear. At her index surgery in [place] there was complete resection of posterior elements violating the level at L3-4 and she has associated pain and "feelings of  instability" at this level just above her fusion, however she has not had relief with injections.  We discussed options at length. We have discussed possibility of need to extend fusion to L3 though given the nonspecific nature of her low back pain and pain that does not correlate with her imaging findings, would not recommend further fusion level. She states that she is unable to engage in any PT because it causes too much pain and is interested in surgical address. We have described options a length. She has refused prior attempts at discogram to assess pain at the L5-S1 level. We have discussed additional diagnostic facet blocks at L5-S1 to assess possible contribution of pain from this level. She does endorse history of metal sensitivities and we again discussed with her the possibility that instrumentation be potentially be the source of her pain. Given the multifactorial and likely psychosomatic component of her pain, we have discussed surgical plan would involve wound exploration with exploration of fusion, evacuation of seroma, removal of retained instrumentation and likely in situ fusion if there is concern for instability, though this does not appear likely. We have explicitly discussed with her that there will not be any extension of fusion to additional levels. Plan is to proceed with I&D and removal of instrumentation, exploration of fusion. We have previously discussed the planned procedure as well as r/b/a in detail. I have described the surgery / procedure in detail and all the patient's questions were answered. Nivea Rominger understood the possible complications of surgery /  procedure to include but not limited to: ongoing pain, numbness in the extremities, weakness, nerve injury, dural tear, infection, paralysis, spinal cord injury, need for further surgery, prolonged recuperation, bleeding, need for blood transfusions which includes the risk of a blood reaction, infection or disease, medical  complications including pneumonia, heart attack, stroke, deep  vein thrombosis, pulmonary embolism, blood vessel injury, anesthesia complications and even death.  There is a risk of unsuccessful results from either foreseen or unforeseen causes. Having understood the risks and benefits the patient decided to proceed with surgery / procedure.  Over 60 minutes were spent in discussion and formulation of plan and counseling with patient including review of all options in detail. All questions were answered and Monik Lins understood and was satisfied with this plan.     FOLLOWUP: Return to clinic:six weeks. The patient is encouraged to call us with any questions or problems in the interim. Our contact numbers were given to the patient.      ---------------------(data below generated by Hyman Hopes, MD)--------------------     Patient Verification & Telemedicine Consent & Financial Waiver:    1.   Identity: I have verified this patient's identity to be accurate.  2.   Consent: I verify consent has been secured in one of the following methods: (a) obtained written/ online attestation consent (via MyChartVideoVisit pathway), (b) the spoke-side provider has obtained verbal or written consent from patient/surrogate (if this is a "provider to provider" evaluation), or (c) in all other cases, I have personally obtained verbal consent from the patient/ surrogate (noting all elements below) to perform this voluntary telemedicine evaluation (including obtaining history, performing examination and reviewing data provided by the patient).   The patient/ surrogate has the right to refuse this evaluation.  I have explained risks (including potential loss of confidentiality), benefits, alternatives, and the potential need for subsequent face to face care. Patient/ surrogate understands that there is a risk of medical inaccuracies given that our recommendations will be made based on reported data (and we must therefore assume this  information is accurate).  Knowing that there is a risk that this information is not reported accurately, and that the telemedicine video, audio, or data feed may be incomplete, the patient agrees to proceed with evaluation and holds [place] harmless knowing these risks.  3.   Healthcare Team: The patient/ surrogate has been notified that other healthcare professionals (including students, residents and Engineer, maintenance) may be involved in this audio-video evaluation.   All laws concerning confidentiality and patient access to medical records and copies of medical records apply to telemedicine.  4.   Privacy: If this is a Restaurant manager, fast food Visit, the patient/ surrogate has received the [place] Notice of Privacy Practices via E-Checkin process.  For all other video visit techniques, I have verbally provided the patient/ surrogate with the [facility] web link in [place] (https://health.dDotCom.si.aspx) or Spanish (https://health.LavishToys.ch.aspx).  The patient/ surrogate acknowledges both being provided the NPP link, and has been offered to have the NPP mailed to the patient/ surrogate by [place] mail.  The patient/ surrogate has voiced understanding an acknowledgement of receipt of this NPP web address.  If the patient/surrogate has elected to receive the NPP via [place] mail, I verify that the NPP will be sent promptly to the patient/surrogate via [place] mail.  5.   Capacity: I have reviewed this above verification and consent paragraph with the patient/ surrogate and the patient is capacitated or has a surrogate. If the patient is not capacitated to understand the above, and no surrogate is available, since this is not an emergency evaluation, the visit will be rescheduled until such time that the patient can consent, or the surrogate is available to consent. If this is an emergency evaluation and the patient is not capacitated to understand the above, and no surrogate is  available, I am proceeding with this  evaluation as this is felt to be an emergency setting and no appropriate specialist is available at the bedside to perform these evaluations.  6.   Financial Waiver: If this is a Restaurant manager, fast food Visit, the patient has been made aware of the financial waiver via E-Checkin process.  For all other video visit techniques, an E-Checkin process is not performed.  As such, I have personally verbally informed the patient/ surrogate that this evaluation will be a billable encounter similar to an in-person clinic visit, and the patient/ surrogate has agreed to pay the fee for services rendered.  If we are billing insurance for the patient's telehealth visit, her out-of-pocket cost will be determined based on her plan and will be billed to her.  The patient/ surrogate has also been informed that if the patient does not have insurance or does not wish to use insurance, [facility] [address] [employer] price for a primary care telehealth visit is $59.00 and specialist telehealth visit is $88.00.  I have further informed the patient/ surrogate that in the event the patient has additional services provided in conjunction with the specialty visit (Ex. Psychotherapy services), those services will be billed at the current rate less a 45% discount.  7.   Intra-State Location: The patient/ surrogate attests to understanding that if the patient accesses these services from a location outside of [place], that the patient does so at the patient's own risk and initiative and that the patient is ultimately responsible for compliance with any laws or regulations associated with the patient's use.  8.   Specific Use:The patient/ surrogate understands that [facility] makes no representation that materials or servicesdelivered via telecommunication services, or listed on telemedicine websites, are appropriate or available for use in any other location.           Demographics:  Medical Record #: [id number]  Date: October 18, 2019  Patient Name: Haley Barrett  DOB: 04-Nov-1980  Age: 39 year old  Sex: female  Location: Home address on file     Evaluator(s):  Phelan Goers was evaluated by me today.    Clinic Location: [facility]  [facility]  [address]  [place] [address]

## 2020-01-30 ENCOUNTER — Other Ambulatory Visit: Payer: Self-pay

## 2020-01-31 ENCOUNTER — Ambulatory Visit (HOSPITAL_BASED_OUTPATIENT_CLINIC_OR_DEPARTMENT_OTHER): Payer: BC Managed Care – PPO | Admitting: Anesthesiology

## 2020-02-01 NOTE — Progress Notes (Signed)
Spine Surgery Post Operative Visit    Surgery Date: 11/03/2019    Procedure: ROI and exploration of fusion L4-5    Subjective: The patient returns today now approximately 11 weeks following ROI as noted above. She is seen today by video telehealth as a result of covid pandemic. She states that she continues to slowly feel a bit better, however has continued intermittent complaints of low back pain. She remains in [place] and has had ongoing follow up with pain management there. She had recent repeat imaging and has been informed that her symptoms are likely related to her L5-S1 level. She has been referred to a spine surgeon there for discussion of options.  The patient rates her pain at   today. The patient denies problems with the incision or any fevers, chills or night sweats.     Objective: Examination of the incision reveals a healing without evidence of erythema or infection. Sensation is intact to light touch. Motor strength is 5/5 in both upper and lower extremities. Reflexes are symmetric. No pathologic reflexes.     Imaging: No new imaging available for review    Impression: Patient doing well status post ROI and exploration of fusion.    Plan: We have again discussed post-operative instructions. Wound care instructions have again been provided. Provided reassurance. We have discussed the ongoing progression of PT as she has had meaningful progress which she does acknowledge although she does have ongoing pain. She is following up with outside spine surgeon to discuss possible surgical options for her L5-S1 level which may be contributing to her pain. Continue HEP in the interim. She may follow up prn.  If any problems develop prior to that time the patient will let us know and we can see her right away.      ---------------------(data below generated by Jennye Moccasin, MD)--------------------     Patient Verification & Telemedicine Consent & Financial Waiver:    1.   Identity: I have verified this  patient's identity to be accurate.  2.   Consent: I verify consent has been secured in one of the following methods: (a) obtained written/ online attestation consent (via MyChartVideoVisit pathway), (b) the spoke-side provider has obtained verbal or written consent from patient/surrogate (if this is a "provider to provider" evaluation), or (c) in all other cases, I have personally obtained verbal consent from the patient/ surrogate (noting all elements below) to perform this voluntary telemedicine evaluation (including obtaining history, performing examination and reviewing data provided by the patient).   The patient/ surrogate has the right to refuse this evaluation.  I have explained risks (including potential loss of confidentiality), benefits, alternatives, and the potential need for subsequent face to face care. Patient/ surrogate understands that there is a risk of medical inaccuracies given that our recommendations will be made based on reported data (and we must therefore assume this information is accurate).  Knowing that there is a risk that this information is not reported accurately, and that the telemedicine video, audio, or data feed may be incomplete, the patient agrees to proceed with evaluation and holds [place] harmless knowing these risks.  3.   Healthcare Team: The patient/ surrogate has been notified that other healthcare professionals (including students, residents and Metallurgist) may be involved in this audio-video evaluation.   All laws concerning confidentiality and patient access to medical records and copies of medical records apply to telemedicine.  4.   Privacy: If this is a Radiographer, therapeutic Visit, the patient/ surrogate has  received the [place] Notice of Privacy Practices via E-Checkin process.  For all other video visit techniques, I have verbally provided the patient/ surrogate with the [facility] web link in [place] (https://health.dDotCom.si.aspx) or Spanish  (https://health.LavishToys.ch.aspx).  The patient/ surrogate acknowledges both being provided the NPP link, and has been offered to have the NPP mailed to the patient/ surrogate by [place] mail.  The patient/ surrogate has voiced understanding an acknowledgement of receipt of this NPP web address.  If the patient/surrogate has elected to receive the NPP via [place] mail, I verify that the NPP will be sent promptly to the patient/surrogate via [place] mail.  5.   Capacity: I have reviewed this above verification and consent paragraph with the patient/ surrogate and the patient is capacitated or has a surrogate. If the patient is not capacitated to understand the above, and no surrogate is available, since this is not an emergency evaluation, the visit will be rescheduled until such time that the patient can consent, or the surrogate is available to consent. If this is an emergency evaluation and the patient is not capacitated to understand the above, and no surrogate is available, I am proceeding with this evaluation as this is felt to be an emergency setting and no appropriate specialist is available at the bedside to perform these evaluations.  6.   Financial Waiver: If this is a Restaurant manager, fast food Visit, the patient has been made aware of the financial waiver via E-Checkin process.  For all other video visit techniques, an E-Checkin process is not performed.  As such, I have personally verbally informed the patient/ surrogate that this evaluation will be a billable encounter similar to an in-person clinic visit, and the patient/ surrogate has agreed to pay the fee for services rendered.  If we are billing insurance for the patient's telehealth visit, her out-of-pocket cost will be determined based on her plan and will be billed to her.  The patient/ surrogate has also been informed that if the patient does not have insurance or does not wish to use insurance, [facility] [address] [employer] price for a primary care  telehealth visit is $59.00 and specialist telehealth visit is $88.00.  I have further informed the patient/ surrogate that in the event the patient has additional services provided in conjunction with the specialty visit (Ex. Psychotherapy services), those services will be billed at the current rate less a 45% discount.  7.   Intra-State Location: The patient/ surrogate attests to understanding that if the patient accesses these services from a location outside of [place], that the patient does so at the patient's own risk and initiative and that the patient is ultimately responsible for compliance with any laws or regulations associated with the patient's use.  8.   Specific Use:The patient/ surrogate understands that [employer] makes no representation that materials or servicesdelivered via telecommunication services, or listed on telemedicine websites, are appropriate or available for use in any other location.           Demographics:  Medical Record #: [id number]  Date: Jan 17, 2020  Patient Name: Haley Barrett  DOB: 1981-03-28  Age: 39 year old  Sex: female  Location: Home address on file     Evaluator(s):  Olayinka Gathers was evaluated by me today.    Clinic Location: [facility]    [facility]  [address]  [place] [address]

## 2020-02-14 ENCOUNTER — Other Ambulatory Visit: Payer: Self-pay

## 2020-02-19 ENCOUNTER — Ambulatory Visit (HOSPITAL_BASED_OUTPATIENT_CLINIC_OR_DEPARTMENT_OTHER): Payer: BC Managed Care – PPO | Admitting: Orthopaedic Surgery of the Spine

## 2020-02-21 ENCOUNTER — Telehealth (INDEPENDENT_AMBULATORY_CARE_PROVIDER_SITE_OTHER): Payer: BC Managed Care – PPO | Admitting: Orthopaedic Surgery of the Spine

## 2020-02-24 ENCOUNTER — Encounter (HOSPITAL_BASED_OUTPATIENT_CLINIC_OR_DEPARTMENT_OTHER): Payer: Self-pay | Admitting: Orthopaedic Surgery of the Spine

## 2020-02-27 NOTE — Telephone Encounter (Signed)
From: [name]  To: [name], MD  Sent: 02/24/2020 85:02 PM PDT  Subject: 20-Other    Hi I'm sending some attachments of my new images. I will send the other two documents in the next email.

## 2020-02-27 NOTE — Telephone Encounter (Signed)
From: [name]  To: [name], MD  Sent: 02/24/2020 62:83 PM PDT  Subject: 20-Other    Hi I have attached two more reports.    Thank you!  [name]

## 2020-02-28 ENCOUNTER — Other Ambulatory Visit: Payer: Self-pay

## 2020-02-28 ENCOUNTER — Encounter (HOSPITAL_BASED_OUTPATIENT_CLINIC_OR_DEPARTMENT_OTHER): Payer: Self-pay | Admitting: Orthopaedic Surgery of the Spine

## 2020-02-29 NOTE — Telephone Encounter (Signed)
From: [name]  To: [name], MD  Sent: 02/28/2020 9:73 PM PDT  Subject: 20-Other    Hi thanks Haley, yes I'm hoping to have him review the images. I'm not in [place] right now so I don't have a way to bring the cds to the office that's why I uploaded them and was hoping that would be ok. I also emailed the reports to him and have a video visit set up already for Monday I believe.    Let me know if that will work.   Thanks,  Haley Barrett      ----- Message -----   From:Haley Tracie Harrier, Haley Barrett   Sent:02/28/2020 3:19 PM PDT   [name]   Subject:RE: 20-Other    HI Reem,  If you are looking for Dr. Herma Carson to review the images, please send a CD over to our office. If you would just like him to comment on the reports please schedule a video visit to over the reports. Our office number is [phone].  Best regards,  [name]      ----- Message -----   From:Haley Barrett   Sent:02/24/2020 12:28 PM PDT   [name], MD   Subject:20-Other    Hi I have attached two more reports.    Thank you!  Haley Barrett

## 2020-03-04 ENCOUNTER — Encounter (HOSPITAL_BASED_OUTPATIENT_CLINIC_OR_DEPARTMENT_OTHER): Payer: Self-pay

## 2020-03-04 ENCOUNTER — Ambulatory Visit (HOSPITAL_BASED_OUTPATIENT_CLINIC_OR_DEPARTMENT_OTHER): Payer: BC Managed Care – PPO | Admitting: Orthopaedic Surgery of the Spine

## 2020-03-04 DIAGNOSIS — M545 Low back pain: Secondary | ICD-10-CM

## 2020-03-04 DIAGNOSIS — G8929 Other chronic pain: Secondary | ICD-10-CM

## 2020-03-15 ENCOUNTER — Encounter (HOSPITAL_BASED_OUTPATIENT_CLINIC_OR_DEPARTMENT_OTHER): Payer: Self-pay | Admitting: Orthopaedic Surgery of the Spine

## 2020-03-15 NOTE — Telephone Encounter (Signed)
From: [name]  To: [name], MD  Sent: 03/15/2020 11:54 AM PDT  Subject: 2-Procedural Question    Hi Doctor Z and staff,    Doctor Z said I could email him here to ask him for possible recommendations of surgeons in [place] since that's where i'm staying with family right now. If I could any recommendations when he had a chance that would be great. I still don't know which level fusion to do next as the same kind of pain is coming back higher in my back as well as below, but he said to let him know if I decided to go ahead with surgery and that's the decision I'm leaning towards right now (vs. the spinal cord stimulator.)    Thanks,  Haley Barrett

## 2020-03-19 NOTE — Progress Notes (Signed)
ORTHOPAEDIC SPINE SURGERY     Visit Type: Office Visit    Reason For Visit: MyChart    Surgery Date: 11/03/2019    Procedure: ROI and exploration of fusion L4-5       History Of Present Illness: 39 year old female returns to spine clinic for follow up evaluation of complaints of low back pain. She is now over one year s/p revision lumbar fusion with ROI. She is living in [place] with her parents and continues to have generalized and what she states remains incapacitating low back pain. Her pain has remained relatively unchanged despite multiple interventions. She has not engaged in any extensive PT regimen on account of low back pain. She currently is in discussion with pain management as well as a local spine surgeon about possible SCS vs revision lumbar fusion. She states she is not as interested in SCS because a psych evaluation is required.   Patient rates her pain as  . Pain remains the same as initial visit. No relief with extensive interventions including PT, injections, and multiple surgeries.  Patient reports no changes in bowel and bladder function. Patient returns to for follow up.    Allergies:   Allergies   Allergen Reactions    Sulfa Drugs Nausea Only and Anxiety    Tramadol Nausea and Vomiting       Medications:   Current Outpatient Medications   Medication Sig    ALPRAZolam (XANAX) 0.25 MG tablet take 1 tablet by mouth once daily if needed    docusate sodium (COLACE) 250 MG capsule Take 1 capsule (250 mg) by mouth at bedtime.    gabapentin (NEURONTIN) 300 MG capsule Take 300 mg by mouth 3 times daily.    lidocaine (ASPERCREME) 4 % patch Apply 2 patches topically every 24 hours as needed (pain). Leave patch on for 12 hours, then remove for 12 hours.    oxyCODONE (ROXICODONE) 10 MG tablet Take 1 tablet (10 mg) by mouth every 3 hours as needed for Moderate Pain (Pain Score 4-6).    prochlorperazine (COMPAZINE) 5 MG tablet Take 1 tablet (5 mg) by mouth every 6 hours as needed for Nausea/Vomiting.     tiZANidine (ZANAFLEX) 4 MG tablet Take 1 tablet (4 mg) by mouth every 6 hours as needed (spasm).     No current facility-administered medications for this visit.         Review of Systems:  CONSTITUTIONAL: No unexpected weight loss, fevers, chills, or anorexia. SKIN: Noncontributory. GENITOURINARY: Symptoms as noted above in HPI. NEUROLOGIC: Symptoms as noted above in HPI. MUSCULOSKELETAL: Symptoms as noted above in HPI.    Physical Examination:  There were no vitals taken for this visit.  CONSTITUTIONAL: Well appearing and well groomed. PSYCHOLOGICAL: Alert and oriented and appropriate to situation. INTEGUMENT: Intact. VASCULAR: Radial and pedal pulses are intact and symmetric. EXTREMITIES: Bilateral upper and lower extremities have good range of motion and no significant deformities. GAIT: Brisk with good coordination.     CERVICAL SPINE: Nontender to palpation. Spurling's test negative. Sensation intact of the upper extremities.    RANGE OF MOTION (in degrees)       RT LT  Lateral motion   80  80  Lateral bend   45  45  Flexion 40 degrees. Extension 35 degrees. Motion in all planes is nonpainful.    MOTOR    RT LT  Trapezius   5/5  5/5  Deltoid    5/5  5/5  Bicep  5/5  5/5  Tricep    5/5  5/5  Wrist flexor   5/5  5/5  Wrist extensor  5/5  5/5  Intrinsics   5/5  5/5  Grip    5/5  5/5    DEEP TENDON REFLEXES       RT  LT  Bicep    2+  2+  Brachioradialis  2+  2+  Tricep    2+  2+    THORACIC SPINE  Nontender to percussion. Alignment - normal, no paravertebral muscle fullness. Sensation intact. Beevor's negative. Abdominal reflexes are absent and symmetric.     LUMBAR SPINE  Nontender to percussion. Heel to toe walk without deficit. Sensation intact of the lower extremities. Trendelenburg sign negative. Skin intact.    RANGE OF MOTION        RT  LT  lateral bend, normal at 25 degrees 25  25  rotation, normal at 30 degrees 30  30  Flexion - normal at 40 degrees. Extension - normal at 10 degrees.    MOTOR  STRENGTH       RT  LT  Iliopsoas   5/5  5/5  Quadricep   5/5  5/5  Anterior tibialis   5/5  5/5  Extensor hallucis longus 5/5  5/5  Gastrocsoleus  5/5  5/5    DEEP TENDON REFLEXES       RT  LT  Patellar   2+  2+  Achilles   2+  2+    PATHOLOGIC REFLEXES      RT LT  Clonus    Neg Neg  Babinski   Neg Neg  Hoffmans   Neg Neg    STRAIGHT LEG RAISING       RT  LT  Sitting @ 90 degrees   Neg Neg    IMAGING STUDIES: No new imaging.    OTHER MEDICAL RECORDS/IMAGING STUDIES REVIEWED: None    IMPRESSION: Low back pain. H/o multiple lumbar surgeries.     PLAN/DISCUSSION: Reviewed clinical history as well as exam results and imaging findings with patient in detail. Recommend ongoing discussion with her pain management and spine surgery providers who should be coordinating her care given her relocation. The plan is for observation. All questions were answered and Haley HoehnMegan Barrett understood and was satisfied with this plan.     FOLLOWUP: Return to clinic: prn. The patient is encouraged to call us with any questions or problems in the interim. Our contact numbers were given to the patient.      ---------------------(data below generated by Hyman HopesVinko Yaacov Koziol, MD)--------------------     Patient Verification & Telemedicine Consent & Financial Waiver:    1.   Identity: I have verified this patient's identity to be accurate.  2.   Consent: I verify consent has been secured in one of the following methods: (a) obtained written/ online attestation consent (via MyChartVideoVisit pathway), (b) the spoke-side provider has obtained verbal or written consent from patient/surrogate (if this is a "provider to provider" evaluation), or (c) in all other cases, I have personally obtained verbal consent from the patient/ surrogate (noting all elements below) to perform this voluntary telemedicine evaluation (including obtaining history, performing examination and reviewing data provided by the patient).   The patient/ surrogate has the right to refuse  this evaluation.  I have explained risks (including potential loss of confidentiality), benefits, alternatives, and the potential need for subsequent face to face care. Patient/ surrogate understands that there is a risk of  medical inaccuracies given that our recommendations will be made based on reported data (and we must therefore assume this information is accurate).  Knowing that there is a risk that this information is not reported accurately, and that the telemedicine video, audio, or data feed may be incomplete, the patient agrees to proceed with evaluation and holds [place] harmless knowing these risks.  3.   Healthcare Team: The patient/ surrogate has been notified that other healthcare professionals (including students, residents and Engineer, maintenance) may be involved in this audio-video evaluation.   All laws concerning confidentiality and patient access to medical records and copies of medical records apply to telemedicine.  4.   Privacy: If this is a Restaurant manager, fast food Visit, the patient/ surrogate has received the [place] Notice of Privacy Practices via E-Checkin process.  For all other video visit techniques, I have verbally provided the patient/ surrogate with the [facility] web link in [place] (https://health.dDotCom.si.aspx) or Spanish (https://health.LavishToys.ch.aspx).  The patient/ surrogate acknowledges both being provided the NPP link, and has been offered to have the NPP mailed to the patient/ surrogate by [place] mail.  The patient/ surrogate has voiced understanding an acknowledgement of receipt of this NPP web address.  If the patient/surrogate has elected to receive the NPP via [place] mail, I verify that the NPP will be sent promptly to the patient/surrogate via [place] mail.  5.   Capacity: I have reviewed this above verification and consent paragraph with the patient/ surrogate and the patient is capacitated or has a surrogate. If the patient is not capacitated to understand the  above, and no surrogate is available, since this is not an emergency evaluation, the visit will be rescheduled until such time that the patient can consent, or the surrogate is available to consent. If this is an emergency evaluation and the patient is not capacitated to understand the above, and no surrogate is available, I am proceeding with this evaluation as this is felt to be an emergency setting and no appropriate specialist is available at the bedside to perform these evaluations.  6.   Financial Waiver: If this is a Restaurant manager, fast food Visit, the patient has been made aware of the financial waiver via E-Checkin process.  For all other video visit techniques, an E-Checkin process is not performed.  As such, I have personally verbally informed the patient/ surrogate that this evaluation will be a billable encounter similar to an in-person clinic visit, and the patient/ surrogate has agreed to pay the fee for services rendered.  If we are billing insurance for the patient's telehealth visit, her out-of-pocket cost will be determined based on her plan and will be billed to her.  The patient/ surrogate has also been informed that if the patient does not have insurance or does not wish to use insurance, [place] [address] [employer] price for a primary care telehealth visit is $59.00 and specialist telehealth visit is $88.00.  I have further informed the patient/ surrogate that in the event the patient has additional services provided in conjunction with the specialty visit (Ex. Psychotherapy services), those services will be billed at the current rate less a 45% discount.  7.   Intra-State Location: The patient/ surrogate attests to understanding that if the patient accesses these services from a location outside of [place], that the patient does so at the patient's own risk and initiative and that the patient is ultimately responsible for compliance with any laws or regulations associated with the patient's use.  8.  Specific Use:The patient/ surrogate understands that [facility] makes no representation that materials or servicesdelivered via telecommunication services, or listed on telemedicine websites, are appropriate or available for use in any other location.           Demographics:  Medical Record #: [id number]  Date: March 04, 2020  Patient Name: Haley Barrett  DOB: [birth year]  Age: 39 year old  Sex: female  Location: Home address on file     Evaluator(s):  Pasqualina Colasurdo was evaluated by me today.    Clinic Location: [facility]   [facility]  [address]  [place]ie [place] [address]

## 2020-07-01 ENCOUNTER — Ambulatory Visit (HOSPITAL_BASED_OUTPATIENT_CLINIC_OR_DEPARTMENT_OTHER): Payer: BLUE CROSS/BLUE SHIELD | Admitting: Orthopaedic Surgery of the Spine

## 2020-07-15 ENCOUNTER — Ambulatory Visit
Payer: BLUE CROSS/BLUE SHIELD | Attending: Orthopaedic Surgery of the Spine | Admitting: Orthopaedic Surgery of the Spine

## 2020-07-15 ENCOUNTER — Encounter (HOSPITAL_BASED_OUTPATIENT_CLINIC_OR_DEPARTMENT_OTHER): Payer: Self-pay | Admitting: Orthopaedic Surgery of the Spine

## 2020-07-15 DIAGNOSIS — M545 Low back pain, unspecified: Secondary | ICD-10-CM | POA: Insufficient documentation

## 2020-07-15 DIAGNOSIS — G8929 Other chronic pain: Secondary | ICD-10-CM | POA: Insufficient documentation

## 2020-07-15 NOTE — Patient Instructions (Signed)
Hello, my name is Tevon Berhane. I am the Medical Assistant who helped you out today.     It was a pleasure assisting you today. If you have any questions please feel free to call us at [phone].     We are committed to making sure our patient receive the best care. Satisfaction surveys are now randomly being sent out to patients after their visits. These surveys will be received via text, e-mail and or in the mail.  If you happen to get one, we'd greatly appreciate you taking a few minutes to fill it out to let us know what you like best about your visit and if there is anything we can improve. We use this information to be sure you are receiving the best care, as well as to recognize staff members who do a great job.    Thank you for using [facility] for your healthcare needs. Have a great day!

## 2020-07-16 NOTE — Telephone Encounter (Signed)
From: [name]  To: [name], MD  Sent: 07/15/2020 5:11 PM PST  Subject: Dr. Jill Alexanders info    Hi, could you please send along to Doctor Z this doctors info? I gave it to him on the phone but didnt know his first name:    Its Dr. Lenore Manner, MD at [facility] in [place].  [phone]    Oh and I forgot to ask but would it be ok with Doctor Z if I just checked in with him about my progress and/or future questions even though Ill be in [facility] for awhile (insurance allowing of course which it does right now.)?    Thanks!  [name]

## 2020-07-30 NOTE — Progress Notes (Signed)
ORTHOPAEDIC SPINE SURGERY     Visit Type: Office Visit    Reason For Visit: Recheck    Surgery Date: 11/03/2019    Procedure: ROI and exploration of fusion L4-5       History Of Present Illness: 39 year old female returns to spine clinic for follow up evaluation of complaints of low back pain. She is seen again by video telehealth as a result of covid pandemic. She is now over one year s/p revision lumbar fusion with ROI. She is living in [place] with her parents and continues to have generalized and what she states remains incapacitating low back pain. Her pain has remained relatively unchanged despite multiple interventions. She has not engaged in any extensive PT regimen on account of low back pain. She currently is in discussion with pain management as well as a local spine surgeon about possible SCS vs revision lumbar fusion. She states she is not as interested in SCS because a psych evaluation is required.   Patient rates her pain as 7. Pain remains the same as initial visit. No relief with extensive interventions including PT, injections, and multiple surgeries.  Patient reports no changes in bowel and bladder function. Patient returns to for follow up.    Allergies:   Allergies   Allergen Reactions    Sulfa Drugs Nausea Only and Anxiety    Tramadol Nausea and Vomiting       Medications:   Current Outpatient Medications   Medication Sig    ALPRAZolam (XANAX) 0.25 MG tablet take 1 tablet by mouth once daily if needed    docusate sodium (COLACE) 250 MG capsule Take 1 capsule (250 mg) by mouth at bedtime.    gabapentin (NEURONTIN) 300 MG capsule Take 300 mg by mouth 3 times daily.    lidocaine (ASPERCREME) 4 % patch Apply 2 patches topically every 24 hours as needed (pain). Leave patch on for 12 hours, then remove for 12 hours.    oxyCODONE (ROXICODONE) 10 MG tablet Take 1 tablet (10 mg) by mouth every 3 hours as needed for Moderate Pain (Pain Score 4-6).    prochlorperazine (COMPAZINE) 5 MG tablet Take 1  tablet (5 mg) by mouth every 6 hours as needed for Nausea/Vomiting.    tiZANidine (ZANAFLEX) 4 MG tablet Take 1 tablet (4 mg) by mouth every 6 hours as needed (spasm).     No current facility-administered medications for this visit.         Review of Systems:  CONSTITUTIONAL: No unexpected weight loss, fevers, chills, or anorexia. SKIN: Noncontributory. GENITOURINARY: Symptoms as noted above in HPI. NEUROLOGIC: Symptoms as noted above in HPI. MUSCULOSKELETAL: Symptoms as noted above in HPI.    Physical Examination:  There were no vitals taken for this visit.  CONSTITUTIONAL: Well appearing and well groomed. PSYCHOLOGICAL: Alert and oriented and appropriate to situation. INTEGUMENT: Intact. VASCULAR: Radial and pedal pulses are intact and symmetric. EXTREMITIES: Bilateral upper and lower extremities have good range of motion and no significant deformities. GAIT: Brisk with good coordination.     CERVICAL SPINE: Nontender to palpation. Spurling's test negative. Sensation intact of the upper extremities.    RANGE OF MOTION (in degrees)       RT LT  Lateral motion   80  80  Lateral bend   45  45  Flexion 40 degrees. Extension 35 degrees. Motion in all planes is nonpainful.    MOTOR    RT LT  Trapezius   5/5  5/5  Deltoid    5/5  5/5  Bicep    5/5  5/5  Tricep    5/5  5/5  Wrist flexor   5/5  5/5  Wrist extensor  5/5  5/5  Intrinsics   5/5  5/5  Grip    5/5  5/5    DEEP TENDON REFLEXES       RT  LT  Bicep    2+  2+  Brachioradialis  2+  2+  Tricep    2+  2+    THORACIC SPINE  Nontender to percussion. Alignment - normal, no paravertebral muscle fullness. Sensation intact. Beevor's negative. Abdominal reflexes are absent and symmetric.     LUMBAR SPINE  Nontender to percussion. Heel to toe walk without deficit. Sensation intact of the lower extremities. Trendelenburg sign negative. Skin intact.    RANGE OF MOTION        RT  LT  lateral bend, normal at 25 degrees 25  25  rotation, normal at 30 degrees 30  30  Flexion -  normal at 40 degrees. Extension - normal at 10 degrees.    MOTOR STRENGTH       RT  LT  Iliopsoas   5/5  5/5  Quadricep   5/5  5/5  Anterior tibialis   5/5  5/5  Extensor hallucis longus 5/5  5/5  Gastrocsoleus  5/5  5/5    DEEP TENDON REFLEXES       RT  LT  Patellar   2+  2+  Achilles   2+  2+    PATHOLOGIC REFLEXES      RT LT  Clonus    Neg Neg  Babinski   Neg Neg  Hoffmans   Neg Neg    STRAIGHT LEG RAISING       RT  LT  Sitting @ 90 degrees   Neg Neg    IMAGING STUDIES: No new imaging.    OTHER MEDICAL RECORDS/IMAGING STUDIES REVIEWED: None    IMPRESSION: Low back pain. H/o multiple lumbar surgeries.     PLAN/DISCUSSION: Reviewed clinical history as well as exam results and imaging findings with patient in detail. Recommend ongoing discussion with her pain management and spine surgery providers who should be coordinating her care given her relocation. The plan is for observation. All questions were answered and Lelar Farewell understood and was satisfied with this plan.     FOLLOWUP: Return to clinic: prn. The patient is encouraged to call us with any questions or problems in the interim. Our contact numbers were given to the patient.      ---------------------(data below generated by Hyman Hopes, MD)--------------------     Patient Verification & Telemedicine Consent & Financial Waiver:    1.   Identity: I have verified this patient's identity to be accurate.  2.   Consent: I verify consent has been secured in one of the following methods: (a) obtained written/ online attestation consent (via MyChartVideoVisit pathway), (b) the spoke-side provider has obtained verbal or written consent from patient/surrogate (if this is a "provider to provider" evaluation), or (c) in all other cases, I have personally obtained verbal consent from the patient/ surrogate (noting all elements below) to perform this voluntary telemedicine evaluation (including obtaining history, performing examination and reviewing data provided  by the patient).   The patient/ surrogate has the right to refuse this evaluation.  I have explained risks (including potential loss of confidentiality), benefits, alternatives, and the potential need for subsequent face  to face care. Patient/ surrogate understands that there is a risk of medical inaccuracies given that our recommendations will be made based on reported data (and we must therefore assume this information is accurate).  Knowing that there is a risk that this information is not reported accurately, and that the telemedicine video, audio, or data feed may be incomplete, the patient agrees to proceed with evaluation and holds [place] harmless knowing these risks.  3.   Healthcare Team: The patient/ surrogate has been notified that other healthcare professionals (including students, residents and Metallurgist) may be involved in this audio-video evaluation.   All laws concerning confidentiality and patient access to medical records and copies of medical records apply to telemedicine.  4.   Privacy: If this is a Radiographer, therapeutic Visit, the patient/ surrogate has received the [place] Notice of Privacy Practices via E-Checkin process.  For all other video visit techniques, I have verbally provided the patient/ surrogate with the [place] in [place] (https://[email or web address]) or Spanish (https://[email or web address]://[email or web address]).  The patient/ surrogate acknowledges both being provided the NPP link, and has been offered to have the NPP mailed to the patient/ surrogate by [place] mail.  The patient/ surrogate has voiced understanding an acknowledgement of receipt of this NPP web address.  If the patient/surrogate has elected to receive the NPP via [place] mail, I verify that the NPP will be sent promptly to the patient/surrogate via [place] mail.  5.   Capacity: I have reviewed this above verification and consent paragraph with the patient/ surrogate and the patient is capacitated or has a  surrogate. If the patient is not capacitated to understand the above, and no surrogate is available, since this is not an emergency evaluation, the visit will be rescheduled until such time that the patient can consent, or the surrogate is available to consent. If this is an emergency evaluation and the patient is not capacitated to understand the above, and no surrogate is available, I am proceeding with this evaluation as this is felt to be an emergency setting and no appropriate specialist is available at the bedside to perform these evaluations.  6.   Financial Waiver: If this is a Radiographer, therapeutic Visit, the patient has been made aware of the financial waiver via E-Checkin process.  For all other video visit techniques, an E-Checkin process is not performed.  As such, I have personally verbally informed the patient/ surrogate that this evaluation will be a billable encounter similar to an in-person clinic visit, and the patient/ surrogate has agreed to pay the fee for services rendered.  If we are billing insurance for the patient's telehealth visit, her out-of-pocket cost will be determined based on her plan and will be billed to her.  The patient/ surrogate has also been informed that if the patient does not have insurance or does not wish to use insurance, [place] [employer] price for a primary care telehealth visit is $59.00 and specialist telehealth visit is $88.00.  I have further informed the patient/ surrogate that in the event the patient has additional services provided in conjunction with the specialty visit (Ex. Psychotherapy services), those services will be billed at the current rate less a 45% discount.  7.   Intra-State Location: The patient/ surrogate attests to understanding that if the patient accesses these services from a location outside of [place], that the patient does so at the patient's own risk and initiative and that the patient is ultimately responsible for compliance with  any  laws or regulations associated with the patient's use.  8.   Specific Use:The patient/ surrogate understands that [facility] makes no representation that materials or servicesdelivered via telecommunication services, or listed on telemedicine websites, are appropriate or available for use in any other location.           Demographics:  Medical Record #: [id number]  Date: July 15, 2020  Patient Name: Haley Barrett  DOB: 01/30/1981  Age: 39 year old  Sex: female  Location: Home address on file     Evaluator(s):  Porche Steinberger was evaluated by me today.    Clinic Location: [facility]   [facility]  [address]  [place] [address]

## 2020-08-07 ENCOUNTER — Telehealth (INDEPENDENT_AMBULATORY_CARE_PROVIDER_SITE_OTHER): Payer: BLUE CROSS/BLUE SHIELD | Admitting: Orthopaedic Surgery of the Spine

## 2020-10-09 ENCOUNTER — Telehealth (INDEPENDENT_AMBULATORY_CARE_PROVIDER_SITE_OTHER): Payer: BLUE CROSS/BLUE SHIELD | Admitting: Orthopaedic Surgery of the Spine

## 2020-11-12 ENCOUNTER — Telehealth (INDEPENDENT_AMBULATORY_CARE_PROVIDER_SITE_OTHER): Payer: Self-pay | Admitting: Orthopaedic Surgery of the Spine

## 2020-11-12 NOTE — Telephone Encounter (Signed)
Called pt due to provider being in surgery 11/13/20. Per Dr. Herma Carson message move appt further out and to schedule with [facility]. Lm on pt vm with new appt info. Will also send mc message.

## 2020-11-13 ENCOUNTER — Telehealth (INDEPENDENT_AMBULATORY_CARE_PROVIDER_SITE_OTHER): Payer: BLUE CROSS/BLUE SHIELD | Admitting: Orthopaedic Surgery of the Spine

## 2020-11-29 ENCOUNTER — Encounter (HOSPITAL_BASED_OUTPATIENT_CLINIC_OR_DEPARTMENT_OTHER): Payer: Self-pay | Admitting: Nurse Practitioner

## 2020-11-29 DIAGNOSIS — M549 Dorsalgia, unspecified: Secondary | ICD-10-CM

## 2020-12-03 ENCOUNTER — Encounter (HOSPITAL_BASED_OUTPATIENT_CLINIC_OR_DEPARTMENT_OTHER): Payer: Self-pay

## 2020-12-03 ENCOUNTER — Encounter (HOSPITAL_BASED_OUTPATIENT_CLINIC_OR_DEPARTMENT_OTHER): Payer: Self-pay | Admitting: Nurse Practitioner

## 2020-12-03 ENCOUNTER — Ambulatory Visit (HOSPITAL_BASED_OUTPATIENT_CLINIC_OR_DEPARTMENT_OTHER): Payer: BLUE CROSS/BLUE SHIELD | Admitting: Nurse Practitioner

## 2020-12-03 DIAGNOSIS — Z981 Arthrodesis status: Secondary | ICD-10-CM

## 2020-12-03 NOTE — Progress Notes (Signed)
ORTHOPAEDIC SPINE SURGERY   This visit was converted to a phone call due to technical difficulties.     Visit Type: Office Visit    Reason For Visit: Follow Up    Surgery Date: 11/03/2019    Procedure: ROI and exploration of fusion L4-5       History Of Present Illness: 40 year old female returns to spine clinic for follow up evaluation of complaints of low back pain. She is seen again by video telehealth as a result of covid pandemic. She is now over one year s/p revision lumbar fusion with ROI. She is living in [place] with her parents and continues to have generalized and what she states remains incapacitating low back pain. Her pain has remained relatively unchanged despite multiple interventions. She has not engaged in any extensive PT regimen on account of low back pain. She currently is in discussion with pain management as well as a local spine surgeon about possible SCS vs revision lumbar fusion. She states she is not as interested in SCS because a psych evaluation is required.   Patient rates her pain as  . Pain remains the same as initial visit. No relief with extensive interventions including PT, injections, and multiple surgeries.  Patient reports no changes in bowel and bladder function. Patient returns to for follow up.    Interval history: patient has back pain and has seen multiple surgeons in the [facility] area who proposed to revise fusion. She states another lumbar CT and bone scan was obtained. She has not been able to send the scans to [place] for review.     Allergies:   Allergies   Allergen Reactions    Sulfa Drugs Nausea Only and Anxiety    Tramadol Nausea and Vomiting       Medications:   Current Outpatient Medications   Medication Sig    ALPRAZolam (XANAX) 0.25 MG tablet take 1 tablet by mouth once daily if needed    docusate sodium (COLACE) 250 MG capsule Take 1 capsule (250 mg) by mouth at bedtime.    gabapentin (NEURONTIN) 300 MG capsule Take 300 mg by mouth 3 times daily.    lidocaine  (ASPERCREME) 4 % patch Apply 2 patches topically every 24 hours as needed (pain). Leave patch on for 12 hours, then remove for 12 hours.    oxyCODONE (ROXICODONE) 10 MG tablet Take 1 tablet (10 mg) by mouth every 3 hours as needed for Moderate Pain (Pain Score 4-6).    prochlorperazine (COMPAZINE) 5 MG tablet Take 1 tablet (5 mg) by mouth every 6 hours as needed for Nausea/Vomiting.     No current facility-administered medications for this visit.         Review of Systems:  CONSTITUTIONAL: No unexpected weight loss, fevers, chills, or anorexia. SKIN: Noncontributory. GENITOURINARY: Symptoms as noted above in HPI. NEUROLOGIC: Symptoms as noted above in HPI. MUSCULOSKELETAL: Symptoms as noted above in HPI.    Physical Examination:    None due to type of visit  IMAGING STUDIES: No new imaging.    OTHER MEDICAL RECORDS/IMAGING STUDIES REVIEWED: None    IMPRESSION: Low back pain. H/o multiple lumbar surgeries.     PLAN/DISCUSSION:will obtain Cds of her studies and send them to Dr. Herma Carson to review.  All questions were answered and Haley Barrett understood and was satisfied with this plan.     FOLLOWUP: Return to clinic: prn. The patient is encouraged to call us with any questions or problems in the interim. Our  contact numbers were given to the patient.    ---------------------(data below generated by Jossie Ng, NP)--------------------     Patient Verification & Telemedicine Consent & Financial Waiver:    1.   Identity: I have verified this patient's identity to be accurate.  2.   Consent: I verify consent has been secured in one of the following methods: (a) obtained written/ online attestation consent (via MyChartVideoVisit pathway), (b) the spoke-side provider has obtained verbal or written consent from patient/surrogate (if this is a "provider to provider" evaluation), or (c) in all other cases, I have personally obtained verbal consent from the patient/ surrogate (noting all elements below) to perform this  voluntary telemedicine evaluation (including obtaining history, performing examination and reviewing data provided by the patient).   The patient/ surrogate has the right to refuse this evaluation.  I have explained risks (including potential loss of confidentiality), benefits, alternatives, and the potential need for subsequent face to face care. Patient/ surrogate understands that there is a risk of medical inaccuracies given that our recommendations will be made based on reported data (and we must therefore assume this information is accurate).  Knowing that there is a risk that this information is not reported accurately, and that the telemedicine video, audio, or data feed may be incomplete, the patient agrees to proceed with evaluation and holds [place] harmless knowing these risks.  3.   Healthcare Team: The patient/ surrogate has been notified that other healthcare professionals (including students, residents and Engineer, maintenance) may be involved in this audio-video evaluation.   All laws concerning confidentiality and patient access to medical records and copies of medical records apply to telemedicine.  4.   Privacy: If this is a Restaurant manager, fast food Visit, the patient/ surrogate has received the [place] Notice of Privacy Practices via E-Checkin process.  For all other video visit techniques, I have verbally provided the patient/ surrogate with the [place] web link in [place] (https://health.dDotCom.si.aspx) or Spanish (https://health.LavishToys.ch.aspx).  The patient/ surrogate acknowledges both being provided the NPP link, and has been offered to have the NPP mailed to the patient/ surrogate by [place] mail.  The patient/ surrogate has voiced understanding an acknowledgement of receipt of this NPP web address.  If the patient/surrogate has elected to receive the NPP via [place] mail, I verify that the NPP will be sent promptly to the patient/surrogate via [place] mail.  5.   Capacity: I have reviewed  this above verification and consent paragraph with the patient/ surrogate and the patient is capacitated or has a surrogate. If the patient is not capacitated to understand the above, and no surrogate is available, since this is not an emergency evaluation, the visit will be rescheduled until such time that the patient can consent, or the surrogate is available to consent. If this is an emergency evaluation and the patient is not capacitated to understand the above, and no surrogate is available, I am proceeding with this evaluation as this is felt to be an emergency setting and no appropriate specialist is available at the bedside to perform these evaluations.  6.   Financial Waiver: If this is a Restaurant manager, fast food Visit, the patient has been made aware of the financial waiver via E-Checkin process.  For all other video visit techniques, an E-Checkin process is not performed.  As such, I have personally verbally informed the patient/ surrogate that this evaluation will be a billable encounter similar to an in-person clinic visit, and the patient/ surrogate has agreed to pay the fee  for services rendered.  If we are billing insurance for the patient's telehealth visit, her out-of-pocket cost will be determined based on her plan and will be billed to her.  The patient/ surrogate has also been informed that if the patient does not have insurance or does not wish to use insurance, [place] [address] [employer] price for a primary care telehealth visit is $59.00 and specialist telehealth visit is $88.00.  I have further informed the patient/ surrogate that in the event the patient has additional services provided in conjunction with the specialty visit (Ex. Psychotherapy services), those services will be billed at the current rate less a 45% discount.  7.   Intra-State Location: The patient/ surrogate attests to understanding that if the patient accesses these services from a location outside of [place], that the patient  does so at the patient's own risk and initiative and that the patient is ultimately responsible for compliance with any laws or regulations associated with the patient's use.  8.   Specific Use:The patient/ surrogate understands that [facility] makes no representation that materials or servicesdelivered via telecommunication services, or listed on telemedicine websites, are appropriate or available for use in any other location.           Demographics:  Medical Record #: [id number]  Date: December 03, 2020  Patient Name: Haley Barrett  DOB: 07-11-1981  Age: 40 year old  Sex: female  Location: Home address on file     Evaluator(s):  Haley Barrett was evaluated by me today.    Clinic Location: [facility]   [facility]  [address]  [place] [address]

## 2021-02-13 IMAGING — CT CT LUMBAR SPINE WITHOUT CONTRAST
3 of 4 series · 12 of 33 positions shown, 14 images · non-contrast
Comparison: Radiograph 01/09/2021. MR exam of 11/26/2020.

FINAL Diagnostic Imaging Report 
________________________________________________________________________________________________ 
CT LUMBAR SPINE WITHOUT CONTRAST, 02/13/2021 [DATE]: 
CLINICAL INDICATION: Low back pain. Prior fusion L4-L5. 
A search for DICOM formatted images was conducted for prior CT imaging studies 
completed at a non-affiliated media free facility.
TECHNIQUE: The lumbar spine was scanned from T12 through mid-sacrum without 
contrast on a high-resolution CT scanner using dose reduction techniques. 
Routing MPR reconstructions were performed.

[Series 2: axial · axial · 0.52mm/px · z∈[-381,-163]mm · 4 of 165 slices shown, 5 images]
[im 28/165  soft-tissue]
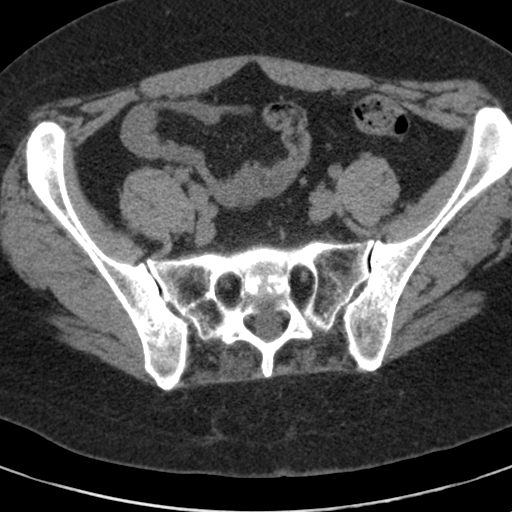
[im 28/165  bone]
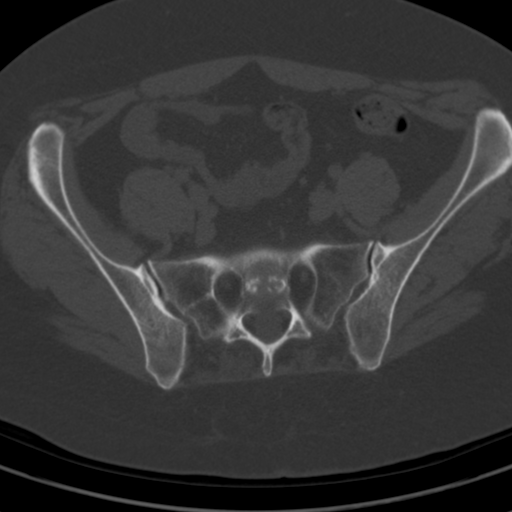
[im 55/165  bone]
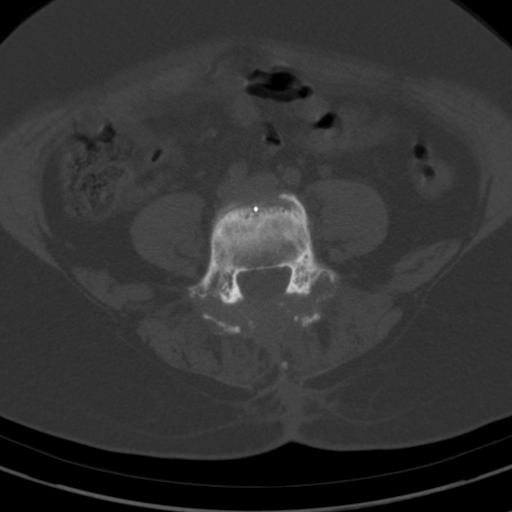
[im 110/165  bone]
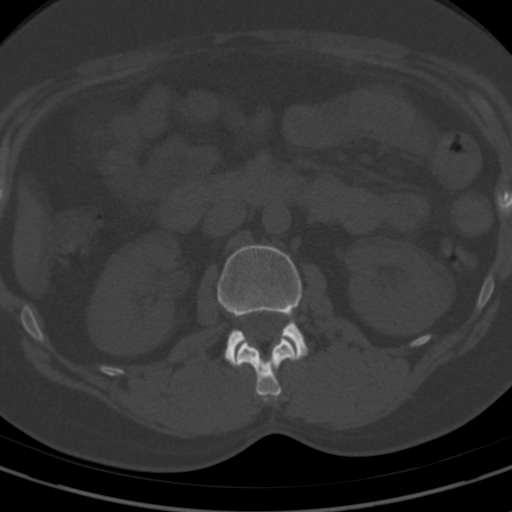
[im 137/165  bone]
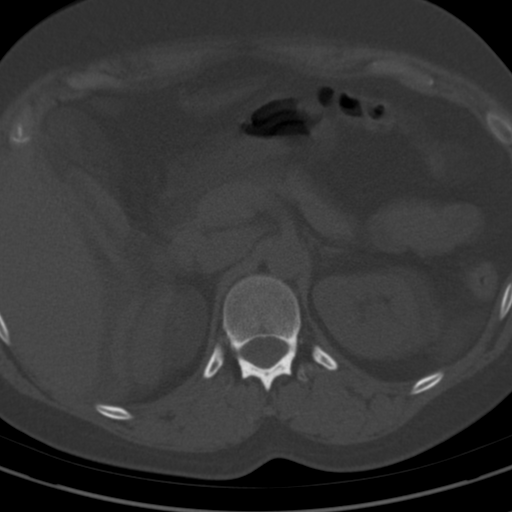

[Series 4: cor · coronal · 0.46mm/px · 3 of 62 slices shown]
[im 13/62  bone]
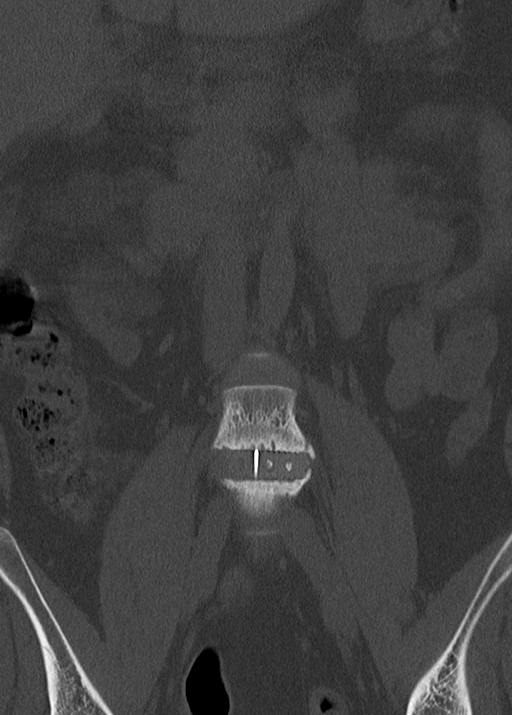
[im 25/62  bone]
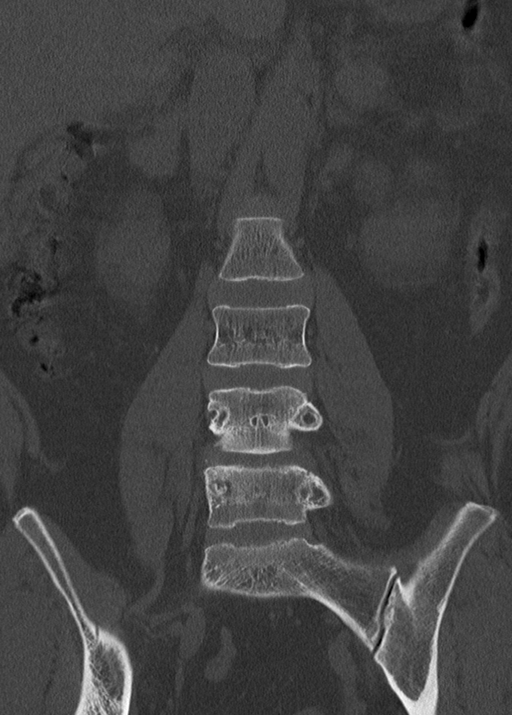
[im 37/62  bone]
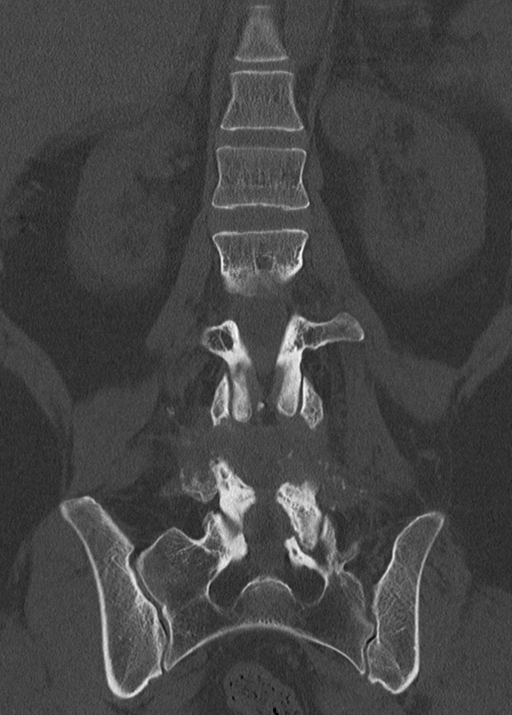

[Series 5: sag · sagittal · 0.45mm/px · 5 of 83 slices shown, 6 images]
[im 28/83  bone]
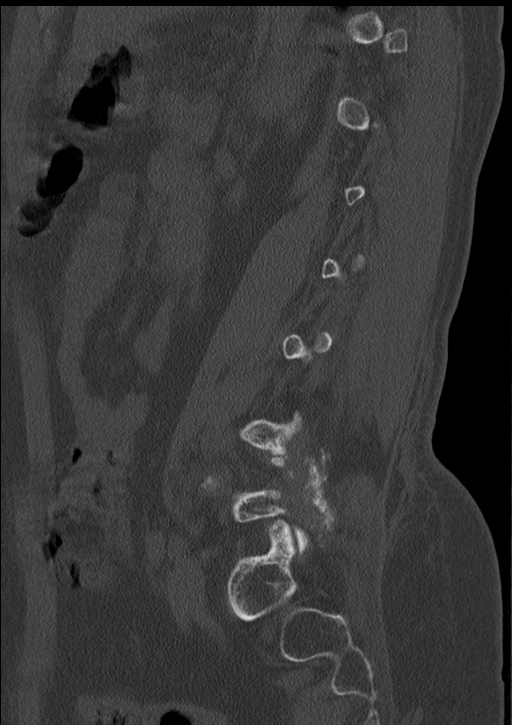
[im 35/83  bone]
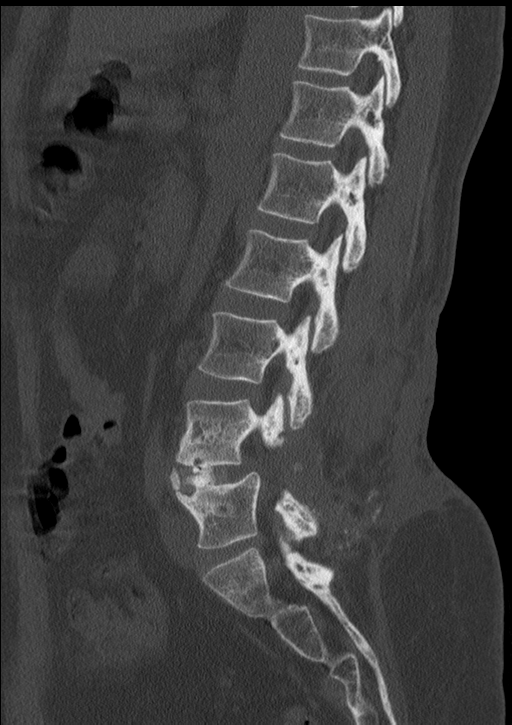
[im 42/83  soft-tissue]
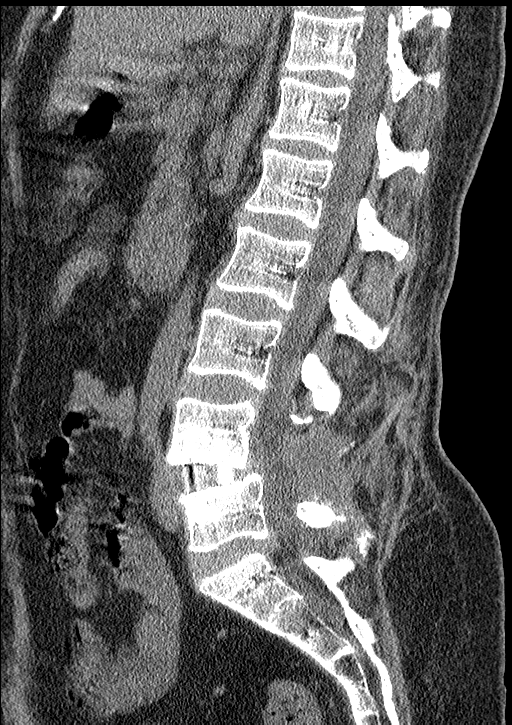
[im 42/83  bone]
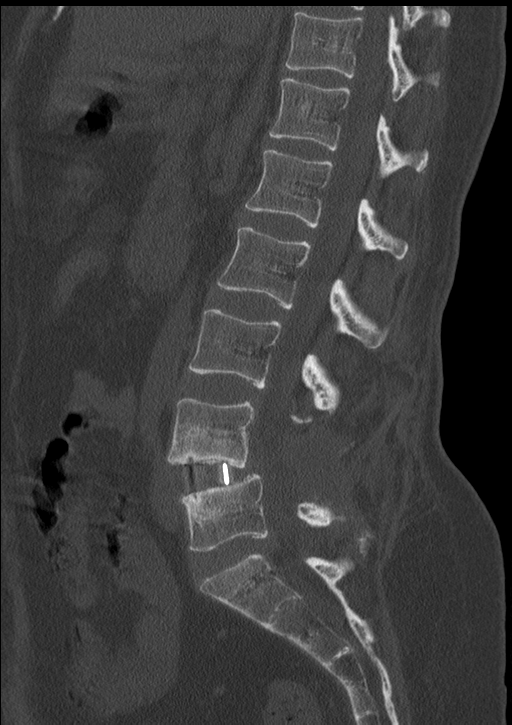
[im 48/83  bone]
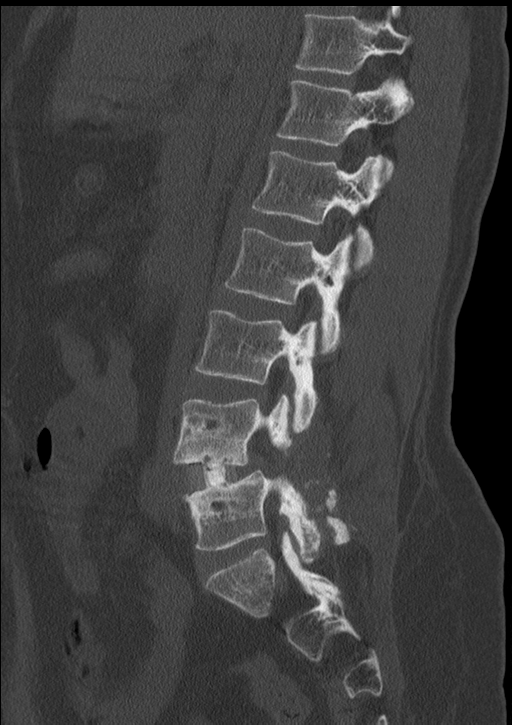
[im 55/83  bone]
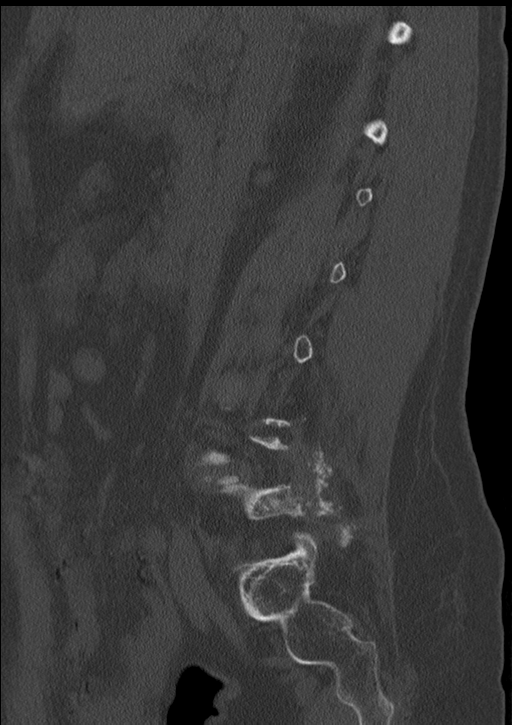

[12 of 33 positions shown; findings below may reference images not displayed]

FINDINGS: The vertebral bodies are normal in height. No vertebral body fracture. 
There are postsurgical changes including intervertebral body spacer L4-L5. 
Stable grade 1 anterolisthesis L4 on L5. No osteolytic or osteosclerotic lesion. 
Included portions of the SI joints are negative. The aorta is normal in caliber. 
Paraspinal soft tissues are negative. 
Disc levels are as follows given the intrinsic resolution of the CT exam: 
T12-L1: The disc is normal in height. No discrete disc herniation. Normal 
facets. No spinal canal or neural foraminal stenosis.  
L1-L2: The disc is normal in height. No discrete disc herniation. Normal facets. 
No spinal canal or neural foraminal stenosis.  
L2-L3: The disc is normal in height. No discrete disc herniation. Normal facets. 
No spinal canal or neural foraminal stenosis.  
L3-L4: The disc is normal in height. No discrete disc herniation. Normal facets. 
No spinal canal or neural foraminal stenosis.  
L4-L5: Postsurgical changes with wide hemilaminectomy defects and facetectomies. 
There is a small amount of fluid and dystrophic calcification about the surgical 
bed. No significant mass effect upon the dorsal aspect of the thecal sac. No 
spinal canal or neural foraminal stenosis. Intervertebral body body disc spacer. 
L5-S1: The disc is normal in height. No discrete disc herniation. Normal facets. 
No spinal canal or neural foraminal stenosis. Small amount of fluid and 
dystrophic calcification extends about the dorsal aspect of the posterior 
elements. 
Compared to the prior MR exam of 11/26/2020, there has been no interval change 
given the intrinsic differences between the 2 exams.
IMPRESSION: 1.  Postsurgical changes with posterior decompression and intervertebral body 
disc spacer at L4-L5 with stable anterolisthesis L4 on L5. 
2.  No spinal canal stenosis, neural foraminal stenosis or discrete neural 
impingement. 
RADIATION DOSE REDUCTION: All CT scans are performed using radiation dose 
reduction techniques, when applicable.  Technical factors are evaluated and 
adjusted to ensure appropriate moderation of exposure.  Automated dose 
management technology is applied to adjust the radiation doses to minimize 
exposure while achieving diagnostic quality images.

## 2021-04-04 ENCOUNTER — Telehealth (INDEPENDENT_AMBULATORY_CARE_PROVIDER_SITE_OTHER): Payer: Self-pay | Admitting: Orthopaedic Surgery of the Spine

## 2021-04-04 NOTE — Telephone Encounter (Signed)
Called pt due to provider being on-call 8/10. Lm on pt vm with new appt info. Sent mc message.

## 2021-04-10 ENCOUNTER — Other Ambulatory Visit: Payer: Self-pay

## 2021-04-16 ENCOUNTER — Telehealth (INDEPENDENT_AMBULATORY_CARE_PROVIDER_SITE_OTHER): Payer: BLUE CROSS/BLUE SHIELD | Admitting: Orthopaedic Surgery of the Spine

## 2021-04-30 ENCOUNTER — Encounter (INDEPENDENT_AMBULATORY_CARE_PROVIDER_SITE_OTHER): Payer: Self-pay | Admitting: Orthopaedic Surgery of the Spine

## 2021-04-30 ENCOUNTER — Telehealth (INDEPENDENT_AMBULATORY_CARE_PROVIDER_SITE_OTHER): Payer: BLUE CROSS/BLUE SHIELD | Admitting: Orthopaedic Surgery of the Spine

## 2021-04-30 ENCOUNTER — Telehealth (INDEPENDENT_AMBULATORY_CARE_PROVIDER_SITE_OTHER): Payer: Self-pay | Admitting: Orthopaedic Surgery of the Spine

## 2021-04-30 NOTE — Telephone Encounter (Signed)
Patient calling back she is aware of MYchart not able to be complete, she is now living in [place] an will be meeting with another surgeon tomorrow to re-do her spinal fusion she is asking if Dr Herma Carson is able to call her has some questions her number [phone].

## 2021-04-30 NOTE — Telephone Encounter (Signed)
Called pt regarding questions. Advised to send a [employer]. She states she has done that in the past and "never gets her questions answered". I explained to patient to send the message and she can call back and relay a message via triage if needed. Pt agrees and will send

## 2021-04-30 NOTE — Telephone Encounter (Signed)
Called and LVM for pt, she cannot do a MCVV from out of state. Pt needs to be in [place] or come in for appt. Please RS pt unless she is stateside

## 2021-05-02 NOTE — Telephone Encounter (Signed)
From: [name]  To: [name], MD  Sent: 04/30/2021 3:36 PM PDT  Subject: Question about my last fusion    Hi,    I was told by Joretta Bachelor I could ask Doc Z through this platform about my last fusion. I have had to move to [facility] and she said I cannot do the mychart because of that. The surgeon I am establishing care with wants to re-do my L4 L5 fusion that Doc Z did based on the bone scans. (i sent these through the system as well if he wants to look at them.) I am signing up for another surgery, but wanted to ask him his opinion on when he last the fusion if it actually looked fused as I think was his opinion then and if there was anything he could tell me about L3-L4 as well that would be helpful for this surgeon to know. I believe it's giving me problems now, but the surgeon cannot see it and I know doctor Z said he could tell it was more unstable than it looked during surgery. Thank you. I'm just trying to give the new surgeon as much info as possible to make the right decision.

## 2021-05-02 NOTE — Telephone Encounter (Signed)
Called patient to schedule MCVV with Dr. Herma Carson - No answer- If patient calls back please assist with scheduling.

## 2021-05-02 NOTE — Telephone Encounter (Signed)
Ok to schedule mcvv Dr Herma Carson f/a

## 2021-05-06 NOTE — Telephone Encounter (Signed)
Patient may schedule a mcvv with Dr.Zlomislic this one time per Maddy.                   Ok to schedule mcvv Dr Herma Carson f/a         Please assist in scheduling this when she calls.

## 2021-05-07 NOTE — Telephone Encounter (Signed)
Scheduled 05-21-21 at 340pm mcvv, pt aware, ok per [facility]

## 2021-05-21 ENCOUNTER — Telehealth (INDEPENDENT_AMBULATORY_CARE_PROVIDER_SITE_OTHER): Payer: Self-pay | Admitting: Orthopaedic Surgery of the Spine

## 2021-05-21 ENCOUNTER — Telehealth (INDEPENDENT_AMBULATORY_CARE_PROVIDER_SITE_OTHER): Payer: BLUE CROSS/BLUE SHIELD | Admitting: Orthopaedic Surgery of the Spine

## 2021-05-21 ENCOUNTER — Encounter (INDEPENDENT_AMBULATORY_CARE_PROVIDER_SITE_OTHER): Payer: Self-pay

## 2021-05-21 NOTE — Telephone Encounter (Signed)
Spoke with patient she was aware provider runs behind and will remain logged in.

## 2021-05-22 ENCOUNTER — Telehealth (INDEPENDENT_AMBULATORY_CARE_PROVIDER_SITE_OTHER): Payer: Self-pay | Admitting: Orthopaedic Surgery of the Spine

## 2021-05-22 NOTE — Telephone Encounter (Signed)
Contacted patient and rescheduled her for a video visit on 06/18/21 at 1540. Provider was unable to see patient on 05/21/2021.

## 2021-06-18 ENCOUNTER — Telehealth (INDEPENDENT_AMBULATORY_CARE_PROVIDER_SITE_OTHER): Payer: Self-pay | Admitting: Orthopaedic Surgery of the Spine

## 2021-06-18 ENCOUNTER — Telehealth (INDEPENDENT_AMBULATORY_CARE_PROVIDER_SITE_OTHER): Payer: BLUE CROSS/BLUE SHIELD | Admitting: Orthopaedic Surgery of the Spine

## 2021-06-18 DIAGNOSIS — M549 Dorsalgia, unspecified: Secondary | ICD-10-CM

## 2021-06-18 DIAGNOSIS — Z981 Arthrodesis status: Secondary | ICD-10-CM

## 2021-06-18 NOTE — Telephone Encounter (Signed)
Patient is aware provider is running behind and will send her a doximity text when he ready to start visit.  Patient was fine with waiting.

## 2021-06-18 NOTE — Progress Notes (Signed)
ORTHOPAEDIC SPINE SURGERY     Visit Type: Office Visit    Reason For Visit: No chief complaint on file.    Surgery Date: 11/03/2019    Procedure: ROI and exploration of fusion L4-5       History Of Present Illness: 40 year old female returns to spine clinic for follow up evaluation of complaints of low back pain. She is seen again by video telehealth as a result of covid pandemic. She is now over one year s/p revision lumbar fusion with ROI. She states that she is still in [place] with her parents and continues to have ongoing progression of low back pain which is severely activity limiting. She has had multiple surgical opinions there and is considering undertaking addition surgery for her low back pain.  Patient rates her pain as  . No relief with extensive interventions including PT, injections, and multiple surgeries.  Patient reports no changes in bowel and bladder function. Patient returns to for follow up.    Allergies:   Allergies   Allergen Reactions    Sulfa Drugs Nausea Only and Anxiety    Tramadol Nausea and Vomiting       Medications:   Current Outpatient Medications   Medication Sig    ALPRAZolam (XANAX) 0.25 MG tablet take 1 tablet by mouth once daily if needed    docusate sodium (COLACE) 250 MG capsule Take 1 capsule (250 mg) by mouth at bedtime.    gabapentin (NEURONTIN) 300 MG capsule Take 300 mg by mouth 3 times daily.    lidocaine (ASPERCREME) 4 % patch Apply 2 patches topically every 24 hours as needed (pain). Leave patch on for 12 hours, then remove for 12 hours.    oxyCODONE (ROXICODONE) 10 MG tablet Take 1 tablet (10 mg) by mouth every 3 hours as needed for Moderate Pain (Pain Score 4-6).    prochlorperazine (COMPAZINE) 5 MG tablet Take 1 tablet (5 mg) by mouth every 6 hours as needed for Nausea/Vomiting.     No current facility-administered medications for this visit.         Review of Systems:  CONSTITUTIONAL: No unexpected weight loss, fevers, chills, or anorexia. SKIN:  Noncontributory. GENITOURINARY: Symptoms as noted above in HPI. NEUROLOGIC: Symptoms as noted above in HPI. MUSCULOSKELETAL: Symptoms as noted above in HPI.    Physical Examination:  There were no vitals taken for this visit.  CONSTITUTIONAL: Well appearing and well groomed. PSYCHOLOGICAL: Alert and oriented and appropriate to situation. INTEGUMENT: Intact. VASCULAR: Radial and pedal pulses are intact and symmetric. EXTREMITIES: Bilateral upper and lower extremities have good range of motion and no significant deformities. GAIT: Brisk with good coordination.     CERVICAL SPINE: Nontender to palpation. Spurling's test negative. Sensation intact of the upper extremities.    RANGE OF MOTION (in degrees)       RT LT  Lateral motion   80  80  Lateral bend   45  45  Flexion 40 degrees. Extension 35 degrees. Motion in all planes is nonpainful.    MOTOR    RT LT  Trapezius   5/5  5/5  Deltoid    5/5  5/5  Bicep    5/5  5/5  Tricep    5/5  5/5  Wrist flexor   5/5  5/5  Wrist extensor  5/5  5/5  Intrinsics   5/5  5/5  Grip    5/5  5/5    DEEP TENDON REFLEXES  RT  LT  Bicep    2+  2+  Brachioradialis  2+  2+  Tricep    2+  2+    THORACIC SPINE  Nontender to percussion. Alignment - normal, no paravertebral muscle fullness. Sensation intact. Beevor's negative. Abdominal reflexes are absent and symmetric.     LUMBAR SPINE  Nontender to percussion. Heel to toe walk without deficit. Sensation intact of the lower extremities. Trendelenburg sign negative. Skin intact.    RANGE OF MOTION        RT  LT  lateral bend, normal at 25 degrees 25  25  rotation, normal at 30 degrees 30  30  Flexion - normal at 40 degrees. Extension - normal at 10 degrees.    MOTOR STRENGTH       RT  LT  Iliopsoas   5/5  5/5  Quadricep   5/5  5/5  Anterior tibialis   5/5  5/5  Extensor hallucis longus 5/5  5/5  Gastrocsoleus  5/5  5/5    DEEP TENDON REFLEXES       RT  LT  Patellar   2+  2+  Achilles   2+  2+    PATHOLOGIC  REFLEXES      RT LT  Clonus    Neg Neg  Babinski   Neg Neg  Hoffmans   Neg Neg    STRAIGHT LEG RAISING       RT  LT  Sitting @ 90 degrees   Neg Neg    IMAGING STUDIES: No new imaging.    OTHER MEDICAL RECORDS/IMAGING STUDIES REVIEWED: None    IMPRESSION: Low back pain. H/o multiple lumbar surgeries.     PLAN/DISCUSSION: Reviewed clinical history as well as exam results and imaging findings with patient in detail. Recommend ongoing discussion with her pain management and spine surgery providers who should be coordinating her care given her relocation. She has had discussions for revision surgery which would involve the L4-5 and L5-S1 levels which do appear reasonable given the progression of her pain and degenerative changes at L5-S1. She will follow up with her outside surgeons.. All questions were answered and Haley Barrett understood and was satisfied with this plan.     FOLLOWUP: Return to clinic: prn. The patient is encouraged to call us with any questions or problems in the interim. Our contact numbers were given to the patient.      ---------------------(data below generated by Hyman Hopes, MD)--------------------     Patient Verification & Telemedicine Consent & Financial Waiver:    1.   Identity: I have verified this patient's identity to be accurate.  2.   Consent: I verify consent has been secured in one of the following methods: (a) obtained written/ online attestation consent (via MyChartVideoVisit pathway), (b) the spoke-side provider has obtained verbal or written consent from patient/surrogate (if this is a "provider to provider" evaluation), or (c) in all other cases, I have personally obtained verbal consent from the patient/ surrogate (noting all elements below) to perform this voluntary telemedicine evaluation (including obtaining history, performing examination and reviewing data provided by the patient).   The patient/ surrogate has the right to refuse this evaluation.  I have explained risks  (including potential loss of confidentiality), benefits, alternatives, and the potential need for subsequent face to face care. Patient/ surrogate understands that there is a risk of medical inaccuracies given that our recommendations will be made based on reported data (and we must therefore assume this information  is accurate).  Knowing that there is a risk that this information is not reported accurately, and that the telemedicine video, audio, or data feed may be incomplete, the patient agrees to proceed with evaluation and holds [place] harmless knowing these risks.  3.   Healthcare Team: The patient/ surrogate has been notified that other healthcare professionals (including students, residents and Engineer, maintenance) may be involved in this audio-video evaluation.   All laws concerning confidentiality and patient access to medical records and copies of medical records apply to telemedicine.  4.   Privacy: If this is a Restaurant manager, fast food Visit, the patient/ surrogate has received the [place] Notice of Privacy Practices via E-Checkin process.  For all other video visit techniques, I have verbally provided the patient/ surrogate with the [place] web link in [place] (https://health.dDotCom.si.aspx) or Spanish (https://health.LavishToys.ch.aspx).  The patient/ surrogate acknowledges both being provided the NPP link, and has been offered to have the NPP mailed to the patient/ surrogate by [place] mail.  The patient/ surrogate has voiced understanding an acknowledgement of receipt of this NPP web address.  If the patient/surrogate has elected to receive the NPP via [place] mail, I verify that the NPP will be sent promptly to the patient/surrogate via [place] mail.  5.   Capacity: I have reviewed this above verification and consent paragraph with the patient/ surrogate and the patient is capacitated or has a surrogate. If the patient is not capacitated to understand the above, and no surrogate is available, since  this is not an emergency evaluation, the visit will be rescheduled until such time that the patient can consent, or the surrogate is available to consent. If this is an emergency evaluation and the patient is not capacitated to understand the above, and no surrogate is available, I am proceeding with this evaluation as this is felt to be an emergency setting and no appropriate specialist is available at the bedside to perform these evaluations.  6.   Financial Waiver: If this is a Restaurant manager, fast food Visit, the patient has been made aware of the financial waiver via E-Checkin process.  For all other video visit techniques, an E-Checkin process is not performed.  As such, I have personally verbally informed the patient/ surrogate that this evaluation will be a billable encounter similar to an in-person clinic visit, and the patient/ surrogate has agreed to pay the fee for services rendered.  If we are billing insurance for the patient's telehealth visit, her out-of-pocket cost will be determined based on her plan and will be billed to her.  The patient/ surrogate has also been informed that if the patient does not have insurance or does not wish to use insurance, [place] [address] [employer] price for a primary care telehealth visit is $59.00 and specialist telehealth visit is $88.00.  I have further informed the patient/ surrogate that in the event the patient has additional services provided in conjunction with the specialty visit (Ex. Psychotherapy services), those services will be billed at the current rate less a 45% discount.  7.   Intra-State Location: The patient/ surrogate attests to understanding that if the patient accesses these services from a location outside of [place], that the patient does so at the patient's own risk and initiative and that the patient is ultimately responsible for compliance with any laws or regulations associated with the patient's use.  8.   Specific Use:The patient/ surrogate  understands that [place] makes no representation that materials or servicesdelivered via telecommunication services, or listed on  telemedicine websites, are appropriate or available for use in any other location.           Demographics:  Medical Record #: [id number]  Date: June 18, 2021  Patient Name: Haley Barrett  DOB: 1980/11/01  Age: 40 year old  Sex: female  Location: Home address on file     Evaluator(s):  Haley Barrett was evaluated by me today.    Clinic Location: [facility]   [facility]  [address]  [place] [address]

## 2021-11-03 ENCOUNTER — Ambulatory Visit (HOSPITAL_BASED_OUTPATIENT_CLINIC_OR_DEPARTMENT_OTHER): Payer: BLUE CROSS/BLUE SHIELD | Admitting: Orthopaedic Surgery of the Spine

## 2021-11-26 ENCOUNTER — Telehealth (INDEPENDENT_AMBULATORY_CARE_PROVIDER_SITE_OTHER): Payer: Self-pay | Admitting: Orthopaedic Surgery of the Spine

## 2021-11-26 ENCOUNTER — Telehealth (INDEPENDENT_AMBULATORY_CARE_PROVIDER_SITE_OTHER): Payer: BLUE CROSS/BLUE SHIELD | Admitting: Orthopaedic Surgery of the Spine

## 2021-11-26 DIAGNOSIS — M549 Dorsalgia, unspecified: Secondary | ICD-10-CM

## 2021-11-26 NOTE — Telephone Encounter (Signed)
Spoke w/patient letting her know provider is can call her in approx 1 hr.  She said that is fine she has a couple questions.

## 2021-12-16 NOTE — Progress Notes (Signed)
ORTHOPAEDIC SPINE SURGERY     Visit Type: Office Visit    Reason For Visit: No chief complaint on file.    Surgery Date: 11/03/2019    Procedure: ROI and exploration of fusion L4-5       History Of Present Illness: 41 year old female returns to spine clinic for follow up evaluation of complaints of low back pain. She is seen again by video telehealth as a result of covid pandemic. She states that she recebntly underwent revision spinal fusion at OSH in [place]. She states that she continues to have severe low back pain and is intermittently tearful during todays visit again. . She has ongoing progression of low back pain which is severely activity limiting. She has tried PT but is having difficulty with pain although has not completed formal postop PT regimen.  Patient reports no changes in bowel and bladder function. Patient returns to for follow up.    Allergies:   Allergies   Allergen Reactions   . Sulfa Drugs Nausea Only and Anxiety   . Tramadol Nausea and Vomiting       Medications:   Current Outpatient Medications   Medication Sig   . ALPRAZolam (XANAX) 0.25 MG tablet take 1 tablet by mouth once daily if needed   . docusate sodium (COLACE) 250 MG capsule Take 1 capsule (250 mg) by mouth at bedtime.   . gabapentin (NEURONTIN) 300 MG capsule Take 300 mg by mouth 3 times daily.   Marland Kitchen lidocaine (ASPERCREME) 4 % patch Apply 2 patches topically every 24 hours as needed (pain). Leave patch on for 12 hours, then remove for 12 hours.   Marland Kitchen oxyCODONE (ROXICODONE) 10 MG tablet Take 1 tablet (10 mg) by mouth every 3 hours as needed for Moderate Pain (Pain Score 4-6).   Marland Kitchen prochlorperazine (COMPAZINE) 5 MG tablet Take 1 tablet (5 mg) by mouth every 6 hours as needed for Nausea/Vomiting.     No current facility-administered medications for this visit.         Review of Systems:  CONSTITUTIONAL: No unexpected weight loss, fevers, chills, or anorexia. SKIN: Noncontributory. GENITOURINARY: Symptoms as noted above in HPI. NEUROLOGIC:  Symptoms as noted above in HPI. MUSCULOSKELETAL: Symptoms as noted above in HPI.    Physical Examination:  There were no vitals taken for this visit.  CONSTITUTIONAL: Well appearing and well groomed. PSYCHOLOGICAL: Alert and oriented and appropriate to situation. INTEGUMENT: Intact. VASCULAR: Radial and pedal pulses are intact and symmetric. EXTREMITIES: Bilateral upper and lower extremities have good range of motion and no significant deformities. GAIT: Brisk with good coordination.     CERVICAL SPINE: Nontender to palpation. Spurling's test negative. Sensation intact of the upper extremities.    RANGE OF MOTION (in degrees)       RT LT  Lateral motion   80  80  Lateral bend   45  45  Flexion 40 degrees. Extension 35 degrees. Motion in all planes is nonpainful.    MOTOR    RT LT  Trapezius   5/5  5/5  Deltoid    5/5  5/5  Bicep    5/5  5/5  Tricep    5/5  5/5  Wrist flexor   5/5  5/5  Wrist extensor  5/5  5/5  Intrinsics   5/5  5/5  Grip    5/5  5/5    DEEP TENDON REFLEXES       RT  LT  Bicep    2+  2+  Brachioradialis  2+  2+  Tricep    2+  2+    THORACIC SPINE  Nontender to percussion. Alignment - normal, no paravertebral muscle fullness. Sensation intact. Beevor's negative. Abdominal reflexes are absent and symmetric.     LUMBAR SPINE  Nontender to percussion. Heel to toe walk without deficit. Sensation intact of the lower extremities. Trendelenburg sign negative. Skin intact.    RANGE OF MOTION        RT  LT  lateral bend, normal at 25 degrees 25  25  rotation, normal at 30 degrees 30  30  Flexion - normal at 40 degrees. Extension - normal at 10 degrees.    MOTOR STRENGTH       RT  LT  Iliopsoas   5/5  5/5  Quadricep   5/5  5/5  Anterior tibialis   5/5  5/5  Extensor hallucis longus 5/5  5/5  Gastrocsoleus  5/5  5/5    DEEP TENDON REFLEXES       RT  LT  Patellar   2+  2+  Achilles   2+  2+    PATHOLOGIC REFLEXES      RT LT  Clonus    Neg Neg  Babinski   Neg Neg  Hoffmans   Neg Neg    STRAIGHT LEG RAISING        RT  LT  Sitting @ 90 degrees   Neg Neg    IMAGING STUDIES: No new imaging.    OTHER MEDICAL RECORDS/IMAGING STUDIES REVIEWED: None    IMPRESSION: Low back pain. s/p multiple lumbar surgeries.     PLAN/DISCUSSION: Reviewed clinical history as well as exam results and imaging findings with patient in detail. She recently underwent revision lumbar fusion procedure at OSH in [place], although the nature of her surgery is not entirely clear given lack of outside reports and imaging. We have deferred to her current surgeon for postop regimen and provided reassurance regarding postop recovery and PT plan.  All questions were answered and Haley Barrett understood and was satisfied with this plan.     FOLLOWUP: Return to clinic: prn. The patient is encouraged to call us with any questions or problems in the interim. Our contact numbers were given to the patient.      ---------------------(data below generated by Jennye Moccasin, MD)--------------------     Patient Verification & Telemedicine Consent & Financial Waiver:    1.   Identity: I have verified this patient's identity to be accurate.  2.   Consent: I verify consent has been secured in one of the following methods: (a) obtained written/ online attestation consent (via MyChartVideoVisit pathway), (b) the spoke-side provider has obtained verbal or written consent from patient/surrogate (if this is a "provider to provider" evaluation), or (c) in all other cases, I have personally obtained verbal consent from the patient/ surrogate (noting all elements below) to perform this voluntary telemedicine evaluation (including obtaining history, performing examination and reviewing data provided by the patient).   The patient/ surrogate has the right to refuse this evaluation.  I have explained risks (including potential loss of confidentiality), benefits, alternatives, and the potential need for subsequent face to face care. Patient/ surrogate understands that there is a  risk of medical inaccuracies given that our recommendations will be made based on reported data (and we must therefore assume this information is accurate).  Knowing that there is a risk that this information is not reported accurately, and that the telemedicine  video, audio, or data feed may be incomplete, the patient agrees to proceed with evaluation and holds [place] harmless knowing these risks.  3.   Healthcare Team: The patient/ surrogate has been notified that other healthcare professionals (including students, residents and Metallurgist) may be involved in this audio-video evaluation.   All laws concerning confidentiality and patient access to medical records and copies of medical records apply to telemedicine.  4.   Privacy: If this is a Radiographer, therapeutic Visit, the patient/ surrogate has received the [place] Notice of Privacy Practices via E-Checkin process.  For all other video visit techniques, I have verbally provided the patient/ surrogate with the [place] in [place] (https://[email or web address].se.aspx) or Spanish (https://[email or web address]://www.matthews.info/.aspx).  The patient/ surrogate acknowledges both being provided the NPP link, and has been offered to have the NPP mailed to the patient/ surrogate by [place] mail.  The patient/ surrogate has voiced understanding an acknowledgement of receipt of this NPP web address.  If the patient/surrogate has elected to receive the NPP via [place] mail, I verify that the NPP will be sent promptly to the patient/surrogate via [place] mail.  5.   Capacity: I have reviewed this above verification and consent paragraph with the patient/ surrogate and the patient is capacitated or has a surrogate. If the patient is not capacitated to understand the above, and no surrogate is available, since this is not an emergency evaluation, the visit will be rescheduled until such time that the patient can consent, or the surrogate is available to consent. If this is an  emergency evaluation and the patient is not capacitated to understand the above, and no surrogate is available, I am proceeding with this evaluation as this is felt to be an emergency setting and no appropriate specialist is available at the bedside to perform these evaluations.  6.   Financial Waiver: If this is a Radiographer, therapeutic Visit, the patient has been made aware of the financial waiver via E-Checkin process.  For all other video visit techniques, an E-Checkin process is not performed.  As such, I have personally verbally informed the patient/ surrogate that this evaluation will be a billable encounter similar to an in-person clinic visit, and the patient/ surrogate has agreed to pay the fee for services rendered.  If we are billing insurance for the patient's telehealth visit, her out-of-pocket cost will be determined based on her plan and will be billed to her.  The patient/ surrogate has also been informed that if the patient does not have insurance or does not wish to use insurance, [place] [employer] price for a primary care telehealth visit is $59.00 and specialist telehealth visit is $88.00.  I have further informed the patient/ surrogate that in the event the patient has additional services provided in conjunction with the specialty visit (Ex. Psychotherapy services), those services will be billed at the current rate less a 45% discount.  7.   Intra-State Location: The patient/ surrogate attests to understanding that if the patient accesses these services from a location outside of [place], that the patient does so at the patient's own risk and initiative and that the patient is ultimately responsible for compliance with any laws or regulations associated with the patient's use.  8.   Specific Use:The patient/ surrogate understands that [place] makes no representation that materials or servicesdelivered via telecommunication services, or listed on telemedicine websites, are appropriate or  available for use in any other location.  Demographics:  Medical Record #: [id number]  Date: November 27, 2021  Patient Name: Haley Barrett  DOB: 10-07-1980  Age: 41 year old  Sex: female  Location: Home address on file     Evaluator(s):  Haley Barrett was evaluated by me today.    Clinic Location: [facility]   [facility]  [address]  [place] [address]

## 2021-12-23 IMAGING — CT CT LUMBAR SPINE WITHOUT CONTRAST
3 of 5 series · 11 of 33 positions shown, 13 images · non-contrast
Comparison: CT lumbar spine from February 13, 2021.

________________________________________________________________________________________________ 
CT LUMBAR SPINE WITHOUT CONTRAST, 12/23/2021 [DATE]: 
CLINICAL INDICATION: Recent surgery. Recent fall with increased pain. 
A search for DICOM formatted images was conducted for prior CT imaging studies 
completed at a non-affiliated media free facility.
TECHNIQUE: The lumbar spine was scanned from T12 through mid-sacrum without 
contrast on a high-resolution CT scanner using dose reduction techniques. 
Routing MPR reconstructions were performed.

[Series 5: cor · coronal · 0.42mm/px · 3 of 88 slices shown]
[im 18/88  bone]
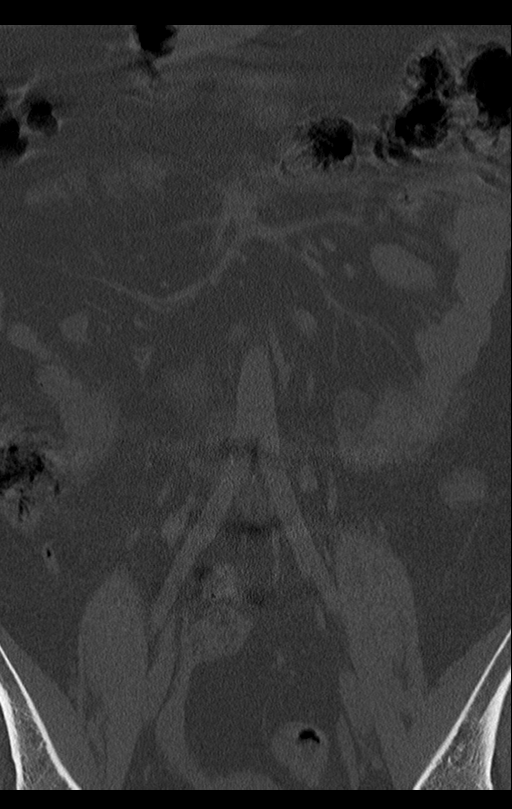
[im 35/88  bone]
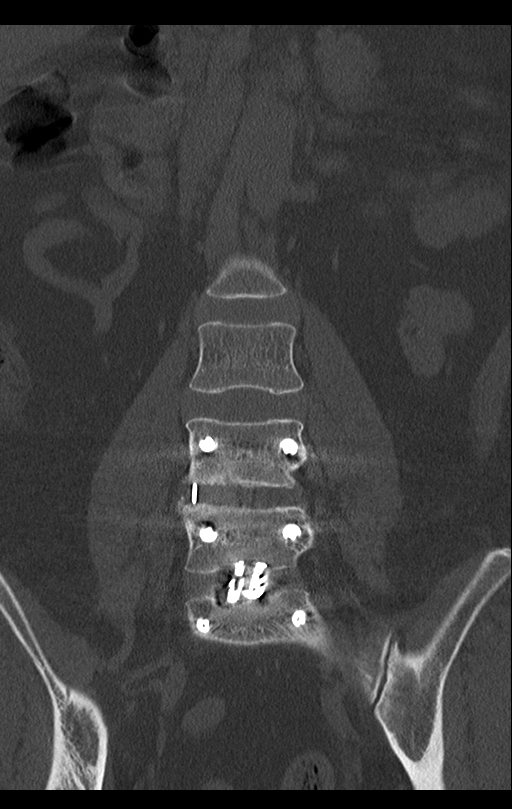
[im 53/88  bone]
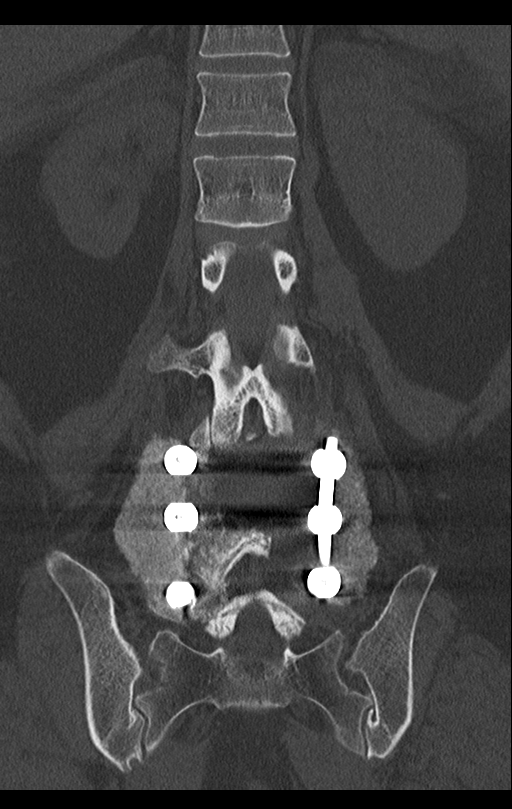

[Series 6: sag · sagittal · 0.45mm/px · 5 of 72 slices shown, 6 images]
[im 24/72  bone]
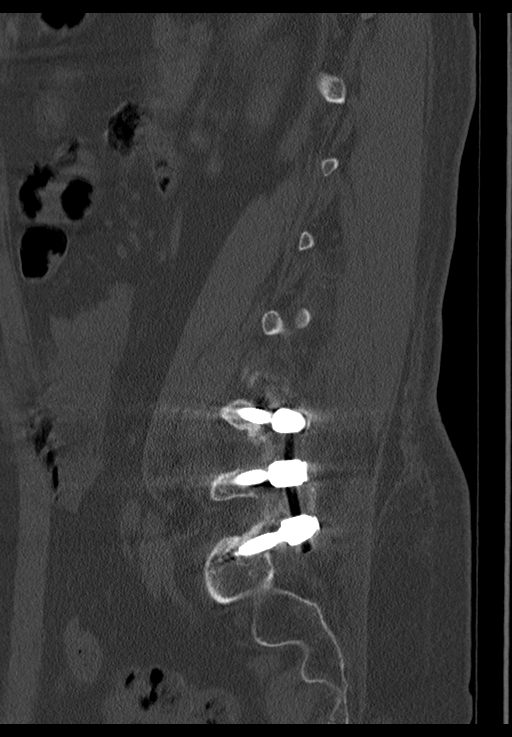
[im 30/72  bone]
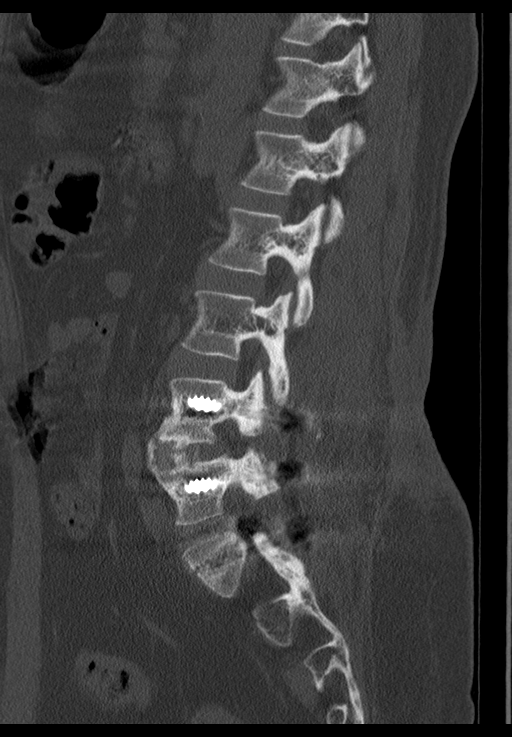
[im 36/72  soft-tissue]
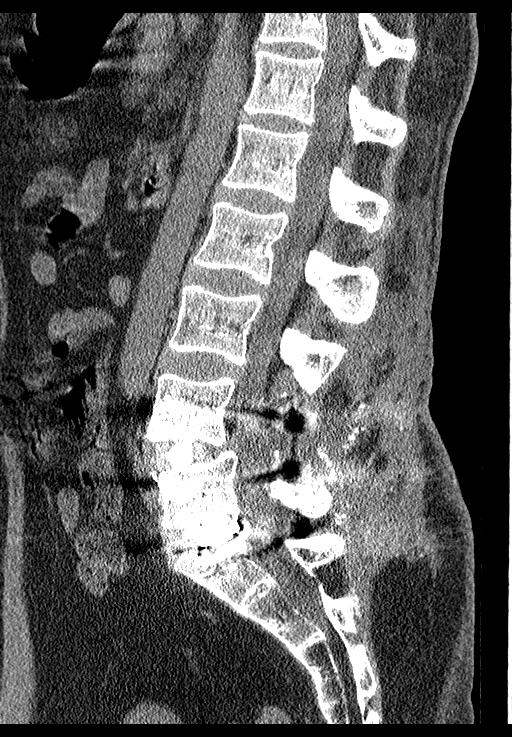
[im 36/72  bone]
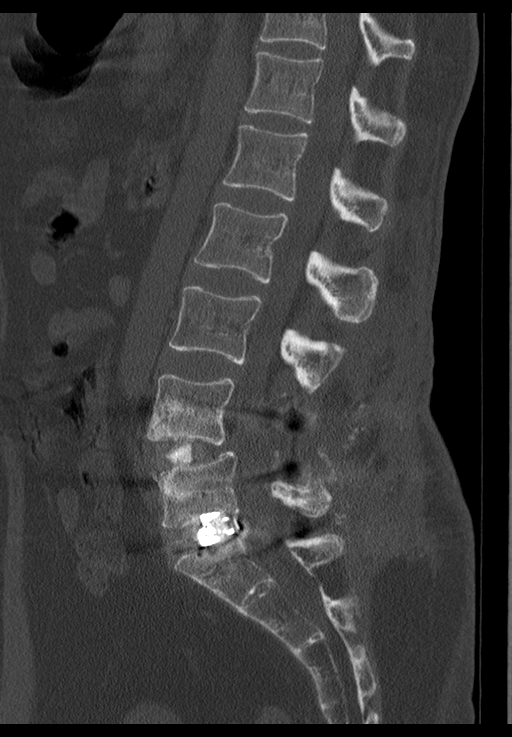
[im 42/72  bone]
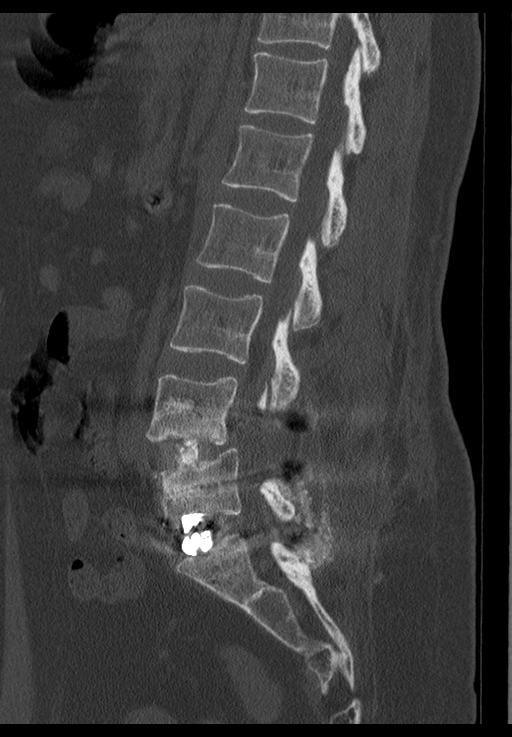
[im 48/72  bone]
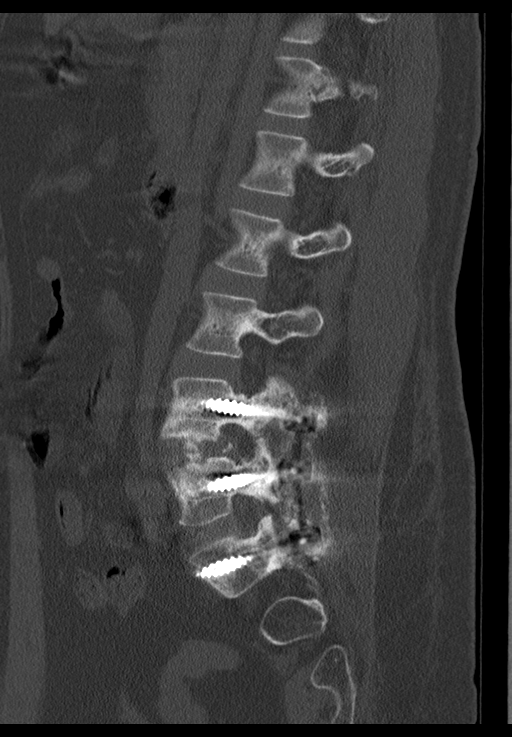

[Series 8: axial (person_name) · axial · 0.37mm/px · z∈[-436,-276]mm · 3 of 160 slices shown, 4 images]
[im 40/160  soft-tissue]
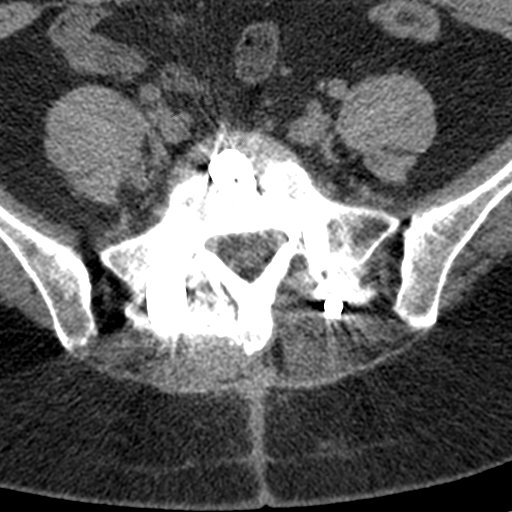
[im 40/160  bone]
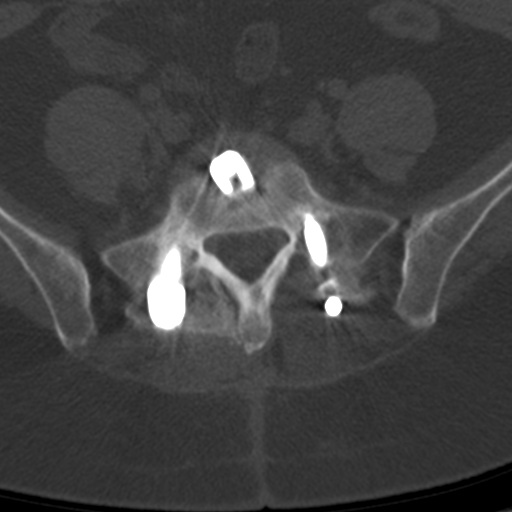
[im 80/160  bone]
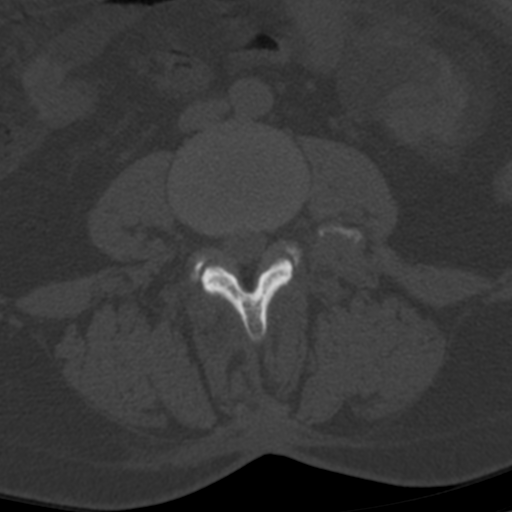
[im 120/160  bone]
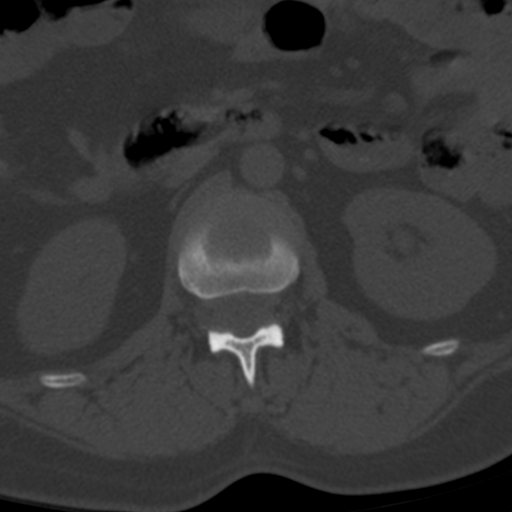

[11 of 33 positions shown; findings below may reference images not displayed]

FINDINGS: -------------------------------------------------------------------------------- 
------ 
GENERAL:  
Postsurgical changes of lumbar fusion from L4 through S1 with pedicle screws and 
posterior fusion rods. No evidence of hardware fracture. No evidence of hardware 
loosening. Discectomy with intervertebral spacer placement at L4-L5 and L5-S1. 
Since the prior CT, the posterior hardware fusion is new, as is discectomy at 
L5-S1. Partial bilateral facet resection at L4-L5. Left facet resection L5-S1. 
Posterior laminectomy at L4 and L5. 
Alignment demonstrates mild retrolisthesis of L2 on L3 and L3 on L4. Mild 
anterolisthesis of L4 on L5. No acute fracture. No focal suspect lytic or 
blastic lesions. Visualized retroperitoneal soft tissues are unremarkable. 
-------------------------------------------------------------------------------- 
------ 
SEGMENTAL: 
T12-L1: No significant central canal or neural foraminal narrowing.  
L1-L2: No significant central canal or neural foraminal narrowing.  
L2-L3: No significant central canal or neural foraminal narrowing.  
L3-L4: No significant central canal or neural foraminal narrowing.  
L4-L5: Laminectomy, discectomy, fusion. Uncovering of the disc space. Residual 
disc bulge eccentric to the right. No significant central canal narrowing. No 
significant left neural foraminal narrowing. Moderate right neural foraminal 
narrowing. 
L5-S1: Laminectomy, discectomy, fusion. No significant central canal narrowing. 
No significant neural foraminal narrowing. 
-------------------------------------------------------------------------------- 
------
IMPRESSION: 1.  Postsurgical as well as discogenic/degenerative changes as above. 
2.  No evidence of hardware failure. 
3.  No acute fracture. 
4.  Moderate right neural foraminal narrowing at L4-L5.  
RADIATION DOSE REDUCTION: All CT scans are performed using radiation dose 
reduction techniques, when applicable.  Technical factors are evaluated and 
adjusted to ensure appropriate moderation of exposure.  Automated dose 
management technology is applied to adjust the radiation doses to minimize 
exposure while achieving diagnostic quality images.

## 2022-01-01 ENCOUNTER — Telehealth (INDEPENDENT_AMBULATORY_CARE_PROVIDER_SITE_OTHER): Payer: Self-pay | Admitting: Orthopaedic Surgery of the Spine

## 2022-01-01 NOTE — Telephone Encounter (Signed)
Caller: Patient     Phone# [phone]    Reason for the call: patient is calling requesting a MCVV with Dr. Christa See. Per protocol we are not allowed to schedule if patient lives out of [place]. Patient lives in [place].   Patient is stating Dr. Christa See is aware and has not been in issue before.    Please advice          LOV: 11/26/2021  PLAN/DISCUSSION: Reviewed clinical history as well as exam results and imaging findings with patient in detail. She recently underwent revision lumbar fusion procedure at OSH in [place], although the nature of her surgery is not entirely clear given lack of outside reports and imaging. We have deferred to her current surgeon for postop regimen and provided reassurance regarding postop recovery and PT plan.  All questions were answered and Calisha Tindel understood and was satisfied with this plan.     FOLLOWUP: Return to clinic: prn. The patient is encouraged to call us with any questions or problems in the interim. Our contact numbers were given to the patient.        If response is needed please route to appropriate pertaining pool.    Caller has been advised to please allow 24 (Urgent) / 72 (Non-Urgent) hours for response

## 2022-01-05 ENCOUNTER — Ambulatory Visit (HOSPITAL_BASED_OUTPATIENT_CLINIC_OR_DEPARTMENT_OTHER): Payer: BLUE CROSS/BLUE SHIELD | Admitting: Orthopaedic Surgery of the Spine

## 2022-01-06 NOTE — Telephone Encounter (Signed)
Outbound call place, spoke with Pt. Pt has been successfully scheduled MCVV for:    01/12/2022 at 4:40 PM    Pt voiced understanding and was very appreciative.     Matter resolved.

## 2022-01-12 ENCOUNTER — Telehealth (HOSPITAL_BASED_OUTPATIENT_CLINIC_OR_DEPARTMENT_OTHER): Payer: Self-pay | Admitting: Orthopaedic Surgery of the Spine

## 2022-01-15 ENCOUNTER — Other Ambulatory Visit: Payer: Self-pay

## 2022-01-16 ENCOUNTER — Encounter (INDEPENDENT_AMBULATORY_CARE_PROVIDER_SITE_OTHER): Payer: Self-pay | Admitting: Orthopaedic Surgery of the Spine

## 2022-01-20 NOTE — Telephone Encounter (Signed)
PLAN/DISCUSSION: Reviewed clinical history as well as exam results and imaging findings with patient in detail. She recently underwent revision lumbar fusion procedure at OSH in [place], although the nature of her surgery is not entirely clear given lack of outside reports and imaging. We have deferred to her current surgeon for postop regimen and provided reassurance regarding postop recovery and PT plan.  All questions were answered and Haley Barrett understood and was satisfied with this plan.     FOLLOWUP: Return to clinic: prn. The patient is encouraged to call us with any questions or problems in the interim. Our contact numbers were given to the patient.

## 2022-01-20 NOTE — Telephone Encounter (Signed)
Noted. Did the patient have further questions?  Dr. Herma Carson plan is int he note

## 2022-01-21 NOTE — Telephone Encounter (Signed)
Pt returning missed call.unable to transfer a this time. Please f/u with pt.       Ph # [phone]  -anytime is ok to call back  -ok to leave detailed vm

## 2022-01-21 NOTE — Telephone Encounter (Signed)
Outbound call:    No answer, LVM- if patient calls back please Warm Transfer to me at  [phone] if available.

## 2022-01-21 NOTE — Telephone Encounter (Signed)
Outbound call:    Patient states these documents are for [place] to scan into chart.     No further required.

## 2022-01-24 ENCOUNTER — Encounter (INDEPENDENT_AMBULATORY_CARE_PROVIDER_SITE_OTHER): Payer: Self-pay | Admitting: Orthopaedic Surgery of the Spine

## 2022-01-26 NOTE — Telephone Encounter (Signed)
From: [name]  To: [name], MD  Sent: 01/24/2022 07:37 AM PDT  Subject: EMG for upload and review for doctor Z    Attaching my recent emg for doc z for upload and review for my upcoming appointment. There is a ct and mri imaging on [facility] as well.     Thanks,  [name]

## 2022-02-18 ENCOUNTER — Telehealth (INDEPENDENT_AMBULATORY_CARE_PROVIDER_SITE_OTHER): Payer: BLUE CROSS/BLUE SHIELD | Admitting: Orthopaedic Surgery of the Spine

## 2022-02-18 DIAGNOSIS — M549 Dorsalgia, unspecified: Secondary | ICD-10-CM

## 2022-02-18 DIAGNOSIS — Z981 Arthrodesis status: Secondary | ICD-10-CM

## 2022-02-27 IMAGING — CT CT LUMBAR SPINE WITHOUT CONTRAST
3 of 5 series · 11 of 33 positions shown, 13 images · non-contrast
Comparison: CT lumbar spine from December 23, 2021.

________________________________________________________________________________________________ 
CT LUMBAR SPINE WITHOUT CONTRAST, 02/27/2022 [DATE]: 
CLINICAL INDICATION: Progression of back pain.. 
A search for DICOM formatted images was conducted for prior CT imaging studies 
completed at a non-affiliated media free facility.
TECHNIQUE: The lumbar spine was scanned from T12 through mid-sacrum without 
contrast on a high-resolution CT scanner using dose reduction techniques. 
Routing MPR reconstructions were performed.

[Series 5: axial (person_name) · axial · 0.34mm/px · z∈[-493,-333]mm · 3 of 161 slices shown, 4 images]
[im 41/161  soft-tissue]
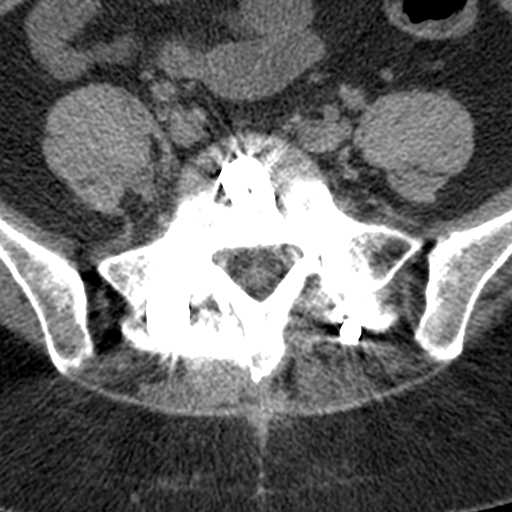
[im 41/161  bone]
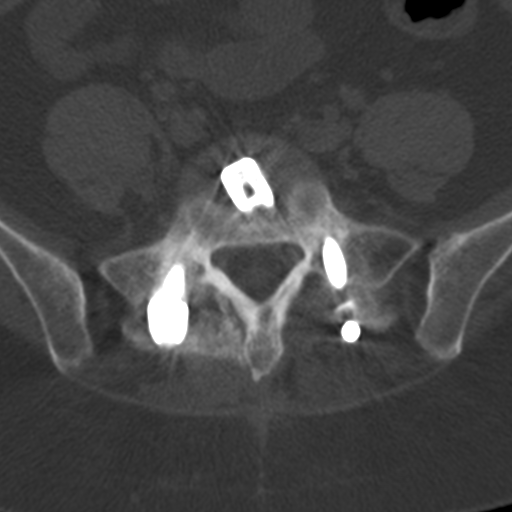
[im 81/161  bone]
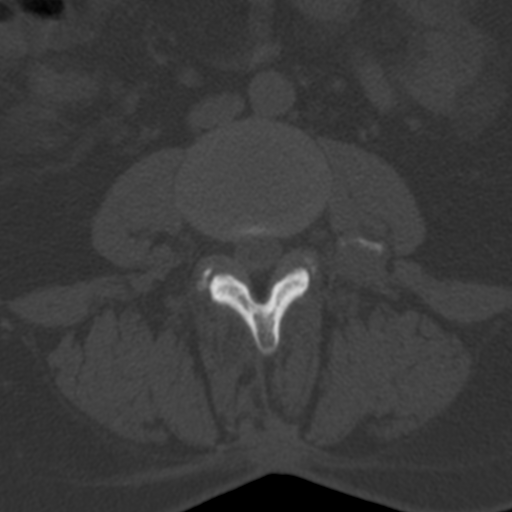
[im 121/161  bone]
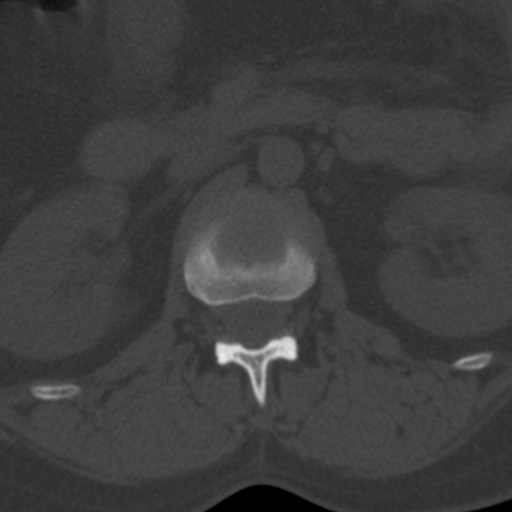

[Series 7: cor · coronal · 0.39mm/px · 3 of 81 slices shown]
[im 17/81  bone]
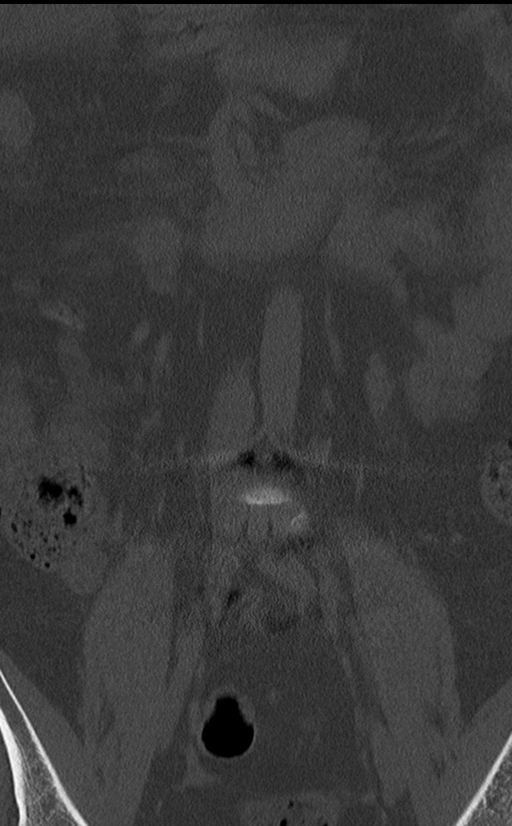
[im 33/81  bone]
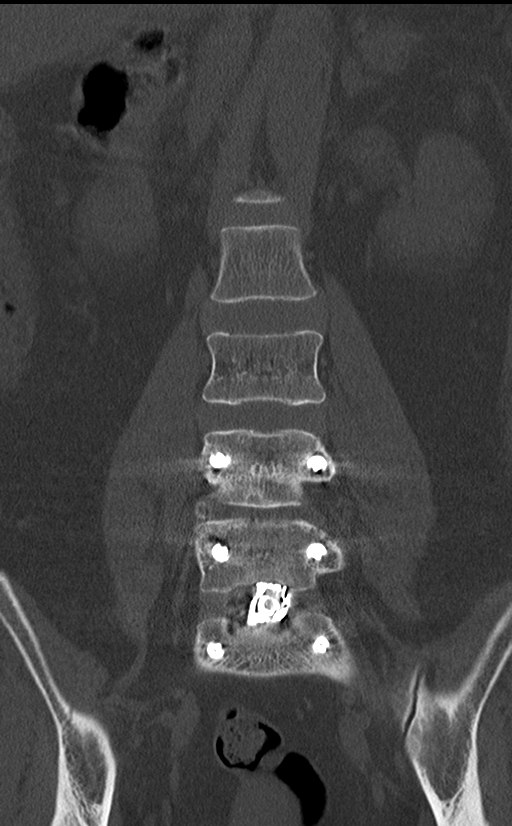
[im 49/81  bone]
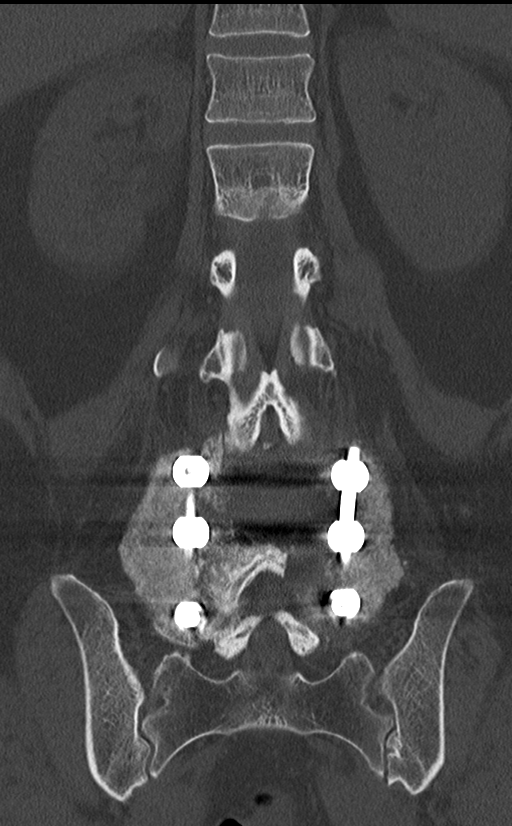

[Series 9: sag st · sagittal · 0.40mm/px · 5 of 77 slices shown, 6 images]
[im 26/77  bone]
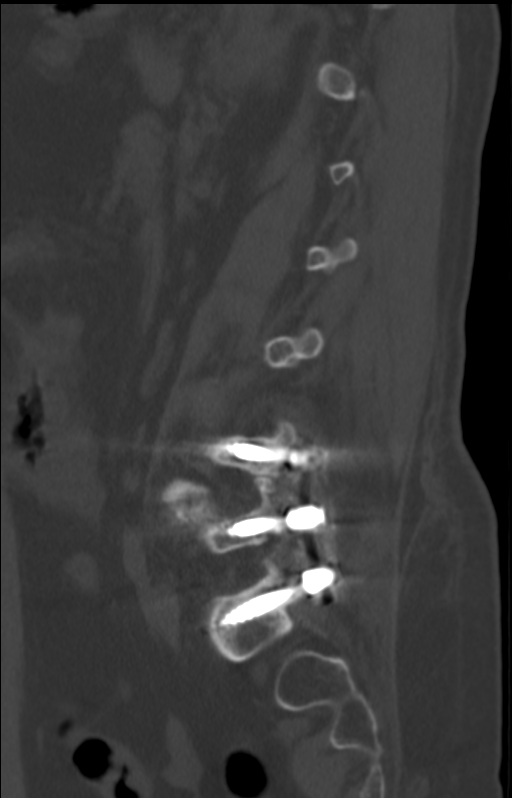
[im 32/77  bone]
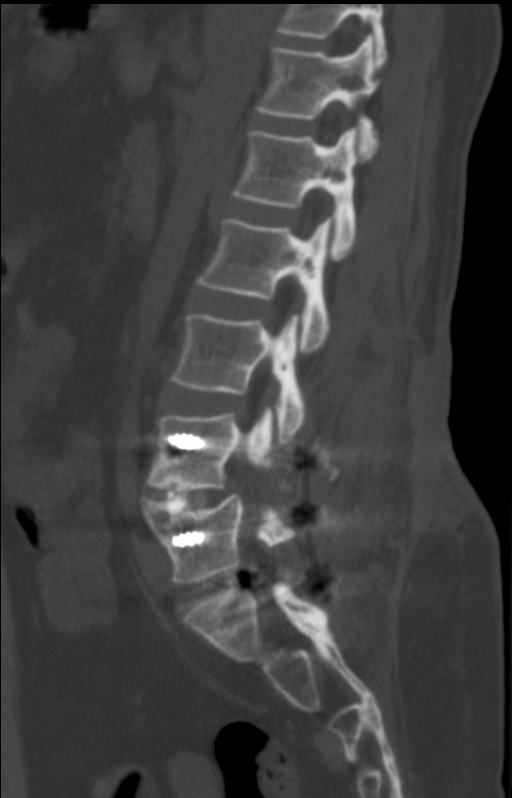
[im 39/77  soft-tissue]
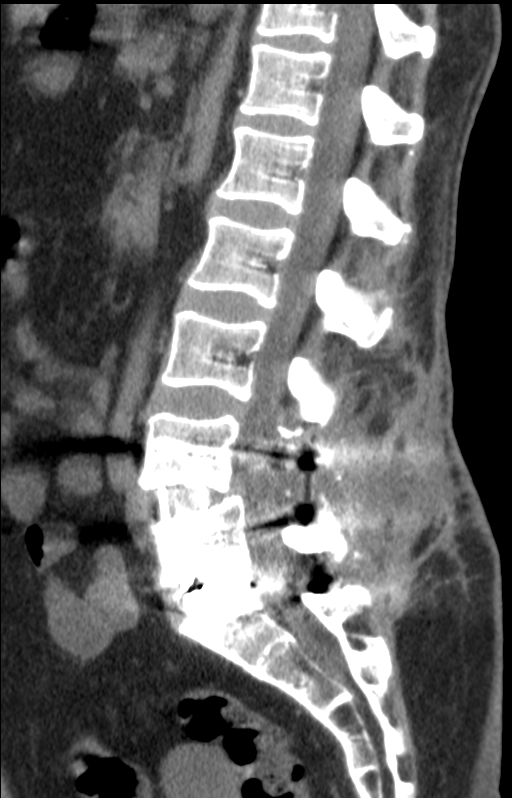
[im 39/77  bone]
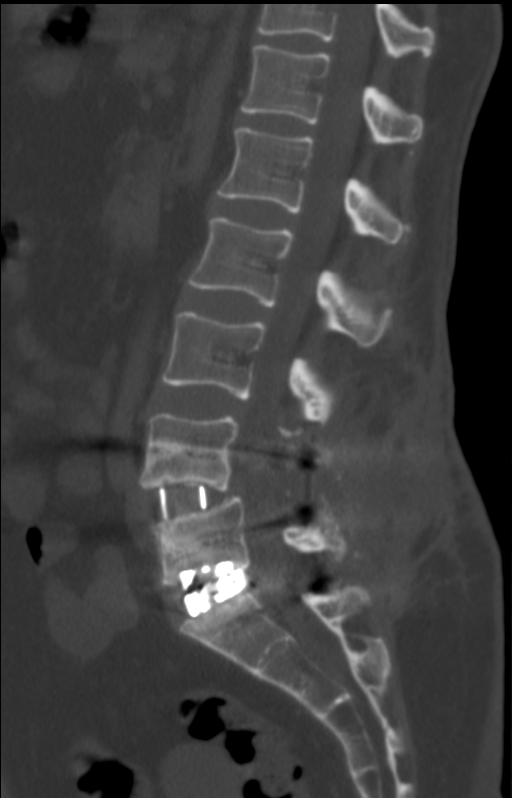
[im 45/77  bone]
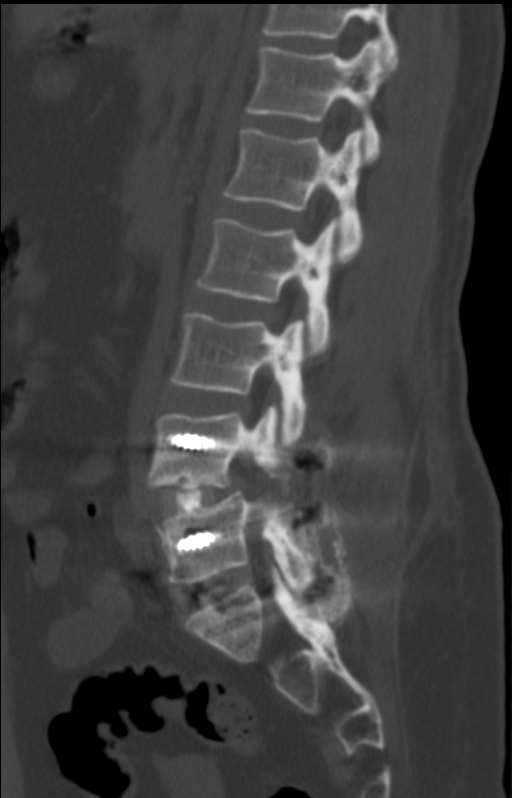
[im 51/77  bone]
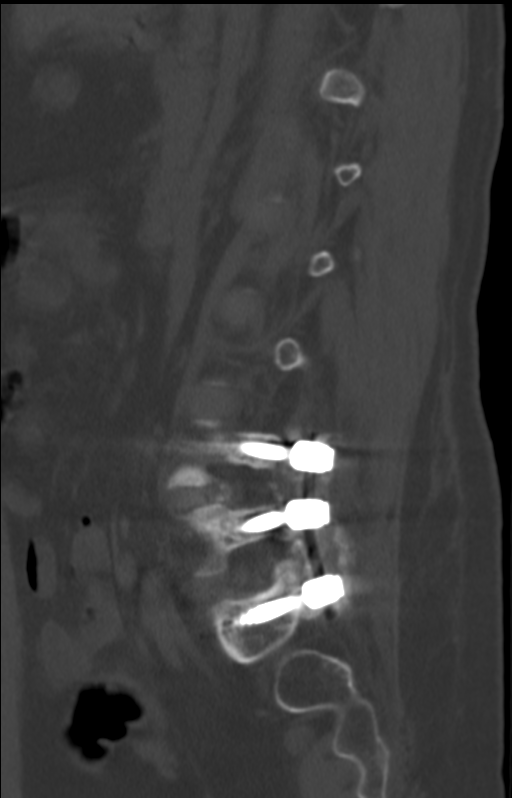

[11 of 33 positions shown; findings below may reference images not displayed]

FINDINGS: Postsurgical changes of lumbar hardware fusion from L4 through S1 with pedicle 
screws and posterior fusion rods. No evidence of hardware loosening. No evidence 
of hardware fracture. Discectomy with intervertebral spacer placement at the 
L4-L5 and L5-S1 levels. Osseous fusion across the L5-S1 spacer. L4 laminectomy 
bilaterally. Left laminectomy at L5. Bilateral facet resection at L4-L5. Left 
facet resection L5-S1.  
Alignment demonstrates minimal retrolisthesis of L2 on L3 and L3 on L4. Grade 1 
anterolisthesis of L4 on L5. No acute fracture. No focal suspect lytic or 
blastic lesions. Visualized retroperitoneal soft tissues are unremarkable. 
No change in segmental analysis compared to prior CT with stable moderate right 
neural foraminal narrowing at L4-L5.
IMPRESSION: 1.  Postsurgical as well as discogenic/degenerative changes as above. 
2.  No evidence of hardware failure. No acute fracture. 
3.  Stable moderate right neural foraminal narrowing at L4-L5.  
RADIATION DOSE REDUCTION: All CT scans are performed using radiation dose 
reduction techniques, when applicable.  Technical factors are evaluated and 
adjusted to ensure appropriate moderation of exposure.  Automated dose 
management technology is applied to adjust the radiation doses to minimize 
exposure while achieving diagnostic quality images.

## 2022-03-12 NOTE — Progress Notes (Signed)
ORTHOPAEDIC SPINE SURGERY     Visit Type: Office Visit    Reason For Visit: No chief complaint on file.    Surgery Date: 11/03/2019    Procedure: ROI and exploration of fusion L4-5     she   History Of Present Illness: 41 year old female returns to spine clinic for follow up evaluation of complaints of low back pain. She is seen again by video telehealth. She continues to have ongoing significant low back pain which she states continues to limit her activities extensively. She is being managed by a spine surgeon in [place] and she states that she had undergone revision spinal fusion several months ago with fusion to sacrum.  She states that she continues to have severe low back pain and is intermittently tearful during todays visit again. . She has ongoing progression of low back pain which is severely activity limiting. She continues with PT and HEP but this has been difficult for her.  Patient reports no changes in bowel and bladder function. Patient returns for follow up.    Allergies:   Allergies   Allergen Reactions   . Sulfa Drugs Nausea Only and Anxiety   . Tramadol Nausea and Vomiting       Medications:   Current Outpatient Medications   Medication Sig   . ALPRAZolam (XANAX) 0.25 MG tablet take 1 tablet by mouth once daily if needed   . docusate sodium (COLACE) 250 MG capsule Take 1 capsule (250 mg) by mouth at bedtime.   . gabapentin (NEURONTIN) 300 MG capsule Take 300 mg by mouth 3 times daily.   Marland Kitchen lidocaine (ASPERCREME) 4 % patch Apply 2 patches topically every 24 hours as needed (pain). Leave patch on for 12 hours, then remove for 12 hours.   Marland Kitchen oxyCODONE (ROXICODONE) 10 MG tablet Take 1 tablet (10 mg) by mouth every 3 hours as needed for Moderate Pain (Pain Score 4-6).   Marland Kitchen prochlorperazine (COMPAZINE) 5 MG tablet Take 1 tablet (5 mg) by mouth every 6 hours as needed for Nausea/Vomiting.     No current facility-administered medications for this visit.         Review of Systems:  CONSTITUTIONAL: No  unexpected weight loss, fevers, chills, or anorexia. SKIN: Noncontributory. GENITOURINARY: Symptoms as noted above in HPI. NEUROLOGIC: Symptoms as noted above in HPI. MUSCULOSKELETAL: Symptoms as noted above in HPI.    Physical Examination:  There were no vitals taken for this visit.   Deferred due to video visit.  PATHOLOGIC REFLEXES    IMAGING STUDIES:   Plain radiographs from outside facility uploaded and shows evidence of fusion with retained instrumentation L4 thru sacrum, no evidence of loosening or failure.    OTHER MEDICAL RECORDS/IMAGING STUDIES REVIEWED: None    IMPRESSION: Low back pain. s/p lumbar fusion L4 thru sacrum.    PLAN/DISCUSSION: Reviewed clinical history as well as exam results and imaging findings with patient in detail. She has undergone revision lumbar fusion in [place] and is currently being closely followed by outside surgeon. Reviewed imaging which shows instrumentation in tact without any evidence of loosening or failure. She continues to have significant pain and is at times again tearful during video visit. It is unclear what the source of her pain is. Recommended ongoing pain management and PT regimen. She inquired about additional surgeons in the area that she could seek out for possible additional surgical address, but I am not familiar with any in that region. Continued to encourage ongoing pain management  options to establish a source and treatment of her pain complaints. All questions were answered and Sami Roes understood and was satisfied with this plan. Over 30 minutes were spent in discussion and formulation of plan in addition to review of outside records and imaging and coordination of telecommunication visit and difficulty with connectivity issues.    FOLLOWUP: Return to clinic: prn. The patient is encouraged to call us with any questions or problems in the interim. Our contact numbers were given to the patient.      ---------------------(data below generated by Hyman Hopes, MD)--------------------     Patient Verification & Telemedicine Consent & Financial Waiver:    1.   Identity: I have verified this patient's identity to be accurate.  2.   Consent: I verify consent has been secured in one of the following methods: (a) obtained written/ online attestation consent (via MyChartVideoVisit pathway), (b) the spoke-side provider has obtained verbal or written consent from patient/surrogate (if this is a "provider to provider" evaluation), or (c) in all other cases, I have personally obtained verbal consent from the patient/ surrogate (noting all elements below) to perform this voluntary telemedicine evaluation (including obtaining history, performing examination and reviewing data provided by the patient).   The patient/ surrogate has the right to refuse this evaluation.  I have explained risks (including potential loss of confidentiality), benefits, alternatives, and the potential need for subsequent face to face care. Patient/ surrogate understands that there is a risk of medical inaccuracies given that our recommendations will be made based on reported data (and we must therefore assume this information is accurate).  Knowing that there is a risk that this information is not reported accurately, and that the telemedicine video, audio, or data feed may be incomplete, the patient agrees to proceed with evaluation and holds [place] harmless knowing these risks.  3.   Healthcare Team: The patient/ surrogate has been notified that other healthcare professionals (including students, residents and Engineer, maintenance) may be involved in this audio-video evaluation.   All laws concerning confidentiality and patient access to medical records and copies of medical records apply to telemedicine.  4.   Privacy: If this is a Restaurant manager, fast food Visit, the patient/ surrogate has received the [place] Notice of Privacy Practices via E-Checkin process.  For all other video visit techniques, I have verbally  provided the patient/ surrogate with the [place] web link in [place] (https://health.[email or web address].si.aspx) or Spanish (https://health.L[employer]ishTo[employer].ch.aspx).  The patient/ surrogate acknowledges both being provided the NPP link, and has been offered to have the NPP mailed to the patient/ surrogate by [place] mail.  The patient/ surrogate has voiced understanding an acknowledgement of receipt of this NPP web address.  If the patient/surrogate has elected to receive the NPP via [place] mail, I verify that the NPP will be sent promptly to the patient/surrogate via [place] mail.  5.   Capacity: I have reviewed this above verification and consent paragraph with the patient/ surrogate and the patient is capacitated or has a surrogate. If the patient is not capacitated to understand the above, and no surrogate is available, since this is not an emergency evaluation, the visit will be rescheduled until such time that the patient can consent, or the surrogate is available to consent. If this is an emergency evaluation and the patient is not capacitated to understand the above, and no surrogate is available, I am proceeding with this evaluation as this is felt to be an emergency setting and no appropriate specialist is available at  the bedside to perform these evaluations.  6.   Financial Waiver: If this is a Restaurant manager, fast food Visit, the patient has been made aware of the financial waiver via E-Checkin process.  For all other video visit techniques, an E-Checkin process is not performed.  As such, I have personally verbally informed the patient/ surrogate that this evaluation will be a billable encounter similar to an in-person clinic visit, and the patient/ surrogate has agreed to pay the fee for services rendered.  If we are billing insurance for the patient's telehealth visit, her out-of-pocket cost will be determined based on her plan and will be billed to her.  The patient/ surrogate has also been informed that if the  patient does not have insurance or does not wish to use insurance, [facility] [address] [employer] price for a primary care telehealth visit is $59.00 and specialist telehealth visit is $88.00.  I have further informed the patient/ surrogate that in the event the patient has additional services provided in conjunction with the specialty visit (Ex. Psychotherapy services), those services will be billed at the current rate less a 45% discount.  7.   Intra-State Location: The patient/ surrogate attests to understanding that if the patient accesses these services from a location outside of [place], that the patient does so at the patient's own risk and initiative and that the patient is ultimately responsible for compliance with any laws or regulations associated with the patient's use.  8.   Specific Use:The patient/ surrogate understands that [facility] makes no representation that materials or servicesdelivered via telecommunication services, or listed on telemedicine websites, are appropriate or available for use in any other location.           Demographics:  Medical Record #: [id number]  Date: February 18, 2022  Patient Name: Haley Barrett  DOB: 11-Nov-1980  Age: 41 year old  Sex: female  Location: Home address on file     Evaluator(s):  Shloka Baldridge was evaluated by me today.    Clinic Location: [facility]    [facility]  [address]  [place] [address]

## 2022-05-29 IMAGING — CT CT LUMBAR SPINE WITHOUT CONTRAST
3 of 13 series · 9 of 33 positions shown, 11 images · non-contrast
Comparison: CT lumbar spine from February 27, 2022.

________________________________________________________________________________________________ 
CT LUMBAR SPINE WITHOUT CONTRAST, 05/29/2022 [DATE]: 
CLINICAL INDICATION: Chronic low back pain. Radiation down bilateral legs. 
History of spinal fusion 
A search for DICOM formatted images was conducted for prior CT imaging studies 
completed at a non-affiliated media free facility.
TECHNIQUE: The lumbar spine was scanned from T12 through mid-sacrum without 
contrast on a high-resolution CT scanner using dose reduction techniques.

[Series 5: l spine coronal · coronal · 0.34mm/px · 3 of 77 slices shown]
[im 16/77  bone]
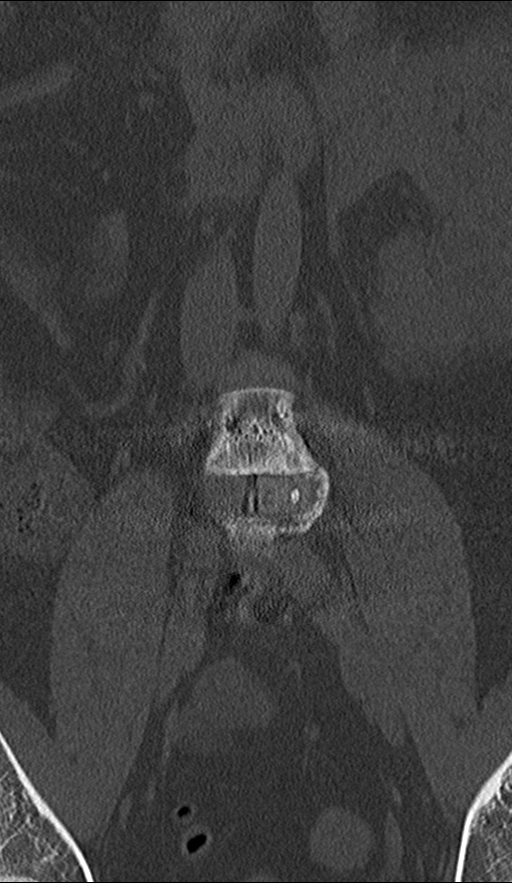
[im 31/77  bone]
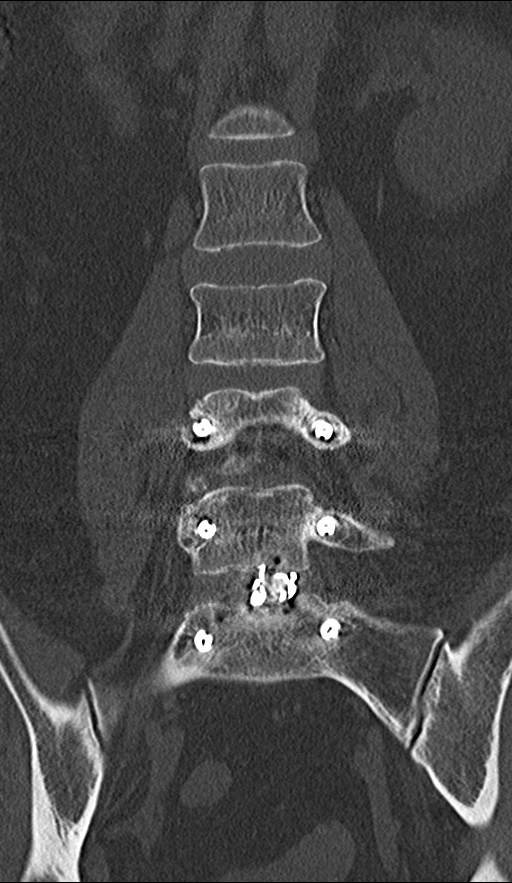
[im 46/77  bone]
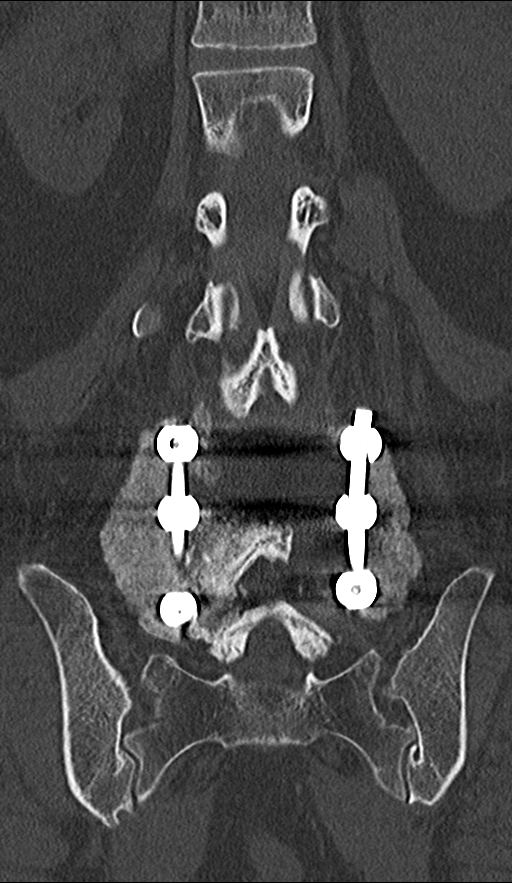

[Series 8: sag · sagittal · 0.32mm/px · 5 of 81 slices shown, 6 images]
[im 27/81  bone]
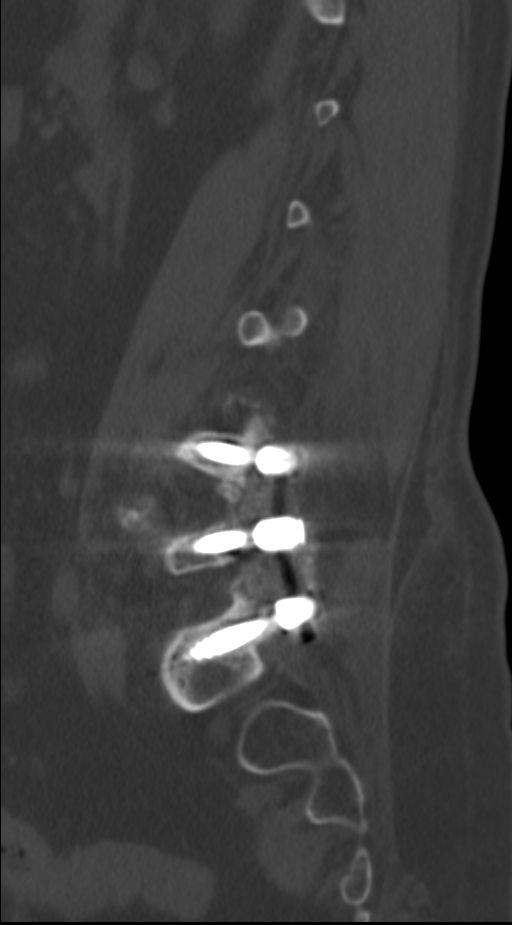
[im 34/81  bone]
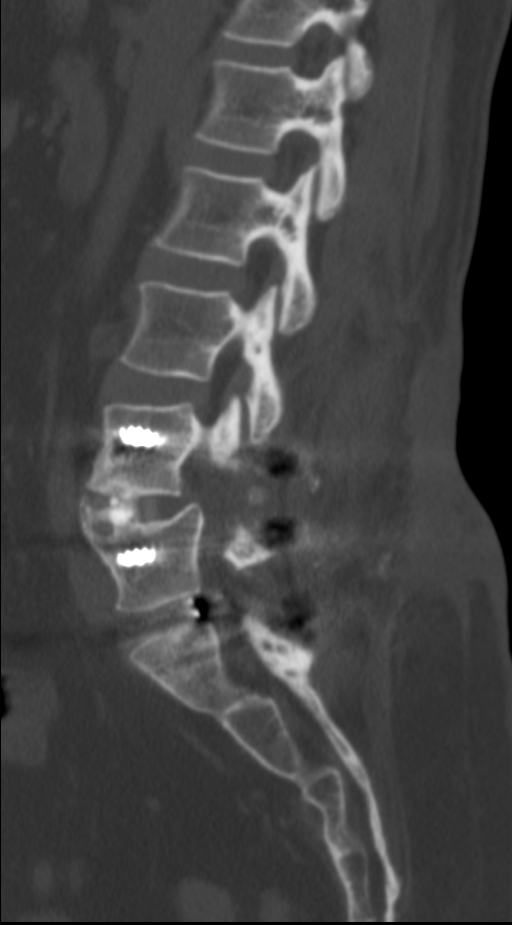
[im 41/81  soft-tissue]
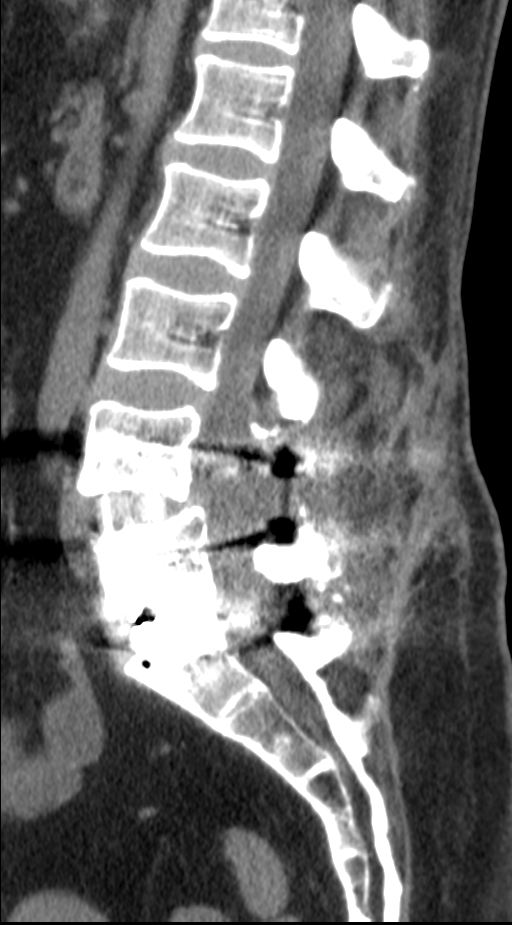
[im 41/81  bone]
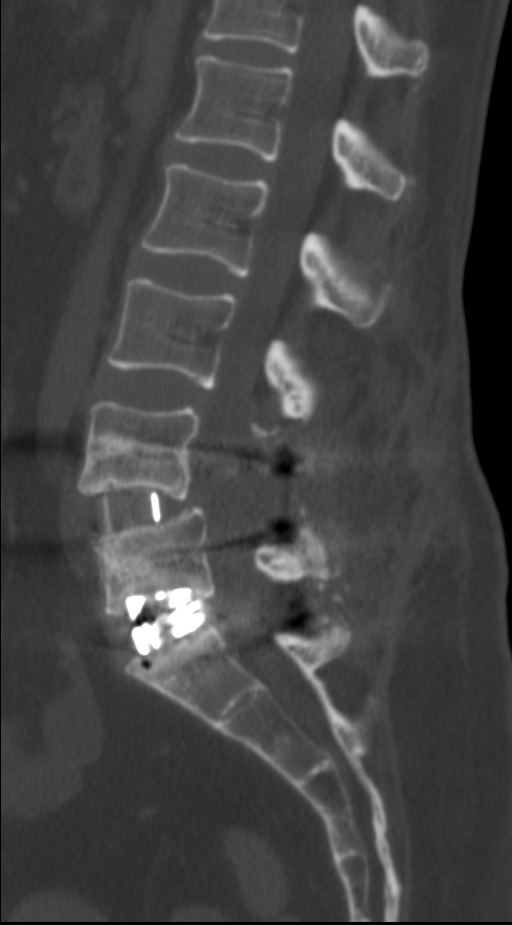
[im 47/81  bone]
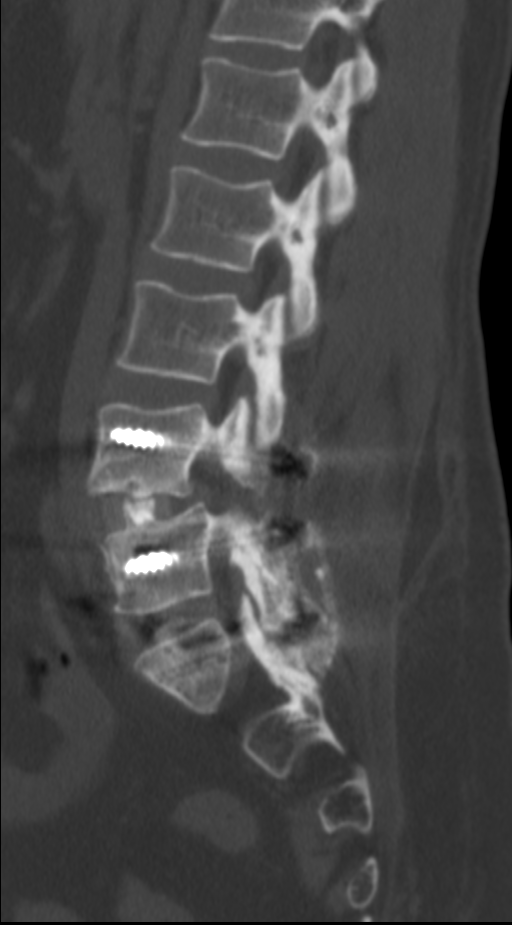
[im 54/81  bone]
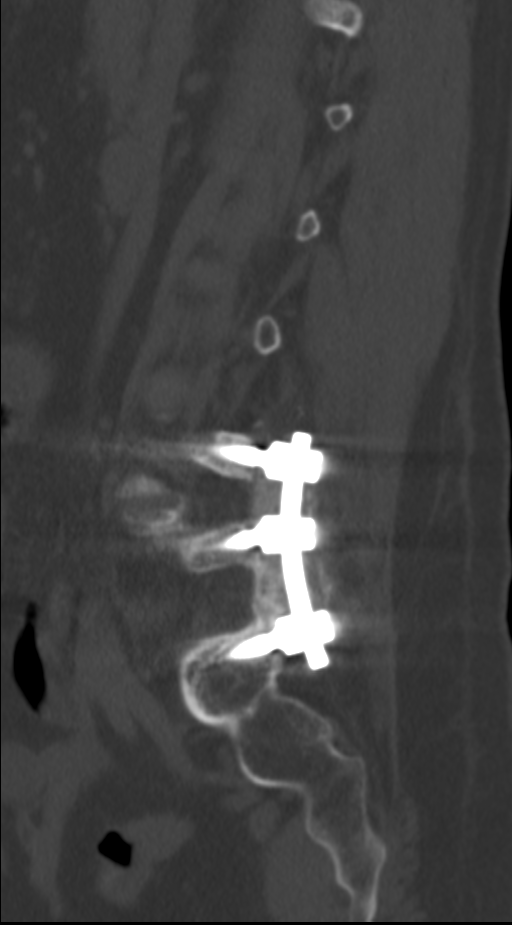

[Series 9: l spine 2.0 (person_name)31(person_name) · axial · 0.34mm/px · z∈[+832,+832]mm · 1 of 150 slices shown, 2 images]
[im 75/150  soft-tissue]
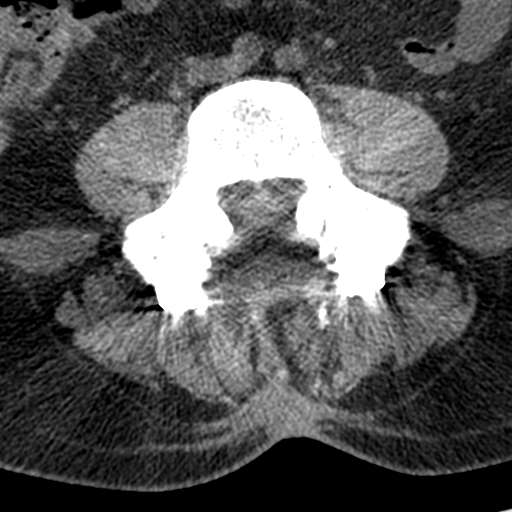
[im 75/150  bone]
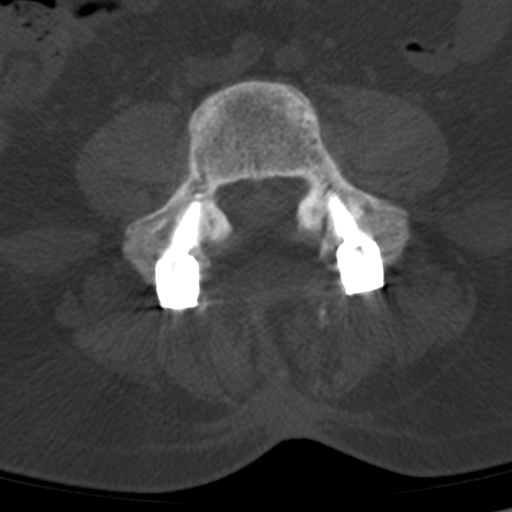

[9 of 33 positions shown; findings below may reference images not displayed]

FINDINGS: -------------------------------------------------------------------------------- 
------ 
GENERAL: 
Postsurgical changes of posterior lumbar hardware fusion from L4 through S1 with 
bilateral pedicle screws and posterior fusion rods. Presence of bone graft 
material surrounding the bilateral posterior fusion rods. No hardware loosening. 
No hardware fracture. 
Status post discectomy with intervertebral spacer placement at L4-L5 and L5-S1 
levels. Osseous fusion is present across the L5-S1 intervertebral spacer, 
however no clear osseous fusion across the L4-L5 intervertebral spacer, stable. 
Laminectomy at L4-L5 and L5-S1. Bilateral facetectomy at L4-L5. Left facetectomy 
at L5-S1. No acute fracture. No focal suspicious lytic or blastic lesions. There 
is sclerotic degenerative change at L4-L5 and L5-S1. 
Stable alignment with mild anterolisthesis of L4 relative to L3 and L5. Stable 
mild retrolisthesis of L5 relative to S1. Visualized retroperitoneal and 
intra-abdominal soft tissues are unremarkable. 
-------------------------------------------------------------------------------- 
------ 
SEGMENTAL: 
Multilevel discogenic/degenerative changes are present. 
Levels with moderate/severe central canal narrowing: None. 
Levels with moderate/severe neural foraminal narrowing: L4-L5 (moderate right, 
stable). 
-------------------------------------------------------------------------------- 
------
IMPRESSION: 1.  Postsurgical as well as discogenic/degenerative changes as above. 
2.  No evidence of hardware failure. No acute fracture. 
3.  Stable moderate right neural foraminal narrowing at L4-L5.  
RADIATION DOSE REDUCTION: All CT scans are performed using radiation dose 
reduction techniques, when applicable.  Technical factors are evaluated and 
adjusted to ensure appropriate moderation of exposure.  Automated dose 
management technology is applied to adjust the radiation doses to minimize 
exposure while achieving diagnostic quality images.

## 2022-06-12 ENCOUNTER — Encounter (INDEPENDENT_AMBULATORY_CARE_PROVIDER_SITE_OTHER): Payer: Self-pay | Admitting: Orthopaedic Surgery of the Spine

## 2022-06-12 NOTE — Telephone Encounter (Signed)
From: [name]  To: [name], MD  Sent: 15/05/4584 1:03 PM PDT  Subject: Failing Fusion #2    Hi Doc Z and staff, would you please let him know that I am failing yet another fusion L4-S1 this time and they have no idea why this is happening to me. I am getting bloodwork done shortly to see if there is any underlying issue that could cause this in an otherwise healthy person. I hope that is the case and something that is easily fixable but I suspect it's more about the whole structure of my spine that pulls this thing apart much more quickly than they realise. I'm wondering if he has any advice? Or someone he knows in [facility] that could look? I got paperwork I guess meant to be for a referral to someone he knows, but not who the actual person is. The doctors seem to be totally confused about what this is or why it's happening and really what to do about it and besides that I know I'm in far too much pain after the surgery and during the process, which makes me realise really quickly that it's unlikely to heal right I dont know what to do either.    Thanks!  Haley Barrett

## 2022-08-22 IMAGING — MR MRI LUMBAR SPINE WITHOUT CONTRAST
7 of 9 series · 15 of 48 positions shown · IV contrast (gadolinium)
Comparison: CT lumbar spine from May 29, 2022.

________________________________________________________________________________________________ 
MRI LUMBAR SPINE WITHOUT CONTRAST, 08/22/2022 [DATE]: 
CLINICAL INDICATION: Chronic and persistent back pain.
TECHNIQUE: Multiplanar, multiecho position MR images of the lumbar spine were 
performed without intravenous gadolinium enhancement. Patient was scanned on a 
1.5T magnet.

[Series 101: survey · axial · 10.0mm · 1.25mm/px · z∈[-33,+201]mm · 2 of 10 slices shown]
[im 1/10]
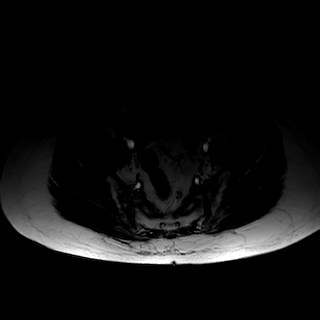
[im 10/10]
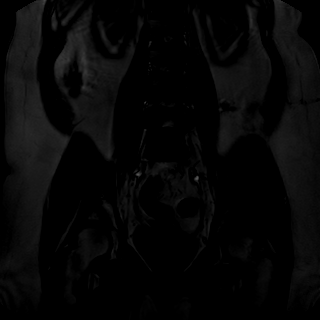

[Series 201: t2w_cor-surv · coronal · 6.0mm · 0.60mm/px · 1 of 10 slices shown]
[im 1/10]
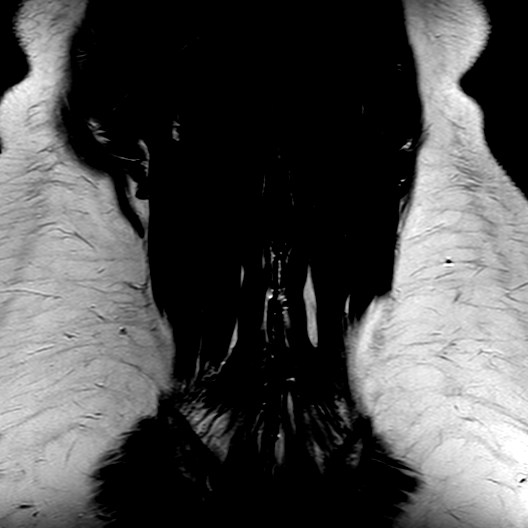

[Series 301: t1_tse_sag · sagittal · 4.0mm · 0.46mm/px · 2 of 17 slices shown]
[im 1/17]
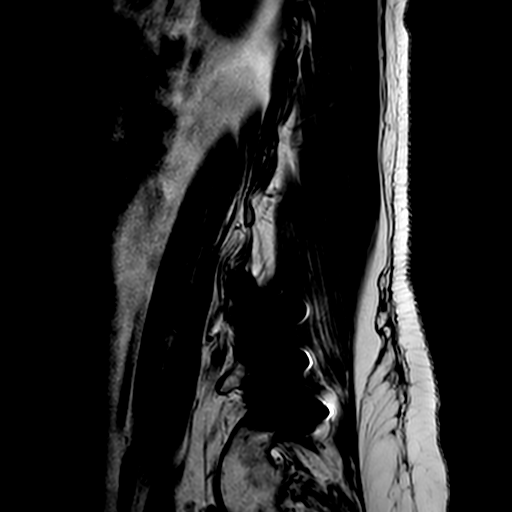
[im 17/17]
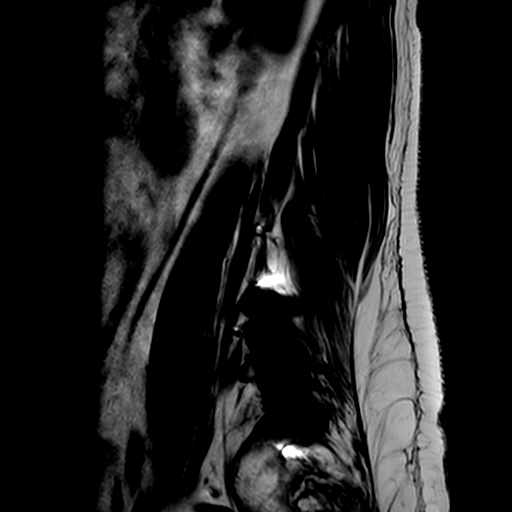

[Series 402: (id)_mdixon_tse · sagittal · 4.0mm · 0.45mm/px · 2 of 17 slices shown]
[im 1/17]
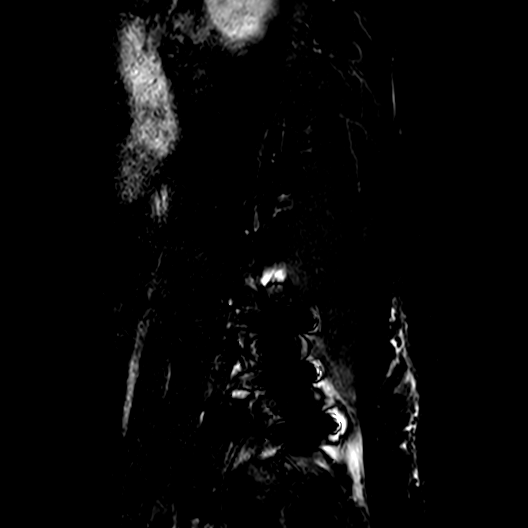
[im 17/17]
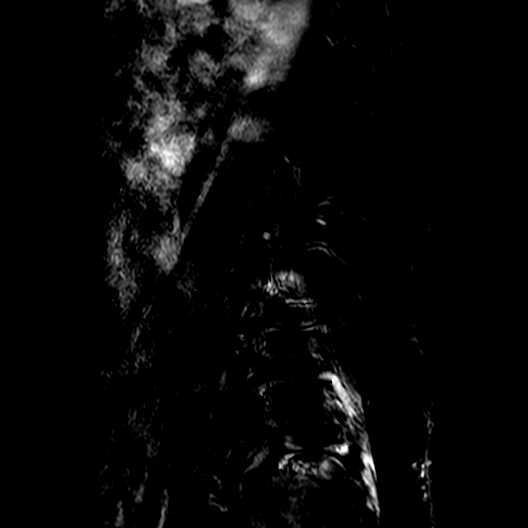

[Series 403: st2w_mdixon_tse · sagittal · 4.0mm · 0.45mm/px · 2 of 17 slices shown]
[im 1/17]
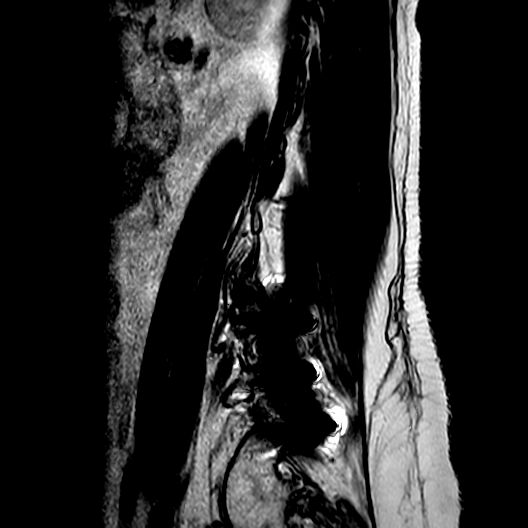
[im 17/17]
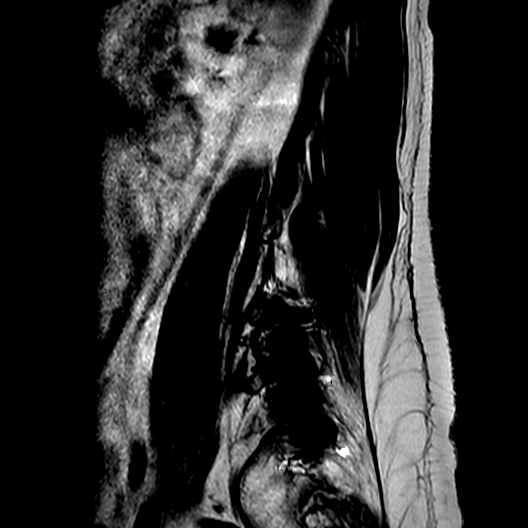

[Series 502: (id) view_ax mpr · axial · 1.0mm · 0.25mm/px · 1 of 119 slices shown]
[im 1/119]
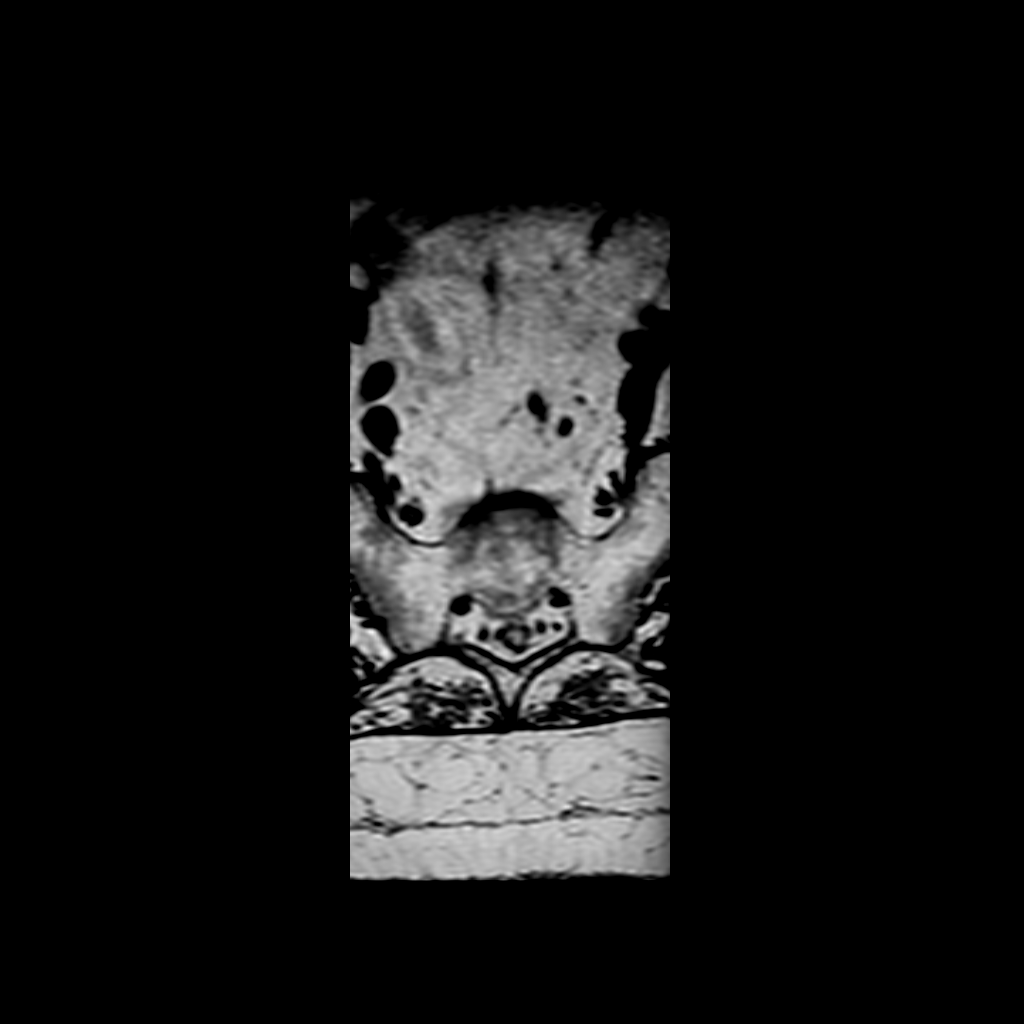

[Series 701: T1 · axial · 4.0mm · 0.33mm/px · z∈[+4,+179]mm · 5 of 40 slices shown]
[im 1/40]
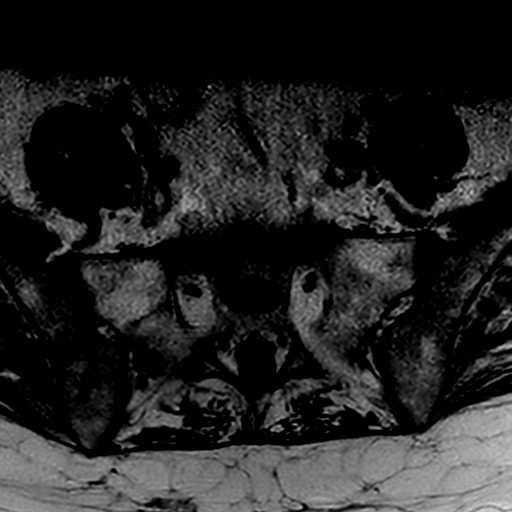
[im 10/40]
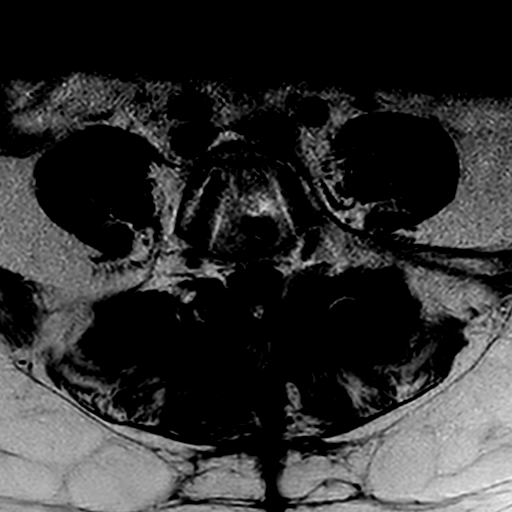
[im 20/40]
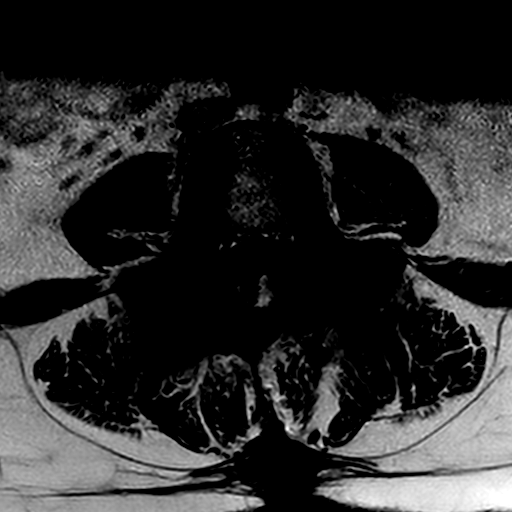
[im 30/40]
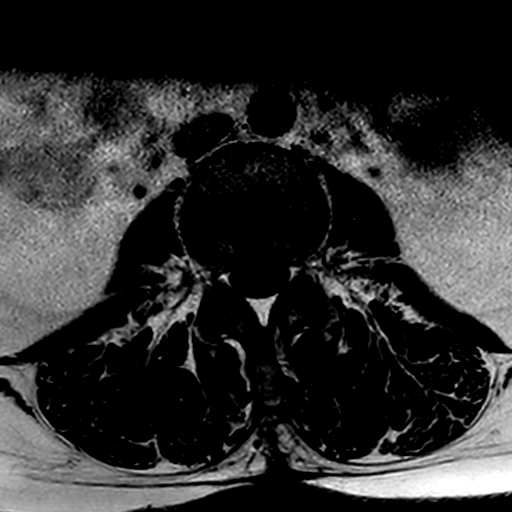
[im 40/40]
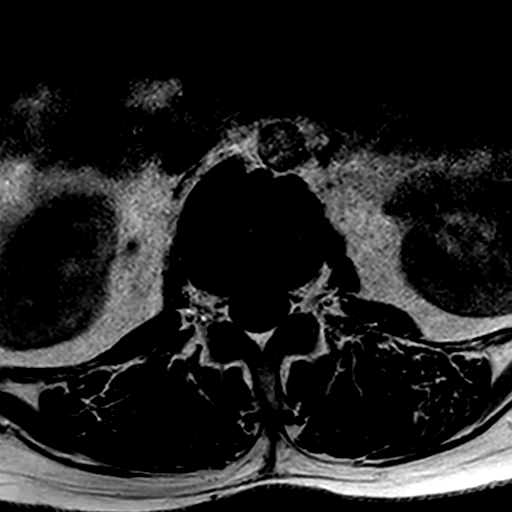

[15 of 48 positions shown; findings below may reference images not displayed]

FINDINGS: -------------------------------------------------------------------------------- 
------ 
GENERAL: 
POSTSURGICAL: Posterior hardware fusion from L4 through S1 pedicle screws and 
posterior fusion rods. Discectomy with intervertebral spacer placement at L4-L5 
and L5-S1 levels.     
ALIGNMENT: Mild retrolisthesis of L3 on L4. Mild anterolisthesis of L4 on L5. 
VERTEBRAL BODY HEIGHT: Normal.  
MARROW SIGNAL: No focal suspect signal abnormality. 
CORD SIGNAL: Normal distal spinal cord and cauda equina.  Conus terminates at 
L1-L2. 
ADDITIONAL FINDINGS: None. 
Modic I-II: None. 
Ligamentum Flavum > 2.5 mm: All levels. 
-------------------------------------------------------------------------------- 
------ 
SEGMENTAL: 
T12-L1: No significant central canal narrowing.  No significant right neural 
foraminal narrowing. No significant left neural foraminal narrowing.  
L1-L2: No significant central canal narrowing.  No significant right neural 
foraminal narrowing. No significant left neural foraminal narrowing.  
L2-L3: No significant central canal narrowing.  No significant right neural 
foraminal narrowing. No significant left neural foraminal narrowing.  
L3-L4: Bilateral facet hypertrophy. No significant central canal narrowing.  No 
significant right neural foraminal narrowing. No significant left neural 
foraminal narrowing.  
L4-L5: Laminectomy. Bilateral facetectomy. Discectomy with posterior 
displacement. No significant central canal narrowing.  Moderate right neural 
foraminal narrowing. No significant left neural foraminal narrowing.  
L5-S1: Discectomy with intervertebral spacer placement. Left mastectomy. No 
significant central canal narrowing. Bilateral  No significant right neural 
foraminal narrowing. No significant left neural foraminal narrowing.  
-------------------------------------------------------------------------------- 
------
IMPRESSION: Postsurgical as well as discogenic/degenerative changes as above. Stable 
moderate right neural foraminal narrowing at L4-L5.

## 2022-11-17 IMAGING — CT CT LUMBAR SPINE WITHOUT CONTRAST
2 of 3 series · 10 of 27 positions shown, 13 images · non-contrast
Comparison: CT lumbar spine May 29, 2022, lumbar MRI August 22, 2022

________________________________________________________________________________________________ 
CT LUMBAR SPINE WITHOUT CONTRAST, 11/17/2022 [DATE]: 
CLINICAL INDICATION: Breakdown of internal fixation of vertebra, spinal 
stenosis, back pain extending to right leg 
A search for DICOM formatted images was conducted for prior CT imaging studies 
completed at a non-affiliated media free facility.
TECHNIQUE: The lumbar spine was scanned from T12 through mid-sacrum without 
contrast on a high-resolution CT scanner using dose reduction techniques. 
Routing MPR reconstructions were performed. 3-D renderings were reconstructed on 
an independent workstation with concurrent physician supervision. . Count of 
known CT and Cardiac Nuclear Medicine studies performed in the previous 12 
months = 3.

[Series 3: axial st · axial · 0.33mm/px · z∈[-352,-136]mm · 5 of 162 slices shown, 7 images]
[im 27/162  soft-tissue]
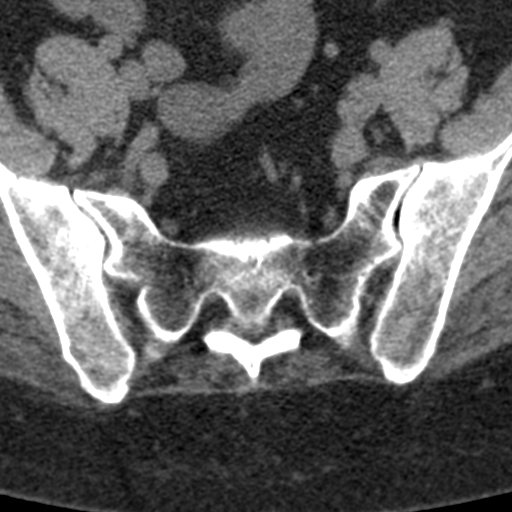
[im 27/162  bone]
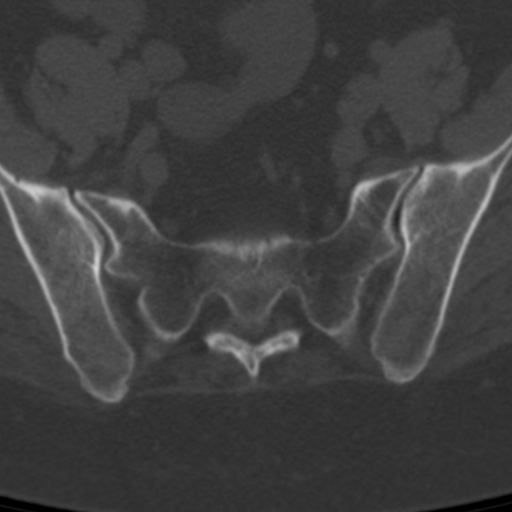
[im 54/162  bone]
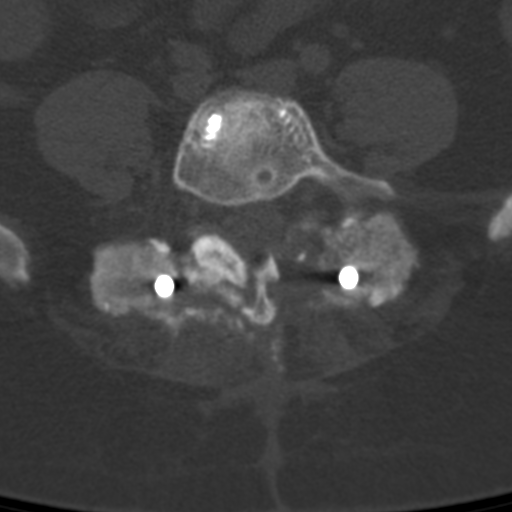
[im 81/162  bone]
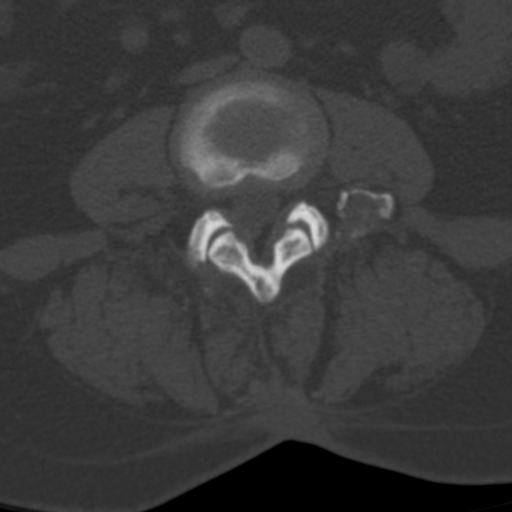
[im 108/162  bone]
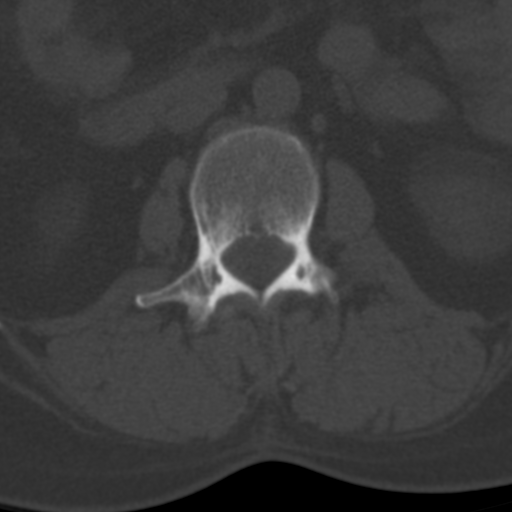
[im 135/162  soft-tissue]
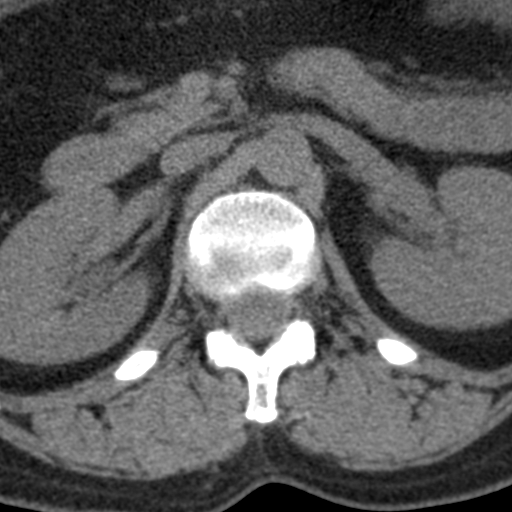
[im 135/162  bone]
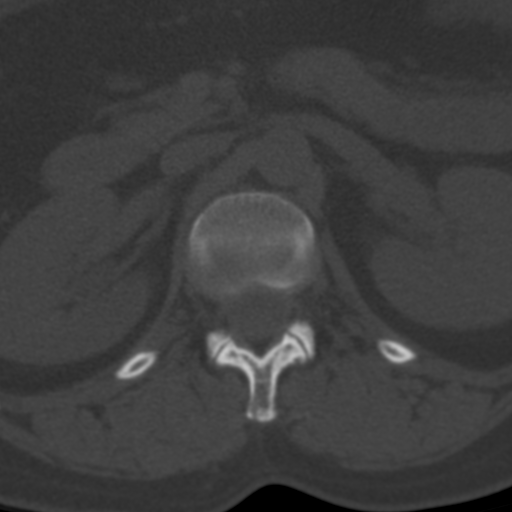

[Series 8: sag st · sagittal · 0.33mm/px · 5 of 81 slices shown, 6 images]
[im 27/81  bone]
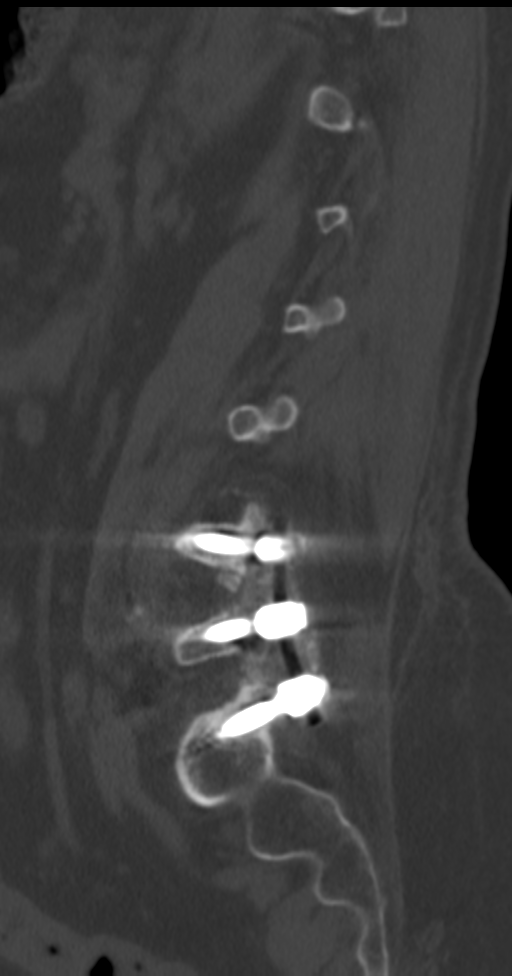
[im 34/81  bone]
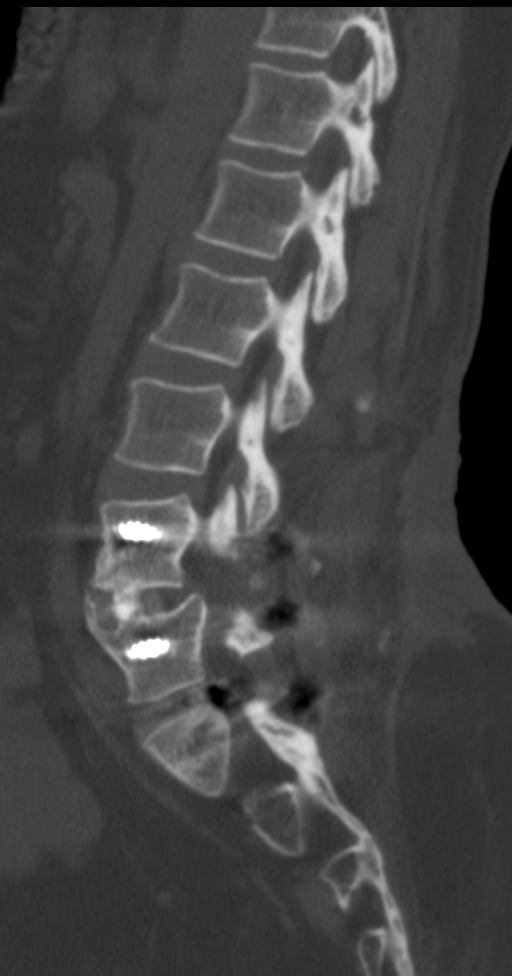
[im 41/81  soft-tissue]
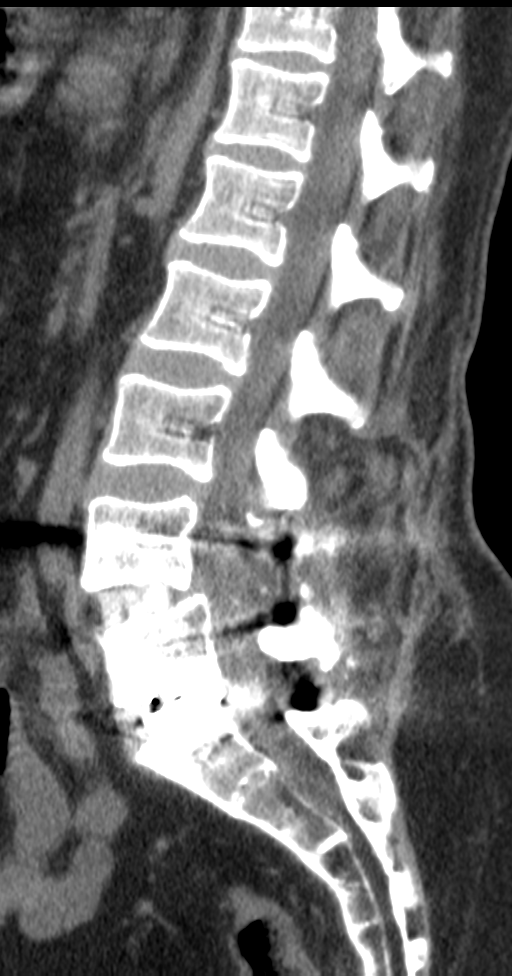
[im 41/81  bone]
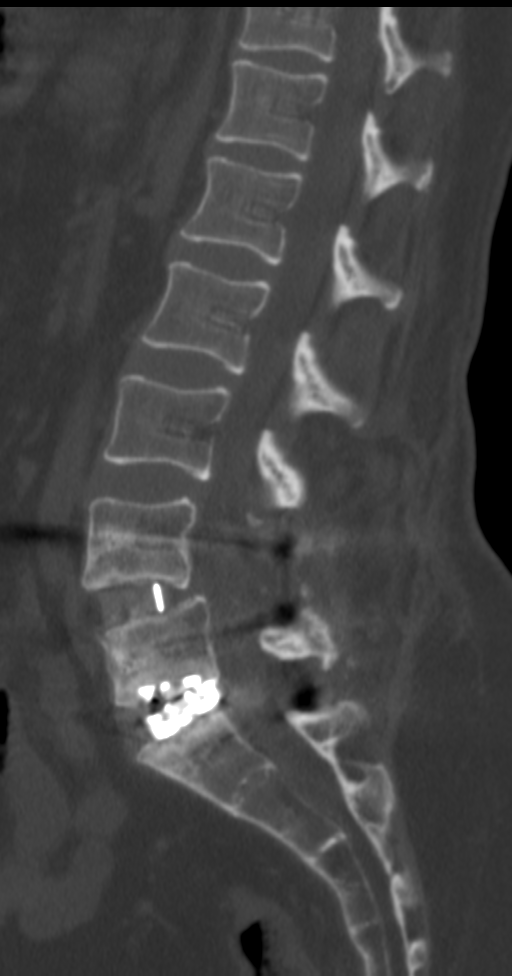
[im 47/81  bone]
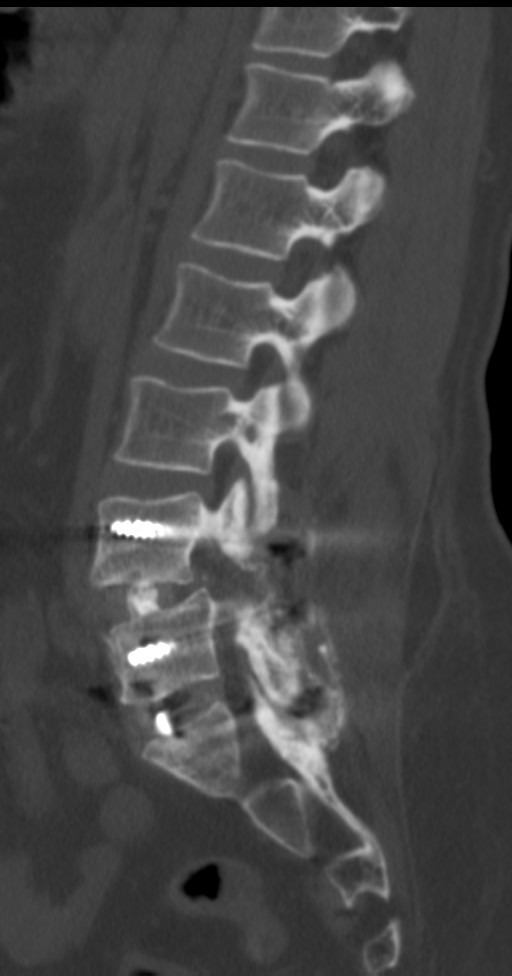
[im 54/81  bone]
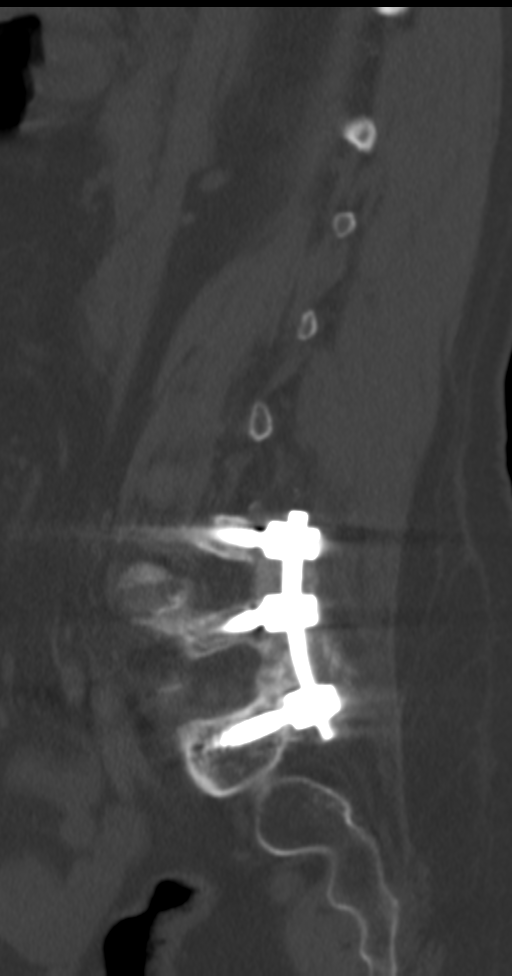

[10 of 27 positions shown; findings below may reference images not displayed]

FINDINGS: There are bilateral pedicle screws from L4 through S1, with interbody 
disc cage is in posterior element fusion bone. No hardware malposition or 
evidence for loosening or failure. Fusion bone showing its solid integration. 
Slight retrolisthesis at L5-S1 is unchanged. Slight retrolisthesis at L4-5 is 
unchanged. 
At L5-S1 there has been decompression. The canal and foramina are open. Metal 
artifact limits evaluation. 
At L4-5 there has been decompression. Canal and left foramen are open. Mild to 
moderate right foraminal narrowing is unchanged. 
At L3-4 the canal and foramina are open, disc margin is intact. 1 mm 
retrolisthesis. 
At L2-3 and L1-2 the canal and foramina are open.
IMPRESSION: No evidence for hardware failure, malposition, or loosening. 
No disc extrusion or significant canal stenosis. 
Mild to moderate right L4-5 foraminal stenosis is unchanged.  
Slight retrolisthesis at L3-4 is unchanged. 
No evidence for fracture. No bone destruction. 
RADIATION DOSE REDUCTION: All CT scans are performed using radiation dose 
reduction techniques, when applicable.  Technical factors are evaluated and 
adjusted to ensure appropriate moderation of exposure.  Automated dose 
management technology is applied to adjust the radiation doses to minimize 
exposure while achieving diagnostic quality images.

## 2023-07-30 IMAGING — DX LUMBAR SPINE 2 VIEW BENDING VIEWS ONLY
2 series · 2 of 2 positions shown · non-contrast
Comparison: CT lumbar spine November 17, 2022 and MRI lumbar spine August 22, 2022.

________________________________________________________________________________________________ 
LUMBAR SPINE 2 VIEW BENDING VIEWS ONLY, 07/30/2023 [DATE]: 
CLINICAL INDICATION: Back pain and bilateral sciatica. For prior lumbar 
surgeries. Lumbar ablation.

[lateral (1 of 2)]
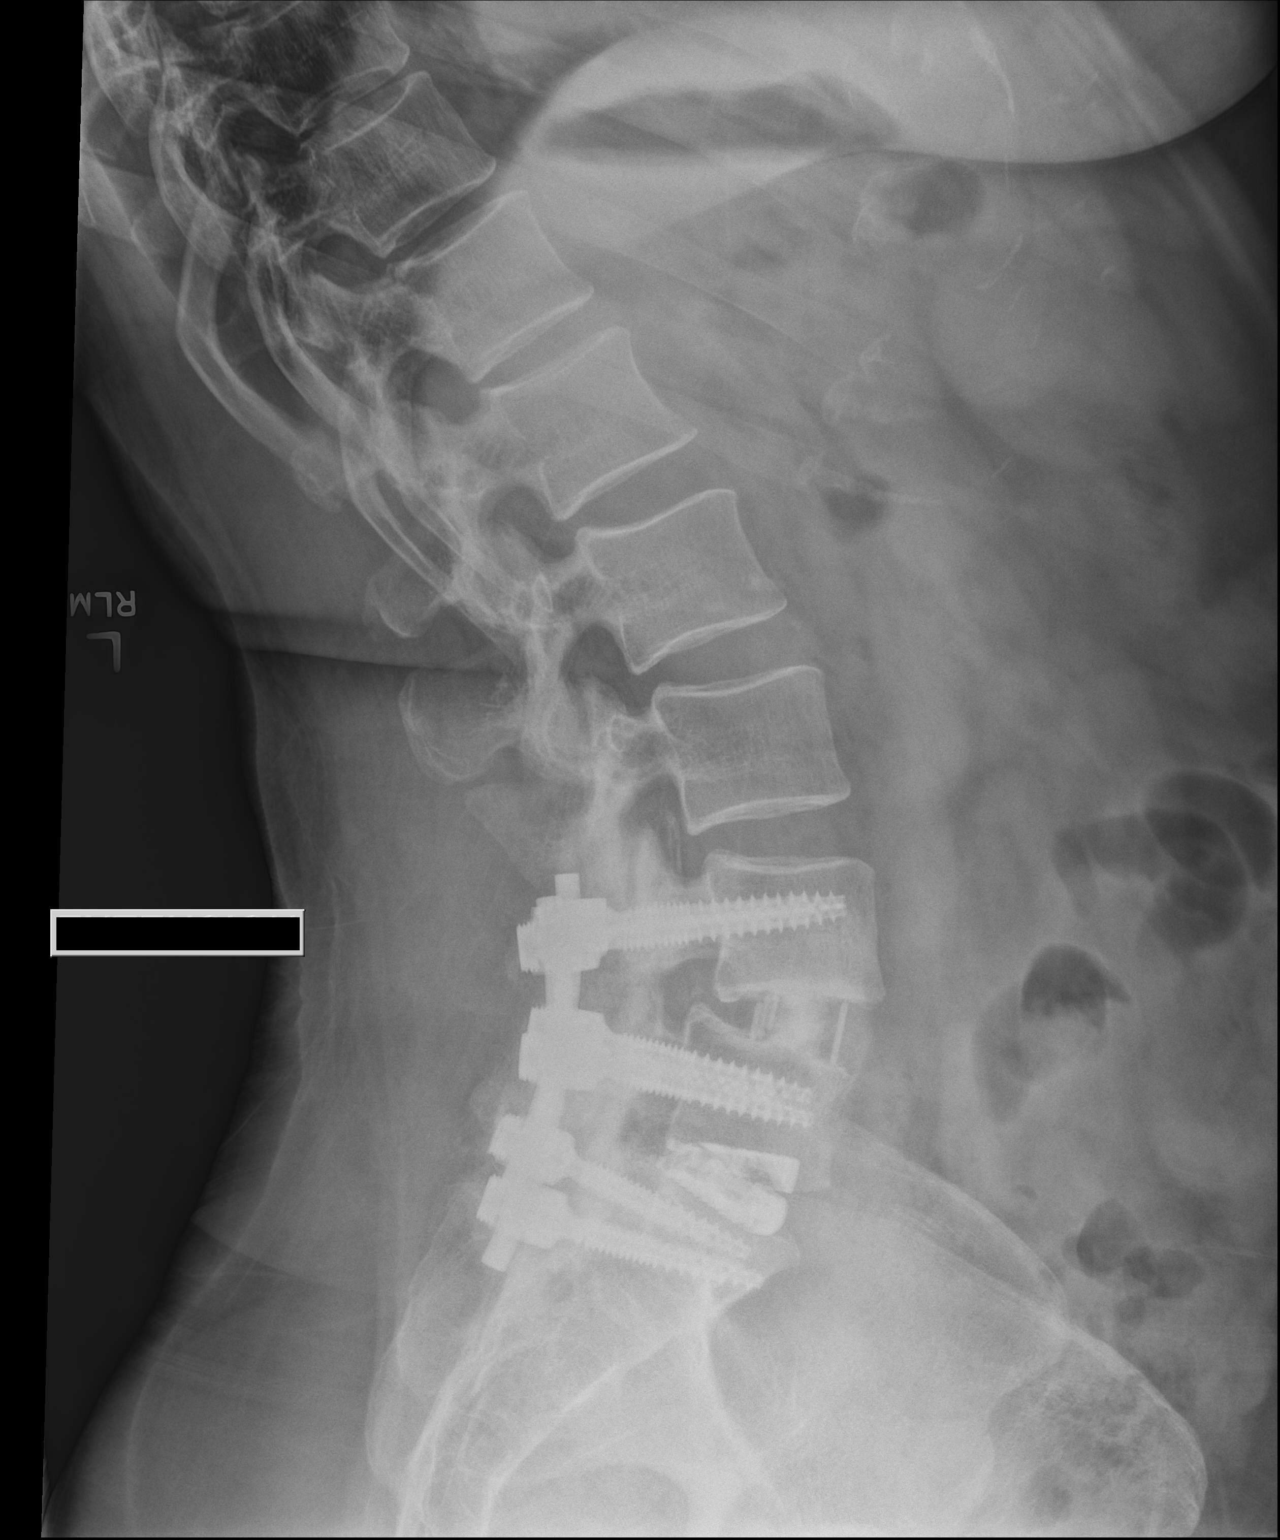

[lateral (2 of 2)]
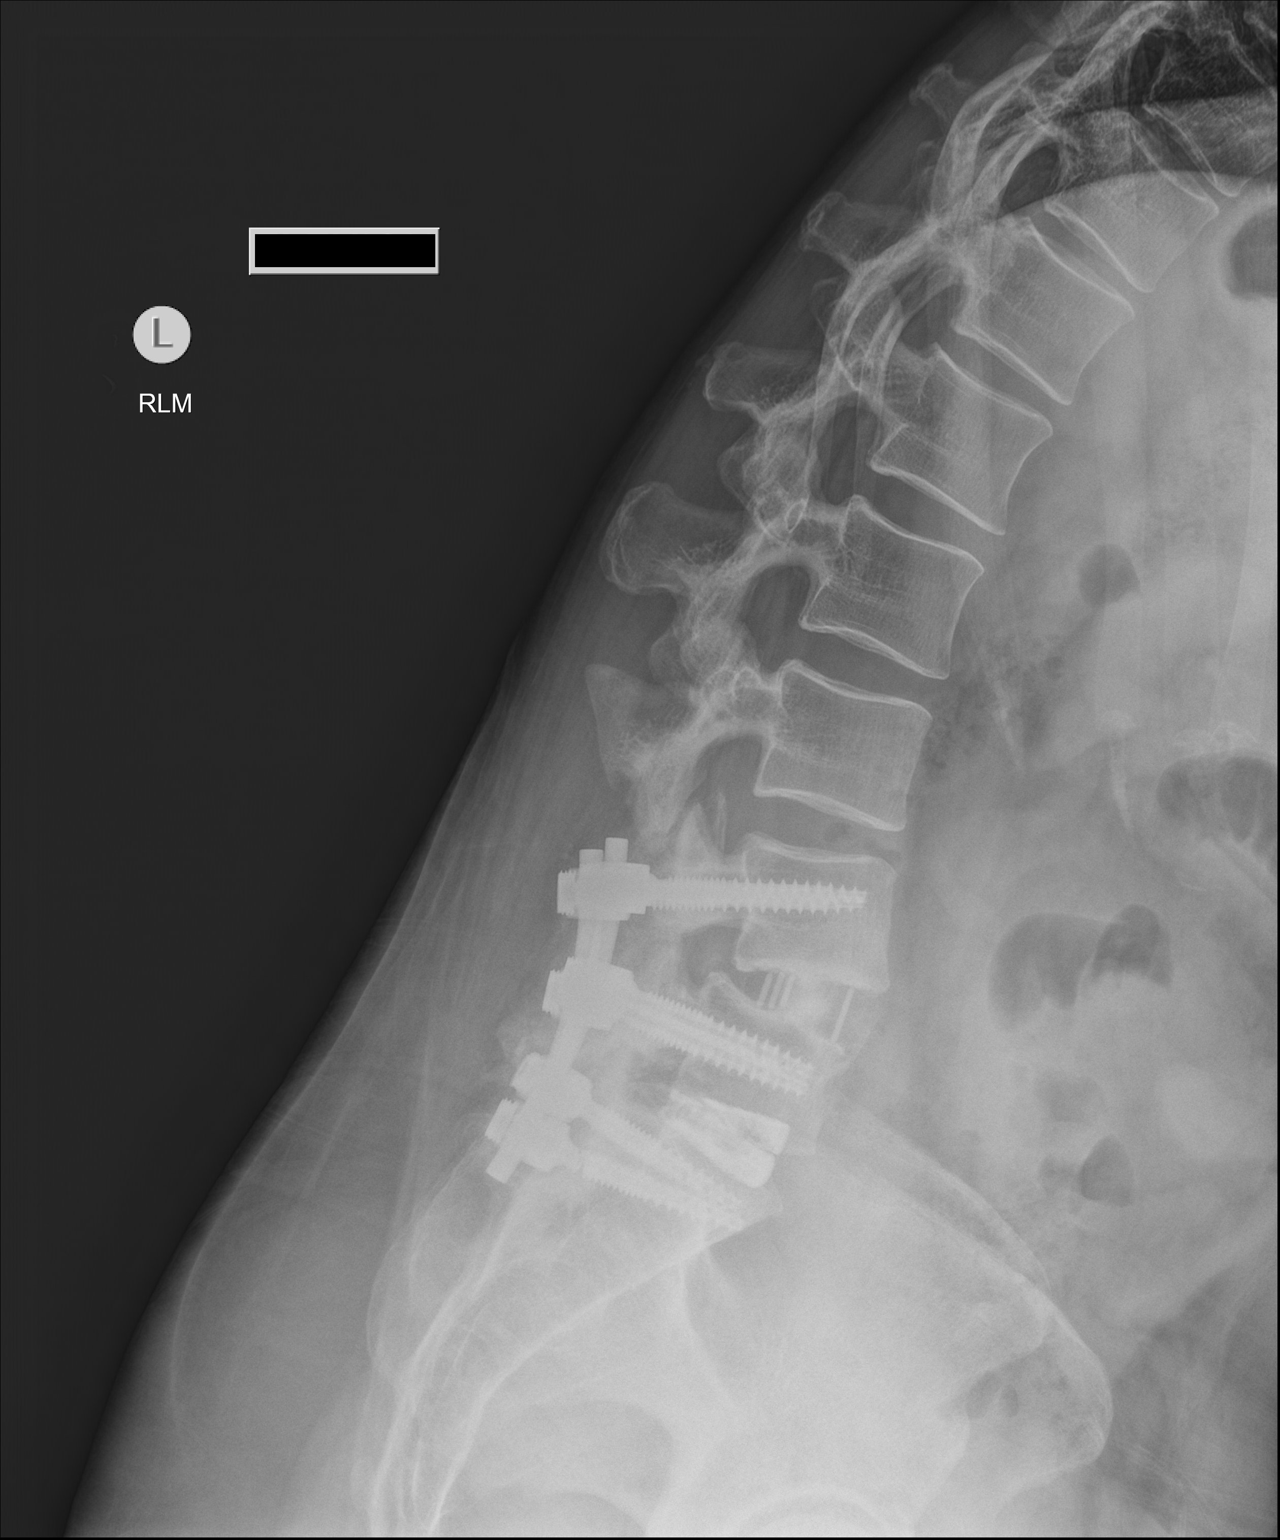

[2 of 2 positions shown; findings below may reference images not displayed]

FINDINGS: 5 lumbar type vertebral bodies. Anterior posterior spinal fusion with 
bilateral pedicle screws L4, L5, and S1 and longitudinally connecting rods. 
Interbody graft material L4-L5 and L5-S1. There may be early osseous bridging 
across L4-L5 disc space without osseous bridging across L5-S1. No hardware 
fracture. 6 mm anterolisthesis L4 on L5 is without dynamic instability. There is 
stable appearing minimal anterior wedging L5. Other vertebral bodies are 
preserved in height. No evidence of acute fracture. Disc space height preserved.
IMPRESSION: Postsurgical and degenerative changes as detailed above. Stable 6 mm 
anterolisthesis L4 on L5 without dynamic instability.

## 2023-08-11 IMAGING — MR MRI LUMBAR SPINE WITHOUT CONTRAST
6 of 8 series · 11 of 48 positions shown · IV contrast (gadolinium)
Comparison: MRI lumbar spine from August 22, 2022.

________________________________________________________________________________________________ 
MRI LUMBAR SPINE WITHOUT CONTRAST, 08/11/2023 [DATE]: 
CLINICAL INDICATION: Other specified postprocedural states . Ablation without 
resolution of symptoms. History of surgery.
TECHNIQUE: Multiplanar, multiecho position MR images of the lumbar spine were 
performed without intravenous gadolinium enhancement. Patient was scanned on a 
1.5T magnet

[Series 101: survey · axial · 10.0mm · 1.25mm/px · 1 of 10 slices shown]
[im 1/10]
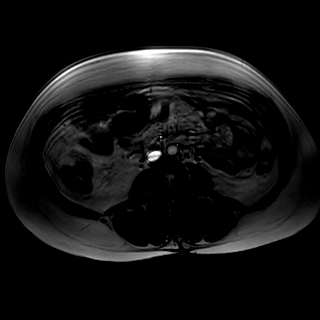

[Series 201: t2w_cor-surv · coronal · 6.0mm · 0.62mm/px · 1 of 10 slices shown]
[im 1/10]
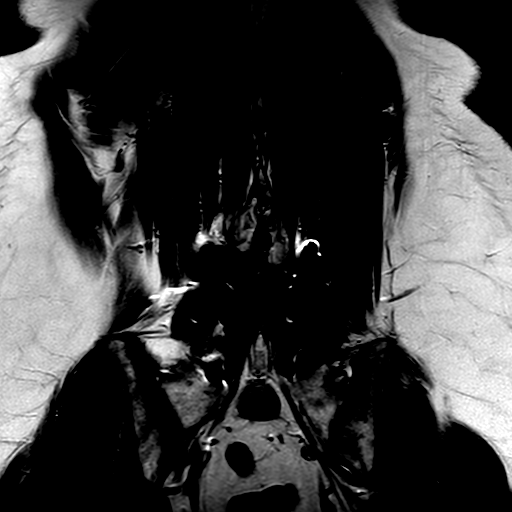

[Series 301: t1_tse_sag · sagittal · 4.0mm · 0.48mm/px · 2 of 19 slices shown]
[im 1/19]
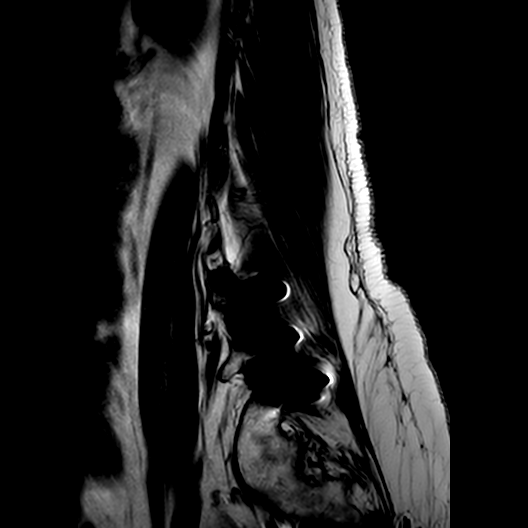
[im 19/19]
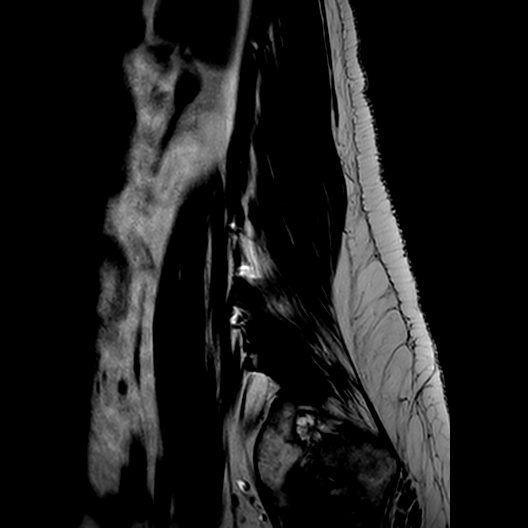

[Series 402: (id)_mdixon_tse · sagittal · 4.0mm · 0.40mm/px · 3 of 19 slices shown]
[im 1/19]
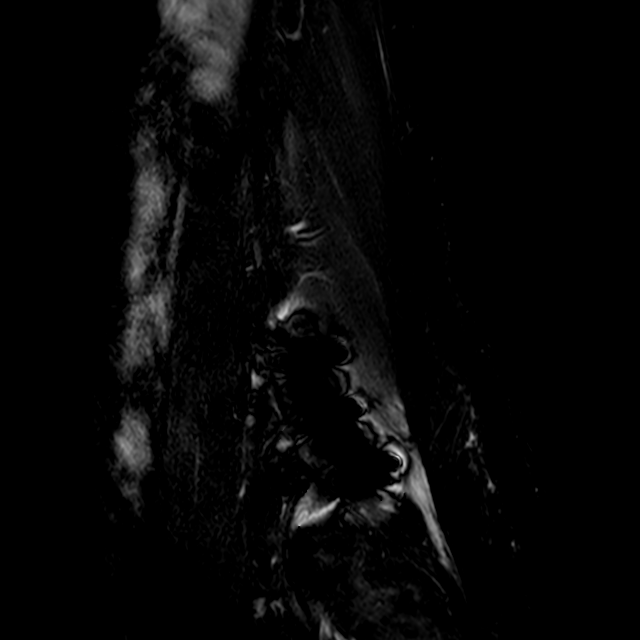
[im 10/19]
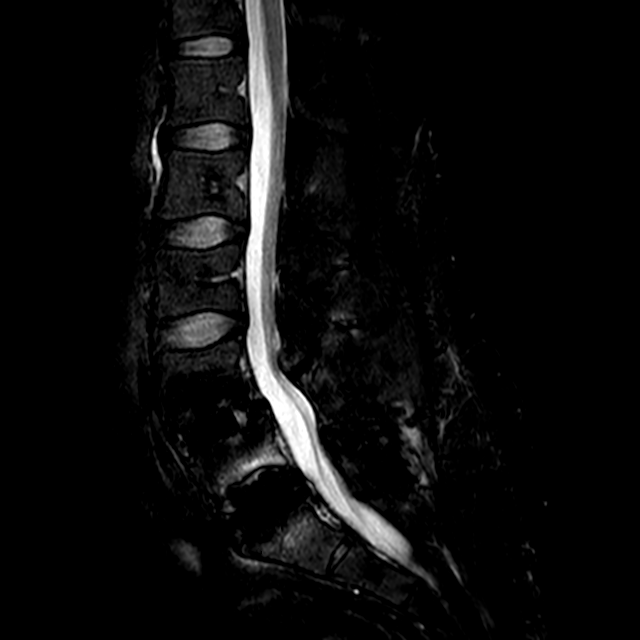
[im 19/19]
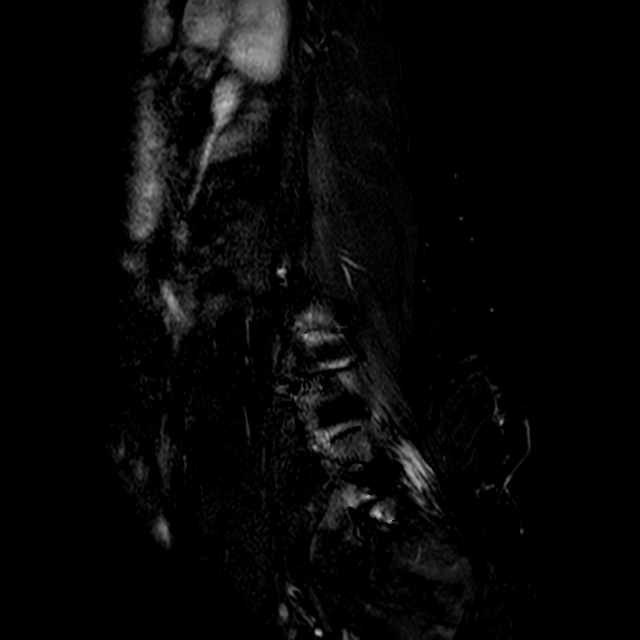

[Series 403: st2w_mdixon_tse · sagittal · 4.0mm · 0.40mm/px · 3 of 19 slices shown]
[im 1/19]
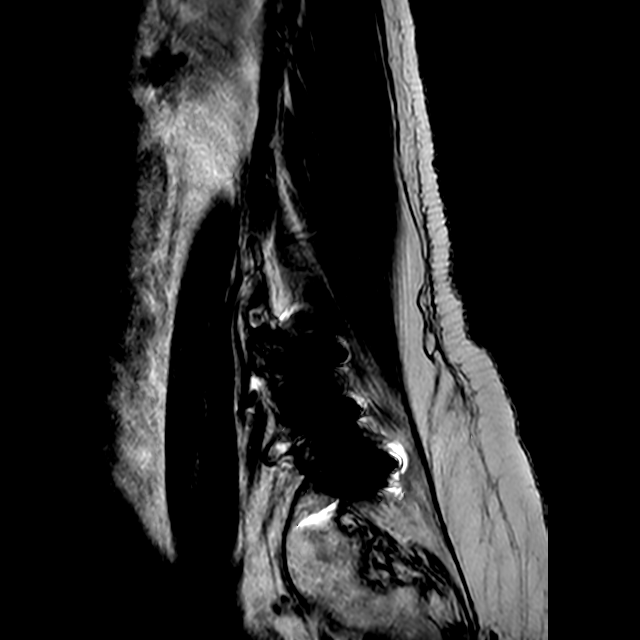
[im 10/19]
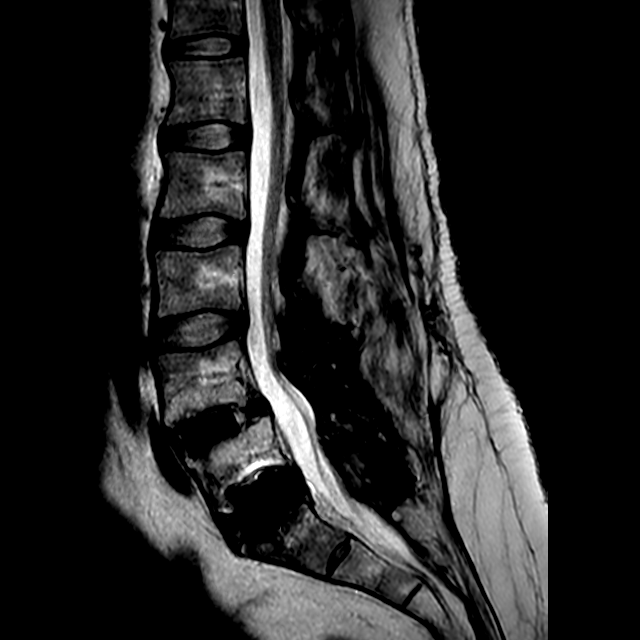
[im 19/19]
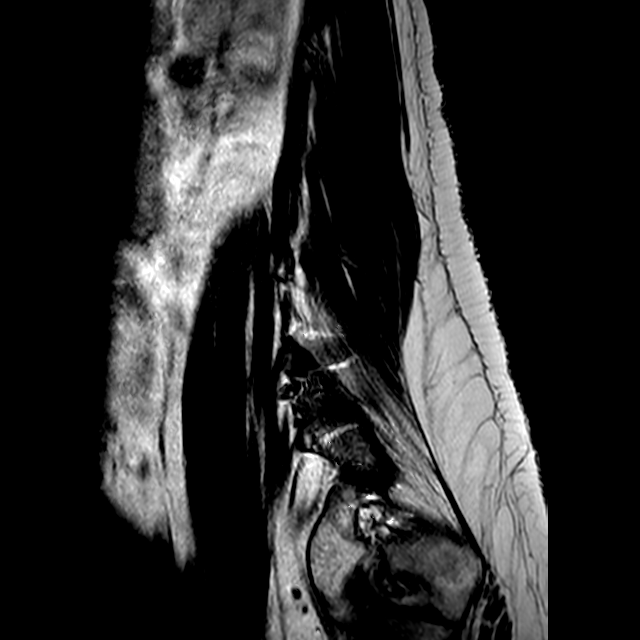

[Series 502: (id) view_ax mpr · axial · 1.0mm · 0.25mm/px · 1 of 129 slices shown]
[im 8/129]
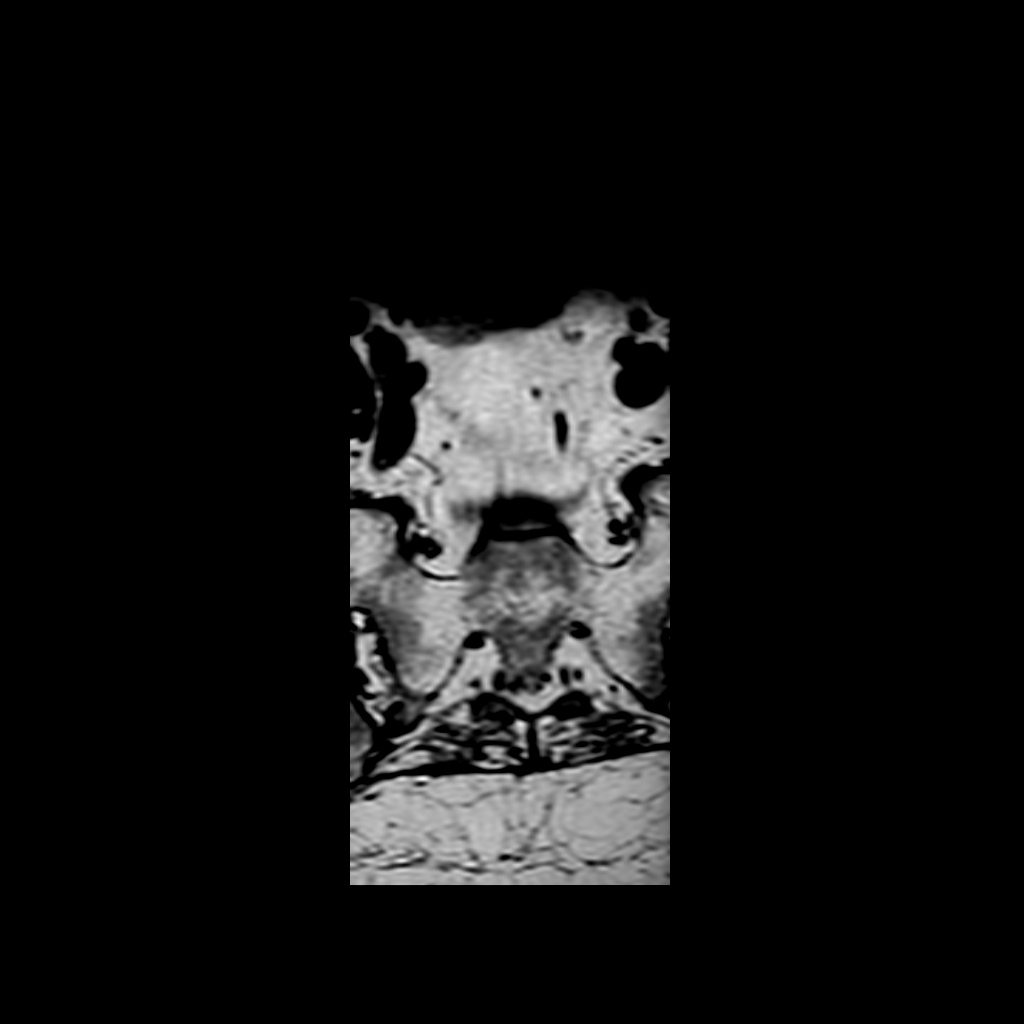

[11 of 48 positions shown; findings below may reference images not displayed]

FINDINGS: -------------------------------------------------------------------------------- 
------ 
GENERAL: 
POSTSURGICAL: Status post lumbar hardware fusion with pedicles and posterior 
fusion rods from L4 through S1. Discectomy with intervertebral spacer placement 
L4-L5 and L5-S1 levels.     
ALIGNMENT: Mild retrolisthesis of L2 on L3, L3 on L4. Mild anterolisthesis of L4 
on L5. 
VERTEBRAL BODY HEIGHT: Normal.  
MARROW SIGNAL: No focal suspect signal abnormality. 
CORD SIGNAL: Normal distal spinal cord and cauda equina.  Conus terminates at 
L1-L2. 
ADDITIONAL FINDINGS: None. 
Modic I-II: None. 
Ligamentum Flavum > 2.5 mm: All nonsurgical levels. 
-------------------------------------------------------------------------------- 
------ 
SEGMENTAL: 
T12-L1: No significant central canal narrowing.  No significant right neural 
foraminal narrowing. No significant left neural foraminal narrowing.  
L1-L2: No significant central canal narrowing.  No significant right neural 
foraminal narrowing. No significant left neural foraminal narrowing.  
L2-L3: No significant central canal narrowing.  No significant right neural 
foraminal narrowing. No significant left neural foraminal narrowing.  
L3-L4: Bilateral facet hypertrophy, no significant central canal narrowing.  No 
significant right neural foraminal narrowing. No significant left neural 
foraminal narrowing.  
L4-L5: Laminectomy and fusion. No significant central canal narrowing. Right 
foraminal residual disc protrusion. Moderate right neural foraminal narrowing. 
No significant left neural foraminal narrowing.  
L5-S1: Laminectomy and fusion. No significant central canal narrowing.  No 
significant right neural foraminal narrowing. No significant left neural 
foraminal narrowing.  
-------------------------------------------------------------------------------- 
------ 
CHANGES FROM PRIOR: 
No significant change compared to prior MRI. 
-------------------------------------------------------------------------------- 
------
IMPRESSION: 1.  Discogenic/degenerative and postsurgical changes as above. 
2.  Stable moderate right neural foraminal narrowing at L4-L5.

## 2024-03-08 ENCOUNTER — Telehealth (INDEPENDENT_AMBULATORY_CARE_PROVIDER_SITE_OTHER): Payer: Self-pay | Admitting: Orthopaedic Surgery of the Spine

## 2024-03-08 NOTE — Telephone Encounter (Signed)
 Name of caller:Haley Barrett   Relationship to patient:Self  Interpreter needed:No    Reason for call: Appointment review    Pt is asking if she can schedule a MCVV with Dr. JENEANE while she is in the state of [place]  to discuss her lumbar spine. Had prev sx with Dr. JENEANE 11/02/19 and was advised recently with a provider she's seeing that another surgery is needed and she wanted to discuss with him    [facility] did advise for MCVV patient must be in [place]  but patient was adamant for message to be sent.please advise             Best number to be reached at: [phone]            Route to RN pool if requesting clinical advice, it can take 24-48 hours turnaround time, route to MA if administrative advice it can take 3-5 business days turn around time.     If an action is needed from the call center please route to P Ortho Scheduling Pool (not directly to cc staff)

## 2024-03-14 ENCOUNTER — Encounter (INDEPENDENT_AMBULATORY_CARE_PROVIDER_SITE_OTHER): Payer: Self-pay | Admitting: Hospital

## 2024-03-24 ENCOUNTER — Encounter (INDEPENDENT_AMBULATORY_CARE_PROVIDER_SITE_OTHER): Payer: Self-pay | Admitting: Orthopaedic Surgery of the Spine

## 2024-03-31 NOTE — Telephone Encounter (Signed)
 Pt is calling back and asking to schedule appt with Dr. JENEANE via MCVV, as per TE on 03/08/2024 pt was told NO as she would need to be in  . Pt again sent a message [phone] asking same question and was told to call back, in message does not give the ok to schedule MCVV, seeking clarification. Please assist and follow up with pt         Phone: [phone]

## 2024-04-20 ENCOUNTER — Telehealth (INDEPENDENT_AMBULATORY_CARE_PROVIDER_SITE_OTHER): Payer: Self-pay | Admitting: Orthopaedic Surgery of the Spine

## 2024-04-20 NOTE — Telephone Encounter (Signed)
 Name of caller: Lucie   Relationship to patient:Other; Please add relationship  Interpreter needed:No    Reason for call: Other; please add reason of call Lucie calling in from [facility] in [place]  states Dr. Leontine wants to schedule a peer to peer with Dr. JENEANE to discuss the continuation of care. Per Lucie MD seen Pt today so she wants to further discuss clinical info. Pt will be following up w/ Dr. Nguyen in 6 weeks.       Three hr time difference     LOV Date & Plan: 02/18/22      Expand All Collapse All  ORTHOPAEDIC SPINE SURGERY      Visit Type: Office Visit     Reason For Visit: No chief complaint on file.     Surgery Date: 11/03/2019     Procedure: ROI and exploration of fusion L4-5      she   History Of Present Illness: 43 year old female returns to spine clinic for follow up evaluation of complaints of low back pain. She is seen again by video telehealth. She continues to have ongoing significant low back pain which she states continues to limit her activities extensively. She is being managed by a spine surgeon in [place]  and she states that she had undergone revision spinal fusion several months ago with fusion to sacrum.  She states that she continues to have severe low back pain and is intermittently tearful during todays visit again. . She has ongoing progression of low back pain which is severely activity limiting. She continues with PT and HEP but this has been difficult for her.  Patient reports no changes in bowel and bladder function. Patient returns for follow up.     Allergies:   Allergies        Allergies   Allergen Reactions    Sulfa Drugs Nausea Only and Anxiety    Tramadol  Nausea and Vomiting            Medications:   Current Medications        Current Outpatient Medications   Medication Sig    ALPRAZolam  (XANAX ) 0.25 MG tablet take 1 tablet by mouth once daily if needed    docusate sodium  (COLACE) 250 MG capsule Take 1 capsule (250 mg) by mouth at bedtime.     gabapentin  (NEURONTIN ) 300 MG capsule Take 300 mg by mouth 3 times daily.    lidocaine  (ASPERCREME) 4 % patch Apply 2 patches topically every 24 hours as needed (pain). Leave patch on for 12 hours, then remove for 12 hours.    oxyCODONE  (ROXICODONE ) 10 MG tablet Take 1 tablet (10 mg) by mouth every 3 hours as needed for Moderate Pain (Pain Score 4-6).    prochlorperazine  (COMPAZINE ) 5 MG tablet Take 1 tablet (5 mg) by mouth every 6 hours as needed for Nausea/Vomiting.      No current facility-administered medications for this visit.             Review of Systems:  CONSTITUTIONAL: No unexpected weight loss, fevers, chills, or anorexia. SKIN: Noncontributory. GENITOURINARY: Symptoms as noted above in HPI. NEUROLOGIC: Symptoms as noted above in HPI. MUSCULOSKELETAL: Symptoms as noted above in HPI.     Physical Examination:  There were no vitals taken for this visit.         Deferred due to video visit.  PATHOLOGIC REFLEXES     IMAGING STUDIES:   Plain radiographs from outside facility uploaded and shows evidence of  fusion with retained instrumentation L4 thru sacrum, no evidence of loosening or failure.     OTHER MEDICAL RECORDS/IMAGING STUDIES REVIEWED: None     IMPRESSION: Low back pain. s/p lumbar fusion L4 thru sacrum.     PLAN/DISCUSSION: Reviewed clinical history as well as exam results and imaging findings with patient in detail. She has undergone revision lumbar fusion in [place]  and is currently being closely followed by outside surgeon. Reviewed imaging which shows instrumentation in tact without any evidence of loosening or failure. She continues to have significant pain and is at times again tearful during video visit. It is unclear what the source of her pain is. Recommended ongoing pain management and PT regimen. She inquired about additional surgeons in the area that she could seek out for possible additional surgical address, but I am not familiar with any in that region. Continued to encourage ongoing  pain management options to establish a source and treatment of her pain complaints. All questions were answered and Haley Barrett understood and was satisfied with this plan. Over 30 minutes were spent in discussion and formulation of plan in addition to review of outside records and imaging and coordination of telecommunication visit and difficulty with connectivity issues.     FOLLOWUP: Return to clinic: prn. The patient is encouraged to call us  with any questions or problems in the interim. Our contact numbers were given to the patient.               Best number to be reached at: Personal cell [phone]            Route to RN pool if requesting clinical advice, it can take 24-48 hours turnaround time, route to MA if administrative advice it can take 3-5 business days turn around time.     If an action is needed from the call center please route to P Ortho Scheduling Pool (not directly to cc staff)

## 2024-04-20 NOTE — Telephone Encounter (Signed)
 Pt is calling regarding below TE regarding Peer to Peer.        Dr Leontine Personal cell [phone]   Clinic [phone] ex [phone] for Samantha       Please assist, thank you

## 2024-04-24 NOTE — Telephone Encounter (Signed)
 I think that generally the ortho Dr. From [place]  can page dr. JENEANE to discuss the case. He can leave his number and we can pass on to Dr. JENEANE to reach him.

## 2024-04-25 NOTE — Telephone Encounter (Signed)
 Texted Md's number to Dr. JENEANE

## 2024-05-04 NOTE — Telephone Encounter (Signed)
Patient is calling to check status of peer to peer.      Please see below message.

## 2024-05-09 NOTE — Telephone Encounter (Signed)
 Tc to [facility] at Dr.Nguyen's office, left message asking if Dr.Z and Dr. Leontine ever connected over the phone.

## 2024-05-12 ENCOUNTER — Encounter (HOSPITAL_BASED_OUTPATIENT_CLINIC_OR_DEPARTMENT_OTHER): Payer: Self-pay | Admitting: Neurological Surgery

## 2024-05-12 NOTE — Telephone Encounter (Signed)
 ERROR

## 2024-05-19 ENCOUNTER — Telehealth (HOSPITAL_BASED_OUTPATIENT_CLINIC_OR_DEPARTMENT_OTHER): Payer: Self-pay | Admitting: Neurological Surgery

## 2024-05-19 NOTE — Telephone Encounter (Signed)
 Pt is calling requesting to re- establish with Dr. Elene since pt was previously seen, LOV 08/08/2019. Pt stated her condition has changed and has had surgery in [place]  since. Pt is requesting a telehealth consult and prefers MD. Pt stated she will request imaging report from [place]  and notes be sent via fax or MyChart.       Please note and advise.

## 2024-05-24 ENCOUNTER — Other Ambulatory Visit: Payer: Self-pay

## 2024-05-25 ENCOUNTER — Other Ambulatory Visit: Payer: Self-pay

## 2024-05-26 ENCOUNTER — Encounter (HOSPITAL_BASED_OUTPATIENT_CLINIC_OR_DEPARTMENT_OTHER): Payer: Self-pay | Admitting: Neurological Surgery

## 2024-05-26 DIAGNOSIS — Z981 Arthrodesis status: Secondary | ICD-10-CM

## 2024-05-26 NOTE — Telephone Encounter (Signed)
 Pt called back, images have uploaded and she scheduled the next available appt for 0/24/2026. She will continue to send over new imaging and reports between now and then.

## 2024-06-07 ENCOUNTER — Encounter (INDEPENDENT_AMBULATORY_CARE_PROVIDER_SITE_OTHER): Payer: Self-pay | Admitting: Orthopaedic Surgery of the Spine

## 2024-06-07 ENCOUNTER — Ambulatory Visit (INDEPENDENT_AMBULATORY_CARE_PROVIDER_SITE_OTHER): Admitting: Orthopaedic Surgery of the Spine

## 2024-06-07 ENCOUNTER — Encounter (INDEPENDENT_AMBULATORY_CARE_PROVIDER_SITE_OTHER): Payer: Self-pay | Admitting: Hospital

## 2024-06-07 DIAGNOSIS — M48061 Spinal stenosis, lumbar region without neurogenic claudication: Secondary | ICD-10-CM

## 2024-06-07 DIAGNOSIS — Z981 Arthrodesis status: Secondary | ICD-10-CM

## 2024-06-07 DIAGNOSIS — M4316 Spondylolisthesis, lumbar region: Secondary | ICD-10-CM

## 2024-06-07 DIAGNOSIS — M4310 Spondylolisthesis, site unspecified: Secondary | ICD-10-CM

## 2024-06-07 DIAGNOSIS — M47816 Spondylosis without myelopathy or radiculopathy, lumbar region: Secondary | ICD-10-CM

## 2024-06-07 DIAGNOSIS — M5116 Intervertebral disc disorders with radiculopathy, lumbar region: Secondary | ICD-10-CM

## 2024-06-07 DIAGNOSIS — G8929 Other chronic pain: Secondary | ICD-10-CM

## 2024-06-07 NOTE — Progress Notes (Signed)
 ORTHOPAEDIC SPINE SURGERY     Visit Type: Office Visit    Reason For Visit: Surgery Consult    Surgery Date: 11/03/2019    Procedure: ROI and exploration of fusion L4-5    History Of Present Illness: 42 year old female returns to spine clinic for follow up evaluation of complaints of low back pain. She has a complicated history of orginally having a TOPS procedure preformed at L4-L5 followed by revision surgery by Dr. Zlomislic for removal of TOPS and Interbody fusion with PSIF. She then underwent a TLIF by an outside surgeon at L5-S1 and continues to have low back pain above her surgical site along with dysthesia in her right anterior thigh as well as some numbness and tingling in bilateral lower extremities.. She continues to have ongoing significant low back pain which she states continues to limit her activities extensively. She is being managed by a spine surgeon in [place]  whom is advising that her L5-S1 level is showing signs of failure. .  She states that she continues to have severe low back pain and is intermittently tearful during todays visit again. . She has ongoing progression of low back pain which is severely activity limiting. She continues with PT and HEP but this has been difficult for her.  Patient reports no changes in bowel and bladder function. Patient returns for follow up having exhausted all conservative management.     Allergies:   Allergies   Allergen Reactions    Sulfa Drugs Nausea Only and Anxiety    Tramadol  Nausea and Vomiting       Medications:    ALPRAZolam  take 1 tablet by mouth once daily if needed      docusate sodium  Take 1 capsule (250 mg) by mouth at bedtime. 60 capsule 0    gabapentin  Take 300 mg by mouth 3 times daily.      lidocaine  Apply 2 patches topically every 24 hours as needed (pain). Leave patch on for 12 hours, then remove for 12 hours. 30 patch 0    oxyCODONE  Take 1 tablet (10 mg) by mouth every 3 hours as needed for Moderate Pain (Pain Score 4-6). 120 tablet 0     prochlorperazine  Take 1 tablet (5 mg) by mouth every 6 hours as needed for Nausea/Vomiting. 30 tablet 11         Review of Systems:  CONSTITUTIONAL: No unexpected weight loss, fevers, chills, or anorexia. SKIN: Noncontributory. GENITOURINARY: Symptoms as noted above in HPI. NEUROLOGIC: Symptoms as noted above in HPI. MUSCULOSKELETAL: Symptoms as noted above in HPI.    Physical Examination:   General:        Incisions are well healed without signs of infection      Bilateral Lower Extremity   General:        Atraumatic, normal muscle tone, no atrophy, appropriate active range of motion   Motor:       Right           Left        Iliopsoas (L2/3)                     5/5           5/5         Quadriceps (L4)                    5/5           5/5  Tibialis Anterior (L4/5)           5/5           5/5        EHL (L5)                               5/5           5/5        Gluteus Medius (L5)             5/5           5/5        GSC (S1)                              5/5           5/5        FHL (S2)                               5/5           5/5   Reflexes:        Patellar (L3/4)                        2               2        Achilles (S1)                           2              2   Sensation:        SILT in L2-S1 distributions.   Special Tests:        Downgoing plantar response. No clonus.   Vascular:        DP 2+. Capillary refill <2s.      PATHOLOGIC REFLEXES: none    OTHER MEDICAL RECORDS/IMAGING STUDIES REVIEWED:  We reviewed all of the patient's uploaded imaging including multiple lumbar spine x-rays, lumbar spine CT scan and most recent lumbar spine MRI. There is noticed subsidence of her TLIF cage at L5-S1 without significant encroachment into her spinal canal. There is also no signs of central canal, lateral recess or foramental stenosis from L1-S1 on  static imaging. Dynamic imaging of her lumbar spine does show retrolisthesis at L3-L4 that reduces with supine imaging.       Assessment/Plan:  43 year old  female returns to spine clinic for follow up evaluation of complaints of low back pain. She has a complicated history of orginally having a TOPS procedure preformed at L4-L5 followed by revision surgery by Dr. Zlomislic for removal of TOPS and Interbody fusion with PSIF. She then underwent a TLIF by an outside surgeon at L5-S1 and continues to have low back pain above her surgical site along with dysthesia in her right anterior thigh as well as some numbness and tingling in bilateral lower extremities. At this time she presents with subsidence of her L5-S1 TLIF which we discussed with her could potential heal in time but hard to say off of current imaging without following over time. We discussed that we think a large portion of her pain is a result of her retrolisthesis at the L3-L4  level which could have been a result of overdistention and destabilization from her original TOPS procedure. We had an in depth discussion that at this time this is the only reasonable target for any further surgical intervention but that it is not clear if this is the sole generator of her back pain and it is very likely not. We discussed that any future spinal fusion procedure to address this level could result in either resolution of her symptoms but also could result in continued low back pain that could eventual lead to further future fusion procedures up her spine. At this time we would like to obtain further data with a SPEC CT of her lumbar spine to evaluate for activity at her L3-L4 endplates to see if there is inflammatory activity that would correlate with her back pain at this site. We will also call her Spine surgeon in [place]  to collaborate and coordinate her treatment and discussion our recommendations.    We ended the clinic encounter cautioning the patient that if she were to continue down the path of surgical intervention that if it did not relieve her pain at L3-L4 it would likely lead to continued spinal fusion  procedure that could eventually lead to a T10 to pelvis PSIF. The patient verbalize understanding and would like to continue with plan for continued imaging and continued exploration of surgical options.        FOLLOWUP: Return to clinic: prn. The patient is encouraged to call us  with any questions or problems in the interim. Our contact numbers were given to the patient.

## 2024-06-12 ENCOUNTER — Encounter (INDEPENDENT_AMBULATORY_CARE_PROVIDER_SITE_OTHER): Payer: Self-pay | Admitting: Orthopaedic Surgery of the Spine

## 2024-06-13 NOTE — Telephone Encounter (Signed)
 IMPRESSION: Low back pain. s/p lumbar fusion L4 thru sacrum.     PLAN/DISCUSSION: Reviewed clinical history as well as exam results and imaging findings with patient in detail. She has undergone revision lumbar fusion in [place]  and is currently being closely followed by outside surgeon. Reviewed imaging which shows instrumentation in tact without any evidence of loosening or failure. She continues to have significant pain and is at times again tearful during video visit. It is unclear what the source of her pain is. Recommended ongoing pain management and PT regimen. She inquired about additional surgeons in the area that she could seek out for possible additional surgical address, but I am not familiar with any in that region. Continued to encourage ongoing pain management options to establish a source and treatment of her pain complaints. All questions were answered and Haley Barrett understood and was satisfied with this plan. Over 30 minutes were spent in discussion and formulation of plan in addition to review of outside records and imaging and coordination of telecommunication visit and difficulty with connectivity issues.     FOLLOWUP: Return to clinic: prn. The patient is encouraged to call us  with any questions or problems in the interim. Our contact numbers were given to the patient.

## 2024-06-13 NOTE — Telephone Encounter (Signed)
 Spect CT is the only thing he mentioned in his note. Has she completed that?

## 2024-06-22 NOTE — Progress Notes (Signed)
 Attending Note:    Subjective:  I reviewed the history.  Patient interviewed and examined.  History of present illness (HPI):  Surgery Consult     Review of Systems (ROS): As per the fellow's note.  Past Medical, Family, Social History:  As per the fellow's  note.    Objective:   I have examined the patient and I concur with the fellow's exam.  Assessment and plan reviewed with the fellow. I agree with the fellow's plan as documented.  See the fellow's note for further details.

## 2024-07-03 ENCOUNTER — Telehealth (HOSPITAL_BASED_OUTPATIENT_CLINIC_OR_DEPARTMENT_OTHER): Payer: Self-pay | Admitting: Orthopaedic Surgery of the Spine

## 2024-07-03 ENCOUNTER — Encounter (HOSPITAL_BASED_OUTPATIENT_CLINIC_OR_DEPARTMENT_OTHER): Payer: Self-pay | Admitting: Hospital

## 2024-07-03 NOTE — Telephone Encounter (Addendum)
 Name of caller: Haley Barrett  Relationship to patient:Self  Interpreter needed:No    Reason for call: Other; please add reason of call  Pt calling in today to see if she can make a f/u appt with Dr. JENEANE via [facility], to review the CT of her L-Spine. Pt had it done at outside facility and could not recall the exact name of it. Pt scheduled a f/u appt with Dr. JENEANE on 09/11/24 ([facility]) @9 :20am. Pt states she'll work on getting imaging uploaded via [facility] or [facility]. She will also get imaging report faxed to [facility] fax [phone].    Please f/u and advise if OK to convert appt to [facility].      LOV Date & Plan:  06/07/24  Assessment/Plan:  43 year old female returns to spine clinic for follow up evaluation of complaints of low back pain. She has a complicated history of orginally having a TOPS procedure preformed at L4-L5 followed by revision surgery by Dr. Zlomislic for removal of TOPS and Interbody fusion with PSIF. She then underwent a TLIF by an outside surgeon at L5-S1 and continues to have low back pain above her surgical site along with dysthesia in her right anterior thigh as well as some numbness and tingling in bilateral lower extremities. At this time she presents with subsidence of her L5-S1 TLIF which we discussed with her could potential heal in time but hard to say off of current imaging without following over time. We discussed that we think a large portion of her pain is a result of her retrolisthesis at the L3-L4 level which could have been a result of overdistention and destabilization from her original TOPS procedure. We had an in depth discussion that at this time this is the only reasonable target for any further surgical intervention but that it is not clear if this is the sole generator of her back pain and it is very likely not. We discussed that any future spinal fusion procedure to address this level could result in either resolution of her symptoms but also could result in continued low back pain that could  eventual lead to further future fusion procedures up her spine. At this time we would like to obtain further data with a SPEC CT of her lumbar spine to evaluate for activity at her L3-L4 endplates to see if there is inflammatory activity that would correlate with her back pain at this site. We will also call her Spine surgeon in [place]  to collaborate and coordinate her treatment and discussion our recommendations.     We ended the clinic encounter cautioning the patient that if she were to continue down the path of surgical intervention that if it did not relieve her pain at L3-L4 it would likely lead to continued spinal fusion procedure that could eventually lead to a T10 to pelvis PSIF. The patient verbalize understanding and would like to continue with plan for continued imaging and continued exploration of surgical options.           Best number to be reached at:    [phone]      Route to RN pool if requesting clinical advice, it can take 24-48 hours turnaround time, route to MA if administrative advice it can take 3-5 business days turn around time.     If an action is needed from the call center please route to [facility] Scheduling Pool (not directly to cc staff)

## 2024-07-06 ENCOUNTER — Encounter (HOSPITAL_BASED_OUTPATIENT_CLINIC_OR_DEPARTMENT_OTHER): Payer: Self-pay | Admitting: Orthopaedic Surgery of the Spine

## 2024-08-09 ENCOUNTER — Encounter (HOSPITAL_BASED_OUTPATIENT_CLINIC_OR_DEPARTMENT_OTHER): Payer: Self-pay | Admitting: Hospital

## 2024-08-14 ENCOUNTER — Ambulatory Visit: Admitting: Orthopaedic Surgery of the Spine

## 2024-08-16 ENCOUNTER — Encounter (HOSPITAL_BASED_OUTPATIENT_CLINIC_OR_DEPARTMENT_OTHER): Payer: Self-pay | Admitting: Orthopaedic Surgery of the Spine

## 2024-08-16 ENCOUNTER — Telehealth (HOSPITAL_BASED_OUTPATIENT_CLINIC_OR_DEPARTMENT_OTHER): Payer: Self-pay

## 2024-08-16 NOTE — Telephone Encounter (Signed)
[facility] instructions.

## 2024-08-17 ENCOUNTER — Other Ambulatory Visit: Payer: Self-pay

## 2024-08-18 ENCOUNTER — Encounter (HOSPITAL_BASED_OUTPATIENT_CLINIC_OR_DEPARTMENT_OTHER): Payer: Self-pay | Admitting: Orthopaedic Surgery of the Spine

## 2024-08-22 ENCOUNTER — Telehealth (HOSPITAL_BASED_OUTPATIENT_CLINIC_OR_DEPARTMENT_OTHER): Payer: Self-pay | Admitting: Orthopaedic Surgery of the Spine

## 2024-08-22 NOTE — Telephone Encounter (Signed)
 Name of caller: Haley Barrett  Relationship to patient:Self  Interpreter needed:No    Reason for call: Appointment review  Pt is calling in today to f/u on [facility] she sent on 12/12 regarding her other CT/MRI imaging she needed for Dr. JENEANE. She sent the imaging report via [facility] Pt would like to know if she can schedule a f/u appt with Dr. JENEANE to have them reviewed but there are now scheduling instructions/notes from LOV (08/14/24).     Please advise if ok to schedule.      LOV Date & Plan:  08/14/24 - no notes        Best number to be reached at:    [phone]      Route to RN pool if requesting clinical advice, it can take 24-48 hours turnaround time, route to MA if administrative advice it can take 3-5 business days turn around time.     If an action is needed from the call center please route to [facility] Scheduling Pool (not directly to cc staff)

## 2024-08-23 NOTE — Telephone Encounter (Signed)
 Returned call to [name], data processing manager.     Gave her [facility]  [phone], she should contact them to confirm which images are in the system and ready for Dr.Zlomislic to view.

## 2024-08-24 ENCOUNTER — Encounter (HOSPITAL_BASED_OUTPATIENT_CLINIC_OR_DEPARTMENT_OTHER): Payer: Self-pay | Admitting: Hospital

## 2024-08-24 ENCOUNTER — Other Ambulatory Visit: Payer: Self-pay

## 2024-09-01 ENCOUNTER — Telehealth (INDEPENDENT_AMBULATORY_CARE_PROVIDER_SITE_OTHER): Payer: Self-pay | Admitting: Orthopaedic Surgery of the Spine

## 2024-09-01 NOTE — Telephone Encounter (Signed)
 Name of caller: [place]  Relationship to patient:Self  Interpreter needed:No    Reason for call: Requesting to covert in person appointment to [facility].   Pt would like to convert her appt with Dr. JENEANE to a [facility]. She's currently scheduled for an in-clinic appt with him on 2/18 @[facility] .    Please f/u with Pt and advise.      LOV Date & Plan:  08/14/24 - no notes        Best number to be reached at:    [phone]      Route to RN pool if requesting clinical advice, it can take 24-48 hours turnaround time, route to MA if administrative advice it can take 3-5 business days turn around time.     If an action is needed from the call center please route to [facility] Scheduling Pool (not directly to cc staff)

## 2024-09-11 ENCOUNTER — Ambulatory Visit: Admitting: Orthopaedic Surgery of the Spine

## 2024-09-17 ENCOUNTER — Encounter (INDEPENDENT_AMBULATORY_CARE_PROVIDER_SITE_OTHER): Payer: Self-pay | Admitting: Orthopaedic Surgery of the Spine

## 2024-09-21 ENCOUNTER — Encounter (INDEPENDENT_AMBULATORY_CARE_PROVIDER_SITE_OTHER): Payer: Self-pay | Admitting: Hospital

## 2024-09-21 NOTE — Telephone Encounter (Signed)
 Patient is calling regarding below TE requesting if appt for 10/18/24 at 11:40 am  with Dr JENEANE can please be change to a [facility]      Please assist, thank you

## 2024-10-18 ENCOUNTER — Encounter (INDEPENDENT_AMBULATORY_CARE_PROVIDER_SITE_OTHER): Admitting: Orthopaedic Surgery of the Spine

## 2024-10-25 ENCOUNTER — Encounter (INDEPENDENT_AMBULATORY_CARE_PROVIDER_SITE_OTHER): Admitting: Orthopaedic Surgery of the Spine

## 2024-10-31 ENCOUNTER — Ambulatory Visit: Admitting: Neurological Surgery

## 2024-10-31 ENCOUNTER — Ambulatory Visit (HOSPITAL_BASED_OUTPATIENT_CLINIC_OR_DEPARTMENT_OTHER): Admitting: Neurological Surgery
